# Patient Record
Sex: Female | Born: 1965
Health system: Southern US, Community
[De-identification: ages and names within clinical notes are randomized; demographics above are authoritative.]

## PROBLEM LIST (undated history)

## (undated) DIAGNOSIS — L0292 Furuncle, unspecified: Secondary | ICD-10-CM

## (undated) DIAGNOSIS — L732 Hidradenitis suppurativa: Secondary | ICD-10-CM

## (undated) DIAGNOSIS — F79 Unspecified intellectual disabilities: Secondary | ICD-10-CM

## (undated) DIAGNOSIS — K219 Gastro-esophageal reflux disease without esophagitis: Secondary | ICD-10-CM

## (undated) DIAGNOSIS — N938 Other specified abnormal uterine and vaginal bleeding: Secondary | ICD-10-CM

## (undated) DIAGNOSIS — B370 Candidal stomatitis: Secondary | ICD-10-CM

## (undated) DIAGNOSIS — E099 Drug or chemical induced diabetes mellitus without complications: Secondary | ICD-10-CM

## (undated) DIAGNOSIS — T380X5A Adverse effect of glucocorticoids and synthetic analogues, initial encounter: Secondary | ICD-10-CM

## (undated) HISTORY — PX: TONSILLECTOMY AND ADENOIDECTOMY: SHX28

## (undated) HISTORY — PX: ABDOMINAL HYSTERECTOMY: SHX81

## (undated) HISTORY — DX: Furuncle, unspecified: L02.92

## (undated) HISTORY — DX: Other specified abnormal uterine and vaginal bleeding: N93.8

## (undated) HISTORY — PX: HYDRADENITIS EXCISION: SHX5243

---

## 1998-12-31 ENCOUNTER — Emergency Department (HOSPITAL_COMMUNITY): Admission: EM | Admit: 1998-12-31 | Discharge: 1998-12-31 | Payer: Self-pay | Admitting: Internal Medicine

## 1999-07-21 ENCOUNTER — Other Ambulatory Visit: Admission: RE | Admit: 1999-07-21 | Discharge: 1999-07-21 | Payer: Self-pay | Admitting: Family Medicine

## 1999-12-30 ENCOUNTER — Inpatient Hospital Stay (HOSPITAL_COMMUNITY): Admission: AD | Admit: 1999-12-30 | Discharge: 1999-12-30 | Payer: Self-pay | Admitting: Obstetrics & Gynecology

## 1999-12-30 ENCOUNTER — Encounter: Payer: Self-pay | Admitting: Obstetrics

## 1999-12-30 ENCOUNTER — Encounter (INDEPENDENT_AMBULATORY_CARE_PROVIDER_SITE_OTHER): Payer: Self-pay | Admitting: Specialist

## 2000-01-12 ENCOUNTER — Encounter: Admission: RE | Admit: 2000-01-12 | Discharge: 2000-01-12 | Payer: Self-pay | Admitting: Obstetrics

## 2000-04-05 ENCOUNTER — Encounter: Admission: RE | Admit: 2000-04-05 | Discharge: 2000-04-05 | Payer: Self-pay | Admitting: Obstetrics

## 2000-06-09 ENCOUNTER — Emergency Department (HOSPITAL_COMMUNITY): Admission: EM | Admit: 2000-06-09 | Discharge: 2000-06-09 | Payer: Self-pay | Admitting: Emergency Medicine

## 2000-07-24 ENCOUNTER — Encounter: Admission: RE | Admit: 2000-07-24 | Discharge: 2000-07-24 | Payer: Self-pay | Admitting: Obstetrics & Gynecology

## 2000-08-27 ENCOUNTER — Ambulatory Visit (HOSPITAL_COMMUNITY): Admission: RE | Admit: 2000-08-27 | Discharge: 2000-08-27 | Payer: Self-pay | Admitting: Obstetrics & Gynecology

## 2000-10-26 ENCOUNTER — Encounter: Admission: RE | Admit: 2000-10-26 | Discharge: 2000-10-26 | Payer: Self-pay | Admitting: Obstetrics & Gynecology

## 2001-09-25 ENCOUNTER — Other Ambulatory Visit: Admission: RE | Admit: 2001-09-25 | Discharge: 2001-09-25 | Payer: Self-pay | Admitting: Obstetrics & Gynecology

## 2001-10-02 ENCOUNTER — Encounter: Payer: Self-pay | Admitting: Obstetrics & Gynecology

## 2001-10-02 ENCOUNTER — Ambulatory Visit (HOSPITAL_COMMUNITY): Admission: RE | Admit: 2001-10-02 | Discharge: 2001-10-02 | Payer: Self-pay | Admitting: Obstetrics & Gynecology

## 2003-05-19 ENCOUNTER — Ambulatory Visit (HOSPITAL_COMMUNITY): Admission: RE | Admit: 2003-05-19 | Discharge: 2003-05-19 | Payer: Self-pay | Admitting: Obstetrics & Gynecology

## 2003-11-13 ENCOUNTER — Ambulatory Visit (HOSPITAL_COMMUNITY): Admission: RE | Admit: 2003-11-13 | Discharge: 2003-11-13 | Payer: Self-pay | Admitting: Gastroenterology

## 2003-11-30 ENCOUNTER — Ambulatory Visit: Payer: Self-pay | Admitting: Family Medicine

## 2004-04-14 ENCOUNTER — Ambulatory Visit: Payer: Self-pay | Admitting: Oncology

## 2004-08-19 ENCOUNTER — Ambulatory Visit: Payer: Self-pay | Admitting: Oncology

## 2004-12-25 ENCOUNTER — Ambulatory Visit: Payer: Self-pay | Admitting: Oncology

## 2005-03-24 ENCOUNTER — Ambulatory Visit: Payer: Self-pay | Admitting: Oncology

## 2005-03-30 ENCOUNTER — Encounter (INDEPENDENT_AMBULATORY_CARE_PROVIDER_SITE_OTHER): Payer: Self-pay | Admitting: Specialist

## 2005-03-30 ENCOUNTER — Inpatient Hospital Stay (HOSPITAL_COMMUNITY): Admission: RE | Admit: 2005-03-30 | Discharge: 2005-04-02 | Payer: Self-pay | Admitting: Obstetrics & Gynecology

## 2005-05-03 ENCOUNTER — Ambulatory Visit (HOSPITAL_BASED_OUTPATIENT_CLINIC_OR_DEPARTMENT_OTHER): Admission: RE | Admit: 2005-05-03 | Discharge: 2005-05-03 | Payer: Self-pay | Admitting: General Surgery

## 2005-05-03 ENCOUNTER — Encounter (INDEPENDENT_AMBULATORY_CARE_PROVIDER_SITE_OTHER): Payer: Self-pay | Admitting: *Deleted

## 2005-06-22 ENCOUNTER — Ambulatory Visit: Payer: Self-pay | Admitting: Oncology

## 2005-06-27 LAB — CBC WITH DIFFERENTIAL/PLATELET
BASO%: 0.2 % (ref 0.0–2.0)
Basophils Absolute: 0 10*3/uL (ref 0.0–0.1)
EOS%: 0.4 % (ref 0.0–7.0)
HCT: 35.2 % (ref 34.8–46.6)
HGB: 11.8 g/dL (ref 11.6–15.9)
LYMPH%: 25.5 % (ref 14.0–48.0)
MCH: 27.9 pg (ref 26.0–34.0)
MCHC: 33.7 g/dL (ref 32.0–36.0)
MONO#: 0.2 10*3/uL (ref 0.1–0.9)
MONO%: 4.2 % (ref 0.0–13.0)
Platelets: 308 10*3/uL (ref 145–400)
RBC: 4.25 10*6/uL (ref 3.70–5.32)

## 2005-07-28 ENCOUNTER — Encounter: Admission: RE | Admit: 2005-07-28 | Discharge: 2005-07-28 | Payer: Self-pay | Admitting: General Surgery

## 2005-09-14 ENCOUNTER — Ambulatory Visit (HOSPITAL_COMMUNITY): Admission: RE | Admit: 2005-09-14 | Discharge: 2005-09-14 | Payer: Self-pay | Admitting: General Surgery

## 2005-09-14 ENCOUNTER — Encounter (INDEPENDENT_AMBULATORY_CARE_PROVIDER_SITE_OTHER): Payer: Self-pay | Admitting: Specialist

## 2005-12-22 ENCOUNTER — Ambulatory Visit: Payer: Self-pay | Admitting: Oncology

## 2005-12-26 LAB — CBC WITH DIFFERENTIAL/PLATELET
LYMPH%: 20.1 % (ref 14.0–48.0)
MONO#: 0.2 10*3/uL (ref 0.1–0.9)
NEUT%: 74.8 % (ref 39.6–76.8)
RBC: 4.32 10*6/uL (ref 3.70–5.32)
RDW: 13.8 % (ref 11.3–14.5)
WBC: 5.2 10*3/uL (ref 3.9–10.0)

## 2006-06-21 ENCOUNTER — Ambulatory Visit: Payer: Self-pay | Admitting: Oncology

## 2006-06-26 LAB — CBC WITH DIFFERENTIAL/PLATELET
EOS%: 0.6 % (ref 0.0–7.0)
Eosinophils Absolute: 0 10*3/uL (ref 0.0–0.5)
HCT: 34.9 % (ref 34.8–46.6)
HGB: 12.3 g/dL (ref 11.6–15.9)
MCH: 28.9 pg (ref 26.0–34.0)
MCHC: 35.3 g/dL (ref 32.0–36.0)
MONO#: 0.2 10*3/uL (ref 0.1–0.9)
MONO%: 5.3 % (ref 0.0–13.0)
NEUT#: 3.2 10*3/uL (ref 1.5–6.5)
Platelets: 283 10*3/uL (ref 145–400)
WBC: 4.6 10*3/uL (ref 3.9–10.0)
lymph#: 1.1 10*3/uL (ref 0.9–3.3)

## 2006-08-29 ENCOUNTER — Encounter: Admission: RE | Admit: 2006-08-29 | Discharge: 2006-08-29 | Payer: Self-pay | Admitting: Family Medicine

## 2006-09-06 ENCOUNTER — Encounter: Admission: RE | Admit: 2006-09-06 | Discharge: 2006-09-06 | Payer: Self-pay | Admitting: Family Medicine

## 2006-12-20 ENCOUNTER — Ambulatory Visit: Payer: Self-pay | Admitting: Oncology

## 2006-12-25 LAB — CBC WITH DIFFERENTIAL/PLATELET
HCT: 35.1 % (ref 34.8–46.6)
HGB: 12.3 g/dL (ref 11.6–15.9)
LYMPH%: 24.2 % (ref 14.0–48.0)
MCHC: 34.9 g/dL (ref 32.0–36.0)
MCV: 82.9 fL (ref 81.0–101.0)
MONO%: 5.7 % (ref 0.0–13.0)
lymph#: 1.2 10*3/uL (ref 0.9–3.3)

## 2007-03-23 ENCOUNTER — Emergency Department (HOSPITAL_COMMUNITY): Admission: EM | Admit: 2007-03-23 | Discharge: 2007-03-23 | Payer: Self-pay | Admitting: Emergency Medicine

## 2007-06-08 ENCOUNTER — Emergency Department (HOSPITAL_COMMUNITY): Admission: EM | Admit: 2007-06-08 | Discharge: 2007-06-08 | Payer: Self-pay | Admitting: Emergency Medicine

## 2007-06-19 ENCOUNTER — Ambulatory Visit: Payer: Self-pay | Admitting: Oncology

## 2007-09-10 ENCOUNTER — Encounter: Admission: RE | Admit: 2007-09-10 | Discharge: 2007-09-10 | Payer: Self-pay | Admitting: Family Medicine

## 2008-06-19 ENCOUNTER — Ambulatory Visit: Payer: Self-pay | Admitting: Oncology

## 2008-06-23 LAB — CBC WITH DIFFERENTIAL/PLATELET
EOS%: 1.1 % (ref 0.0–7.0)
Eosinophils Absolute: 0.1 10*3/uL (ref 0.0–0.5)
HCT: 38.1 % (ref 34.8–46.6)
HGB: 12.9 g/dL (ref 11.6–15.9)
LYMPH%: 24.8 % (ref 14.0–49.7)
MCH: 28.6 pg (ref 25.1–34.0)
MCHC: 33.9 g/dL (ref 31.5–36.0)
NEUT%: 68.9 % (ref 38.4–76.8)
Platelets: 272 10*3/uL (ref 145–400)
RBC: 4.52 10*6/uL (ref 3.70–5.45)
RDW: 14.1 % (ref 11.2–14.5)
WBC: 4.8 10*3/uL (ref 3.9–10.3)
lymph#: 1.2 10*3/uL (ref 0.9–3.3)

## 2008-09-10 ENCOUNTER — Encounter: Admission: RE | Admit: 2008-09-10 | Discharge: 2008-09-10 | Payer: Self-pay | Admitting: Family Medicine

## 2009-05-26 ENCOUNTER — Encounter: Admission: RE | Admit: 2009-05-26 | Discharge: 2009-05-26 | Payer: Self-pay | Admitting: Occupational Medicine

## 2009-06-21 ENCOUNTER — Ambulatory Visit: Payer: Self-pay | Admitting: Oncology

## 2009-06-22 LAB — CBC WITH DIFFERENTIAL/PLATELET
BASO%: 0.4 % (ref 0.0–2.0)
Basophils Absolute: 0 10*3/uL (ref 0.0–0.1)
HGB: 12.9 g/dL (ref 11.6–15.9)
MCH: 28.7 pg (ref 25.1–34.0)
MCHC: 33.7 g/dL (ref 31.5–36.0)
MCV: 85.3 fL (ref 79.5–101.0)
Platelets: 280 10*3/uL (ref 145–400)
RDW: 14.2 % (ref 11.2–14.5)
WBC: 4.8 10*3/uL (ref 3.9–10.3)

## 2009-09-20 ENCOUNTER — Encounter: Admission: RE | Admit: 2009-09-20 | Discharge: 2009-09-20 | Payer: Self-pay | Admitting: Oncology

## 2009-09-20 ENCOUNTER — Encounter: Payer: Self-pay | Admitting: Internal Medicine

## 2009-09-28 ENCOUNTER — Encounter: Payer: Self-pay | Admitting: Internal Medicine

## 2009-09-28 ENCOUNTER — Ambulatory Visit: Payer: Self-pay | Admitting: Internal Medicine

## 2009-09-28 DIAGNOSIS — L0292 Furuncle, unspecified: Secondary | ICD-10-CM | POA: Insufficient documentation

## 2009-09-28 DIAGNOSIS — L0293 Carbuncle, unspecified: Secondary | ICD-10-CM

## 2009-09-28 DIAGNOSIS — R6250 Unspecified lack of expected normal physiological development in childhood: Secondary | ICD-10-CM | POA: Insufficient documentation

## 2009-09-28 DIAGNOSIS — E669 Obesity, unspecified: Secondary | ICD-10-CM

## 2009-09-28 DIAGNOSIS — D500 Iron deficiency anemia secondary to blood loss (chronic): Secondary | ICD-10-CM | POA: Insufficient documentation

## 2009-09-28 DIAGNOSIS — D573 Sickle-cell trait: Secondary | ICD-10-CM

## 2009-10-06 ENCOUNTER — Ambulatory Visit: Payer: Self-pay | Admitting: Internal Medicine

## 2009-10-06 LAB — CONVERTED CEMR LAB
BUN: 8 mg/dL (ref 6–23)
Calcium: 8.7 mg/dL (ref 8.4–10.5)
Chloride: 102 meq/L (ref 96–112)
Cholesterol: 163 mg/dL (ref 0–200)
HCT: 38.4 % (ref 36.0–46.0)
HDL: 36 mg/dL — ABNORMAL LOW (ref >39)
Hemoglobin: 12.7 g/dL (ref 12.0–15.0)
MCHC: 33.1 g/dL (ref 30.0–36.0)
Platelets: 269 10*3/uL (ref 150–400)
Sodium: 137 meq/L (ref 135–145)
TSH: 1.08 microintl units/mL (ref 0.350–4.5)
Total CHOL/HDL Ratio: 4.5
Triglycerides: 73 mg/dL (ref <150)
VLDL: 15 mg/dL (ref 0–40)

## 2009-11-16 ENCOUNTER — Ambulatory Visit: Payer: Self-pay | Admitting: Internal Medicine

## 2009-11-16 LAB — CONVERTED CEMR LAB: Hgb A1c MFr Bld: 7.1 %

## 2009-11-30 ENCOUNTER — Encounter: Payer: Self-pay | Admitting: Internal Medicine

## 2009-11-30 ENCOUNTER — Ambulatory Visit: Payer: Self-pay | Admitting: Internal Medicine

## 2009-11-30 DIAGNOSIS — E11319 Type 2 diabetes mellitus with unspecified diabetic retinopathy without macular edema: Secondary | ICD-10-CM

## 2009-11-30 LAB — CONVERTED CEMR LAB: Blood Glucose, Fingerstick: 118

## 2009-12-09 ENCOUNTER — Encounter: Payer: Self-pay | Admitting: Internal Medicine

## 2010-02-20 ENCOUNTER — Encounter: Payer: Self-pay | Admitting: Oncology

## 2010-02-20 ENCOUNTER — Encounter: Payer: Self-pay | Admitting: Family Medicine

## 2010-03-03 NOTE — Assessment & Plan Note (Signed)
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

## 2010-03-03 NOTE — Miscellaneous (Signed)
Summary: PATIENT CONSENT FORM  PATIENT CONSENT FORM   Imported By: Louretta Parma 10/01/2009 16:33:05  _____________________________________________________________________  External Attachment:    Type:   Image     Comment:   External Document

## 2010-03-03 NOTE — Letter (Signed)
Summary: [facility]  [facility]   Imported By: Shon Hough 12/21/2009 11:50:01  _____________________________________________________________________  External Attachment:    Type:   Image     Comment:   External Document

## 2010-03-03 NOTE — Assessment & Plan Note (Signed)
Summary: CHECKUP/SB.   Vital Signs:  Patient profile:   45 year old female Height:      63.5 inches (161.29 cm) Weight:      197.8 pounds (89.41 kg) BMI:     34.42 Temp:     98.6 degrees F (37.00 degrees C) oral Pulse rate:   91 / minute BP sitting:   124 / 84  (left arm)  Vitals Entered By: Theotis Barrio NT II (November 16, 2009 2:14 PM) CC: PATIENT IS HERE WITH HER MOTHER FOR FOLLOW UP ON LAB WORK Is Patient Diabetic? No Pain Assessment Patient in pain? no      Nutritional Status BMI of > 30 = obese  Have you ever been in a relationship where you felt threatened, hurt or afraid?No   Does patient need assistance? Functional Status Self care Ambulation Normal   CC:  PATIENT IS HERE WITH HER MOTHER FOR FOLLOW UP ON LAB WORK.  History of Present Illness: Follow up on labs. Patient is brought in by her mother. Patient states that she has polyuri and urgency for a couple of months. Denies any dysuria, fever, chills, polyphagia, increased thirst or weight fluctuation. FMHx: mother with DM, type 2. Denies any dizziness, syncope, or any other Sx.  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 Single No children No sexual activity No nicotine, no alcohol, no illicit drugs. Lives with her mother.  Risk Factors: Smoking Status: never (11/16/2009)  Past medical, surgical, family and social histories (including risk factors) reviewed for relevance to current acute and chronic problems.  Past Surgical History: Reviewed history from 09/28/2009 and no changes required. TAH due to DUB in 2007 a(ovaries intact). Tonsilectomy/adenoidectomy in early childhood. Multiple furuncles I&D's  Family History: Reviewed history from 09/28/2009 and no changes required. Mother -breast cancer Dx-ed at 79 y/o; HTN; DM type 2 Father -lung cancer Dx-ed at 14 y/o (smoker) Sickle cell trait  Social History: Reviewed history from 09/28/2009 and no changes required. 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 Single No children No sexual activity No nicotine, no alcohol, no illicit drugs. Lives with her mother.  Review of Systems  The patient denies anorexia, fever, weight loss, weight gain, vision loss, decreased hearing, hoarseness, chest pain, syncope, dyspnea on exertion, peripheral edema, prolonged cough, headaches, hemoptysis, abdominal pain, severe indigestion/heartburn, hematuria, incontinence, muscle weakness, suspicious skin lesions, depression, unusual weight change, abnormal bleeding, and breast masses.    Physical Exam  General:  alert, well-developed, well-nourished, well-hydrated, appropriate dress, normal appearance, cooperative to examination, good hygiene, and overweight-appearing.  alert, well-developed, well-nourished, well-hydrated, appropriate dress, normal appearance, cooperative to  examination, and good hygiene.   Head:  normocephalic, atraumatic, and no abnormalities observed.   Eyes:  vision grossly intact and no injection.   Ears:  R ear normal and L ear normal.   Nose:  no external deformity.   Mouth:  good dentition, no gingival abnormalities, no dental plaque, and pharynx pink and moist.  good dentition, no gingival abnormalities, no dental plaque, and pharynx pink and moist.   Neck:  supple and no masses.   Chest Wall:  no deformities and no tenderness.   Lungs:  normal respiratory effort and normal breath sounds.   Heart:  normal rate, regular rhythm, and no murmur.   Abdomen:  soft, non-tender, normal bowel sounds, and no rebound tenderness.   Msk:  normal ROM.   Pulses:  pedal pulses 2+/4 bilatearally Neurologic:  alert & oriented X3.   Skin:  turgor normal, color normal, no rashes, and no suspicious lesions.   Psych:  Oriented X3, memory intact for recent and remote, normally interactive, good eye contact, not anxious appearing, and not depressed appearing.     Impression & Recommendations:  Problem # 1:  HYPERGLYCEMIA, FASTING (ICD-790.29) Assessment New HgbA1C returned at 7.1. will implement a diet and weight management regimen. Patient is to see Ms. Victory Dakin for DME/nutrition counselling. No oral hypoglycemics yet. Will follow up in 8-12 weeks.  Labs Reviewed: Creat: 0.52 (10/06/2009)     Problem # 2:  OBESITY, UNSPECIFIED (ICD-278.00) Assessment: Unchanged  Risks of obesity explained to the patient and her mother. Diet, exercise thoroughly discussed.Referred for free nutrition classes with Ms.Rriley.  Ht: 63.5 (11/16/2009)   Wt: 197.8 (11/16/2009)   BMI: 34.42 (11/16/2009)  Complete Medication List: 1)  Aleve 220 Mg Tabs (Naproxen sodium) .... Take one tablet two times a day as needed  Other Orders: Mammogram (Screening) (Mammo) T-Hgb A1C (in-house) [id number])    Orders Added: 1)  Mammogram (Screening) [Mammo] 2)  T-Hgb A1C (in-house)  [[id number]] 3)  Est. Patient Level III [45409]     Prevention & Chronic Care Immunizations   Influenza vaccine: Fluvax Non-MCR  (09/28/2009)   Influenza vaccine deferral: Not indicated  (11/16/2009)   Influenza vaccine due: 10/01/2010    Tetanus booster: 09/28/2009: Tdap   Td booster deferral: Not indicated  (11/16/2009)   Tetanus booster due: 09/29/2019    Pneumococcal vaccine: Not documented  Other Screening   Pap smear: Not documented   Pap smear action/deferral: Not indicated S/P hysterectomy  (09/28/2009)    Mammogram: Not documented   Mammogram action/deferral: Ordered  (11/16/2009)   Smoking status: never  (11/16/2009)  Diabetes Mellitus   HgbA1C: 7.1  (11/16/2009)    Eye exam: Not documented    Foot exam: Not documented   High risk foot: Not documented   Foot care education: Not documented    Urine microalbumin/creatinine ratio: Not documented  Lipids   Total Cholesterol: 163  (10/06/2009)   Lipid panel action/deferral: Lipid Panel ordered   LDL: 112  (10/06/2009)   LDL Direct: Not documented   HDL: 36  (10/06/2009)   Triglycerides: 73  (10/06/2009)   Lipid panel due: 09/29/2010  Self-Management Support :    Diabetes self-management support: Resources for patients handout  (09/28/2009)   Nursing Instructions: Schedule screening mammogram (see order)   Laboratory Results   Blood Tests   Date/Time Received: November 16, 2009 3:12 PM  Date/Time Reported: Burke Keels  November 16, 2009 3:12 PM   HGBA1C: 7.1%   (Normal Range: Non-Diabetic - 3-6%   Control Diabetic - 6-8%)

## 2010-03-03 NOTE — Assessment & Plan Note (Signed)
Summary: NEW REFERRED DR. SHERRILL RCC/DS   Vital Signs:  Patient profile:   45 year old female Height:      63.5 inches (161.29 cm) Weight:      196.7 pounds (89.41 kg) BMI:     34.42 Temp:     98.6 degrees F oral Pulse rate:   94 / minute BP sitting:   123 / 76  (right arm) Cuff size:   large  Vitals Entered By: Chinita Pester RN (September 28, 2009 3:13 PM) CC: New to clinic.  Needs primary MD.   Check-up. Is Patient Diabetic? No Pain Assessment Patient in pain? no      Nutritional Status BMI of > 30 = obese  Have you ever been in a relationship where you felt threatened, hurt or afraid?Unable to ask; somone w/pt.   Does patient need assistance? Functional Status Self care Ambulation Normal Comments Mother w/pt.   CC:  New to clinic.  Needs primary MD.   Check-up.Marland Kitchen  History of Present Illness: Patient brought in by her mother "to establish her care." C/o "boils" which  recurrent in abdominal skin folds. Hxof I&D.Patient denies any known hx of MRSA.She denies any pain,only "soreness,"no fever, chills or drainage. Denies any other concerns.  Depression History:      The patient denies a depressed mood most of the day and a diminished interest in her usual daily activities.         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 Single No children No sexual activity No nicotine, no alcohol, no illicit drugs. Lives with her mother.  Risk Factors: Alcohol Use: 0 (09/28/2009) Exercise: no (09/28/2009)  Risk Factors: Smoking Status: never (09/28/2009)  Past Surgical History: TAH due to DUB in 2007 a(ovaries intact). Tonsilectomy/adenoidectomy in early childhood. Multiple furuncles I&D's  Family History: Reviewed history and no changes required. Mother -breast cancer Dx-ed at 10 y/o; HTN; DM type 2 Father -lung cancer Dx-ed at 79 y/o (smoker) Sickle cell trait  Social History: Reviewed history and no changes required. 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 Single No children No sexual activity No nicotine, no alcohol, no illicit drugs. Lives with her mother.Smoking Status:  never Does Patient Exercise:  no  Review of Systems  The patient denies anorexia, fever, weight loss, weight gain, vision loss, decreased hearing, hoarseness, chest pain, syncope, dyspnea on exertion, peripheral edema, prolonged cough, headaches, hemoptysis, abdominal pain, melena, hematochezia, severe indigestion/heartburn, hematuria, incontinence, genital sores, muscle weakness, suspicious skin lesions, transient blindness, difficulty walking, depression, unusual  weight change, abnormal bleeding, enlarged lymph nodes, angioedema, and breast masses.    Physical Exam  General:  alert, well-developed, well-nourished, well-hydrated, appropriate dress, normal appearance, cooperative to examination, good hygiene, and overweight-appearing.  alert,  well-developed, well-nourished, well-hydrated, appropriate dress, normal appearance, cooperative to examination, and good hygiene.   Head:  normocephalic, no abnormalities observed, and no abnormalities palpated.  normocephalic, no abnormalities observed, and no abnormalities palpated.   Eyes:  vision grossly intact, pupils equal, pupils round, pupils reactive to light, pupils react to accomodation, and corneas and lenses clear.  vision grossly intact, pupils equal, pupils round, pupils reactive to light, pupils react to accomodation, and corneas and lenses clear.   Ears:  R ear normal and L ear normal.  R ear normal and L ear normal.   Nose:  no external deformity, no nasal discharge, no mucosal pallor, no mucosal edema, and no nasal polyps.  no external deformity, no nasal discharge, no mucosal pallor, no mucosal edema, and no nasal polyps.   Mouth:  good dentition, no gingival abnormalities, no dental plaque, and pharynx pink and moist.  good dentition, no gingival abnormalities, no dental plaque, and pharynx pink and moist.   Neck:  supple, full ROM, and no masses.  supple, full ROM, and no masses.   Chest Wall:  no deformities.  no deformities.   Breasts:  deferred. Mammogram as of 8/22/2011WNL. Lungs:  Normal respiratory effort, chest expands symmetrically. Lungs are clear to auscultation, no crackles or wheezes. Heart:  Normal rate and regular rhythm. S1 and S2 normal without gallop, murmur, click, rub or other extra sounds. Abdomen:  Bowel sounds positive,abdomen soft and non-tender without masses, organomegaly or hernias noted. Msk:  No deformity or scoliosis noted of thoracic or lumbar spine.   Pulses:  R and L carotid,radial,femoral,dorsalis pedis and posterior tibial pulses are full and equal bilaterally Extremities:  No clubbing, cyanosis, edema, or deformity noted with normal full range of motion of all joints.   Neurologic:  No cranial nerve deficits noted. Station and gait are normal.  Plantar reflexes are down-going bilaterally. DTRs are symmetrical throughout. Sensory, motor and coordinative functions appear intact. Skin:  Left breast foldwith a 0.5cm indiamter hyperpigmented indurated papule; mildly TTP; no discharge; turgor normal, color normal, no rashes, and no edema.  turgor normal, color normal, no rashes, and no edema.   Cervical Nodes:  No lymphadenopathy noted Axillary Nodes:  No palpable lymphadenopathy Psych:  Cognition and judgment appear intact. Alert and cooperative with normal attention span and concentration. 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 Doxycyline by mouth for 10 days. Skin care discussed with the patient.  Problem # 2:  Preventive Health Care (ICD-V70.0) Assessment: Comment Only Weight management, diet,exercise, seat belt use discussed with the patient. Tdap and Influenzavaccination today. will erquest copy of a recent mammogram(FMHx of BCA). C-met, Lipids,CBC ordered.   Problem # 3:  FAMILY HISTORY OF DIABETES MELLITUS (ICD-V18.0) Assessment: New  Will check TSH and a B-met. Weight management discussed.   Orders: T-Basic Metabolic Panel [phone])  Complete Medication List: 1)  Aleve 220 Mg Tabs (Naproxen sodium) .... Take one tablet two times a day as needed 2)  Doxycycline Hyclate 100 Mg Caps (Doxycycline hyclate) .... Take 1 tablet by mouth two times a day with meals  Other Orders: T-Lipid Profile ([id number]) T-CBC No Diff ([id number]) T-TSH ([id number]) Influenza Vaccine NON MCR (27253) Tdap =>  67yrs IM [phone]) Admin 1st Vaccine (60454)  Patient Instructions: 1)  Please, return toclinic a for your blood work on an empty stomach. 2)  Please, pick up your antibiotic prescription at the [facility] pharmacy. 3)  Call with any questions. 4)  Please, follow up in  4-8weeks. Prescriptions: DOXYCYCLINE HYCLATE 100 MG CAPS (DOXYCYCLINE HYCLATE) Take 1 tablet by mouth two times a day with meals  #20 x 0   Entered and Authorized by:   Deatra Robinson MD   Signed by:   Deatra Robinson MD on 09/28/2009   Method used:   Electronically to        [facility] [address] [phone]* (retail)       [address] [place], [place]  [address]       Ph: [phone]       Fax: [phone]   RxID:   [id number]   Prevention & Chronic Care Immunizations   Influenza vaccine: Fluvax Non-MCR  (09/28/2009)    Tetanus booster: 09/28/2009: Tdap   Tetanus booster due: 09/29/2019    Pneumococcal vaccine: Not documented  Other Screening   Pap smear: Not documented   Pap smear action/deferral: Not indicated S/P hysterectomy  (09/28/2009)    Mammogram: Not documented  Reports requested:   Last mammogram report requested.  Smoking status: never  (09/28/2009)  Lipids   Total Cholesterol: Not documented   Lipid panel action/deferral: Lipid Panel ordered   LDL: Not documented   LDL Direct: Not documented   HDL: Not documented   Triglycerides: Not documented   Lipid panel due: 09/29/2010      Resource handout printed.   Nursing Instructions: Give Flu vaccine today Give tetanus booster today Request report of last mammogram   Process Orders Check Orders Results:     [facility]: ABN not required for this insurance Tests Sent for requisitioning (September 30, 2009 8:31 AM):     09/28/2009: [facility] -- T-Lipid Profile [phone] (signed)     09/28/2009: [facility] -- T-CBC No Diff [[id number]] (signed)     09/28/2009: [facility] -- T-Basic Metabolic Panel [id number] (signed)     09/28/2009: [facility] -- T-Basic Metabolic Panel [phone] (signed)     09/28/2009: [facility] -- T-TSH [phone] (signed)     Tetanus/Td  Vaccine    Vaccine Type: Tdap    Site: right deltoid    Mfr: GlaxoSmithKline    Dose: 0.5 ml    Route: IM    Given by: Chinita Pester RN    Exp. Date: 04/24/2011    Lot #: [id number]    VIS given: 12/18/06 version given September 28, 2009.  Influenza Vaccine    Vaccine Type: Fluvax Non-MCR    Site: left deltoid    Mfr: GlaxoSmithKline    Dose: 0.5 ml    Route: IM    Given by: Chinita Pester RN    Exp. Date: 07/30/2010    Lot #: [id number]    VIS given: 08/23/06 version given September 28, 2009.  Flu Vaccine Consent Questions    Do you have a history of severe allergic reactions to this vaccine? no    Any prior history of allergic reactions to egg and/or gelatin? no    Do you have a sensitivity to the preservative Thimersol? no    Do you have a past history of Guillan-Barre Syndrome? no    Do you currently have an acute febrile illness?  no    Have you ever had a severe reaction to latex? no    Vaccine information given and explained to patient? yes    Are you currently pregnant? no

## 2010-03-03 NOTE — Assessment & Plan Note (Signed)
Summary: DM TEACHING/CH  CBG Result 118 Comments 3 hours after lunch   Allergies: No Known Drug Allergies   Complete Medication List: 1)  Aleve 220 Mg Tabs (Naproxen sodium) .... 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 water- used to drink sweet tea & fruit punch  Dinner # of Carbs/Grams baked meat, rice or potatoes and vegetables. water, diet soda Bedtime # of Carbs/Grams greek yogurt  Nutrition assessment ETOH : No How many meals per week do you eat away from home?   3-5 Who does the food shopping? Other- mother Who does the cooking?  Other- mother What beverages do you drink?  water-- just switched as above Biggest challenge to eating healthy: Skipping meals  Activity Limitations  Inadequate physical activity Diabetes Disease Process  Discussed today State own type of diabetes: Needs review/assistanceState diabetes is treated by meal plan-exercise-medication-monitoring-education: Needs  review/assistance Medications  Nutritional Management Identify what foods most often affect blood glucose: Needs review/assistanceVerbalize importance of controlling food portions: No knowledgeState importance of spacing and not omitting meals and snacks: No knowledgeState changes planned for home meals/snacks: Needs review/assistance Monitoring State purpose and frequency of monitoring BG-ketones-HgbA1C  : Needs review/assistancePerform glucose monitoring/ketone testing and record results correctly: Needs review/assistanceState target blood glucose and HgbA1C goals: Needs review/assistance Complications State the causes-signs and symptoms and prevention of Hyperglycemia: Needs review/assistance   Explain proper treatment of hyperglycemia: Needs review/assistance Exercise States importance of exercise: Needs review/assistanceStates effect of exercise on blood glucose: No knowledge Lifestyle changes:Goal setting and Problem solving State benefits of making appropriate lifestyle changes: Demonstrates competency   Identify lifestyle behaviors that need to change: Needs review/assistance   Identify risk factors that interfere with health: Needs review/assistance   Develop strategies to reduce risk factors: Needs review/assistance   Verbalize need for and frequency of health care follow-up: Needs review/assistance   Identify Family/SO role in managing diabetes: Needs review/assistance    Psychosocial Adjustment State three common feelings that might be experienced when learning to cope with diabetes: No knowledge   Identify two methods to cope with these feelings: No knowledge   Identify how stress affects diabetes & two sources of stress: Needs review/assistanceName two ways of obtaining support from family/friends: No knowledgeDiabetes Management Education Done: 11/30/2009    BEHAVIORAL GOALS INITIAL Incorporating appropriate nutritional management: eat something every morning         Diabetes Self Management Support: mother, family, clinic staff Follow-up:3-4 weeks

## 2010-05-25 ENCOUNTER — Encounter: Payer: Self-pay | Admitting: Internal Medicine

## 2010-06-17 NOTE — Op Note (Signed)
NAMEBEAULAH, Jaime [name]               ACCOUNT NO.:  [id number]   MEDICAL RECORD NO.:  [id number]          PATIENT TYPE:  AMB   LOCATION:  SDS                          FACILITY:  [facility]   PHYSICIAN:  Ollen Gross. Vernell Morgans, M.D. DATE OF BIRTH:  [birth year]   DATE OF PROCEDURE:  09/14/2005  DATE OF DISCHARGE:  09/14/2005                                 OPERATIVE REPORT   PREOPERATIVE DIAGNOSIS:  Sebaceous cyst from the left inframammary fold.   POSTOPERATIVE DIAGNOSIS:  Sebaceous cyst from the left inframammary fold.   PROCEDURE:  Excision of sebaceous cyst.   SURGEON:  Ollen Gross. Carolynne Edouard, M.D.   ANESTHESIA:  General via LMA.   PROCEDURE:  After informed consent was obtained, the patient was brought to  the operating room and placed in the supine position on the operating room  table.  After adequate induction of general anesthesia, the patient's left  breast area was prepped with Betadine and draped in the usual sterile  manner.  The area of the cyst was identifiable in the left inframammary  fold.  This area was infiltrated with 0.25% Marcaine with epinephrine.  An  elliptical incision was made around this area with a 15-blade knife.  The  area was removed sharply down to the subcutaneous fat, all done sharply with  the 15-blade knife.  Hemostasis was achieved using the Bovie electrocautery.  The specimen was sent to Pathology for further evaluation.  The wound was  then closed with a running 4-0 Monocryl subcuticular stitch.  Benzoin, Steri-  Strips and sterile dressings were applied.  The patient tolerated the  procedure well.  At the end of the case, all needle, sponge and instrument  counts were correct.  The patient was then awakened and taken to recovery  room in stable condition.      Ollen Gross. Vernell Morgans, M.D.  Electronically Signed     PST/MEDQ  D:  09/15/2005  T:  09/15/2005  Job:  [id number]

## 2010-06-17 NOTE — Op Note (Signed)
NAMETEONNA, COONAN               ACCOUNT NO.:  [id number]   MEDICAL RECORD NO.:  [id number]          PATIENT TYPE:  AMB   LOCATION:  ENDO                         FACILITY:  [facility]   PHYSICIAN:  Anselmo Rod, M.D.  DATE OF BIRTH:  [birth year]   DATE OF PROCEDURE:  11/13/2003  DATE OF DISCHARGE:                                 OPERATIVE REPORT   PROCEDURE:  Colonoscopy.   ENDOSCOPIST:  Charna Elizabeth, M.D.   INSTRUMENT USED:  Olympus video colonoscope.   INDICATIONS FOR PROCEDURE:  45 year old African American female with a  history of sickle cell trait and mild iron deficiency anemia undergoing  colonoscopy for rectal bleeding.  Rule out colonic polyps, masses, etc.   PREPROCEDURE PREPARATION:  Informed consent was procured from the patient.  The patient was fasted for eight hours prior to the procedure and prepped  with a bottle of magnesium citrate and a gallon of GoLYTELY the night prior  to the procedure.   PREPROCEDURE PHYSICAL:  Patient with stable vital signs.  Neck supple.  Chest clear to auscultation.  S1 and S2 regular.  Abdomen soft with normal  bowel sounds.   DESCRIPTION OF PROCEDURE:  The patient was placed in the left lateral  decubitus position, sedated with 80 mg of Demerol and 8 mg Versed in slow  incremental doses.  Once the patient was adequately sedated, maintained on  low flow oxygen and continuous cardiac monitoring, the Olympus video  colonoscope was advanced from the rectum to the cecum with difficulty.  There was a large amount of residual stool in the colon.  Multiple washes  were done.  Prominent anal papilla was seen on retroflexion.  Extrinsic  compression of the cecum was noted, the reason for this is not clear.  The  terminal ileum appeared heathy.  Clots were noted passing through the vagina  before starting the procedure.  No masses or polyps were identified.  The  patient tolerated the procedure well without complications.   IMPRESSION:  1.   Extrinsic compression of the cecum, cause unclear.  2.  Prominent anal papilla seen on retroflexion.  3.  Clots seen in the vaginal area.  4.  Large amount of residual stool in the colon, small lesions could have      been missed.  5.  Small anal fissure seen on anal inspection.   RECOMMENDATIONS:  1.  Continue care for anal fissure as advised in the office.  2.  CT scan of the abdomen and pelvis to further evaluate the extrinsic      compression on the cecum.  3.  Outpatient follow up in the next two weeks, earlier if need be.       JNM/MEDQ  D:  11/13/2003  T:  11/13/2003  Job:  [id number]   cc:   Leighton Roach. Truett Perna, M.D.  [address]- [facility]  [place]  [place] [address]  Fax: [phone]

## 2010-06-17 NOTE — H&P (Signed)
Jaime [name], Jaime [name]               ACCOUNT NO.:  [id number]   MEDICAL RECORD NO.:  [id number]          PATIENT TYPE:  INP   LOCATION:  NA                            [facility]   PHYSICIAN:  Roseanna Rainbow, M.D.DATE OF BIRTH:  10-15-1965   DATE OF ADMISSION:  DATE OF DISCHARGE:                                HISTORY & PHYSICAL   CHIEF COMPLAINT:  The patient is a 45 year old with symptomatic uterine  fibroids and secondary dysmenorrhea, who presents for total abdominal  hysterectomy, possible bilateral salpingo-oophorectomy.   HISTORY OF PRESENT ILLNESS:  The patient has a long history of menorrhagia.  She also has a history of secondary iron-deficiency anemia. Attempts have  been made to medically manage the patient with Depo-Provera. Work up has  included a pelvic ultrasound that demonstrated uterine fibroids. A  hemoglobin on February 21, 2005 was 9.9.   PAST OB/GYN HISTORY:  Please see the above. Last Pap smear in October of  2006 was within normal limits. She does have a history of an ASCIS Pap  smear. She is status post a bilateral tubal ligation.   PAST MEDICAL HISTORY:  Remarkable for mental retardation.   PAST SURGICAL HISTORY:  Tonsils and adenoids.   PAST OBSTETRICAL HISTORY:  She has been pregnant x2. She has had two  miscarriages.   SOCIAL HISTORY:  She is single. Employed as a Arboriculturist. Has no significant  smoking history. Does not give any significant history of alcohol usage. She  denies illicit drug use.   FAMILY HISTORY:  Mother with breast cancer, and adult onset diabetes  mellitus.   PHYSICAL EXAMINATION:  VITAL SIGNS:  Pulse 106, blood pressure 123/79.  GENERAL:  Well-developed African-American female in no apparent distress.  LUNGS:  Clear to auscultation bilaterally.  HEART:  Regular rate and rhythm.  ABDOMEN:  Soft, nontender.  PELVIC EXAM:  Normal EGBUS. Speculum - vagina is clean. Bimanual exam -  uterus is mildly enlarged, irregular and  mildly tender.   ASSESSMENT:  Symptomatic uterine fibroids refractory to attempts at medical  management. Also with secondary dysmenorrhea. Differential diagnosis  includes adenomyosis or possible endometriosis.   PLAN:  The planned procedure is total abdominal hysterectomy, possible  bilateral salpingo-oophorectomy if there is endometriosis or if there are  endometriotic implants found at the time of surgery. The risks, benefits and  alternative forms of management were reviewed with the patient and informed  consent has been obtained.      Roseanna Rainbow, M.D.  Electronically Signed     LAJ/MEDQ  D:  03/29/2005  T:  03/29/2005  Job:  [id number]

## 2010-06-17 NOTE — H&P (Signed)
[facility]  Patient:    Jaime [name], Jaime [name]                      MRN: [id number] Adm. Date:  [id number] Disc. Date: [id number] Attending:  Antionette Char                         History and Physical  CHIEF COMPLAINT:              The patient is a 45 year old para 1 who desires a sterilization procedure.  HISTORY OF PRESENT ILLNESS:  As above.  The risks, benefits and alternative forms of management were reviewed with the patient including, but not limited to, a failure rate of 4 to 8 per 1000 cases with a subsequent increased risk of an ectopic pregnancy.  MEDICATIONS:                  None.  ALLERGIES:                    None.  PAST OBSTETRICAL/GYNECOLOGICAL HISTORY:  She is status post one delivery and one spontaneous Ab.  MEDICAL HISTORY:              She has sickle cell trait.  PAST SURGICAL HISTORY:        Remarkable for a tonsillectomy.  SOCIAL HISTORY:               Moderately handicapped, slight mental retardation.  FAMILY HISTORY:               Breast cancer.  PHYSICAL EXAMINATION:  VITAL SIGNS:                  Temperature 97.9, pulse 80, respirations 20, blood pressure 123/64.  GENERAL APPEARANCE:           Well-developed, well-nourished, in no apparent distress.  HEENT:                        Normocephalic, atraumatic.  NECK:                         Supple.  LUNGS:                        Clear to auscultation.  HEART:                        Regular rate and rhythm.  ABDOMEN:                      Soft, nontender.  No organomegaly.  PELVIC:                       External female genitalia are normal appearing. On speculum exam, the vagina is clean.  Bimanual exam:  The uterus is small, anteverted and nontender.  The adnexa are nonpalpable, nontender.  ASSESSMENT:                   Primipara who desires sterilization procedure.  PLAN:                         Laparoscopic bilateral tubal ligation. DD:  09/10/00 TD:   09/10/00 Job: [id number]

## 2010-06-17 NOTE — Op Note (Signed)
Jaime [name], Jaime [name]               ACCOUNT NO.:  [id number]   MEDICAL RECORD NO.:  [id number]          PATIENT TYPE:  INP   LOCATION:  9399                          [facility]ILITY[facility]  [facility]   PHYSICIAN:  Roseanna Rainbow, M.D.DATE OF BIRTH:  1965-06-20   DATE OF PROCEDURE:  03/30/2005  DATE OF DISCHARGE:                                 OPERATIVE REPORT   PREOPERATIVE DIAGNOSIS:  Uterine fibroids with secondary menorrhagia, pelvic  pain, rule out adenomyosis.   POSTOPERATIVE DIAGNOSIS:  Uterine fibroids with secondary menorrhagia,  pelvic pain, rule out adenomyosis.   PROCEDURE:  Total abdominal hysterectomy.   SURGEON:  Roseanna Rainbow, M.D.   ASSISTANT:  Bing Neighbors. Clearance Coots, M.D.   ANESTHESIA:  General endotracheal.   PATHOLOGY:  Uterus and cervix.   ESTIMATED BLOOD LOSS:  150 mL.   COMPLICATIONS:  None.   IV fluids and urine output as per anesthesiology.   PROCEDURES:  The patient was taken to the operating room with IV running.  She was given general anesthesia and placed in a dorsal lithotomy position  and prepped and draped in the usual sterile fashion.  A Pfannenstiel skin  incision was then made with a scalpel, carried down to the underlying  fascia.  The fascia was nicked in the midline.  The fascial incision was  then extended bilaterally with curved Mayo scissors.  The superior aspect of  the fascial incision was then tented up and the underlying rectus muscles  dissected off.  The inferior aspect of the fascial incision was manipulated  in a similar fashion.  The rectus muscles were separated in the midline.  The parietal peritoneum was entered bluntly.  The parietal peritoneal  incision was then extended superiorly and inferiorly with good visualization  of the bladder.  An O'Connor-O'Sullivan retractor was then placed into the  incision.  The bowel was packed away with moistened laparotomy sponges.  There were some filmy adhesions involving the sigmoid colon  and the left  adnexa.  These were divided with cautery.  A survey of the pelvic anatomy  revealed the uterus was globular and boggy, approximately 12 cm in sagittal  diameter.  The ovaries and tubes were normal-appearing.  The peritoneal  surfaces were free of any endometriotic implants.  Long Kelly clamps were  placed on the cornua for retraction.  The round ligaments on both sides were  divided with the Bovie.  The peritoneal reflection was then divided  anteriorly with the Bovie.  The bladder was dissected off the lower uterine  segment and cervix.  The utero-ovarian ligament and fallopian tube were then  doubly clamped bilaterally and transected.  Free ligatures and suture  ligatures were placed.  The uterine vessels were then clamped and transected  and suture ligated bilaterally.  At this point the uterine fundus was  amputated above the uterine pedicle.  The cardinal ligaments and uterosacral  ligaments were clamped with parametrial clamps, transected and suture  ligated.  The vaginal cuff angles were transected and suture ligated as  well.  The cervix was amputated.  The remainder of the vaginal cuff  was then  suture ligated with interrupted sutures of 0 Vicryl.  The pelvis was  copiously irrigated.  Individual bleeding points were cauterized with the  Bovie.  Adequate hemostasis was assured.  The packing and retractor were  then removed.  The parietal peritoneum was reapproximated in a running  fashion using 2-0 Monocryl.  The fascia was reapproximated in a running  fashion using 0 PDS.  The skin was reapproximated with staples.  At the  close of the procedure, the instrument and pack counts were said to be  correct x2.  The patient was taken to the PACU awake and in stable  condition.      Roseanna Rainbow, M.D.  Electronically Signed     LAJ/MEDQ  D:  03/30/2005  T:  03/30/2005  Job:  [id number]

## 2010-06-17 NOTE — Discharge Summary (Signed)
Jaime [name], Jaime [name]               ACCOUNT NO.:  [id number]   MEDICAL RECORD NO.:  [id number]          PATIENT TYPE:  INP   LOCATION:  9310                          [facility]   PHYSICIAN:  Roseanna Rainbow, M.D.DATE OF BIRTH:  [birth year]   DATE OF ADMISSION:  03/30/2005  DATE OF DISCHARGE:  04/02/2005                                 DISCHARGE SUMMARY   CHIEF COMPLAINT:  The patient is a 45 year old with symptomatic uterine  fibroids secondary to dysmenorrhea, who presents for a total abdominal  hysterectomy and possible bilateral salpingo-oophorectomy.  Please see the  dictated history and physical for further details.   HOSPITAL COURSE:  The patient was admitted and underwent a total abdominal  hysterectomy.  Please see the dictated operative summary.  Her postoperative  course was uneventful.  She was discharged to home on postoperative day #3  tolerating a regular diet.   DISCHARGE DIAGNOSIS:  Uterine fibroids.   PROCEDURE:  Total abdominal hysterectomy.   CONDITION:  Good.   DIET:  Regular.   ACTIVITY:  Pelvic rest and progressive activity.   MEDICATIONS:  Tylox.   DISPOSITION:  The patient was to follow up in the office in 4 weeks.      Roseanna Rainbow, M.D.  Electronically Signed     LAJ/MEDQ  D:  04/20/2005  T:  04/20/2005  Job:  [id number]

## 2010-06-17 NOTE — Op Note (Signed)
[facility]  Patient:    Jaime [name], Jaime [name]                      MRN: [id number] Proc. Date: 08/27/00 Adm. Date:  [id number] Attending:  Antionette Char CC:         [facility]   Operative Report  PREOPERATIVE DIAGNOSIS:       Multiparity, desires permanent sterilization.  POSTOPERATIVE DIAGNOSES:      1. Multiparity, desires permanent sterilization.  OPERATION:                    Laparoscopic tubal ligation with bipolar cautery.  SURGEON:                      Roseanna Rainbow, M.D.  ANESTHESIA:                   General endotracheal.  COMPLICATIONS:                None.  ESTIMATED BLOOD LOSS:         Less than 25 cc.  FLUIDS:                       As per anesthesiology.  FINDINGS:                     At laparoscopy, the uterus appeared symmetrically enlarged, globular.  The remainder of the abdominopelvic viscera appeared grossly within normal limits.  DESCRIPTION OF PROCEDURE:     The patient was taken to the operating room where general anesthesia was obtained without difficulty.  The patient was examined under general anesthesia and found to have a small anteverted uterus with normal adnexa.  She was then placed in the dorsolithotomy position and prepped and draped in the usual sterile fashion.  Bivalve speculum was then placed in the patients vagina, and a Hulka probe and tenaculum were then advanced into the uterus and secured to the anterior lip of the cervix as means to manipulate the uterus.  The speculum was removed from the vagina.  Attention was then turned to the patients abdomen where the infraumbilical skin as infiltrated with 0.25% Marcaine solution.  A 10 mm skin incision was then made in the umbilical fold.  The Veress needle was then introduced into the abdominal cavity while tenting up the abdominal wall at a 45 degree angle. Intra-abdominal placement was confirmed by an appropriate drop in  CO2 gas and by instilling normal saline through the Veress needle.  A pneumoperitoneum was obtained with 4 liters of CO2 gas.  The trocar and sleeve were then advanced without difficulty into the abdomen where intra-abdominal placement was confirmed by the laparoscope.  Survey of the patients pelvis and abdomen revealed the above findings.  The bipolar cautery apparatus was then advanced through the operative port of the laparoscope.  The patients left fallopian tube was identified, followed out to the fimbriated end.  A 2 to 3 cm segment of tube approximately 4 cm from the uterine cornu was then cauterized contiguously.  With each application, the ohmmeter was noted to go to 0.  The right fallopian tube was manipulated in a similar fashion.  The instruments were then removed from the patients abdomen and the incision repaired with 0 Vicryl.   The dilator and tenaculum were then removed from the vagina with minimal bleeding noted from the  cervix.  The patient tolerated the procedure well.  Sponge, laparotomy pad, needle and instrument counts were correct x 2. She was taken to the PACU in stable condition. DD:  08/27/00 TD:  08/27/00 Job: [id number]

## 2010-06-17 NOTE — H&P (Signed)
[facility]  Patient:    Jaime [name], Jaime [name]                      MRN: [id number] Adm. Date:  [id number] Disc. Date: [id number] Attending:  Tobey Bride CC:         [facility]   History and Physical  CHIEF COMPLAINT:              The patient is a 45 year old para 0 who desires permanent sterilization procedure.  HISTORY OF PRESENT ILLNESS:   The patient has a history of Depo-Provera use.  ALLERGIES:                    No known drug allergies.  MEDICATIONS:                  None.  PAST GYNECOLOGICAL HISTORY:   She denies any sexually transmitted diseases. Pap smear by report in November 2001 was within normal limits.  PAST OBSTETRICAL HISTORY:     She has a history of one voluntary termination of pregnancy complicated by retained products of conception and a followup D&C.  PAST MEDICAL HISTORY:         Remarkable for slight mental retardation, but as per the patients mother she is competent.  PAST SURGICAL HISTORY:        Remarkable for a history of tonsillectomy.  PHYSICAL EXAMINATION  VITAL SIGNS:                  Pulse 83, blood pressure 115/84, weight 191.8 pounds, height 5 feet 3 inches.  GENERAL:                      Obese.  In no apparent distress.  HEENT:                        Normocephalic, atraumatic.  NECK:                         Supple.  LUNGS:                        Clear to auscultation bilaterally.  HEART:                        Regular rate and rhythm.  ABDOMEN:                      Obese, nontender, soft.  PELVIC:                       The external female genitalia are normal appearing.  On speculum examination there is scant heme.  No lesions noted. On bimanual examination the uterus is small, anteverted, nontender.  The adnexa are not palpable, nontender.  EXTREMITIES:                  No cyanosis, clubbing, or edema.  SKIN:                         Without rash.  ASSESSMENT:                    Nullipara who desires sterilization procedure. The risks, benefits, and alternative forms of management were reviewed with the  patient including, but not limited to, failure rate of 4-8 per 1000 with a subsequent increased risk of an ectopic gestation.  PLAN:                         Will check the Pap smear performed at the [facility] from November 2001.  Planned procedure is laparoscopic bilateral tubal ligation. DD:  07/24/00 TD:  07/24/00 Job: [id number]

## 2010-06-17 NOTE — Op Note (Signed)
NAMEPAYETON, Jaime [name]               ACCOUNT NO.:  [id number]   MEDICAL RECORD NO.:  [id number]          PATIENT TYPE:  AMB   LOCATION:  DSC                          FACILITY:  [facility]   PHYSICIAN:  Ollen Gross. Vernell Morgans, M.D. DATE OF BIRTH:  January 25, 1966   DATE OF PROCEDURE:  05/04/2005  DATE OF DISCHARGE:  05/03/2005                                 OPERATIVE REPORT   PREOPERATIVE DIAGNOSIS:  Mass of the right chest wall.   POSTOP DIAGNOSIS:  Sebaceous cyst the right chest wall.   PROCEDURE:  Excision of sebaceous cyst from right chest wall.   SURGEON:  Dr. Carolynne Edouard.   ANESTHESIA:  General via LMA.   PROCEDURE:  After informed consent was obtained, the patient was brought to  the operating room, placed in supine position on the table.  After induction  of general anesthesia, the patient's right chest wall was prepped with  Betadine and draped in usual sterile manner.  The area in question was  identified and elliptical incision was made with a 15 blade knife around  this area.  This incision was carried down through the skin and subcutaneous  tissue sharply with the electrocautery.  The wall of the sebaceous cyst was  excised also sharply with the specimen completely with the electrocautery.  Once the specimen was completely removed from the rest of subcutaneous  tissue.  The specimen was sent to pathology for further evaluation.  The  wound was then irrigated with copious amounts of saline.  Hemostasis was  achieved using Bovie electrocautery.  The wound was closed with a deep layer  of interrupted 3-0 Vicryl stitches and the skin was closed with a running 4-  0 Monocryl subcuticular stitch.  Benzoin, Steri-Strips and sterile dressings  were applied.  The patient tolerated well.  The wound was then infiltrated  also with 4% Marcaine with epinephrine and sterile dressings were applied.  The patient was awakened, taken to recovery in stable condition.      Ollen Gross. Vernell Morgans, M.D.  Electronically Signed     PST/MEDQ  D:  05/04/2005  T:  05/05/2005  Job:  [id number]

## 2010-07-04 ENCOUNTER — Other Ambulatory Visit: Payer: Self-pay | Admitting: Oncology

## 2010-07-04 ENCOUNTER — Encounter (HOSPITAL_BASED_OUTPATIENT_CLINIC_OR_DEPARTMENT_OTHER): Payer: Medicaid Other | Admitting: Oncology

## 2010-07-04 DIAGNOSIS — D573 Sickle-cell trait: Secondary | ICD-10-CM

## 2010-07-04 DIAGNOSIS — R1310 Dysphagia, unspecified: Secondary | ICD-10-CM

## 2010-07-04 DIAGNOSIS — D509 Iron deficiency anemia, unspecified: Secondary | ICD-10-CM

## 2010-07-04 LAB — CBC WITH DIFFERENTIAL/PLATELET
Basophils Absolute: 0 10*3/uL (ref 0.0–0.1)
EOS%: 1.3 % (ref 0.0–7.0)
Eosinophils Absolute: 0.1 10*3/uL (ref 0.0–0.5)
HGB: 12.4 g/dL (ref 11.6–15.9)
MONO%: 3.1 % (ref 0.0–14.0)
RBC: 4.42 10*6/uL (ref 3.70–5.45)
WBC: 4.8 10*3/uL (ref 3.9–10.3)
lymph#: 1.4 10*3/uL (ref 0.9–3.3)

## 2010-08-16 ENCOUNTER — Other Ambulatory Visit: Payer: Self-pay | Admitting: Gastroenterology

## 2010-08-23 ENCOUNTER — Ambulatory Visit
Admission: RE | Admit: 2010-08-23 | Discharge: 2010-08-23 | Disposition: A | Discharge status: Home / Self Care | Payer: Medicaid Other | Source: Ambulatory Visit | Attending: Gastroenterology | Admitting: Gastroenterology

## 2010-09-07 ENCOUNTER — Other Ambulatory Visit: Payer: Self-pay | Admitting: Oncology

## 2010-09-07 DIAGNOSIS — Z1231 Encounter for screening mammogram for malignant neoplasm of breast: Secondary | ICD-10-CM

## 2010-09-27 ENCOUNTER — Ambulatory Visit
Admission: RE | Admit: 2010-09-27 | Discharge: 2010-09-27 | Disposition: A | Discharge status: Home / Self Care | Payer: Medicaid Other | Source: Ambulatory Visit | Attending: Oncology | Admitting: Oncology

## 2010-09-27 DIAGNOSIS — Z1231 Encounter for screening mammogram for malignant neoplasm of breast: Secondary | ICD-10-CM

## 2010-10-04 ENCOUNTER — Other Ambulatory Visit: Payer: Self-pay | Admitting: Oncology

## 2010-10-04 ENCOUNTER — Encounter (HOSPITAL_BASED_OUTPATIENT_CLINIC_OR_DEPARTMENT_OTHER): Payer: Medicaid Other | Admitting: Oncology

## 2010-10-04 DIAGNOSIS — R1310 Dysphagia, unspecified: Secondary | ICD-10-CM

## 2010-10-04 DIAGNOSIS — D573 Sickle-cell trait: Secondary | ICD-10-CM

## 2010-10-04 DIAGNOSIS — D509 Iron deficiency anemia, unspecified: Secondary | ICD-10-CM

## 2010-10-04 LAB — CBC WITH DIFFERENTIAL/PLATELET
EOS%: 0.6 % (ref 0.0–7.0)
Eosinophils Absolute: 0 10*3/uL (ref 0.0–0.5)
HGB: 12.2 g/dL (ref 11.6–15.9)
LYMPH%: 33 % (ref 14.0–49.7)
MCHC: 34.9 g/dL (ref 31.5–36.0)
MONO#: 0.2 10*3/uL (ref 0.1–0.9)
MONO%: 5 % (ref 0.0–14.0)
RBC: 4.35 10*6/uL (ref 3.70–5.45)
RDW: 13.1 % (ref 11.2–14.5)
lymph#: 1.5 10*3/uL (ref 0.9–3.3)

## 2010-11-01 ENCOUNTER — Encounter: Payer: Self-pay | Admitting: Internal Medicine

## 2010-11-01 ENCOUNTER — Ambulatory Visit (INDEPENDENT_AMBULATORY_CARE_PROVIDER_SITE_OTHER): Payer: Medicaid Other | Admitting: Internal Medicine

## 2010-11-01 DIAGNOSIS — Z23 Encounter for immunization: Secondary | ICD-10-CM

## 2010-11-01 DIAGNOSIS — E119 Type 2 diabetes mellitus without complications: Secondary | ICD-10-CM

## 2010-11-01 LAB — GLUCOSE, CAPILLARY: Glucose-Capillary: 173 mg/dL — ABNORMAL HIGH (ref 70–99)

## 2010-11-01 LAB — POCT GLYCOSYLATED HEMOGLOBIN (HGB A1C): Hemoglobin A1C: 7.2

## 2010-11-01 MED ORDER — ACCU-CHEK FASTCLIX LANCETS MISC: 1.0000 | Status: DC

## 2010-11-01 MED ORDER — ACCU-CHEK NANO SMARTVIEW W/DEVICE KIT: 1.0000 | PACK | Status: DC

## 2010-11-01 MED ORDER — GLUCOSE BLOOD VI STRP: ORAL_STRIP | Status: DC

## 2010-11-01 NOTE — Patient Instructions (Signed)
Please, follow up with an eye doctor and ask him to send [place] a report. Please, follow up in 6 months.

## 2010-11-01 NOTE — Progress Notes (Signed)
  Subjective:    Patient ID: Jaime [name], female    DOB: 1965/03/14, 45 y.o.   MRN: [id number]  HPI  1. DM, type 2. Denies any hypoglycemic events or other concerns. 2. Flu shot.  Review of Systems  Constitutional: Negative.   HENT: Negative.   Eyes: Negative.   Respiratory: Negative.   Cardiovascular: Negative.   Gastrointestinal: Negative.   Genitourinary: Negative.   Musculoskeletal: Negative.   Skin: Negative.   Hematological: Negative.    Review of Systems  Constitutional: Negative.   HENT: Negative.   Eyes: Negative.   Respiratory: Negative.   Cardiovascular: Negative.   Gastrointestinal: Negative.   Genitourinary: Negative.   Musculoskeletal: Negative.   Skin: Negative.   Endo/Heme/Allergies: Negative.        Objective:   Physical Exam  Vitals: noted General: alert, well-developed, and cooperative to examination.  Head: normocephalic and atraumatic.  Eyes: vision grossly intact, pupils equal, pupils round, pupils reactive to light, no injection and anicteric.  Mouth: pharynx pink and moist, no erythema, and no exudates.  Neck: supple, full ROM, no thyromegaly, no JVD, and no carotid bruits.  Lungs: normal respiratory effort, no accessory muscle use, normal breath sounds, no crackles, and no wheezes. Heart: normal rate, regular rhythm, no murmur, no gallop, and no rub.  Abdomen: soft, non-tender, normal bowel sounds, no distention, no guarding, no rebound tenderness, no hepatomegaly, and no splenomegaly.  Msk: no joint swelling, no joint warmth, and no redness over joints.  Pulses: 2+ DP/PT pulses bilaterally Extremities: No cyanosis, clubbing, edema Neurologic: alert & oriented X3, cranial nerves II-XII intact, strength normal in all extremities, sensation intact to light touch, and gait normal.  Skin: turgor normal and no rashes.  Psych: Oriented X3, memory intact for recent and remote, normally interactive, good eye contact, not anxious appearing, and  not depressed appearing.         Assessment & Plan:  1. DM, type 2. No change of therapy at this time. Weight management addressed. Will check urine microalbumin today. Referred for an annual eye exam. Foot care reviewed with the patient. New glucometer Rx-ed. 2. Elevated BP. No history of HTN per chart review. Will recheck next visit and await results of a fundoscopic exam. 3. Pneumovax and fluvax today.

## 2010-11-02 LAB — MICROALBUMIN / CREATININE URINE RATIO
Creatinine, Urine: 102.4 mg/dL
Microalb, Ur: 1.11 mg/dL (ref 0.00–1.89)

## 2010-11-07 ENCOUNTER — Ambulatory Visit: Payer: Medicaid Other | Admitting: Dietician

## 2010-12-26 ENCOUNTER — Encounter (INDEPENDENT_AMBULATORY_CARE_PROVIDER_SITE_OTHER): Payer: Medicaid Other | Admitting: Ophthalmology

## 2010-12-26 DIAGNOSIS — H3530 Unspecified macular degeneration: Secondary | ICD-10-CM

## 2010-12-26 DIAGNOSIS — E1139 Type 2 diabetes mellitus with other diabetic ophthalmic complication: Secondary | ICD-10-CM

## 2010-12-26 DIAGNOSIS — E11319 Type 2 diabetes mellitus with unspecified diabetic retinopathy without macular edema: Secondary | ICD-10-CM

## 2010-12-26 DIAGNOSIS — H43819 Vitreous degeneration, unspecified eye: Secondary | ICD-10-CM

## 2011-03-27 ENCOUNTER — Telehealth: Payer: Self-pay | Admitting: Oncology

## 2011-03-27 NOTE — Telephone Encounter (Signed)
Primary phone d/c. lmonvm for pt re appts for 3/4 and 9/3. Schedule mailed today.

## 2011-04-03 ENCOUNTER — Other Ambulatory Visit (HOSPITAL_BASED_OUTPATIENT_CLINIC_OR_DEPARTMENT_OTHER): Payer: Medicaid Other | Admitting: Lab

## 2011-04-03 DIAGNOSIS — D573 Sickle-cell trait: Secondary | ICD-10-CM

## 2011-04-03 DIAGNOSIS — D509 Iron deficiency anemia, unspecified: Secondary | ICD-10-CM

## 2011-04-03 DIAGNOSIS — R1310 Dysphagia, unspecified: Secondary | ICD-10-CM

## 2011-04-03 LAB — FERRITIN: Ferritin: 103 ng/mL (ref 10–291)

## 2011-04-03 LAB — CBC WITH DIFFERENTIAL/PLATELET
Eosinophils Absolute: 0 10*3/uL (ref 0.0–0.5)
LYMPH%: 28.1 % (ref 14.0–49.7)
MCV: 86 fL (ref 79.5–101.0)
MONO%: 4.1 % (ref 0.0–14.0)
NEUT#: 3.2 10*3/uL (ref 1.5–6.5)
Platelets: 251 10*3/uL (ref 145–400)
RBC: 4.26 10*6/uL (ref 3.70–5.45)

## 2011-04-06 ENCOUNTER — Telehealth: Payer: Self-pay | Admitting: *Deleted

## 2011-04-06 NOTE — Telephone Encounter (Signed)
Message copied by Caleb Popp on Thu Apr 06, 2011  1:04 PM ------      Message from: Thornton Papas B      Created: Thu Apr 06, 2011  7:22 AM       Please call patient, hb and iron are normal.f/u as scheduled

## 2011-05-11 ENCOUNTER — Emergency Department (INDEPENDENT_AMBULATORY_CARE_PROVIDER_SITE_OTHER)
Admission: EM | Admit: 2011-05-11 | Discharge: 2011-05-11 | Disposition: A | Discharge status: Home / Self Care | Payer: Medicaid Other | Source: Home / Self Care

## 2011-05-11 ENCOUNTER — Encounter (HOSPITAL_COMMUNITY): Payer: Self-pay | Admitting: *Deleted

## 2011-05-11 DIAGNOSIS — J302 Other seasonal allergic rhinitis: Secondary | ICD-10-CM

## 2011-05-11 DIAGNOSIS — J309 Allergic rhinitis, unspecified: Secondary | ICD-10-CM

## 2011-05-11 HISTORY — DX: Gastro-esophageal reflux disease without esophagitis: K21.9

## 2011-05-11 HISTORY — DX: Hidradenitis suppurativa: L73.2

## 2011-05-11 NOTE — ED Provider Notes (Signed)
Medical screening examination/treatment/procedure(s) were performed by a resident physician and as supervising physician I was immediately available for consultation/collaboration.  Joeanne Robicheaux, M.D.   Marielis Samara C Donzell Coller, MD 05/11/11 2154 

## 2011-05-11 NOTE — Discharge Instructions (Signed)
Follow up with her primary care provider as scheduled. I recommend Claritin-D in the short-term. Over the long-term this might cause trouble with high blood pressure and then I would recommend a switch to regular Claritin after couple weeks. If you're still having trouble itchy eyes I recommend Visine allergy. He can use this for about a week and after that leg her eyes rest.  Allergic Rhinitis Allergic rhinitis is when the mucous membranes in the nose respond to allergens. Allergens are particles in the air that cause your body to have an allergic reaction. This causes you to release allergic antibodies. Through a chain of events, these eventually cause you to release histamine into the blood stream (hence the use of antihistamines). Although meant to be protective to the body, it is this release that causes your discomfort, such as frequent sneezing, congestion and an itchy runny nose.  CAUSES  The pollen allergens may come from grasses, trees, and weeds. This is seasonal allergic rhinitis, or "hay fever." Other allergens cause year-round allergic rhinitis (perennial allergic rhinitis) such as house dust mite allergen, pet dander and mold spores.  SYMPTOMS   Nasal stuffiness (congestion).   Runny, itchy nose with sneezing and tearing of the eyes.   There is often an itching of the mouth, eyes and ears.  It cannot be cured, but it can be controlled with medications. DIAGNOSIS  If you are unable to determine the offending allergen, skin or blood testing may find it. TREATMENT   Avoid the allergen.   Medications and allergy shots (immunotherapy) can help.   Hay fever may often be treated with antihistamines in pill or nasal spray forms. Antihistamines block the effects of histamine. There are over-the-counter medicines that may help with nasal congestion and swelling around the eyes. Check with your caregiver before taking or giving this medicine.  If the treatment above does not work, there  are many new medications your caregiver can prescribe. Stronger medications may be used if initial measures are ineffective. Desensitizing injections can be used if medications and avoidance fails. Desensitization is when a patient is given ongoing shots until the body becomes less sensitive to the allergen. Make sure you follow up with your caregiver if problems continue. SEEK MEDICAL CARE IF:   You develop fever (more than 100.5 F (38.1 C).   You develop a cough that does not stop easily (persistent).   You have shortness of breath.   You start wheezing.   Symptoms interfere with normal daily activities.  Document Released: 10/11/2000 Document Revised: 01/05/2011 Document Reviewed: 04/22/2008 [facility] Patient Information 2012 [place], [place].

## 2011-05-11 NOTE — ED Notes (Signed)
C/O HA, nasal congestion, dry cough (unable to bring up sputum) since Tuesday.  Has been taking Alka Seltzer Sinus.

## 2011-05-11 NOTE — ED Provider Notes (Signed)
Jaime [name] is a 46 y.o. female who presents to Urgent Care today for 2 day history of rhinorrhea, itchy eyes, dry cough. She has tried Alka-Seltzer without relief. She does have history of seasonal allergies for which she is taking Claritin in the past (several years ago). She has not tried anything such as Musician or Zyrtec currently. No fevers or chills. Cough nonproductive of sputum. Bladder is clear. Red itchy eyes. No abdominal pain nausea vomiting.   PMH reviewed.  ROS as above otherwise neg Medications reviewed. No current facility-administered medications for this encounter.   Current Outpatient Prescriptions  Medication Sig Dispense Refill  . ACCU-CHEK FASTCLIX LANCETS MISC 1 Device by Does not apply route 1 day or 1 dose. Dx: 250.00  1 each  11  . Blood Glucose Monitoring Suppl (ACCU-CHEK NANO SMARTVIEW) W/DEVICE KIT 1 Device by Does not apply route 1 day or 1 dose. Dx; 250.00  1 kit  1  . glucose blood test strip Dx: 250.02   100 each  12  . omeprazole (PRILOSEC) 40 MG capsule Take 40 mg by mouth daily.          Exam:  BP 146/97  Pulse 103  Temp(Src) 99.7 F (37.6 C) (Oral)  Resp 18  SpO2 98%  LMP 10/31/2005 Gen:  Patient sitting on exam table, appears stated age in no acute distress Head: Normocephalic atraumatic Eyes: EOMI, PERRL, sclera and conjunctiva erythematous. No pain with extraocular movements. Nose:  Nasal turbinates grossly enlarged bilaterally and boggy appearing without exudates noted. No tenderness to maxillary sinus noted. Mouth: Mucosa membranes moist. Tonsils +2, nonenlarged, non-erythematous. Neck: No cervical lymphadenopathy noted Heart:  RRR, with grade 2/6 systolic ejection murmur noted bilateral sternal borders. Pulm:  Clear to auscultation bilaterally with good air movement.  No wheezes or rales noted.      Assessment and Plan:  #1. Seasonal allergies: No signs of URI. As Claritin has worked in the past I recommend Claritin-D for  the short-term with switch long-term to non-decongestant Claritin. She states she has appointment with her PCP this coming Tuesday. She may at that point need to be transitioned to inhaled corticosteroids if she is not receive relief with over-the-counter second-generation antihistamines.  Short-term Visine relief as well.  Tobey Grim, MD 05/11/11 [phone]

## 2011-05-16 ENCOUNTER — Ambulatory Visit (INDEPENDENT_AMBULATORY_CARE_PROVIDER_SITE_OTHER): Payer: Medicaid Other | Admitting: Internal Medicine

## 2011-05-16 VITALS — BP 135/84 | HR 98 | Temp 98.7°F | Ht 63.0 in | Wt 192.0 lb

## 2011-05-16 DIAGNOSIS — Z79899 Other long term (current) drug therapy: Secondary | ICD-10-CM

## 2011-05-16 DIAGNOSIS — E119 Type 2 diabetes mellitus without complications: Secondary | ICD-10-CM

## 2011-05-16 DIAGNOSIS — J309 Allergic rhinitis, unspecified: Secondary | ICD-10-CM

## 2011-05-16 LAB — LIPID PANEL
HDL: 23 mg/dL — ABNORMAL LOW (ref >39)
LDL Cholesterol: 56 mg/dL (ref 0–99)
Triglycerides: 312 mg/dL — ABNORMAL HIGH (ref <150)
VLDL: 62 mg/dL — ABNORMAL HIGH (ref 0–40)

## 2011-05-16 MED ORDER — LORATADINE-PSEUDOEPHEDRINE ER 5-120 MG PO TB12: 1.0000 | ORAL_TABLET | Freq: Two times a day (BID) | ORAL | Status: DC

## 2011-05-16 NOTE — Patient Instructions (Addendum)
Please, take all your medications and check your blood sugar levels as instructed. Please, call with any questions and follow up in 6 months. Thank you.

## 2011-05-16 NOTE — Progress Notes (Signed)
Patient ID: Jaime [name], female   DOB: 1965/07/28, 46 y.o.   MRN: [id number] HPI:     Review of Systems: Negative except per history of present illness  Physical Exam:  Nursing notes and vitals reviewed General:  alert, well-developed, and cooperative to examination.   Lungs:  normal respiratory effort, no accessory muscle use, normal breath sounds, no crackles, and no wheezes. Heart:  normal rate, regular rhythm, no murmurs, no gallop, and no rub.   Abdomen:  soft, non-tender, normal bowel sounds, no distention, no guarding, no rebound tenderness, no hepatomegaly, and no splenomegaly.   Extremities:  No cyanosis, clubbing, edema Neurologic:  alert & oriented X3, nonfocal exam  Meds: Medications Prior to Admission  Medication Sig Dispense Refill  . ACCU-CHEK FASTCLIX LANCETS MISC 1 Device by Does not apply route 1 day or 1 dose. Dx: 250.00  1 each  11  . Blood Glucose Monitoring Suppl (ACCU-CHEK NANO SMARTVIEW) W/DEVICE KIT 1 Device by Does not apply route 1 day or 1 dose. Dx; 250.00  1 kit  1  . glucose blood test strip Dx: 250.02   100 each  12  . omeprazole (PRILOSEC) 40 MG capsule Take 40 mg by mouth daily.         No current facility-administered medications on file as of 05/16/2011.    Allergies: Review of patient's allergies indicates no known allergies. Past Medical History  Diagnosis Date  . DUB (dysfunctional uterine bleeding)     total abdominal hysterectomy in 2007, ovaries intact  . Furuncle of multiple sites     multiple I and D's  . Diabetes mellitus     Diet controlled  . GERD (gastroesophageal reflux disease)   . Hydradenitis    Past Surgical History  Procedure Date  . Tonsillectomy and adenoidectomy     in early childhood  . Abdominal hysterectomy   . Hydradenitis excision    Family History  Problem Relation Age of Onset  . Cancer Mother 61    Breast cancer  . Hypertension Mother   . Depression Mother   . Sickle cell trait Mother   . Cancer  Father 50    lung cancer, was a long term smoker   History   Social History  . Marital Status: Single    Spouse Name: N/A    Number of Children: N/A  . Years of Education: N/A   Occupational History  . Not on file.   Social History Main Topics  . Smoking status: Never Smoker   . Smokeless tobacco: Not on file  . Alcohol Use: No  . Drug Use: No  . Sexually Active: Yes    Birth Control/ Protection: Surgical   Other Topics Concern  . 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 Allergic rhinitis - Claritin D 12 hr I po bid PRN -Saline nasal irrigations PRN -avoid exposure to known allergens  2. DM, type 2 -controlled -foot care, signs of hypoglycemia, medications, diet, exercise reviewed with the patient and her mother. -FLP today F/u in 6 months or sooner if needed.

## 2011-05-17 NOTE — Progress Notes (Signed)
Loratadine-pseudoephedrine rx faxed to [facility] pharmacy on [address]; pt made awared.

## 2011-10-03 ENCOUNTER — Ambulatory Visit (HOSPITAL_BASED_OUTPATIENT_CLINIC_OR_DEPARTMENT_OTHER): Payer: Medicaid Other | Admitting: Oncology

## 2011-10-03 ENCOUNTER — Telehealth: Payer: Self-pay | Admitting: Oncology

## 2011-10-03 ENCOUNTER — Other Ambulatory Visit (HOSPITAL_BASED_OUTPATIENT_CLINIC_OR_DEPARTMENT_OTHER): Payer: Medicaid Other

## 2011-10-03 VITALS — BP 146/95 | HR 97 | Temp 98.1°F | Resp 18 | Ht 63.0 in | Wt 190.9 lb

## 2011-10-03 DIAGNOSIS — R030 Elevated blood-pressure reading, without diagnosis of hypertension: Secondary | ICD-10-CM

## 2011-10-03 DIAGNOSIS — D509 Iron deficiency anemia, unspecified: Secondary | ICD-10-CM

## 2011-10-03 DIAGNOSIS — R1310 Dysphagia, unspecified: Secondary | ICD-10-CM

## 2011-10-03 DIAGNOSIS — D573 Sickle-cell trait: Secondary | ICD-10-CM

## 2011-10-03 DIAGNOSIS — D500 Iron deficiency anemia secondary to blood loss (chronic): Secondary | ICD-10-CM

## 2011-10-03 LAB — CBC WITH DIFFERENTIAL/PLATELET
BASO%: 0.2 % (ref 0.0–2.0)
Basophils Absolute: 0 10*3/uL (ref 0.0–0.1)
EOS%: 0.7 % (ref 0.0–7.0)
HCT: 39.8 % (ref 34.8–46.6)
HGB: 13.4 g/dL (ref 11.6–15.9)
LYMPH%: 25.1 % (ref 14.0–49.7)
MCH: 28.4 pg (ref 25.1–34.0)
MCHC: 33.7 g/dL (ref 31.5–36.0)
NEUT%: 70.2 % (ref 38.4–76.8)
Platelets: 276 10*3/uL (ref 145–400)
lymph#: 1.5 10*3/uL (ref 0.9–3.3)

## 2011-10-03 NOTE — Telephone Encounter (Signed)
gve the pt her sept 2014 appt calendar °

## 2011-10-03 NOTE — Progress Notes (Signed)
   [facility]    OFFICE PROGRESS NOTE   INTERVAL HISTORY:   She returns as scheduled. No complaint. No bleeding. No evidence of a sickle cell crisis.  Objective:  Vital signs in last 24 hours:  Blood pressure 146/95, pulse 97, temperature 98.1 F (36.7 C), temperature source Oral, resp. rate 18, height 5\' 3"  (1.6 m), weight 190 lb 14.4 oz (86.592 kg), last menstrual period 10/31/2005.   Resp: Lungs clear bilaterally Cardio: Regular rate and rhythm GI: No hepatosplenomegaly, nontender Vascular: No leg edema, the left lower leg is slightly larger than the right side, no erythema    Lab Results:  Lab Results  Component Value Date   WBC 5.9 10/03/2011   HGB 13.4 10/03/2011   HCT 39.8 10/03/2011   MCV 84.2 10/03/2011   PLT 276 10/03/2011      Medications: I have reviewed the patient's current medications.  Assessment/Plan: 1. History of iron deficiency anemia.  The hemoglobin remains in the normal range.  The MCV is also in the normal range. 2. Sickle cell trait. 3. Status post hysterectomy. 4.      History of dysphagia.  She underwent an endoscopy by Dr. Loreta Ave.  Disposition:  She appears stable from a hematologic standpoint. She would like to continue followup in the hematology clinic. Ms. Jani will return for an office visit and CBC in one year.  Her blood pressure is elevated today. I recommended she followup in the medicine clinic for management of hypertension.  Thornton Papas, MD  10/03/2011  2:16 PM

## 2011-10-03 NOTE — Patient Instructions (Signed)
Jaime [name] Nov 04, 1965 [id number]  [facility] Discharge Instructions  Your exam findings, labs and results were discussed with your MD today.  Filed Vitals:   10/03/11 1214  BP: 146/95  Pulse: 97  Temp: 98.1 F (36.7 C)  Resp: 18   Current outpatient prescriptions:ACCU-CHEK FASTCLIX LANCETS MISC, 1 Device by Does not apply route 1 day or 1 dose. Dx: 250.00, Disp: 1 each, Rfl: 11;  Blood Glucose Monitoring Suppl (ACCU-CHEK NANO SMARTVIEW) W/DEVICE KIT, 1 Device by Does not apply route 1 day or 1 dose. Dx; 250.00, Disp: 1 kit, Rfl: 1;  glucose blood test strip, Dx: 250.02 , Disp: 100 each, Rfl: 12 loratadine-pseudoephedrine (CLARITIN-D 12 HOUR) 5-120 MG per tablet, Take 1 tablet by mouth 2 (two) times daily., Disp: 60 tablet, Rfl: 2;  omeprazole (PRILOSEC) 40 MG capsule, Take 40 mg by mouth daily.  , Disp: , Rfl:   Please visit scheduling to obtain calendar for future appointments.  Please call the [facility] at [phone] during business hours should you have any further questions or need assistance in obtaining follow-up care. If you have a medical emergency, please dial [phone].  Special Instructions:

## 2011-10-10 ENCOUNTER — Ambulatory Visit (INDEPENDENT_AMBULATORY_CARE_PROVIDER_SITE_OTHER): Payer: Medicaid Other | Admitting: Internal Medicine

## 2011-10-10 ENCOUNTER — Encounter: Payer: Self-pay | Admitting: Internal Medicine

## 2011-10-10 VITALS — BP 132/84 | HR 101 | Temp 98.6°F | Ht 64.0 in | Wt 191.6 lb

## 2011-10-10 DIAGNOSIS — Z79899 Other long term (current) drug therapy: Secondary | ICD-10-CM

## 2011-10-10 DIAGNOSIS — E119 Type 2 diabetes mellitus without complications: Secondary | ICD-10-CM

## 2011-10-10 DIAGNOSIS — Z23 Encounter for immunization: Secondary | ICD-10-CM

## 2011-10-10 DIAGNOSIS — K219 Gastro-esophageal reflux disease without esophagitis: Secondary | ICD-10-CM

## 2011-10-10 DIAGNOSIS — E786 Lipoprotein deficiency: Secondary | ICD-10-CM | POA: Insufficient documentation

## 2011-10-10 DIAGNOSIS — D500 Iron deficiency anemia secondary to blood loss (chronic): Secondary | ICD-10-CM

## 2011-10-10 DIAGNOSIS — J309 Allergic rhinitis, unspecified: Secondary | ICD-10-CM

## 2011-10-10 MED ORDER — METFORMIN HCL 500 MG PO TABS: ORAL_TABLET | ORAL | Status: DC

## 2011-10-10 MED ORDER — ACCU-CHEK NANO SMARTVIEW W/DEVICE KIT: 1.0000 | PACK | Status: DC

## 2011-10-10 MED ORDER — ACCU-CHEK FASTCLIX LANCETS MISC: 1.0000 | Status: DC

## 2011-10-10 MED ORDER — PRAVASTATIN SODIUM 20 MG PO TABS: 20.0000 mg | ORAL_TABLET | Freq: Every evening | ORAL | Status: DC

## 2011-10-10 MED ORDER — LORATADINE-PSEUDOEPHEDRINE ER 5-120 MG PO TB12: 1.0000 | ORAL_TABLET | Freq: Two times a day (BID) | ORAL | Status: AC

## 2011-10-10 MED ORDER — OMEPRAZOLE 40 MG PO CPDR: 40.0000 mg | DELAYED_RELEASE_CAPSULE | Freq: Every day | ORAL | Status: DC

## 2011-10-10 MED ORDER — GLUCOSE BLOOD VI STRP: ORAL_STRIP | Status: AC

## 2011-10-10 NOTE — Assessment & Plan Note (Signed)
Lab Results  Component Value Date   WBC 5.9 10/03/2011   HGB 13.4 10/03/2011   HCT 39.8 10/03/2011   MCV 84.2 10/03/2011   PLT 276 10/03/2011  Pt w h/o Fe deficient anemia 2/2 DUB, but is now s/p complete hysterectomy. No vaginal bleeding. Continues to follow with Dr. Truett Perna, last visit was last week.  - Hb stable, nothing to do per their notes. She would prefer to continue follow-up with hematology annually.

## 2011-10-10 NOTE — Assessment & Plan Note (Signed)
Lab Results  Component Value Date   HGBA1C 8.1 10/10/2011   HGBA1C 7.1 11/16/2009   CREATININE 0.52 10/06/2009   MICROALBUR 1.11 11/01/2010   MICRALBCREAT 10.8 11/01/2010   CHOL 141 05/16/2011   HDL 23* 05/16/2011   TRIG 312* 05/16/2011    Last eye exam and foot exam: No results found for this basename: HMDIABEYEEXA, HMDIABFOOTEX    Assessment: Diabetes control: not controlled Progress toward goals: deteriorated Barriers to meeting goals: none  Plan: Diabetes treatment: Lifestyle modifications have failed at this point. Will start Metformin and titrate up to 1000mg  BID, re-eval in 3 mo Refer to: none Instruction/counseling given: reminded to get eye exam, reminded to bring blood glucose meter & log to each visit, reminded to bring medications to each visit and discussed diet  Will check BMP and Microalbumin:Cr today.

## 2011-10-10 NOTE — Assessment & Plan Note (Signed)
Lab Results  Component Value Date   CHOL 141 05/16/2011   HDL 23* 05/16/2011   LDLCALC 56 05/16/2011   TRIG 312* 05/16/2011   CHOLHDL 6.1 05/16/2011   Pt w low HDL (23). AIM High Trial demonstrates no clear CV benefit from medical augmentation of HDL in patients with low LDL. Niacin likely will not be well tolerated, and pt w hypertriglyceridemia.  - Will add statin therapy. Pt's would like to try pravastatin, which her mother has taken for HLD. Will start w 20mg  and titrate up. Repeat FLP at next visit in 3 mo.

## 2011-10-10 NOTE — Progress Notes (Addendum)
Patient ID: Jaime [name], female   DOB: 08-31-1965, 46 y.o.   MRN: [id number]   Subjective:   Patient ID: Jaime [name] female   DOB: 10/25/1965 46 y.o.   MRN: [id number]  HPI: Jaime [name] is a 46 y.o. female w pmh of T2DM, DUB s/p complete hysterectomy and GERD presenting for follow-up with no complaints today. Her DM has been diet controlled and she reports she is eating more salads and less red meat, fatty food, and sugary foods. She tries to climb the stairs at her workplace for exercise. She checks her FBG every am and reports values in 100s-200s. Her meter has not been working for the past 2 or 3 days. She denies any numbness of extremities, headaches or visual changes. She follows regular w ophthalmologist and does not have diabetic retinopathy.  She takes prilosec for GERD and claritin for allergic rhinitis which have helped her symptoms considerably. She had h/o Fe-deficiency anemia in the past 2/2 DUB but is s/p hysterectomy and Hb has normalized. She continues to follow w Dr. Truett Perna, her most recent visit was last week and CBC was wnl.     Past Medical History  Diagnosis Date  . DUB (dysfunctional uterine bleeding)     total abdominal hysterectomy in 2007, ovaries intact  . Furuncle of multiple sites     multiple I and D's  . Diabetes mellitus     Diet controlled  . GERD (gastroesophageal reflux disease)   . Hydradenitis    Current Outpatient Prescriptions  Medication Sig Dispense Refill  . ACCU-CHEK FASTCLIX LANCETS MISC 1 Device by Does not apply route 1 day or 1 dose. Dx: 250.00  1 each  11  . Blood Glucose Monitoring Suppl (ACCU-CHEK NANO SMARTVIEW) W/DEVICE KIT 1 Device by Does not apply route 1 day or 1 dose. Dx; 250.00  1 kit  1  . glucose blood test strip Dx: 250.02  100 each  12  . loratadine-pseudoephedrine (CLARITIN-D 12 HOUR) 5-120 MG per tablet Take 1 tablet by mouth 2 (two) times daily.  60 tablet  2  . metFORMIN (GLUCOPHAGE) 500 MG tablet Take 1  tablet (500mg ) at bedtime for 1 week. Then add 1 tablet at bedtime and 1 tablet in the morning for another week. Then increase to 2 tablets in the morning and 2 tablets at night.  120 tablet  2  . omeprazole (PRILOSEC) 40 MG capsule Take 1 capsule (40 mg total) by mouth daily.  30 capsule  11  . pravastatin (PRAVACHOL) 20 MG tablet Take 1 tablet (20 mg total) by mouth every evening.  30 tablet  11  . DISCONTD: ACCU-CHEK FASTCLIX LANCETS MISC 1 Device by Does not apply route 1 day or 1 dose. Dx: 250.00  1 each  11  . DISCONTD: Blood Glucose Monitoring Suppl (ACCU-CHEK NANO SMARTVIEW) W/DEVICE KIT 1 Device by Does not apply route 1 day or 1 dose. Dx; 250.00  1 kit  1  . DISCONTD: omeprazole (PRILOSEC) 40 MG capsule Take 40 mg by mouth daily.         Family History  Problem Relation Age of Onset  . Cancer Mother 57    Breast cancer  . Hypertension Mother   . Depression Mother   . Sickle cell trait Mother   . Cancer Father 19    lung cancer, was a long term smoker   History   Social History  . Marital Status: Single    Spouse Name: N/A  Number of Children: N/A  . Years of Education: N/A   Social History Main Topics  . Smoking status: Never Smoker   . Smokeless tobacco: None  . Alcohol Use: No  . Drug Use: No  . Sexually Active: Yes    Birth Control/ Protection: Surgical   Other Topics Concern  . 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  HEENT: Denies photophobia, eye pain, redness, hearing loss, ear pain, congestion, sore throat, rhinorrhea, sneezing, mouth sores, trouble swallowing, neck pain Respiratory: Denies SOB, DOE, cough, chest tightness,  and wheezing.   Cardiovascular: Denies chest pain, palpitations and leg  swelling.  Gastrointestinal: Denies nausea, vomiting, abdominal pain, diarrhea, constipation, blood in stool and abdominal distention.  Genitourinary: Denies dysuria, urgency, frequency, hematuria, flank pain and difficulty urinating.  Musculoskeletal: Denies myalgias, back pain, joint swelling, arthralgias and gait problem.  Skin: Denies rash Neurological: Denies dizziness, seizures, syncope, weakness, light-headedness, numbness and headaches.  Hematological: Denies adenopathy Psychiatric/Behavioral: Denies mood changes  Objective:  Physical Exam: Filed Vitals:   10/10/11 1324  BP: 132/84  Pulse: 101  Temp: 98.6 F (37 C)  TempSrc: Oral  Height: 5\' 4"  (1.626 m)  Weight: 191 lb 9.6 oz (86.909 kg)  SpO2: 98%   Constitutional: Vital signs reviewed.  Patient is a well-developed and well-nourished AAF in no acute distress and cooperative with exam. Alert and oriented x3.  Head: Normocephalic and atraumatic Mouth: no erythema or exudates, MMM Eyes: PERRL, EOMI, conjunctivae normal, No scleral icterus.  Neck: Supple, Trachea midline normal ROM, No JVD, mass, thyromegaly, or carotid bruit present.  Cardiovascular: RRR, S1 normal, S2 normal, no MRG, pulses symmetric and intact bilaterally Pulmonary/Chest: CTAB, no wheezes, rales, or rhonchi Abdominal: Soft. Non-tender, non-distended, bowel sounds are normal, no masses, organomegaly, or guarding present.  GU: no CVA tenderness Musculoskeletal: No joint deformities, erythema, or stiffness, ROM full and no nontender Hematology: no cervical, inginal, or axillary adenopathy.  Neurological: A&O x3, Strength is normal and symmetric bilaterally, cranial nerve II-XII are grossly intact, no focal motor deficit, sensory intact to light touch bilaterally.  Skin: Warm, dry and intact. No rash, cyanosis, or clubbing.  Psychiatric: Normal mood and affect.   Assessment & Plan:   INTERNAL MEDICINE TEACHING ATTENDING ADDENDUM - Lars Mage, MD: I  personally saw and evaluated Jaime [name] in this clinic visit in conjunction with the resident, Dr. Heloise Beecham. I have discussed the patient's plan of care with Dr. Heloise Beecham during this visit. I have confirmed the physical exam findings and have read and agree with the clinic note including the plan.

## 2011-10-10 NOTE — Patient Instructions (Addendum)
1. Please start taking Metformin for your diabetes. Take 1 tablet every night for 1 week. Then take 1 tablet at night and 1 tablet at breakfast for the next week. Then increase to 2 tablets at night and 2 tablets with breakfast everyday until your next appointment. 2. Please check your blood sugar every morning before you eat. 3. Please take pravastatin 20mg  every day for your high cholesterol. 4. Please return to our office or call if you have any concerns.

## 2011-10-11 LAB — BASIC METABOLIC PANEL WITH GFR
BUN: 7 mg/dL (ref 6–23)
CO2: 29 mEq/L (ref 19–32)
Calcium: 9.8 mg/dL (ref 8.4–10.5)
Glucose, Bld: 140 mg/dL — ABNORMAL HIGH (ref 70–99)
Potassium: 3.8 mEq/L (ref 3.5–5.3)
Sodium: 137 mEq/L (ref 135–145)

## 2011-10-11 LAB — MICROALBUMIN / CREATININE URINE RATIO
Creatinine, Urine: 110.2 mg/dL
Microalb Creat Ratio: 5.4 mg/g (ref 0.0–30.0)

## 2011-10-17 ENCOUNTER — Other Ambulatory Visit: Payer: Self-pay | Admitting: Oncology

## 2011-10-17 DIAGNOSIS — Z1231 Encounter for screening mammogram for malignant neoplasm of breast: Secondary | ICD-10-CM

## 2011-10-23 ENCOUNTER — Ambulatory Visit
Admission: RE | Admit: 2011-10-23 | Discharge: 2011-10-23 | Disposition: A | Discharge status: Home / Self Care | Payer: Medicaid Other | Source: Ambulatory Visit | Attending: Oncology | Admitting: Oncology

## 2011-10-23 DIAGNOSIS — Z1231 Encounter for screening mammogram for malignant neoplasm of breast: Secondary | ICD-10-CM

## 2012-02-16 ENCOUNTER — Other Ambulatory Visit: Payer: Self-pay | Admitting: Internal Medicine

## 2012-02-20 ENCOUNTER — Encounter: Payer: Self-pay | Admitting: Internal Medicine

## 2012-02-20 ENCOUNTER — Ambulatory Visit (INDEPENDENT_AMBULATORY_CARE_PROVIDER_SITE_OTHER): Payer: Medicaid Other | Admitting: Internal Medicine

## 2012-02-20 VITALS — BP 125/81 | HR 83 | Temp 99.2°F | Ht 63.5 in | Wt 189.1 lb

## 2012-02-20 DIAGNOSIS — E119 Type 2 diabetes mellitus without complications: Secondary | ICD-10-CM

## 2012-02-20 DIAGNOSIS — Z79899 Other long term (current) drug therapy: Secondary | ICD-10-CM

## 2012-02-20 DIAGNOSIS — E786 Lipoprotein deficiency: Secondary | ICD-10-CM

## 2012-02-20 LAB — GLUCOSE, CAPILLARY: Glucose-Capillary: 96 mg/dL (ref 70–99)

## 2012-02-20 MED ORDER — METFORMIN HCL 500 MG PO TABS: ORAL_TABLET | ORAL | Status: DC

## 2012-02-20 NOTE — Assessment & Plan Note (Signed)
Lab Results  Component Value Date   HGBA1C 6.3 02/20/2012   HGBA1C 7.1 11/16/2009   CREATININE 0.49* 10/10/2011   CREATININE 0.52 10/06/2009   MICROALBUR 0.59 10/10/2011   MICRALBCREAT 5.4 10/10/2011   CHOL 141 05/16/2011   HDL 23* 05/16/2011   TRIG 312* 05/16/2011    Last eye exam and foot exam: No DM retinopathy 12/2011, no neuropathy  Assessment: Diabetes control: controlled Progress toward goals: at goal Barriers to meeting goals: no barriers identified  Plan: Diabetes treatment: continue current medications Refer to: none Instruction/counseling given: reminded to bring blood glucose meter & log to each visit and discussed diet   She is doing fantastic. Continue metformin.

## 2012-02-20 NOTE — Patient Instructions (Addendum)
1. Keep taking your metformin for diabetes and pravastatin for cholesterol. 2. Come back and see me in 6months.  3. You are doing a great job!!  General Instructions:    Treatment Goals:  Goals (1 Years of Data) as of 02/20/2012          As of Today 10/10/11 10/03/11 05/16/11 05/11/11     Blood Pressure    . Blood Pressure < 140/90  125/81 132/84 146/95 135/84 146/97     Result Component    . HEMOGLOBIN A1C < 7.0  6.3 8.1  7.2     . LDL CALC < 100     56       Progress Toward Treatment Goals: GREAT JOB!    Self Care Goals & Plans:  Self Care Goal 02/20/2012  Manage my medications take my medicines as prescribed; bring my medications to every visit; refill my medications on time  Monitor my health keep track of my blood glucose  Eat healthy foods drink diet soda or water instead of juice or soda; eat more vegetables

## 2012-02-20 NOTE — Progress Notes (Signed)
Patient ID: Jaime [name], female   DOB: 01-27-1966, 47 y.o.   MRN: [id number]  Subjective:   Patient ID: Jaime [name] female   DOB: 11-09-1965 46 y.o.   MRN: [id number]  HPI: Ms.[name] is a 47 y.o. female w T2DM presenting for follow up of DM At her last visit in September, HbA1c had increased from 7.1-8.1. Up to that point, patient had been using lifestyle modifications to manage disease. At her last appointment, we started metformin and titrate up to 1000 mg twice a day. She reports daily appearance to medication and good tolerance without bothersome side effects. She saw the ophthalmologist in December, reports she was told she had no eye disease. We will need to followup with records from that visit. She checks her blood sugar at home and says it's usually in the 130s. Highest value has been 200. Denies any visual changes or numbness or tingling of her extremity.  At her last visit, we also started her on pravastatin therapy for low HDL (23). She tolerated the medication well without myalgias or other side effects. She is completely without complaints today and says she feels great.   Past Medical History  Diagnosis Date  . DUB (dysfunctional uterine bleeding)     total abdominal hysterectomy in 2007, ovaries intact  . Furuncle of multiple sites     multiple I and D's  . Diabetes mellitus     Diet controlled  . GERD (gastroesophageal reflux disease)   . Hydradenitis    Current Outpatient Prescriptions  Medication Sig Dispense Refill  . ACCU-CHEK FASTCLIX LANCETS MISC 1 Device by Does not apply route 1 day or 1 dose. Dx: 250.00  1 each  11  . Blood Glucose Monitoring Suppl (ACCU-CHEK NANO SMARTVIEW) W/DEVICE KIT 1 Device by Does not apply route 1 day or 1 dose. Dx; 250.00  1 kit  1  . glucose blood test strip Dx: 250.02  100 each  12  . loratadine-pseudoephedrine (CLARITIN-D 12 HOUR) 5-120 MG per tablet Take 1 tablet by mouth 2 (two) times daily.  60 tablet  2  . metFORMIN  (GLUCOPHAGE) 500 MG tablet Take 1000mg  (2 pills) twice a day  120 tablet  6  . omeprazole (PRILOSEC) 40 MG capsule Take 1 capsule (40 mg total) by mouth daily.  30 capsule  11  . pravastatin (PRAVACHOL) 20 MG tablet Take 1 tablet (20 mg total) by mouth every evening.  30 tablet  11   Family History  Problem Relation Age of Onset  . Cancer Mother 109    Breast cancer  . Hypertension Mother   . Depression Mother   . Sickle cell trait Mother   . Cancer Father 45    lung cancer, was a long term smoker   History   Social History  . Marital Status: Single    Spouse Name: N/A    Number of Children: N/A  . Years of Education: N/A   Social History Main Topics  . Smoking status: Never Smoker   . Smokeless tobacco: None  . Alcohol Use: No  . Drug Use: No  . Sexually Active: Yes    Birth Control/ Protection: Surgical   Other Topics Concern  . 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  Patient is an overweight female in no acute distress and cooperative with exam. Alert and oriented x3.  Head: Normocephalic and atraumatic Mouth: no erythema or exudates, MMM Eyes: PERRL, EOMI, conjunctivae normal, No scleral icterus.  Neck: Supple, Trachea midline normal ROM, No JVD, mass, thyromegaly, or carotid bruit present.  Cardiovascular: RRR, S1 normal, S2 normal, no MRG, pulses symmetric and intact bilaterally Pulmonary/Chest: CTAB, no wheezes, rales, or rhonchi Abdominal: Soft. Non-tender, non-distended, bowel  sounds are normal, no masses, organomegaly, or guarding present.  Hematology: no cervical LAD  Neurological: A&O x3, Strength is normal and symmetric bilaterally, cranial nerve II-XII are grossly intact, no focal motor deficit, sensory intact to light touch bilaterally.  Skin: Warm, dry and intact. No rash, cyanosis, or clubbing.  Psychiatric: Normal mood and affect.  Assessment & Plan:

## 2012-02-20 NOTE — Assessment & Plan Note (Addendum)
Lab Results  Component Value Date   CHOL 141 05/16/2011   HDL 23* 05/16/2011   LDLCALC 56 05/16/2011   TRIG 312* 05/16/2011   CHOLHDL 6.1 05/16/2011   Patient on pravastatin for low HDL. Per AIM High Trial, no CV benefit from medical augmentation of HDL in pts with low LDL. Tolerating well w no side effects.  - Repeat cholesterol panel at next visit.

## 2012-02-20 NOTE — Telephone Encounter (Signed)
Has appt with you today. I did not refill med.

## 2012-07-16 ENCOUNTER — Encounter: Payer: Self-pay | Admitting: Internal Medicine

## 2012-07-16 ENCOUNTER — Ambulatory Visit (INDEPENDENT_AMBULATORY_CARE_PROVIDER_SITE_OTHER): Payer: Medicaid Other | Admitting: Internal Medicine

## 2012-07-16 VITALS — BP 122/77 | HR 93 | Temp 99.1°F | Ht 61.0 in | Wt 185.3 lb

## 2012-07-16 DIAGNOSIS — E119 Type 2 diabetes mellitus without complications: Secondary | ICD-10-CM

## 2012-07-16 DIAGNOSIS — E786 Lipoprotein deficiency: Secondary | ICD-10-CM

## 2012-07-16 LAB — POCT GLYCOSYLATED HEMOGLOBIN (HGB A1C): Hemoglobin A1C: 6.7

## 2012-07-16 LAB — GLUCOSE, CAPILLARY: Glucose-Capillary: 129 mg/dL — ABNORMAL HIGH (ref 70–99)

## 2012-07-16 MED ORDER — METFORMIN HCL 1000 MG PO TABS: 1000.0000 mg | ORAL_TABLET | Freq: Two times a day (BID) | ORAL | Status: DC

## 2012-07-16 NOTE — Patient Instructions (Signed)
1. Start taking the 1000mg  Metformin, ONE PILL WITH BREAKFAST AND ONE PILL WITH DINNER. You are doing a great job!    Treatment Goals:  Goals (1 Years of Data) as of 07/16/12         As of Today 02/20/12 10/10/11 10/03/11 05/16/11     Blood Pressure    . Blood Pressure < 140/90  122/77 125/81 132/84 146/95 135/84     Result Component    . HEMOGLOBIN A1C < 7.0  6.7 6.3 8.1  7.2    . LDL CALC < 100      56      Progress Toward Treatment Goals:  Treatment Goal 07/16/2012  Hemoglobin A1C at goal    Self Care Goals & Plans:  Self Care Goal 07/16/2012  Manage my medications take my medicines as prescribed; bring my medications to every visit; refill my medications on time  Monitor my health keep track of my blood glucose; bring my glucose meter and log to each visit  Eat healthy foods drink diet soda or water instead of juice or soda; eat more vegetables; eat foods that are low in salt; eat baked foods instead of fried foods    Home Blood Glucose Monitoring 07/16/2012  Check my blood sugar 2 times a day  When to check my blood sugar before breakfast; after dinner     Care Management & Community Referrals:  Referral 02/20/2012  Referrals made for care management support none needed

## 2012-07-16 NOTE — Progress Notes (Signed)
Patient ID: Jaime [name], female   DOB: [birth year], 47 y.o.   MRN: [id number]  Subjective:   Patient ID: Jaime [name] female   DOB: [birth year] 46 y.o.   MRN: [id number]  HPI: [name] is a 47 y.o. female w T2DM presenting for follow up of T2DM. She has no complaints today.  Has lost 5 lbs since last visit by cutting out soda. Also always uses the stairs at work.  At her last visit in January, HbA1c had improved from 8.1-->6.3. She reports compliance with metformin 1000 mg twice a day. Ophtho records faxed from doctor in December show no diabetic retinopathy.  She checks her blood sugar at home and says it's usually in the 130s. Review of meter shows 93% CBG msmts in target range, lowest 121 and highest 189. Denies any visual changes or numbness or tingling of her extremity.  She is taking pravastatin therapy for low HDL (23). She has tolerated medication well, is due for repeat lipid panel today.   Past Medical History  Diagnosis Date  . DUB (dysfunctional uterine bleeding)     total abdominal hysterectomy in 2007, ovaries intact  . Furuncle of multiple sites     multiple I and D's  . Diabetes mellitus   . GERD (gastroesophageal reflux disease)   . Hydradenitis    Current Outpatient Prescriptions  Medication Sig Dispense Refill  . ACCU-CHEK FASTCLIX LANCETS MISC 1 Device by Does not apply route 1 day or 1 dose. Dx: 250.00  1 each  11  . Blood Glucose Monitoring Suppl (ACCU-CHEK NANO SMARTVIEW) W/DEVICE KIT 1 Device by Does not apply route 1 day or 1 dose. Dx; 250.00  1 kit  1  . glucose blood test strip Dx: 250.02  100 each  12  . loratadine-pseudoephedrine (CLARITIN-D 12 HOUR) 5-120 MG per tablet Take 1 tablet by mouth 2 (two) times daily.  60 tablet  2  . metFORMIN (GLUCOPHAGE) 500 MG tablet Take 1000mg  (2 pills) twice a day  120 tablet  6  . omeprazole (PRILOSEC) 40 MG capsule Take 1 capsule (40 mg total) by mouth daily.  30 capsule  11  . pravastatin (PRAVACHOL) 20  MG tablet Take 1 tablet (20 mg total) by mouth every evening.  30 tablet  11   No current facility-administered medications for this visit.   Family History  Problem Relation Age of Onset  . Cancer Mother 66    Breast cancer  . Hypertension Mother   . Depression Mother   . Sickle cell trait Mother   . Cancer Father 60    lung cancer, was a long term smoker   History   Social History  . Marital Status: Single    Spouse Name: N/A    Number of Children: N/A  . Years of Education: N/A   Social History Main Topics  . Smoking status: Never Smoker   . Smokeless tobacco: None  . Alcohol Use: No  . Drug Use: No  . Sexually Active: Yes    Birth Control/ Protection: Surgical   Other Topics Concern  . 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   No kids.   No sexual activity   Lives with her mother   Review of Systems: 10 pt ROS performed, pertinent positives and negatives noted in HPI Objective:  Physical Exam: Filed Vitals:   07/16/12  1416  BP: 122/77  Pulse: 93  Temp: 99.1 F (37.3 C)  TempSrc: Oral  Height: 5\' 1"  (1.549 m)  Weight: 185 lb 4.8 oz (84.052 kg)  SpO2: 97%   Constitutional: Vital signs reviewed. Patient is an overweight female in no acute distress and cooperative with exam. Alert and oriented x3.  Head: Normocephalic and atraumatic  Mouth: no erythema or exudates, MMM  Eyes: PERRL, EOMI, conjunctivae normal, No scleral icterus.  Neck: Supple, Trachea midline normal ROM, No JVD, mass, thyromegaly, or carotid bruit present.  Cardiovascular: RRR, S1 normal, S2 normal, no MRG Pulmonary/Chest: CTAB, no wheezes, rales, or rhonchi  Abdominal: Soft. Non-tender, non-distended, bowel sounds are normal, no masses, organomegaly, or guarding present.  Hematology: no cervical LAD  Neurological: A&O x3, Strength is normal and symmetric bilaterally,  cranial nerve II-XII are grossly intact, no focal motor deficit, sensory intact to light touch bilaterally.  Skin: Warm, dry and intact. No rash, cyanosis, or clubbing.  Ext: 2+ DP pulses bilaterally Psychiatric: Normal mood and affect. . Assessment & Plan:   Please see problem-based charting for assessment and plan.

## 2012-07-16 NOTE — Assessment & Plan Note (Addendum)
Recheck lipid panel today. On pravastatin.   ADDENDUM 07/17/2012 11:46 AM  Lab Results  Component Value Date   CHOL 106 07/16/2012   HDL 40 07/16/2012   LDLCALC 57 07/16/2012   TRIG 44 07/16/2012   CHOLHDL 2.7 07/16/2012   Improved HDL. LDL at goal. Continue therapy. Will mail letter w results.

## 2012-07-16 NOTE — Assessment & Plan Note (Addendum)
Lab Results  Component Value Date   HGBA1C 6.7 07/16/2012   HGBA1C 6.3 02/20/2012   HGBA1C 8.1 10/10/2011     Assessment: Diabetes control: good control (HgbA1C at goal) Progress toward A1C goal:  at goal Comments: Metformin 1000 BID  Plan: Medications:  continue current medications Home glucose monitoring: Frequency: 2 times a day Timing: before breakfast;after dinner Instruction/counseling given: reminded to get eye exam, reminded to bring blood glucose meter & log to each visit and discussed the need for weight loss Educational resources provided: brochure Self management tools provided: copy of home glucose meter download   Commended weight loss, recommended addition of 30 min walking per day. Pt says she and mom can walk at mall when it gets too hot in the summer.

## 2012-07-17 LAB — COMPLETE METABOLIC PANEL WITH GFR
AST: 15 U/L (ref 0–37)
Albumin: 4.5 g/dL (ref 3.5–5.2)
BUN: 8 mg/dL (ref 6–23)
Calcium: 9.4 mg/dL (ref 8.4–10.5)
Chloride: 101 mEq/L (ref 96–112)
Creat: 0.44 mg/dL — ABNORMAL LOW (ref 0.50–1.10)
GFR, Est Non African American: >89 mL/min
Glucose, Bld: 125 mg/dL — ABNORMAL HIGH (ref 70–99)
Sodium: 140 mEq/L (ref 135–145)
Total Protein: 7.7 g/dL (ref 6.0–8.3)

## 2012-07-17 LAB — LIPID PANEL
Cholesterol: 106 mg/dL (ref 0–200)
HDL: 40 mg/dL (ref >39)
LDL Cholesterol: 57 mg/dL (ref 0–99)
Triglycerides: 44 mg/dL (ref <150)
VLDL: 9 mg/dL (ref 0–40)

## 2012-07-17 NOTE — Progress Notes (Signed)
Case discussed with Dr. Ziemer immediately after the resident saw the patient.  We reviewed the resident's history and exam and pertinent patient test results.  I agree with the assessment, diagnosis and plan of care documented in the resident's note. 

## 2012-07-31 ENCOUNTER — Encounter (INDEPENDENT_AMBULATORY_CARE_PROVIDER_SITE_OTHER): Payer: Self-pay | Admitting: Ophthalmology

## 2012-07-31 DIAGNOSIS — H3530 Unspecified macular degeneration: Secondary | ICD-10-CM

## 2012-07-31 DIAGNOSIS — E1165 Type 2 diabetes mellitus with hyperglycemia: Secondary | ICD-10-CM

## 2012-07-31 DIAGNOSIS — H2510 Age-related nuclear cataract, unspecified eye: Secondary | ICD-10-CM

## 2012-07-31 DIAGNOSIS — E1139 Type 2 diabetes mellitus with other diabetic ophthalmic complication: Secondary | ICD-10-CM

## 2012-07-31 DIAGNOSIS — H43819 Vitreous degeneration, unspecified eye: Secondary | ICD-10-CM

## 2012-07-31 DIAGNOSIS — E11319 Type 2 diabetes mellitus with unspecified diabetic retinopathy without macular edema: Secondary | ICD-10-CM

## 2012-10-01 ENCOUNTER — Other Ambulatory Visit (HOSPITAL_BASED_OUTPATIENT_CLINIC_OR_DEPARTMENT_OTHER): Payer: Medicaid Other | Admitting: Lab

## 2012-10-01 ENCOUNTER — Ambulatory Visit (HOSPITAL_BASED_OUTPATIENT_CLINIC_OR_DEPARTMENT_OTHER): Payer: Medicaid Other | Admitting: Oncology

## 2012-10-01 ENCOUNTER — Telehealth: Payer: Self-pay | Admitting: Oncology

## 2012-10-01 VITALS — BP 128/84 | HR 80 | Temp 98.2°F | Resp 18 | Ht 61.0 in | Wt 178.8 lb

## 2012-10-01 DIAGNOSIS — D500 Iron deficiency anemia secondary to blood loss (chronic): Secondary | ICD-10-CM

## 2012-10-01 DIAGNOSIS — D573 Sickle-cell trait: Secondary | ICD-10-CM

## 2012-10-01 DIAGNOSIS — Z862 Personal history of diseases of the blood and blood-forming organs and certain disorders involving the immune mechanism: Secondary | ICD-10-CM

## 2012-10-01 LAB — CBC WITH DIFFERENTIAL/PLATELET
BASO%: 0.4 % (ref 0.0–2.0)
EOS%: 1.1 % (ref 0.0–7.0)
HCT: 34.4 % — ABNORMAL LOW (ref 34.8–46.6)
MCH: 28.8 pg (ref 25.1–34.0)
MCHC: 34 g/dL (ref 31.5–36.0)
MONO#: 0.2 10*3/uL (ref 0.1–0.9)
NEUT%: 63.9 % (ref 38.4–76.8)
RBC: 4.06 10*6/uL (ref 3.70–5.45)
RDW: 14.3 % (ref 11.2–14.5)
WBC: 4.2 10*3/uL (ref 3.9–10.3)
lymph#: 1.2 10*3/uL (ref 0.9–3.3)

## 2012-10-01 NOTE — Progress Notes (Signed)
   [facility]    OFFICE PROGRESS NOTE   INTERVAL HISTORY:   She returns as scheduled. No complaint. She is followed in the internal medicine clinic for diabetes. She is scheduled for a mammogram later this month. No dysphagia.  Objective:  Vital signs in last 24 hours:  Blood pressure 128/84, pulse 80, temperature 98.2 F (36.8 C), temperature source Oral, resp. rate 18, height 5\' 1"  (1.549 m), weight 178 lb 12.8 oz (81.103 kg), last menstrual period 10/31/2005.    HEENT: Neck without mass Lymphatics: No cervical or supraclavicular nodes Resp: Lungs clear bilaterally Cardio: Regular rate and rhythm GI: No hepatosplenomegaly Vascular: No leg edema Breasts: Surgical site at the inferior/medial right breast with a small scar/keloid    Lab Results:  Lab Results  Component Value Date   WBC 4.2 10/01/2012   HGB 11.7 10/01/2012   HCT 34.4* 10/01/2012   MCV 84.9 10/01/2012   PLT 257 10/01/2012   ANC 2.7    Medications: I have reviewed the patient's current medications.  Assessment/Plan: 1. History of iron deficiency anemia. The hemoglobin remains is in the low normal range. The MCV is also in the normal range. 2. Sickle cell trait. 3. Status post hysterectomy.       4. History of dysphagia. She underwent an endoscopy by Dr. Loreta Ave.    Disposition:  The hemoglobin is slightly lower compared to last year, but not significantly changed over several years. She will obtain an influenza vaccine via the medicine clinic. Ms. Kolk would like to continue followup here. She will return for an office visit and CBC in one year.   Thornton Papas, MD  10/01/2012  12:35 PM

## 2012-10-01 NOTE — Telephone Encounter (Signed)
Gave pt appt for lab and MD September 2015 °

## 2012-10-04 ENCOUNTER — Ambulatory Visit (INDEPENDENT_AMBULATORY_CARE_PROVIDER_SITE_OTHER): Payer: Medicaid Other | Admitting: Internal Medicine

## 2012-10-04 ENCOUNTER — Encounter: Payer: Self-pay | Admitting: Internal Medicine

## 2012-10-04 VITALS — BP 126/73 | HR 93 | Temp 98.4°F | Ht 62.5 in | Wt 179.3 lb

## 2012-10-04 DIAGNOSIS — E119 Type 2 diabetes mellitus without complications: Secondary | ICD-10-CM

## 2012-10-04 DIAGNOSIS — M25519 Pain in unspecified shoulder: Secondary | ICD-10-CM

## 2012-10-04 DIAGNOSIS — M25512 Pain in left shoulder: Secondary | ICD-10-CM | POA: Insufficient documentation

## 2012-10-04 DIAGNOSIS — Z23 Encounter for immunization: Secondary | ICD-10-CM

## 2012-10-04 LAB — POCT GLYCOSYLATED HEMOGLOBIN (HGB A1C): Hemoglobin A1C: 5.6

## 2012-10-04 LAB — GLUCOSE, CAPILLARY: Glucose-Capillary: 129 mg/dL — ABNORMAL HIGH (ref 70–99)

## 2012-10-04 NOTE — Patient Instructions (Signed)
General Instructions: 1. Please make a follow-up appointment for 6 weeks. I have put in a referral for sports medicine and they will call you with an appointment to discuss further management of your shoulder pain. 2. Please take all medications as prescribed.  3. If you have worsening of your symptoms or new symptoms arise, please call the clinic ([phone]), or go to the ER immediately if symptoms are severe.  You have done great job in taking all your medications. I appreciate it very much. Please continue doing that.  Treatment Goals:  Goals (1 Years of Data) as of 10/04/12         As of Today 10/01/12 07/16/12 02/20/12 10/10/11     Blood Pressure    . Blood Pressure < 140/90  126/73 128/84 122/77 125/81 132/84     Result Component    . HEMOGLOBIN A1C < 7.0  5.6  6.7 6.3 8.1    . LDL CALC < 100    57        Progress Toward Treatment Goals:  Treatment Goal 10/04/2012  Hemoglobin A1C at goal    Self Care Goals & Plans:  Self Care Goal 10/04/2012  Manage my medications take my medicines as prescribed; bring my medications to every visit  Monitor my health keep track of my blood glucose  Eat healthy foods -    Home Blood Glucose Monitoring 07/16/2012  Check my blood sugar 2 times a day  When to check my blood sugar before breakfast; after dinner     Care Management & Community Referrals:  Referral 10/04/2012  Referrals made for care management support none needed
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
## 2012-10-04 NOTE — Assessment & Plan Note (Signed)
Well controlled w/ A1c of 5.6 today. No barriers in management at this time.  -Continue Metformin 1000 mg.
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
## 2012-10-05 NOTE — Progress Notes (Signed)
I saw and evaluated the patient.  I personally confirmed the key portions of the history and exam documented by Dr. Jones and I reviewed pertinent patient test results.  The assessment, diagnosis, and plan were formulated together and I agree with the documentation in the resident's note.   

## 2012-10-07 ENCOUNTER — Telehealth: Payer: Self-pay | Admitting: Dietician

## 2012-10-18 ENCOUNTER — Ambulatory Visit: Payer: Medicaid Other | Admitting: Family Medicine

## 2012-10-21 ENCOUNTER — Encounter: Payer: Self-pay | Admitting: Family Medicine

## 2012-10-21 ENCOUNTER — Ambulatory Visit (INDEPENDENT_AMBULATORY_CARE_PROVIDER_SITE_OTHER): Payer: Medicaid Other | Admitting: Family Medicine

## 2012-10-21 VITALS — BP 130/85 | HR 85 | Ht 62.5 in | Wt 179.0 lb

## 2012-10-21 DIAGNOSIS — M7502 Adhesive capsulitis of left shoulder: Secondary | ICD-10-CM

## 2012-10-21 DIAGNOSIS — M7500 Adhesive capsulitis of unspecified shoulder: Secondary | ICD-10-CM

## 2012-10-21 MED ORDER — METHYLPREDNISOLONE ACETATE 80 MG/ML IJ SUSP
80.0000 mg | Freq: Once | INTRAMUSCULAR | Status: AC
  Administered 2012-10-21: 80 mg via INTRA_ARTICULAR

## 2012-10-22 DIAGNOSIS — M7502 Adhesive capsulitis of left shoulder: Secondary | ICD-10-CM | POA: Insufficient documentation
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
## 2012-10-28 ENCOUNTER — Other Ambulatory Visit: Payer: Self-pay

## 2012-10-31 ENCOUNTER — Other Ambulatory Visit: Payer: Self-pay

## 2012-10-31 DIAGNOSIS — Z1231 Encounter for screening mammogram for malignant neoplasm of breast: Secondary | ICD-10-CM

## 2012-11-12 ENCOUNTER — Encounter: Payer: Medicaid Other | Admitting: Dietician

## 2012-11-12 ENCOUNTER — Encounter: Payer: Self-pay | Admitting: Internal Medicine

## 2012-11-12 ENCOUNTER — Ambulatory Visit (INDEPENDENT_AMBULATORY_CARE_PROVIDER_SITE_OTHER): Payer: Self-pay | Admitting: Internal Medicine

## 2012-11-12 VITALS — BP 137/94 | HR 88 | Temp 97.8°F | Ht 62.0 in | Wt 182.3 lb

## 2012-11-12 DIAGNOSIS — E119 Type 2 diabetes mellitus without complications: Secondary | ICD-10-CM

## 2012-11-12 DIAGNOSIS — M7500 Adhesive capsulitis of unspecified shoulder: Secondary | ICD-10-CM

## 2012-11-12 DIAGNOSIS — M7502 Adhesive capsulitis of left shoulder: Secondary | ICD-10-CM

## 2012-11-12 MED ORDER — MELOXICAM 7.5 MG PO TABS: 7.5000 mg | ORAL_TABLET | Freq: Every day | ORAL | Status: DC | PRN
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
## 2012-11-12 NOTE — Patient Instructions (Signed)
General Instructions: For your shoulder pain, continue to follow-up with Physical Therapy and Sports Medicine. -if you continue to have pain, you may use Meloxicam, 1 tablet per day as needed  Please return for a follow-up visit in 3 months.   Treatment Goals:  Goals (1 Years of Data) as of 11/12/12         As of Today 10/21/12 10/04/12 10/01/12 07/16/12     Blood Pressure    . Blood Pressure < 140/90  137/94 130/85 126/73 128/84 122/77     Result Component    . HEMOGLOBIN A1C < 7.0    5.6  6.7    . LDL CALC < 100      57      Progress Toward Treatment Goals:  Treatment Goal 11/12/2012  Hemoglobin A1C at goal    Self Care Goals & Plans:  Self Care Goal 11/12/2012  Manage my medications take my medicines as prescribed; refill my medications on time; bring my medications to every visit  Monitor my health keep track of my blood glucose; bring my glucose meter and log to each visit  Eat healthy foods -    Home Blood Glucose Monitoring 11/12/2012  Check my blood sugar once a day  When to check my blood sugar before meals     Care Management & Community Referrals:  Referral 11/12/2012  Referrals made for care management support none needed

## 2012-11-12 NOTE — Assessment & Plan Note (Addendum)
Lab Results  Component Value Date   HGBA1C 5.6 10/04/2012   HGBA1C 6.7 07/16/2012   HGBA1C 6.3 02/20/2012     Assessment: Diabetes control: good control (HgbA1C at goal) Progress toward A1C goal:  at goal Comments: A1c well-controlled  Plan: Medications:  continue current medications Home glucose monitoring: Frequency: once a day Timing: before meals Instruction/counseling given: reminded to bring blood glucose meter & log to each visit Educational resources provided:   Self management tools provided:   Other plans: Will check urine microalbumin today   Addendum 11/13/12: Urine microalb/Cr ratio returned mildly elevated, though no history of microalbuminuria in the past.  Will repeat at next visit to ensure accuracy.  If it remains elevated, will need to start ACE-I.

## 2012-11-12 NOTE — Assessment & Plan Note (Signed)
The patient notes that symptoms persist, but are improving.  The patient is well established with sports med and PT. -continue sports med and PT as scheduled -will add meloxicam prn for pain

## 2012-11-13 LAB — MICROALBUMIN / CREATININE URINE RATIO
Creatinine, Urine: 13.3 mg/dL
Microalb Creat Ratio: 37.6 mg/g — ABNORMAL HIGH (ref 0.0–30.0)
Microalb, Ur: 0.5 mg/dL (ref 0.00–1.89)

## 2012-11-13 NOTE — Progress Notes (Signed)
Case discussed with Dr. Brown at the time of the visit.  We reviewed the resident's history and exam and pertinent patient test results.  I agree with the assessment, diagnosis and plan of care documented in the resident's note. 

## 2012-11-15 ENCOUNTER — Ambulatory Visit (INDEPENDENT_AMBULATORY_CARE_PROVIDER_SITE_OTHER): Payer: Self-pay | Admitting: Family Medicine

## 2012-11-15 ENCOUNTER — Encounter: Payer: Self-pay | Admitting: Family Medicine

## 2012-11-15 VITALS — BP 113/79 | Ht 62.0 in | Wt 179.0 lb

## 2012-11-15 DIAGNOSIS — M7500 Adhesive capsulitis of unspecified shoulder: Secondary | ICD-10-CM

## 2012-11-15 DIAGNOSIS — M7502 Adhesive capsulitis of left shoulder: Secondary | ICD-10-CM
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
## 2012-11-15 NOTE — Assessment & Plan Note (Signed)
Discussed serial injections and physical therapy foundation treatment. Patient will continue physical therapy. She will not have another injection at this time and she elected to wait and see how well she does with range of motion exercises.

## 2012-11-19 ENCOUNTER — Other Ambulatory Visit: Payer: Self-pay | Admitting: *Deleted

## 2012-11-19 ENCOUNTER — Ambulatory Visit: Payer: Medicaid Other

## 2012-11-20 MED ORDER — GLUCOSE BLOOD VI STRP: ORAL_STRIP | Status: DC

## 2012-12-10 ENCOUNTER — Ambulatory Visit
Admission: RE | Admit: 2012-12-10 | Discharge: 2012-12-10 | Disposition: A | Discharge status: Home / Self Care | Payer: Medicaid Other | Source: Ambulatory Visit

## 2012-12-10 DIAGNOSIS — Z1231 Encounter for screening mammogram for malignant neoplasm of breast: Secondary | ICD-10-CM

## 2012-12-23 ENCOUNTER — Other Ambulatory Visit: Payer: Self-pay | Admitting: *Deleted

## 2012-12-23 DIAGNOSIS — E786 Lipoprotein deficiency: Secondary | ICD-10-CM

## 2012-12-23 MED ORDER — PRAVASTATIN SODIUM 20 MG PO TABS
20.0000 mg | ORAL_TABLET | Freq: Every evening | ORAL | Status: DC
Start: 2012-12-23 — End: 2014-02-07

## 2013-03-02 ENCOUNTER — Other Ambulatory Visit: Payer: Self-pay | Admitting: Internal Medicine

## 2013-04-04 ENCOUNTER — Other Ambulatory Visit: Payer: Self-pay | Admitting: Internal Medicine

## 2013-04-30 ENCOUNTER — Other Ambulatory Visit: Payer: Self-pay | Admitting: *Deleted

## 2013-04-30 DIAGNOSIS — D573 Sickle-cell trait: Secondary | ICD-10-CM

## 2013-04-30 MED ORDER — METFORMIN HCL 1000 MG PO TABS: 1000.0000 mg | ORAL_TABLET | Freq: Every day | ORAL | Status: DC

## 2013-05-06 ENCOUNTER — Other Ambulatory Visit: Payer: Self-pay | Admitting: Internal Medicine

## 2013-06-10 ENCOUNTER — Encounter: Payer: Self-pay | Admitting: Internal Medicine

## 2013-06-10 ENCOUNTER — Ambulatory Visit (INDEPENDENT_AMBULATORY_CARE_PROVIDER_SITE_OTHER): Payer: Medicaid Other | Admitting: Internal Medicine

## 2013-06-10 VITALS — BP 161/98 | HR 108 | Temp 100.5°F | Ht 63.0 in | Wt 192.2 lb

## 2013-06-10 DIAGNOSIS — M25512 Pain in left shoulder: Secondary | ICD-10-CM

## 2013-06-10 DIAGNOSIS — E119 Type 2 diabetes mellitus without complications: Secondary | ICD-10-CM

## 2013-06-10 DIAGNOSIS — R030 Elevated blood-pressure reading, without diagnosis of hypertension: Secondary | ICD-10-CM

## 2013-06-10 DIAGNOSIS — IMO0001 Reserved for inherently not codable concepts without codable children: Secondary | ICD-10-CM

## 2013-06-10 DIAGNOSIS — M25519 Pain in unspecified shoulder: Secondary | ICD-10-CM

## 2013-06-10 LAB — GLUCOSE, CAPILLARY: Glucose-Capillary: 110 mg/dL — ABNORMAL HIGH (ref 70–99)

## 2013-06-10 LAB — POCT GLYCOSYLATED HEMOGLOBIN (HGB A1C): Hemoglobin A1C: 6.1

## 2013-06-10 NOTE — Progress Notes (Signed)
Patient ID: Jaime [name], female   DOB: [birth year], 48 y.o.   MRN: [id number]   Subjective:   Patient ID: Jaime [name] female   DOB: May 27, 1965 48 y.o.   MRN: [id number]  HPI: [name] is a 48 y.o. F w/ PMHx of DM type II, Sickle cell trait, obesity, and developmental delay, presents to the clinic w/ her mother for a follow up visit. The patient claims she is not having any complaints lately. During her last clinic visit, the patient was complaining of right shoulder pain, which has improved significantly and well controlled w/ infrequent Mobic use. She otherwise has no issues. The patient has a h/o well controlled DM, on Metformin 1000 mg qd, last HbA1c 5.6. She denies symptoms of hypoglycemia; no diaphoresis, tremulousness, lightheadedness or dizziness. On repeat today, HbA1c was 6.7. Discussed diet and exercise.  Ms. Filippone was found to have elevated BP today of 168/100, repeat of 161/98. She has not been found to have significantly elevated BP in the past. She denies headache or vision changes.   Past Medical History  Diagnosis Date  . DUB (dysfunctional uterine bleeding)     total abdominal hysterectomy in 2007, ovaries intact  . Furuncle of multiple sites     multiple I and D's  . Diabetes mellitus   . GERD (gastroesophageal reflux disease)   . Hydradenitis    Current Outpatient Prescriptions  Medication Sig Dispense Refill  . ACCU-CHEK FASTCLIX LANCETS MISC 1 Device by Does not apply route 1 day or 1 dose. Dx: 250.00  1 each  11  . Blood Glucose Monitoring Suppl (ACCU-CHEK NANO SMARTVIEW) W/DEVICE KIT 1 Device by Does not apply route 1 day or 1 dose. Dx; 250.00  1 kit  1  . glucose blood test strip Use as instructed  100 each  12  . meloxicam (MOBIC) 7.5 MG tablet TAKE 1 TABLET BY MOUTH EVERY DAY AS NEEDED FOR PAIN  30 tablet  1  . metFORMIN (GLUCOPHAGE) 1000 MG tablet Take 1 tablet (1,000 mg total) by mouth daily with breakfast.  90 tablet  3  . pravastatin (PRAVACHOL)  20 MG tablet Take 1 tablet (20 mg total) by mouth every evening.  30 tablet  11   No current facility-administered medications for this visit.   Family History  Problem Relation Age of Onset  . Cancer Mother 81    Breast cancer  . Hypertension Mother   . Depression Mother   . Sickle cell trait Mother   . Cancer Father 78    lung cancer, was a long term smoker   History   Social History  . Marital Status: Single    Spouse Name: N/A    Number of Children: N/A  . Years of Education: N/A   Social History Main Topics  . Smoking status: Never Smoker   . Smokeless tobacco: None  . Alcohol Use: No  . Drug Use: No  . Sexual Activity: None   Other Topics Concern  . 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   No kids.   No sexual activity   Lives with her mother   Review of Systems: General: Denies fever, chills, diaphoresis, appetite change and fatigue.  HEENT: Denies change in vision, eye pain, redness, hearing loss, congestion, sore throat, rhinorrhea, sneezing, mouth sores, trouble swallowing, neck stiffness and tinnitus.   Respiratory:  Denies SOB, DOE, cough, chest tightness, and wheezing.   Cardiovascular: Denies chest pain, palpitations and leg swelling.  Gastrointestinal: Denies nausea, vomiting, abdominal pain, diarrhea, constipation, blood in stool and abdominal distention.  Genitourinary: Denies dysuria, urgency, frequency, hematuria, flank pain and difficulty urinating.  Endocrine: Denies hot or cold intolerance, sweats, polyuria, polydipsia. Musculoskeletal: Denies myalgias, back pain, joint swelling, arthralgias and gait problem.  Skin: Denies pallor, rash and wounds.  Neurological: Denies dizziness, seizures, syncope, weakness, lightheadedness, numbness and headaches.  Hematological: Denies adenopathy,easy bruising, personal or family bleeding  history.  Psychiatric/Behavioral: Denies mood changes, confusion, nervousness, sleep disturbance and agitation.  Objective:  Physical Exam: Filed Vitals:   06/10/13 1600 06/10/13 1650  BP: 168/100 161/98  Pulse: 108 108  Temp: 100.5 F (38.1 C)   TempSrc: Oral   Height: 5' 3"  (1.6 m)   Weight: 192 lb 3.2 oz (87.181 kg)   SpO2: 97%    General: Vital signs reviewed.  Patient is a well-developed and well-nourished, in no acute distress and cooperative with exam. Alert and oriented x3.  Head: Normocephalic and atraumatic. Nose: No erythema or drainage noted.  Turbinates normal. Mouth: No erythema, exudates, sores, or ulcerations. Moist mucus membranes. Eyes: PERRL, EOMI, conjunctivae normal, No scleral icterus.  Neck: Supple, trachea midline, normal ROM, No JVD, masses, thyromegaly, or carotid bruit present.  Cardiovascular: RRR, S1 normal, S2 normal, no murmurs, gallops, or rubs. Pulmonary/Chest: normal respiratory effort, CTAB, no wheezes, rales, or rhonchi. Abdominal: Soft. Non-tender, non-distended, bowel sounds are normal, no masses, organomegaly, or guarding present.  Musculoskeletal: Passive and active ROM intact in all extremities. Left shoulder significantly improved since last visit. Extremities: No swelling or edema,  pulses symmetric and intact bilaterally. No cyanosis or clubbing. Neurological: A&O x3, Strength is normal and symmetric bilaterally, cranial nerve II-XII are grossly intact, no focal motor deficit, sensory intact to light touch bilaterally.  Skin: Warm, dry and intact. No rashes or erythema. Psychiatric: Normal mood and affect. Speech and behavior is normal. Cognition mildly impaired, memory normal.   Assessment & Plan:   Please see problem based assessment and plan.

## 2013-06-10 NOTE — Assessment & Plan Note (Addendum)
Lab Results  Component Value Date   HGBA1C 6.1 06/10/2013   HGBA1C 5.6 10/04/2012   HGBA1C 6.7 07/16/2012     Assessment: Diabetes control: good control (HgbA1C at goal) Progress toward A1C goal:  at goal Comments: Patient says she is complaint w/ Metformin 1000 mg po qd. Discussed diet and exercise at length.  Plan: Medications:  continue current medications Home glucose monitoring: Frequency: once a day Timing: before breakfast;before lunch Instruction/counseling given: reminded to bring blood glucose meter & log to each visit Educational resources provided: brochure Self management tools provided: copy of home glucose meter download Other plans: Checked Urine Microalbumin/Cr today, found to be 27.6, improved from last visit. If patient continues to have elevated BP at next visit, will start ACEI.

## 2013-06-10 NOTE — Patient Instructions (Addendum)
General Instructions: 1. Please schedule a follow up appointment for ~2 weeks for BP follow up.  PLEASE CALL CLINIC IN 5-7 DAYS w/ BP CHECK.  2. Please take all medications as prescribed.   3. If you have worsening of your symptoms or new symptoms arise, please call the clinic ([phone]), or go to the ER immediately if symptoms are severe.  You have done a great job in taking all your medications. I appreciate it very much. Please continue doing that.   Please bring your medicines with you each time you come to clinic.  Medicines may include prescription medications, over-the-counter medications, herbal remedies, eye drops, vitamins, or other pills.  Progress Toward Treatment Goals:  Treatment Goal 06/10/2013  Hemoglobin A1C at goal    Self Care Goals & Plans:  Self Care Goal 06/10/2013  Manage my medications take my medicines as prescribed; bring my medications to every visit; refill my medications on time  Monitor my health keep track of my blood glucose; bring my glucose meter and log to each visit  Eat healthy foods drink diet soda or water instead of juice or soda; eat more vegetables; eat foods that are low in salt; eat baked foods instead of fried foods; eat fruit for snacks and desserts  Meeting treatment goals maintain the current self-care plan    Home Blood Glucose Monitoring 06/10/2013  Check my blood sugar once a day  When to check my blood sugar before breakfast; before lunch     Care Management & Community Referrals:  Referral 06/10/2013  Referrals made for care management support none needed  Referrals made to community resources none

## 2013-06-11 ENCOUNTER — Encounter: Payer: Self-pay | Admitting: Internal Medicine

## 2013-06-11 DIAGNOSIS — I10 Essential (primary) hypertension: Secondary | ICD-10-CM | POA: Insufficient documentation

## 2013-06-11 LAB — MICROALBUMIN / CREATININE URINE RATIO
Creatinine, Urine: 77.2 mg/dL
Microalb Creat Ratio: 27.6 mg/g (ref 0.0–30.0)
Microalb, Ur: 2.13 mg/dL — ABNORMAL HIGH (ref 0.00–1.89)

## 2013-06-11 NOTE — Assessment & Plan Note (Signed)
Patient presents today w/ BP of 168/100, repeat 161/98. Patient has not had significantly elevated BP in the past. Not on any medications for HTN.  -Patient to re-check BP in 5-7 days and call [facility] clinic w/ results; will also follow this up myself. -To return in ~2 weeks for BP follow up. -Given h/o DM, may start ACEI at next visit if BP continues to be elevated.

## 2013-06-11 NOTE — Assessment & Plan Note (Signed)
No issues currently, significantly improved since last visit.  -Continue Mobic prn for infrequent pain

## 2013-06-13 ENCOUNTER — Telehealth: Payer: Self-pay | Admitting: Internal Medicine

## 2013-06-13 ENCOUNTER — Other Ambulatory Visit: Payer: Self-pay | Admitting: Internal Medicine

## 2013-06-13 NOTE — Telephone Encounter (Signed)
Spoke w/ Ms. Lafever' mother on the phone today w/ a BP update. They checked her BP this AM and it was 151/89. Instructed her to make a follow up appointment for 2 weeks and to continue recording BP readings until that time. At next visit, an ACEI can be started given her recent history of mild microalbuminuria in the setting of DM.   Signed: Corky Sox, MD 06/13/2013 11:05 AM

## 2013-06-19 NOTE — Progress Notes (Signed)
Case discussed with Dr. Jones at the time of the visit.  We reviewed the resident's history and exam and pertinent patient test results.  I agree with the assessment, diagnosis, and plan of care documented in the resident's note. 

## 2013-07-07 ENCOUNTER — Ambulatory Visit (INDEPENDENT_AMBULATORY_CARE_PROVIDER_SITE_OTHER): Payer: Medicaid Other | Admitting: Internal Medicine

## 2013-07-07 ENCOUNTER — Encounter: Payer: Self-pay | Admitting: Internal Medicine

## 2013-07-07 VITALS — BP 140/80 | HR 100 | Temp 97.2°F | Ht 63.0 in | Wt 186.1 lb

## 2013-07-07 DIAGNOSIS — R030 Elevated blood-pressure reading, without diagnosis of hypertension: Secondary | ICD-10-CM

## 2013-07-07 DIAGNOSIS — IMO0001 Reserved for inherently not codable concepts without codable children: Secondary | ICD-10-CM

## 2013-07-07 DIAGNOSIS — M25519 Pain in unspecified shoulder: Secondary | ICD-10-CM

## 2013-07-07 DIAGNOSIS — E119 Type 2 diabetes mellitus without complications: Secondary | ICD-10-CM

## 2013-07-07 DIAGNOSIS — M25512 Pain in left shoulder: Secondary | ICD-10-CM

## 2013-07-07 MED ORDER — LISINOPRIL 5 MG PO TABS: 5.0000 mg | ORAL_TABLET | Freq: Every day | ORAL | Status: DC

## 2013-07-07 MED ORDER — METFORMIN HCL 1000 MG PO TABS: 1000.0000 mg | ORAL_TABLET | Freq: Two times a day (BID) | ORAL | Status: DC

## 2013-07-07 NOTE — Progress Notes (Signed)
Subjective:   Patient ID: NORMALEE SISTARE female   DOB: Sep 25, 1965 48 y.o.   MRN: [id number]  HPI: Ms.Raelynne A Zirkle is a 48 y.o. F w/ PMHx of DM type II, Sickle cell trait, obesity, and developmental delay, presents to the clinic w/ her mother for a follow up visit. Patient was most recently seen on 06/10/13 for a follow up visit, found to have elevated BP at that time. Patient has not had issues w/ HTN in the past and has never taken anti-HTN medications before.  Today, Ms. Helinski has no complaints. She has been keeping track of her BP since her most recent visit and shows averages around 155/95. No headaches, dizziness, vision changes, weakness, or sensory dysfunction.  Otherwise, the patient is doing quite well. She has lost weight since her last visit and says she has been watching her diet and exercising. She also claims the her left shoulder shoulder pain is no longer an issue for her and has not taken Mobic for quite some time.   Past Medical History  Diagnosis Date  . DUB (dysfunctional uterine bleeding)     total abdominal hysterectomy in 2007, ovaries intact  . Furuncle of multiple sites     multiple I and D's  . Diabetes mellitus   . GERD (gastroesophageal reflux disease)   . Hydradenitis    Current Outpatient Prescriptions  Medication Sig Dispense Refill  . ACCU-CHEK FASTCLIX LANCETS MISC 1 Device by Does not apply route 1 day or 1 dose. Dx: 250.00  1 each  11  . Blood Glucose Monitoring Suppl (ACCU-CHEK NANO SMARTVIEW) W/DEVICE KIT 1 Device by Does not apply route 1 day or 1 dose. Dx; 250.00  1 kit  1  . glucose blood test strip Use as instructed  100 each  12  . lisinopril (PRINIVIL,ZESTRIL) 5 MG tablet Take 1 tablet (5 mg total) by mouth daily.  30 tablet  5  . metFORMIN (GLUCOPHAGE) 1000 MG tablet Take 1 tablet (1,000 mg total) by mouth 2 (two) times daily with a meal.      . pravastatin (PRAVACHOL) 20 MG tablet Take 1 tablet (20 mg total) by mouth every evening.  30 tablet   11   No current facility-administered medications for this visit.   Family History  Problem Relation Age of Onset  . Cancer Mother 73    Breast cancer  . Hypertension Mother   . Depression Mother   . Sickle cell trait Mother   . Cancer Father 20    lung cancer, was a long term smoker   History   Social History  . Marital Status: Single    Spouse Name: N/A    Number of Children: N/A  . Years of Education: N/A   Social History Main Topics  . Smoking status: Never Smoker   . Smokeless tobacco: None  . Alcohol Use: No  . Drug Use: No  . Sexual Activity: None   Other Topics Concern  . 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   No kids.   No sexual activity   Lives with her mother    Review of Systems: General: Denies fever, chills, diaphoresis, appetite change and fatigue.  HEENT: Denies change in vision, eye pain, redness, hearing loss, congestion, sore throat, rhinorrhea, sneezing, mouth sores, trouble swallowing, neck stiffness and tinnitus.   Respiratory: Denies SOB, DOE, cough,  chest tightness, and wheezing.   Cardiovascular: Denies chest pain, palpitations and leg swelling.  Gastrointestinal: Denies nausea, vomiting, abdominal pain, diarrhea, constipation, blood in stool and abdominal distention.  Genitourinary: Denies dysuria, urgency, frequency, hematuria, flank pain and difficulty urinating.  Endocrine: Denies hot or cold intolerance, sweats, polyuria, polydipsia. Musculoskeletal: Denies myalgias, back pain, joint swelling, arthralgias and gait problem.  Skin: Denies pallor, rash and wounds.  Neurological: Denies dizziness, seizures, syncope, weakness, lightheadedness, numbness and headaches.  Hematological: Denies adenopathy,easy bruising, personal or family bleeding history.  Psychiatric/Behavioral: Denies mood changes, confusion, nervousness,  sleep disturbance and agitation.  Objective:  Physical Exam: Filed Vitals:   07/07/13 1452 07/07/13 1507  BP: 153/84 140/80  Pulse: 102 100  Temp: 97.2 F (36.2 C)   TempSrc: Oral   Height: 5' 3"  (1.6 m)   Weight: 186 lb 1.6 oz (84.414 kg)   SpO2: 100%    General: Vital signs reviewed.  Patient is a well-developed and well-nourished, in no acute distress and cooperative with exam. Alert and oriented x3.  Head: Normocephalic and atraumatic. Nose: No erythema or drainage noted.  Turbinates normal. Mouth: No erythema, exudates, sores, or ulcerations. Moist mucus membranes. Eyes: PERRL, EOMI, conjunctivae normal, No scleral icterus.  Neck: Supple, trachea midline, normal ROM, No JVD, masses, thyromegaly, or carotid bruit present.  Cardiovascular: Tachycardic, S1 normal, S2 normal. 2/6 systolic murmur. No gallops or rubs. Pulmonary/Chest: normal respiratory effort, CTAB, no wheezes, rales, or rhonchi. Abdominal: Soft. Non-tender, non-distended, bowel sounds are normal, no masses, organomegaly, or guarding present.  Musculoskeletal: Passive and active ROM intact in all extremities.  Extremities: No swelling or edema,  pulses symmetric and intact bilaterally. No cyanosis or clubbing. Neurological: A&O x3, Strength is normal and symmetric bilaterally, cranial nerve II-XII are grossly intact, no focal motor deficit, sensory intact to light touch bilaterally.  Skin: Warm, dry and intact. No rashes or erythema. Psychiatric: Normal mood and affect. Speech and behavior is normal. Cognition mildly impaired, memory normal.   Assessment & Plan:   Please see problem based assessment and plan.

## 2013-07-07 NOTE — Patient Instructions (Addendum)
General Instructions:  1. Please schedule a follow up appointment for 2-4 weeks.  2. Please take all medications as prescribed. Start taking Lisinopril 5 mg daily.  Continue working on weight loss. GREAT JOB!!!  3. If you have worsening of your symptoms or new symptoms arise, please call the clinic [phone]), or go to the ER immediately if symptoms are severe.  You have done a great job in taking all your medications. I appreciate it very much. Please continue doing that.   Please bring your medicines with you each time you come to clinic.  Medicines may include prescription medications, over-the-counter medications, herbal remedies, eye drops, vitamins, or other pills.   Progress Toward Treatment Goals:  Treatment Goal 07/07/2013  Hemoglobin A1C at goal    Self Care Goals & Plans:  Self Care Goal 07/07/2013  Manage my medications take my medicines as prescribed  Monitor my health keep track of my blood glucose  Eat healthy foods drink diet soda or water instead of juice or soda; eat more vegetables; eat baked foods instead of fried foods  Be physically active find an activity I enjoy; take a walk every day  Meeting treatment goals maintain the current self-care plan    Home Blood Glucose Monitoring 06/10/2013  Check my blood sugar once a day  When to check my blood sugar before breakfast; before lunch     Care Management & Community Referrals:  Referral 07/07/2013  Referrals made for care management support none needed  Referrals made to community resources none

## 2013-07-07 NOTE — Assessment & Plan Note (Signed)
BP Readings from Last 3 Encounters:  07/07/13 140/80  06/10/13 161/98  11/15/12 113/79    Lab Results  Component Value Date   NA 140 07/16/2012   K 3.8 07/16/2012   CREATININE 0.44* 07/16/2012    Assessment: Comments: Patient shown to have elevated BP during last 2-3 clinic visits. Had patient check BP at home on several occasions since her last clinic visit, found to be average ~155/95. No anti-HTN medications in the past.   Plan: Medications:  Will start LOW dose Lisinopril for now; 5 mg po qd. Will likely titrate up to 10 mg po qd in the future.. Educational resources provided:   Self management tools provided:   Other plans: BMP today.  -Patient to continue working on weight loss w/ diet and exercise as this will also likely benefit her BP.

## 2013-07-07 NOTE — Assessment & Plan Note (Addendum)
Lab Results  Component Value Date   HGBA1C 6.1 06/10/2013   HGBA1C 5.6 10/04/2012   HGBA1C 6.7 07/16/2012     Assessment: Diabetes control: good control (HgbA1C at goal) Progress toward A1C goal:  at goal Comments: Compliant w/ Metformin 1000 mg BID  Plan: Medications:  continue current medications; Adding Lisinopril 5 mg po qd for BP and renal benefits in the setting of DM type II.  Home glucose monitoring: Frequency:   Timing:   Instruction/counseling given: discussed the need for weight loss and discussed diet Educational resources provided:   Self management tools provided:   Other plans: Repeat HbA1c in August.

## 2013-07-07 NOTE — Assessment & Plan Note (Signed)
Patient claims this has almost completely resolved. Says she has not required Mobic for quite some time.  -Discontinue Mobic

## 2013-07-08 LAB — BASIC METABOLIC PANEL WITH GFR
BUN: 7 mg/dL (ref 6–23)
CHLORIDE: 101 meq/L (ref 96–112)
CO2: 29 mEq/L (ref 19–32)
Calcium: 9.4 mg/dL (ref 8.4–10.5)
Creat: 0.43 mg/dL — ABNORMAL LOW (ref 0.50–1.10)
GFR, Est African American: >89 mL/min
GFR, Est Non African American: >89 mL/min
Glucose, Bld: 133 mg/dL — ABNORMAL HIGH (ref 70–99)
POTASSIUM: 4 meq/L (ref 3.5–5.3)
Sodium: 139 mEq/L (ref 135–145)

## 2013-07-08 NOTE — Progress Notes (Signed)
Case discussed with Dr. Jones at the time of the visit.  We reviewed the resident's history and exam and pertinent patient test results.  I agree with the assessment, diagnosis, and plan of care documented in the resident's note. 

## 2013-07-11 ENCOUNTER — Telehealth: Payer: Self-pay | Admitting: Oncology

## 2013-07-11 NOTE — Telephone Encounter (Signed)
Called pt and left message r/s appt to 9/24 due to MD's PAL

## 2013-07-22 ENCOUNTER — Ambulatory Visit (INDEPENDENT_AMBULATORY_CARE_PROVIDER_SITE_OTHER): Payer: Medicaid Other | Admitting: Internal Medicine

## 2013-07-22 ENCOUNTER — Encounter: Payer: Self-pay | Admitting: Internal Medicine

## 2013-07-22 VITALS — BP 130/83 | HR 96 | Temp 97.2°F | Ht 63.0 in | Wt 184.9 lb

## 2013-07-22 DIAGNOSIS — E119 Type 2 diabetes mellitus without complications: Secondary | ICD-10-CM

## 2013-07-22 DIAGNOSIS — R030 Elevated blood-pressure reading, without diagnosis of hypertension: Secondary | ICD-10-CM

## 2013-07-22 DIAGNOSIS — IMO0001 Reserved for inherently not codable concepts without codable children: Secondary | ICD-10-CM

## 2013-07-22 DIAGNOSIS — I10 Essential (primary) hypertension: Secondary | ICD-10-CM

## 2013-07-22 LAB — GLUCOSE, CAPILLARY: GLUCOSE-CAPILLARY: 125 mg/dL — AB (ref 70–99)

## 2013-07-22 NOTE — Patient Instructions (Signed)
1. Please schedule a follow up appointment for 2-3 months  2. Please take all medications as prescribed. Continue Lisinopril 5 mg daily as well as Metformin 100 mg two times daily.  3. If you have worsening of your symptoms or new symptoms arise, please call the clinic ([phone]), or go to the ER immediately if symptoms are severe.  You have done a great job in taking all your medications. I appreciate it very much. Please continue doing that.

## 2013-07-22 NOTE — Assessment & Plan Note (Signed)
BP Readings from Last 3 Encounters:  07/22/13 130/83  07/07/13 140/80  06/10/13 161/98    Lab Results  Component Value Date   NA 139 07/07/2013   K 4.0 07/07/2013   CREATININE 0.43* 07/07/2013    Assessment: Blood pressure control: controlled Progress toward BP goal:  at goal Comments: Started Lisinopril 5 mg po qd at last visit.   Plan: Medications:  continue current medications Educational resources provided:   Self management tools provided:   Other plans: Return to clinic in 2-3 months for HbA1c check. May need to increase Lisinopril to 10 mg at that time.

## 2013-07-22 NOTE — Progress Notes (Signed)
Subjective:   Patient ID: Jaime [name] female   DOB: [birth year] 48 y.o.   MRN: [id number]  HPI: [name] is a 48 y.o. F w/ PMHx of DM type II, Sickle cell trait, obesity, and developmental delay, presents to the clinic for follow up. Patient was last seen on 07/07/13 for follow up regarding recently elevated BP. At that visit, she was started on Lisinopril 5 mg po qd and has been very compliant w/ this and all her medications. She denies any significant adverse effects; denies cough, dizziness, lightheadedness, fever, chills, nausea, diarrhea, or SOB. No further issues.   Past Medical History  Diagnosis Date  . DUB (dysfunctional uterine bleeding)     total abdominal hysterectomy in 2007, ovaries intact  . Furuncle of multiple sites     multiple I and D's  . Diabetes mellitus   . GERD (gastroesophageal reflux disease)   . Hydradenitis    Current Outpatient Prescriptions  Medication Sig Dispense Refill  . ACCU-CHEK FASTCLIX LANCETS MISC 1 Device by Does not apply route 1 day or 1 dose. Dx: 250.00  1 each  11  . Blood Glucose Monitoring Suppl (ACCU-CHEK NANO SMARTVIEW) W/DEVICE KIT 1 Device by Does not apply route 1 day or 1 dose. Dx; 250.00  1 kit  1  . glucose blood test strip Use as instructed  100 each  12  . lisinopril (PRINIVIL,ZESTRIL) 5 MG tablet Take 1 tablet (5 mg total) by mouth daily.  30 tablet  5  . metFORMIN (GLUCOPHAGE) 1000 MG tablet Take 1 tablet (1,000 mg total) by mouth 2 (two) times daily with a meal.      . pravastatin (PRAVACHOL) 20 MG tablet Take 1 tablet (20 mg total) by mouth every evening.  30 tablet  11   No current facility-administered medications for this visit.   Family History  Problem Relation Age of Onset  . Cancer Mother 11    Breast cancer  . Hypertension Mother   . Depression Mother   . Sickle cell trait Mother   . Cancer Father 13    lung cancer, was a long term smoker   History   Social History  . Marital Status: Single   Spouse Name: N/A    Number of Children: N/A  . Years of Education: N/A   Social History Main Topics  . Smoking status: Never Smoker   . Smokeless tobacco: None  . Alcohol Use: No  . Drug Use: No  . Sexual Activity: None   Other Topics Concern  . 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   No kids.   No sexual activity   Lives with her mother    Review of Systems: General: Denies fever, chills, diaphoresis, appetite change and fatigue.  HEENT: Denies change in vision, eye pain, redness, hearing loss, congestion, sore throat, rhinorrhea, sneezing, mouth sores, trouble swallowing, neck stiffness and tinnitus.   Respiratory: Denies SOB, DOE, cough, chest tightness, and wheezing.   Cardiovascular: Denies chest pain, palpitations and leg swelling.  Gastrointestinal: Denies nausea, vomiting, abdominal pain, diarrhea, constipation, blood in stool and abdominal distention.  Genitourinary: Denies dysuria, urgency, frequency, hematuria, flank pain and difficulty urinating.  Endocrine: Denies hot or cold intolerance, sweats, polyuria, polydipsia. Musculoskeletal: Denies myalgias, back pain, joint swelling, arthralgias and gait problem.  Skin: Denies pallor, rash and wounds.  Neurological: Denies dizziness, seizures,  syncope, weakness, lightheadedness, numbness and headaches.  Hematological: Denies adenopathy,easy bruising, personal or family bleeding history.  Psychiatric/Behavioral: Denies mood changes, confusion, nervousness, sleep disturbance and agitation.  Objective:   Filed Vitals:   07/22/13 1348  BP: 130/83  Pulse: 96  Temp: 97.2 F (36.2 C)   General: Vital signs reviewed.  Patient is a well-developed and well-nourished, in no acute distress and cooperative with exam. Alert and oriented x3.  Head: Normocephalic and atraumatic. Nose: No erythema or drainage  noted.  Turbinates normal. Mouth: No erythema, exudates, sores, or ulcerations. Moist mucus membranes. Eyes: PERRL, EOMI, conjunctivae normal, No scleral icterus.  Neck: Supple, trachea midline, normal ROM, No JVD, masses, thyromegaly, or carotid bruit present.  Cardiovascular: Tachycardic, S1 normal, S2 normal. 2/6 systolic murmur. No gallops or rubs. Pulmonary/Chest: normal respiratory effort, CTAB, no wheezes, rales, or rhonchi. Abdominal: Soft. Non-tender, non-distended, bowel sounds are normal, no masses, organomegaly, or guarding present.  Musculoskeletal: Passive and active ROM intact in all extremities.  Extremities: No swelling or edema,  pulses symmetric and intact bilaterally. No cyanosis or clubbing. Neurological: A&O x3, Strength is normal and symmetric bilaterally, cranial nerve II-XII are grossly intact, no focal motor deficit, sensory intact to light touch bilaterally.  Skin: Warm, dry and intact. No rashes or erythema. Psychiatric: Normal mood and affect. Speech and behavior is normal. Cognition mildly impaired, memory normal.   Assessment & Plan:   Please see problem based assessment and plan.

## 2013-07-22 NOTE — Assessment & Plan Note (Signed)
Lab Results  Component Value Date   HGBA1C 6.1 06/10/2013   HGBA1C 5.6 10/04/2012   HGBA1C 6.7 07/16/2012     Assessment: Diabetes control: good control (HgbA1C at goal) Progress toward A1C goal:  at goal Comments: Takes Metformin 1000 mg bid.   Plan: Medications:  continue current medications Home glucose monitoring: Frequency: once a day Timing:   Instruction/counseling given: discussed foot care and discussed diet Educational resources provided: brochure Self management tools provided:   Other plans: Return to clinic in 2-3 months for HbA1c check.

## 2013-07-23 NOTE — Progress Notes (Signed)
INTERNAL MEDICINE TEACHING ATTENDING ADDENDUM - Nischal Narendra, MD: I reviewed and discussed at the time of visit with the resident Dr. Jones, the patient's medical history, physical examination, diagnosis and results of tests and treatment and I agree with the patient's care as documented.    

## 2013-09-23 ENCOUNTER — Ambulatory Visit (INDEPENDENT_AMBULATORY_CARE_PROVIDER_SITE_OTHER): Payer: Medicaid Other | Admitting: Internal Medicine

## 2013-09-23 ENCOUNTER — Encounter: Payer: Self-pay | Admitting: Internal Medicine

## 2013-09-23 VITALS — BP 126/77 | HR 104 | Temp 97.7°F | Ht 63.0 in | Wt 181.9 lb

## 2013-09-23 DIAGNOSIS — I10 Essential (primary) hypertension: Secondary | ICD-10-CM

## 2013-09-23 DIAGNOSIS — Z0000 Encounter for general adult medical examination without abnormal findings: Secondary | ICD-10-CM

## 2013-09-23 DIAGNOSIS — Z7189 Other specified counseling: Secondary | ICD-10-CM | POA: Insufficient documentation

## 2013-09-23 DIAGNOSIS — E119 Type 2 diabetes mellitus without complications: Secondary | ICD-10-CM

## 2013-09-23 DIAGNOSIS — Z23 Encounter for immunization: Secondary | ICD-10-CM

## 2013-09-23 LAB — GLUCOSE, CAPILLARY: GLUCOSE-CAPILLARY: 125 mg/dL — AB (ref 70–99)

## 2013-09-23 LAB — POCT GLYCOSYLATED HEMOGLOBIN (HGB A1C): Hemoglobin A1C: 6.3

## 2013-09-23 NOTE — Assessment & Plan Note (Addendum)
Received flu shot today. 

## 2013-09-23 NOTE — Assessment & Plan Note (Signed)
BP Readings from Last 3 Encounters:  09/23/13 126/77  07/22/13 130/83  07/07/13 140/80    Lab Results  Component Value Date   NA 139 07/07/2013   K 4.0 07/07/2013   CREATININE 0.43* 07/07/2013    Assessment: Blood pressure control: controlled Progress toward BP goal:  at goal Comments: 126/77 today. Reports no symptoms associated w/ taking Lisinopril.   Plan: Medications:  continue current medications; Lisinopril 5 mg po qd Educational resources provided: brochure Self management tools provided:   Other plans: RTC in 3 months.

## 2013-09-23 NOTE — Patient Instructions (Signed)
General Instructions:  1. Please schedule a follow up appointment for 3 months.  2. Please take all medications as prescribed.   3. If you have worsening of your symptoms or new symptoms arise, please call the clinic ([phone]), or go to the ER immediately if symptoms are severe.  You have done a great job in taking all your medications. I appreciate it very much. Please continue doing that.   Thank you for bringing your medicines today. This helps [place] keep you safe from mistakes.   Progress Toward Treatment Goals:  Treatment Goal 09/23/2013  Hemoglobin A1C at goal  Blood pressure at goal    Self Care Goals & Plans:  Self Care Goal 09/23/2013  Manage my medications take my medicines as prescribed; bring my medications to every visit; refill my medications on time  Monitor my health keep track of my blood glucose; bring my glucose meter and log to each visit  Eat healthy foods drink diet soda or water instead of juice or soda; eat more vegetables; eat foods that are low in salt; eat baked foods instead of fried foods; eat fruit for snacks and desserts  Be physically active -  Meeting treatment goals maintain the current self-care plan    Home Blood Glucose Monitoring 09/23/2013  Check my blood sugar once a day  When to check my blood sugar before breakfast     Care Management & Community Referrals:  Referral 09/23/2013  Referrals made for care management support none needed  Referrals made to community resources none

## 2013-09-23 NOTE — Assessment & Plan Note (Signed)
Lab Results  Component Value Date   HGBA1C 6.3 09/23/2013   HGBA1C 6.1 06/10/2013   HGBA1C 5.6 10/04/2012     Assessment: Diabetes control: good control (HgbA1C at goal) Progress toward A1C goal:  at goal Comments: HbA1c 6.3 today, slightly increased from 6.1 at last visit. Will reassess at next visit, consider adding a different medication if continues to rise. Discussed diet and exercise at length.   Plan: Medications:  continue current medications Home glucose monitoring: Frequency: once a day Timing: before breakfast Instruction/counseling given: reminded to bring blood glucose meter & log to each visit and discussed the need for weight loss Educational resources provided: brochure Self management tools provided: copy of home glucose meter download Other plans: RTC in 3 months. Consider changing therapy if HbA1c increases significantly at next visit.

## 2013-09-23 NOTE — Progress Notes (Signed)
Subjective:   Patient ID: Jaime [name] female   DOB: 02/02/1965 48 y.o.   MRN: [id number]  HPI: [name] is a 48 y.o. female w/ PMHx of DM type II, HTN, Sickle cell trait, and developmental delay, presents to the clinic for follow up. [name] reports no issues today. She has been very compliant w/ her medications and says she has been watching what she eats and attempting to exercise more regularly. She denies any shoulder pain, diarrhea, chest pain, SOB, nausea, or vomiting.    Past Medical History  Diagnosis Date  . DUB (dysfunctional uterine bleeding)     total abdominal hysterectomy in 2007, ovaries intact  . Furuncle of multiple sites     multiple I and D's  . Diabetes mellitus   . GERD (gastroesophageal reflux disease)   . Hydradenitis    Current Outpatient Prescriptions  Medication Sig Dispense Refill  . ACCU-CHEK FASTCLIX LANCETS MISC 1 Device by Does not apply route 1 day or 1 dose. Dx: 250.00  1 each  11  . Blood Glucose Monitoring Suppl (ACCU-CHEK NANO SMARTVIEW) W/DEVICE KIT 1 Device by Does not apply route 1 day or 1 dose. Dx; 250.00  1 kit  1  . glucose blood test strip Use as instructed  100 each  12  . lisinopril (PRINIVIL,ZESTRIL) 5 MG tablet Take 1 tablet (5 mg total) by mouth daily.  30 tablet  5  . metFORMIN (GLUCOPHAGE) 1000 MG tablet Take 1 tablet (1,000 mg total) by mouth 2 (two) times daily with a meal.      . pravastatin (PRAVACHOL) 20 MG tablet Take 1 tablet (20 mg total) by mouth every evening.  30 tablet  11   No current facility-administered medications for this visit.    Review of Systems: General: Denies fever, chills, diaphoresis, appetite change and fatigue.  Respiratory: Denies SOB, DOE, cough, and wheezing.   Cardiovascular: Denies chest pain and palpitations.  Gastrointestinal: Denies nausea, vomiting, abdominal pain, and diarrhea.  Genitourinary: Denies dysuria, increased frequency, and flank pain. Endocrine: Denies hot or  cold intolerance, polyuria, and polydipsia. Musculoskeletal: Denies myalgias, back pain, joint swelling, arthralgias and gait problem.  Skin: Denies pallor, rash and wounds.  Neurological: Denies dizziness, seizures, syncope, weakness, lightheadedness, numbness and headaches.  Psychiatric/Behavioral: Denies mood changes, and sleep disturbances.  Objective:   Physical Exam: Filed Vitals:   09/23/13 1413  BP: 126/77  Pulse: 104  Temp: 97.7 F (36.5 C)  TempSrc: Oral  Height: 5' 3" (1.6 m)  Weight: 181 lb 14.4 oz (82.509 kg)  SpO2: 99%   Physical Exam: General: Alert, cooperative, NAD. HEENT: PERRL, EOMI. Moist mucus membranes Neck: Full range of motion without pain, supple, no lymphadenopathy or carotid bruits Lungs: Clear to ascultation bilaterally, normal work of respiration, no wheezes, rales, rhonchi Heart: RRR. 2/6 systolic murmur heard loudest over the right sternal border.  Abdomen: Soft, non-tender, non-distended, BS + Extremities: No cyanosis, clubbing, or edema Neurologic: Alert & oriented X3, cranial nerves II-XII intact, strength grossly intact, sensation intact to light touch  Assessment & Plan:   Please see problem based assessment and plan.

## 2013-09-25 NOTE — Progress Notes (Signed)
Case discussed with Dr. Jones at the time of the visit.  We reviewed the resident's history and exam and pertinent patient test results.  I agree with the assessment, diagnosis and plan of care documented in the resident's note. 

## 2013-10-03 ENCOUNTER — Other Ambulatory Visit: Payer: Medicaid Other

## 2013-10-03 ENCOUNTER — Ambulatory Visit: Payer: Medicaid Other | Admitting: Oncology

## 2013-10-22 ENCOUNTER — Other Ambulatory Visit: Payer: Self-pay | Admitting: *Deleted

## 2013-10-22 DIAGNOSIS — D500 Iron deficiency anemia secondary to blood loss (chronic): Secondary | ICD-10-CM

## 2013-10-22 DIAGNOSIS — D573 Sickle-cell trait: Secondary | ICD-10-CM

## 2013-10-23 ENCOUNTER — Other Ambulatory Visit (HOSPITAL_BASED_OUTPATIENT_CLINIC_OR_DEPARTMENT_OTHER): Payer: Medicaid Other

## 2013-10-23 ENCOUNTER — Ambulatory Visit (HOSPITAL_BASED_OUTPATIENT_CLINIC_OR_DEPARTMENT_OTHER): Payer: Medicaid Other | Admitting: Oncology

## 2013-10-23 ENCOUNTER — Telehealth: Payer: Self-pay | Admitting: Oncology

## 2013-10-23 VITALS — BP 151/86 | HR 87 | Temp 98.9°F | Resp 20 | Ht 63.0 in | Wt 184.1 lb

## 2013-10-23 DIAGNOSIS — D500 Iron deficiency anemia secondary to blood loss (chronic): Secondary | ICD-10-CM

## 2013-10-23 DIAGNOSIS — D573 Sickle-cell trait: Secondary | ICD-10-CM

## 2013-10-23 DIAGNOSIS — D509 Iron deficiency anemia, unspecified: Secondary | ICD-10-CM

## 2013-10-23 LAB — CBC WITH DIFFERENTIAL/PLATELET
BASO%: 0.5 % (ref 0.0–2.0)
Basophils Absolute: 0 10*3/uL (ref 0.0–0.1)
EOS%: 1.7 % (ref 0.0–7.0)
Eosinophils Absolute: 0.1 10*3/uL (ref 0.0–0.5)
HEMATOCRIT: 34.3 % — AB (ref 34.8–46.6)
HGB: 11.3 g/dL — ABNORMAL LOW (ref 11.6–15.9)
LYMPH#: 1.2 10*3/uL (ref 0.9–3.3)
LYMPH%: 27.3 % (ref 14.0–49.7)
MCH: 28.1 pg (ref 25.1–34.0)
MCHC: 32.8 g/dL (ref 31.5–36.0)
MCV: 85.6 fL (ref 79.5–101.0)
MONO#: 0.2 10*3/uL (ref 0.1–0.9)
MONO%: 5.6 % (ref 0.0–14.0)
NEUT#: 2.9 10*3/uL (ref 1.5–6.5)
NEUT%: 64.9 % (ref 38.4–76.8)
Platelets: 255 10*3/uL (ref 145–400)
RBC: 4 10*6/uL (ref 3.70–5.45)
RDW: 14.7 % — ABNORMAL HIGH (ref 11.2–14.5)
WBC: 4.5 10*3/uL (ref 3.9–10.3)

## 2013-10-23 LAB — CHCC SMEAR

## 2013-10-23 NOTE — Progress Notes (Addendum)
  [place] OFFICE PROGRESS NOTE   Diagnosis: Sickle cell trait, history of iron deficiency  INTERVAL HISTORY:   She returns as scheduled. She feels well. No complaint.  Objective:  Vital signs in last 24 hours:  Blood pressure 151/86, pulse 87, temperature 98.9 F (37.2 C), temperature source Oral, resp. rate 20, height 5\' 3"  (1.6 m), weight 184 lb 1.6 oz (83.507 kg), last menstrual period 10/31/2005.    HEENT: Neck without mass Lymphatics: No cervical, supra-clavicular, or axillary nodes Resp: Lungs clear bilaterally Cardio: Regular rate and rhythm GI: No hepatosplenomegaly Vascular: No leg edema   Lab Results:  Lab Results  Component Value Date   WBC 4.5 10/23/2013   HGB 11.3* 10/23/2013   HCT 34.3* 10/23/2013   MCV 85.6 10/23/2013   PLT 255 10/23/2013   NEUTROABS 2.9 10/23/2013   the red cells appear microcytic and mildly hypochromic. A few ovalocytes and teardrops. The platelets appear normal in number.    Medications: I have reviewed the patient's current medications.  Assessment/Plan: 1. History of iron deficiency anemia. The hemoglobin is mildly decreased today. The MCV is also in the normal range. 2. Sickle cell trait. 3. Status post hysterectomy. 4. History of dysphagia. She underwent an endoscopy by Dr. Collene Mares.  5. diabetes   Disposition:  The hemoglobin is slightly lower today. The mild anemia may be related to sickle cell trait. She will return for a CBC and ferritin level in 3 months to be sure she is not developing recurrent iron deficiency. She would like to continue followup in the Hematology clinic. Ms. Hathaway will return for an office visit in one year.  Betsy Coder, MD  10/23/2013  8:55 AM

## 2013-10-23 NOTE — Telephone Encounter (Signed)
gv adn printed appt sched and avs for pt for Dec adn Sept 2016

## 2013-11-12 ENCOUNTER — Other Ambulatory Visit: Payer: Self-pay | Admitting: Internal Medicine

## 2013-11-28 ENCOUNTER — Other Ambulatory Visit: Payer: Self-pay

## 2013-11-28 DIAGNOSIS — Z803 Family history of malignant neoplasm of breast: Secondary | ICD-10-CM

## 2013-11-28 DIAGNOSIS — Z1231 Encounter for screening mammogram for malignant neoplasm of breast: Secondary | ICD-10-CM

## 2013-12-14 ENCOUNTER — Other Ambulatory Visit: Payer: Self-pay | Admitting: Internal Medicine

## 2013-12-16 ENCOUNTER — Ambulatory Visit
Admission: RE | Admit: 2013-12-16 | Discharge: 2013-12-16 | Disposition: A | Discharge status: Home / Self Care | Payer: Medicaid Other | Source: Ambulatory Visit

## 2013-12-16 DIAGNOSIS — Z1231 Encounter for screening mammogram for malignant neoplasm of breast: Secondary | ICD-10-CM

## 2013-12-16 DIAGNOSIS — Z803 Family history of malignant neoplasm of breast: Secondary | ICD-10-CM

## 2013-12-18 ENCOUNTER — Other Ambulatory Visit: Payer: Self-pay | Admitting: Oncology

## 2013-12-18 DIAGNOSIS — R928 Other abnormal and inconclusive findings on diagnostic imaging of breast: Secondary | ICD-10-CM

## 2013-12-21 ENCOUNTER — Encounter (HOSPITAL_COMMUNITY): Payer: Self-pay | Admitting: Family Medicine

## 2013-12-21 ENCOUNTER — Emergency Department (HOSPITAL_COMMUNITY)
Admission: EM | Admit: 2013-12-21 | Discharge: 2013-12-21 | Disposition: A | Discharge status: Home / Self Care | Payer: Medicaid Other | Attending: Emergency Medicine | Admitting: Emergency Medicine

## 2013-12-21 DIAGNOSIS — Z872 Personal history of diseases of the skin and subcutaneous tissue: Secondary | ICD-10-CM | POA: Insufficient documentation

## 2013-12-21 DIAGNOSIS — M545 Low back pain, unspecified: Secondary | ICD-10-CM

## 2013-12-21 DIAGNOSIS — Y92410 Unspecified street and highway as the place of occurrence of the external cause: Secondary | ICD-10-CM | POA: Diagnosis not present

## 2013-12-21 DIAGNOSIS — E119 Type 2 diabetes mellitus without complications: Secondary | ICD-10-CM | POA: Insufficient documentation

## 2013-12-21 DIAGNOSIS — Z79899 Other long term (current) drug therapy: Secondary | ICD-10-CM | POA: Insufficient documentation

## 2013-12-21 DIAGNOSIS — Z8719 Personal history of other diseases of the digestive system: Secondary | ICD-10-CM | POA: Diagnosis not present

## 2013-12-21 DIAGNOSIS — Y998 Other external cause status: Secondary | ICD-10-CM | POA: Insufficient documentation

## 2013-12-21 DIAGNOSIS — Y9389 Activity, other specified: Secondary | ICD-10-CM | POA: Insufficient documentation

## 2013-12-21 DIAGNOSIS — S3992XA Unspecified injury of lower back, initial encounter: Secondary | ICD-10-CM | POA: Insufficient documentation

## 2013-12-21 DIAGNOSIS — Z8742 Personal history of other diseases of the female genital tract: Secondary | ICD-10-CM | POA: Insufficient documentation

## 2013-12-21 MED ORDER — KETOROLAC TROMETHAMINE 60 MG/2ML IM SOLN
60.0000 mg | Freq: Once | INTRAMUSCULAR | Status: AC
  Administered 2013-12-21: 60 mg via INTRAMUSCULAR
  Filled 2013-12-21: qty 2

## 2013-12-21 MED ORDER — IBUPROFEN 600 MG PO TABS: 600.0000 mg | ORAL_TABLET | Freq: Three times a day (TID) | ORAL | Status: DC

## 2013-12-21 NOTE — ED Notes (Signed)
Pt  Also reports her back did hurt but not at this time

## 2013-12-21 NOTE — ED Notes (Signed)
Per pt sts rear passenger in [place]. sts lower back pain.

## 2013-12-21 NOTE — Discharge Instructions (Signed)
Return to the emergency room with worsening of symptoms, new symptoms or with symptoms that are concerning , especially fevers, loss of control of bladder or bowels, numbness or tingling around genital region or anus, weakness. RICE: Rest, Ice (three cycles of 20 mins on, 11mins off at least twice a day), compression/brace, elevation. Heating pad works well for back pain. Ibuprofen 400mg  (2 tablets 200mg ) every 5-6 hours for 3-5 days and then as needed for pain. Follow up with PCP if symptoms worsen or are persistent.   Back Exercises Back exercises help treat and prevent back injuries. The goal of back exercises is to increase the strength of your abdominal and back muscles and the flexibility of your back. These exercises should be started when you no longer have back pain. Back exercises include:  Pelvic Tilt. Lie on your back with your knees bent. Tilt your pelvis until the lower part of your back is against the floor. Hold this position 5 to 10 sec and repeat 5 to 10 times.  Knee to Chest. Pull first 1 knee up against your chest and hold for 20 to 30 seconds, repeat this with the other knee, and then both knees. This may be done with the other leg straight or bent, whichever feels better.  Sit-Ups or Curl-Ups. Bend your knees 90 degrees. Start with tilting your pelvis, and do a partial, slow sit-up, lifting your trunk only 30 to 45 degrees off the floor. Take at least 2 to 3 seconds for each sit-up. Do not do sit-ups with your knees out straight. If partial sit-ups are difficult, simply do the above but with only tightening your abdominal muscles and holding it as directed.  Hip-Lift. Lie on your back with your knees flexed 90 degrees. Push down with your feet and shoulders as you raise your hips a couple inches off the floor; hold for 10 seconds, repeat 5 to 10 times.  Back arches. Lie on your stomach, propping yourself up on bent elbows. Slowly press on your hands, causing an arch in your low  back. Repeat 3 to 5 times. Any initial stiffness and discomfort should lessen with repetition over time.  Shoulder-Lifts. Lie face down with arms beside your body. Keep hips and torso pressed to floor as you slowly lift your head and shoulders off the floor. Do not overdo your exercises, especially in the beginning. Exercises may cause you some mild back discomfort which lasts for a few minutes; however, if the pain is more severe, or lasts for more than 15 minutes, do not continue exercises until you see your caregiver. Improvement with exercise therapy for back problems is slow.  See your caregivers for assistance with developing a proper back exercise program. Document Released: 02/24/2004 Document Revised: 04/10/2011 Document Reviewed: 11/17/2010 [facility] Patient Information 2015 [place], [place]. This information is not intended to replace advice given to you by your health care provider. Make sure you discuss any questions you have with your health care provider.
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
## 2014-01-05 ENCOUNTER — Ambulatory Visit
Admission: RE | Admit: 2014-01-05 | Discharge: 2014-01-05 | Disposition: A | Discharge status: Home / Self Care | Payer: Medicaid Other | Source: Ambulatory Visit | Attending: Oncology | Admitting: Oncology

## 2014-01-05 ENCOUNTER — Other Ambulatory Visit: Payer: Self-pay | Admitting: Internal Medicine

## 2014-01-05 DIAGNOSIS — R928 Other abnormal and inconclusive findings on diagnostic imaging of breast: Secondary | ICD-10-CM

## 2014-01-13 ENCOUNTER — Encounter: Payer: Self-pay | Admitting: Internal Medicine

## 2014-01-13 ENCOUNTER — Ambulatory Visit (INDEPENDENT_AMBULATORY_CARE_PROVIDER_SITE_OTHER): Payer: Medicaid Other | Admitting: Internal Medicine

## 2014-01-13 VITALS — BP 133/85 | HR 92 | Temp 98.0°F | Ht 62.0 in | Wt 180.6 lb

## 2014-01-13 DIAGNOSIS — I10 Essential (primary) hypertension: Secondary | ICD-10-CM

## 2014-01-13 DIAGNOSIS — Z0000 Encounter for general adult medical examination without abnormal findings: Secondary | ICD-10-CM

## 2014-01-13 DIAGNOSIS — B351 Tinea unguium: Secondary | ICD-10-CM | POA: Insufficient documentation

## 2014-01-13 DIAGNOSIS — E119 Type 2 diabetes mellitus without complications: Secondary | ICD-10-CM

## 2014-01-13 LAB — LIPID PANEL
CHOLESTEROL: 113 mg/dL (ref 0–200)
HDL: 40 mg/dL (ref >39)
LDL Cholesterol: 61 mg/dL (ref 0–99)
Total CHOL/HDL Ratio: 2.8 Ratio
Triglycerides: 58 mg/dL (ref <150)
VLDL: 12 mg/dL (ref 0–40)

## 2014-01-13 LAB — GLUCOSE, CAPILLARY: Glucose-Capillary: 121 mg/dL — ABNORMAL HIGH (ref 70–99)

## 2014-01-13 LAB — POCT GLYCOSYLATED HEMOGLOBIN (HGB A1C): Hemoglobin A1C: 6.2

## 2014-01-13 MED ORDER — TERBINAFINE HCL 250 MG PO TABS: 250.0000 mg | ORAL_TABLET | Freq: Every day | ORAL | Status: DC

## 2014-01-13 NOTE — Assessment & Plan Note (Signed)
Patient w/ thick, darkened left great toenail. Other nails on the left foot also appear somewhat off colored. Patient states she has had this issue in the past and had to take an oral medication. No obvious discharge, erythema, or tenderness to suggest and associated cellulitis or significant infection.  -Start Terbinafine 250 mg daily for 12 weeks.  -Referral to podiatry for foot care.

## 2014-01-13 NOTE — Assessment & Plan Note (Signed)
BP Readings from Last 3 Encounters:  01/13/14 133/85  12/21/13 152/95  10/23/13 151/86    Lab Results  Component Value Date   NA 139 07/07/2013   K 4.0 07/07/2013   CREATININE 0.43* 07/07/2013    Assessment: Blood pressure control: mildly elevated Progress toward BP goal:  unchanged Comments: BP slightly elevated initially, patient says she was in a hurry coming to her appointment. On recheck of her BP, 133/85  Plan: Medications:  continue current medications; Lisinopril 5 mg daily. May need to increase to 10 mg daily in the future.  Educational resources provided: brochure (has information) Self management tools provided:   Other plans: RTC in 3 months. Increase lisinopril as above if necessary at next clinic visit.

## 2014-01-13 NOTE — Assessment & Plan Note (Signed)
Check lipid panel today 

## 2014-01-13 NOTE — Patient Instructions (Addendum)
General Instructions:  1. Please schedule a follow up appointment for 3 months.   2. Please take all medications as prescribed.   Continue Lisinopril to 5 mg daily for now. We may need to increase it at your next clinic visit.   Start taking Terbinafine 250 mg daily (for 12 weeks).   Please see the foot doctor as well.   3. If you have worsening of your symptoms or new symptoms arise, please call the clinic ([phone]), or go to the ER immediately if symptoms are severe.      Please bring your medicines with you each time you come to clinic.  Medicines may include prescription medications, over-the-counter medications, herbal remedies, eye drops, vitamins, or other pills.   Progress Toward Treatment Goals:  Treatment Goal 01/13/2014  Hemoglobin A1C at goal  Blood pressure unchanged    Self Care Goals & Plans:  Self Care Goal 01/13/2014  Manage my medications take my medicines as prescribed; bring my medications to every visit; refill my medications on time  Monitor my health -  Eat healthy foods drink diet soda or water instead of juice or soda; eat more vegetables; eat foods that are low in salt; eat baked foods instead of fried foods; eat fruit for snacks and desserts  Be physically active -  Meeting treatment goals maintain the current self-care plan    Home Blood Glucose Monitoring 01/13/2014  Check my blood sugar once a day  When to check my blood sugar before breakfast     Care Management & Community Referrals:  Referral 01/13/2014  Referrals made for care management support none needed  Referrals made to community resources none
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
## 2014-01-13 NOTE — Assessment & Plan Note (Signed)
Lab Results  Component Value Date   HGBA1C 6.2 01/13/2014   HGBA1C 6.3 09/23/2013   HGBA1C 6.1 06/10/2013     Assessment: Diabetes control: fair control Progress toward A1C goal:  at goal Comments: HbA1c generally unchanged from previous. Taking Metformin 1000 mg bid. States her CBG's are 120's in the AM when she checks. Weight generally unchanged from previous visit.   Plan: Medications:  continue current medications Home glucose monitoring: Frequency: once a day Timing: before breakfast Instruction/counseling given: discussed the need for weight loss Educational resources provided: brochure (has information) Self management tools provided:   Other plans: RTC in 3 months for HbA1c check. May be able to go to every 6 months if well controlled at next visit.

## 2014-01-15 NOTE — Progress Notes (Signed)
Internal Medicine Clinic Attending  Case discussed with Dr. Jones soon after the resident saw the patient.  We reviewed the resident's history and exam and pertinent patient test results.  I agree with the assessment, diagnosis, and plan of care documented in the resident's note. 

## 2014-01-27 ENCOUNTER — Other Ambulatory Visit (HOSPITAL_BASED_OUTPATIENT_CLINIC_OR_DEPARTMENT_OTHER): Payer: Medicaid Other

## 2014-01-27 DIAGNOSIS — D500 Iron deficiency anemia secondary to blood loss (chronic): Secondary | ICD-10-CM

## 2014-01-27 DIAGNOSIS — D509 Iron deficiency anemia, unspecified: Secondary | ICD-10-CM

## 2014-01-27 DIAGNOSIS — D573 Sickle-cell trait: Secondary | ICD-10-CM

## 2014-01-27 LAB — CBC WITH DIFFERENTIAL/PLATELET
BASO%: 0.3 % (ref 0.0–2.0)
BASOS ABS: 0 10*3/uL (ref 0.0–0.1)
EOS%: 0.4 % (ref 0.0–7.0)
Eosinophils Absolute: 0 10*3/uL (ref 0.0–0.5)
HCT: 38.9 % (ref 34.8–46.6)
HEMOGLOBIN: 12.5 g/dL (ref 11.6–15.9)
LYMPH#: 1.2 10*3/uL (ref 0.9–3.3)
LYMPH%: 19.8 % (ref 14.0–49.7)
MCH: 27.6 pg (ref 25.1–34.0)
MCHC: 32.2 g/dL (ref 31.5–36.0)
MCV: 85.8 fL (ref 79.5–101.0)
MONO#: 0.3 10*3/uL (ref 0.1–0.9)
MONO%: 4.7 % (ref 0.0–14.0)
NEUT#: 4.4 10*3/uL (ref 1.5–6.5)
NEUT%: 74.8 % (ref 38.4–76.8)
PLATELETS: 282 10*3/uL (ref 145–400)
RBC: 4.54 10*6/uL (ref 3.70–5.45)
RDW: 14.2 % (ref 11.2–14.5)
WBC: 5.8 10*3/uL (ref 3.9–10.3)

## 2014-01-27 LAB — FERRITIN CHCC: Ferritin: 114 ng/ml (ref 9–269)

## 2014-02-02 ENCOUNTER — Telehealth: Payer: Self-pay | Admitting: *Deleted

## 2014-02-02 NOTE — Telephone Encounter (Signed)
Made patient and her mother aware of normal labs. Provided a copy. F/u as scheduled.

## 2014-02-02 NOTE — Telephone Encounter (Signed)
-----   Message from Ladell Pier, MD sent at 01/27/2014  6:39 PM EST ----- Please call patient. Hb and ferritin are normal, f/u as scheduled

## 2014-02-07 ENCOUNTER — Other Ambulatory Visit: Payer: Self-pay | Admitting: Internal Medicine

## 2014-02-24 ENCOUNTER — Ambulatory Visit (INDEPENDENT_AMBULATORY_CARE_PROVIDER_SITE_OTHER): Payer: Medicaid Other

## 2014-02-24 VITALS — BP 138/86 | HR 111 | Resp 12 | Ht 62.0 in | Wt 175.0 lb

## 2014-02-24 DIAGNOSIS — E1140 Type 2 diabetes mellitus with diabetic neuropathy, unspecified: Secondary | ICD-10-CM

## 2014-02-24 DIAGNOSIS — M79676 Pain in unspecified toe(s): Secondary | ICD-10-CM

## 2014-02-24 DIAGNOSIS — B351 Tinea unguium: Secondary | ICD-10-CM

## 2014-02-24 NOTE — Patient Instructions (Signed)
Diabetes and Foot Care Diabetes may cause you to have problems because of poor blood supply (circulation) to your feet and legs. This may cause the skin on your feet to become thinner, break easier, and heal more slowly. Your skin may become dry, and the skin may peel and crack. You may also have nerve damage in your legs and feet causing decreased feeling in them. You may not notice minor injuries to your feet that could lead to infections or more serious problems. Taking care of your feet is one of the most important things you can do for yourself.  HOME CARE INSTRUCTIONS  Wear shoes at all times, even in the house. Do not go barefoot. Bare feet are easily injured.  Check your feet daily for blisters, cuts, and redness. If you cannot see the bottom of your feet, use a mirror or ask someone for help.  Wash your feet with warm water (do not use hot water) and mild soap. Then pat your feet and the areas between your toes until they are completely dry. Do not soak your feet as this can dry your skin.  Apply a moisturizing lotion or petroleum jelly (that does not contain alcohol and is unscented) to the skin on your feet and to dry, brittle toenails. Do not apply lotion between your toes.  Trim your toenails straight across. Do not dig under them or around the cuticle. File the edges of your nails with an emery board or nail file.  Do not cut corns or calluses or try to remove them with medicine.  Wear clean socks or stockings every day. Make sure they are not too tight. Do not wear knee-high stockings since they may decrease blood flow to your legs.  Wear shoes that fit properly and have enough cushioning. To break in new shoes, wear them for just a few hours a day. This prevents you from injuring your feet. Always look in your shoes before you put them on to be sure there are no objects inside.  Do not cross your legs. This may decrease the blood flow to your feet.  If you find a minor scrape,  cut, or break in the skin on your feet, keep it and the skin around it clean and dry. These areas may be cleansed with mild soap and water. Do not cleanse the area with peroxide, alcohol, or iodine.  When you remove an adhesive bandage, be sure not to damage the skin around it.  If you have a wound, look at it several times a day to make sure it is healing.  Do not use heating pads or hot water bottles. They may burn your skin. If you have lost feeling in your feet or legs, you may not know it is happening until it is too late.  Make sure your health care provider performs a complete foot exam at least annually or more often if you have foot problems. Report any cuts, sores, or bruises to your health care provider immediately. SEEK MEDICAL CARE IF:   You have an injury that is not healing.  You have cuts or breaks in the skin.  You have an ingrown nail.  You notice redness on your legs or feet.  You feel burning or tingling in your legs or feet.  You have pain or cramps in your legs and feet.  Your legs or feet are numb.  Your feet always feel cold. SEEK IMMEDIATE MEDICAL CARE IF:   There is increasing redness,   swelling, or pain in or around a wound.  There is a red line that goes up your leg.  Pus is coming from a wound.  You develop a fever or as directed by your health care provider.  You notice a bad smell coming from an ulcer or wound. Document Released: 01/14/2000 Document Revised: 09/18/2012 Document Reviewed: 06/25/2012 [facility] Patient Information 2015 [place], [place]. This information is not intended to replace advice given to you by your health care provider. Make sure you discuss any questions you have with your health care provider.   Obtain Fungi Nail  At any drug store, without prescription.Marland Kitchen Apply to each nail once daily for 12 months

## 2014-02-24 NOTE — Progress Notes (Signed)
   Subjective:    Patient ID: Jaime [name], female    DOB: 10/28/1965, 49 y.o.   MRN: [id number]  HPI Comments: Pt complains of a discolored, thickened left 1st toenail, and pt is diabetic.  Pt states she has been on Lamisil for less than 30 days prescribed by Dr. Luanne Bras.  Pt request debridement of 10 toenails.     Review of Systems  All other systems reviewed and are negative.      Objective:   Physical Exam 49 year old [place] female well-developed well-nourished oriented 3 presents this time for diabetic foot and nail care patient does have fungus nails has been on Lamisil for 1 month. Nails show thickening brittleness discoloration friability and darkening consistent with onychomycosis. No open wounds or ulcers. Lower extremity objective findings as follows vascular status is intact with pulses palpable DP +2 PT plus one over 4 bilateral Refill time 3 seconds all digits. Epicritic and proprioceptive sensations appear to be intact and symmetric bilateral some decreased sensation Semmes Weinstein to the toes bilateral. Orthopedic biomechanical exam reveals mild digital contractures no open wounds no ulcers no secondary infections. No x-rays taken at this time.       Assessment & Plan:  Assessment this time is onychomycosis painful mycotic nails are debrided 10 the presence of diabetes and complications patient advised to complete her Lamisil regimen recommended Fungi-Nail over-the-counter as an adjunct topical therapies. Mycotic nails debrided 10 return for future palliative care every 3 months as recommended next  Harriet Masson DPM

## 2014-04-13 ENCOUNTER — Telehealth: Payer: Self-pay | Admitting: Internal Medicine

## 2014-04-13 NOTE — Telephone Encounter (Signed)
Call to patient to confirm appointment for 04/14/14 at 2:45. Lm with someone on home number. Cell is disconncted

## 2014-04-14 ENCOUNTER — Ambulatory Visit (INDEPENDENT_AMBULATORY_CARE_PROVIDER_SITE_OTHER): Payer: Medicaid Other | Admitting: Internal Medicine

## 2014-04-14 ENCOUNTER — Encounter: Payer: Self-pay | Admitting: Internal Medicine

## 2014-04-14 VITALS — BP 136/73 | HR 80 | Temp 98.2°F | Ht 62.0 in | Wt 180.7 lb

## 2014-04-14 DIAGNOSIS — E119 Type 2 diabetes mellitus without complications: Secondary | ICD-10-CM

## 2014-04-14 DIAGNOSIS — I10 Essential (primary) hypertension: Secondary | ICD-10-CM

## 2014-04-14 DIAGNOSIS — B351 Tinea unguium: Secondary | ICD-10-CM

## 2014-04-14 LAB — POCT GLYCOSYLATED HEMOGLOBIN (HGB A1C): HEMOGLOBIN A1C: 6.4

## 2014-04-14 LAB — GLUCOSE, CAPILLARY: GLUCOSE-CAPILLARY: 91 mg/dL (ref 70–99)

## 2014-04-14 NOTE — Progress Notes (Signed)
Subjective:   Patient ID: Jaime [name] female   DOB: 1965-07-06 49 y.o.   MRN: [id number]  HPI: [name] is a 49 y.o. female w/ PMHx of DM type II, HTN, Sickle cell trait, and developmental delay, presents to the clinic for follow up for her DM type II and HTN. Patient was last seen in clinic on 01/14/15 at which time she was complaining of thickened and discolored toenails. She was started on Terbinafine 250 mg daily at that time and referred to podiatry for further management. Today, she claims her toenails are improved especially since seeing podiatrist. She has not been consistent w/ taking her terbinafine daily and still has ~45 pills left according to the patient (has only taken about 1/2 the course). As far as her DM and HTN is concerned, no issues currently. BP controlled today, HbA1c 6.4. We discussed diet and exercise at length.    Past Medical History  Diagnosis Date  . DUB (dysfunctional uterine bleeding)     total abdominal hysterectomy in 2007, ovaries intact  . Furuncle of multiple sites     multiple I and D's  . Diabetes mellitus   . GERD (gastroesophageal reflux disease)   . Hydradenitis    Current Outpatient Prescriptions  Medication Sig Dispense Refill  . ACCU-CHEK FASTCLIX LANCETS MISC 1 Device by Does not apply route 1 day or 1 dose. Dx: 250.00 1 each 11  . ACCU-CHEK SMARTVIEW test strip USE AS DIRECTED 100 each 11  . Blood Glucose Monitoring Suppl (ACCU-CHEK NANO SMARTVIEW) W/DEVICE KIT 1 Device by Does not apply route 1 day or 1 dose. Dx; 250.00 1 kit 1  . ibuprofen (ADVIL,MOTRIN) 600 MG tablet Take 1 tablet (600 mg total) by mouth 3 (three) times daily. With food for 3 to 5 days. 30 tablet 0  . lisinopril (PRINIVIL,ZESTRIL) 5 MG tablet TAKE 1 TABLET BY MOUTH EVERY DAY 30 tablet 5  . meloxicam (MOBIC) 7.5 MG tablet TAKE 1 TABLET BY MOUTH EVERY DAY AS NEEDED FOR PAIN 30 tablet 0  . metFORMIN (GLUCOPHAGE) 1000 MG tablet Take 1 tablet (1,000 mg total) by  mouth 2 (two) times daily with a meal.    . pravastatin (PRAVACHOL) 20 MG tablet TAKE 1 TABLET BY MOUTH EVERY EVENING 30 tablet 5  . terbinafine (LAMISIL) 250 MG tablet Take 1 tablet (250 mg total) by mouth daily. 90 tablet 0   No current facility-administered medications for this visit.   Review of Systems  General: Denies fever, diaphoresis, appetite change, and fatigue.  Respiratory: Denies SOB, cough, and wheezing.   Cardiovascular: Denies chest pain and palpitations.  Gastrointestinal: Denies nausea, vomiting, abdominal pain, and diarrhea Musculoskeletal: Denies myalgias, arthralgias, back pain, and gait problem.  Neurological: Denies dizziness, syncope, weakness, lightheadedness, and headaches.  Psychiatric/Behavioral: Denies mood changes, sleep disturbance, and agitation.   Objective:   Physical Exam: Filed Vitals:   04/14/14 1459  BP: 136/73  Pulse: 80  Temp: 98.2 F (36.8 C)  TempSrc: Oral  Height: 5' 2"  (1.575 m)  Weight: 180 lb 11.2 oz (81.965 kg)  SpO2: 100%    General: Overweight AA female, alert, cooperative, NAD. HEENT: PERRL, EOMI. Moist mucus membranes Neck: Full range of motion without pain, supple, no lymphadenopathy or carotid bruits Lungs: Clear to ascultation bilaterally, normal work of respiration, no wheezes, rales, rhonchi Heart: RRR. 2/6 systolic murmur heard loudest over the right sternal border.  Abdomen: Soft, non-tender, non-distended, BS + Extremities: No cyanosis, clubbing, or edema.  Thickened, discolored 1st toenail (L>R). Improved.  Neurologic: Alert & oriented x3, cranial nerves II-XII intact, strength grossly intact, sensation intact to light touch  Assessment & Plan:   Please see problem based assessment and plan.

## 2014-04-14 NOTE — Patient Instructions (Signed)
General Instructions:  1. Please schedule a follow up appointment for 3-4 months.   2. Please take all medications as previously prescribed. Please complete full course of Lamisil (terbenafine) for toenails.   3. If you have worsening of your symptoms or new symptoms arise, please call the clinic ([phone]), or go to the ER immediately if symptoms are severe.  You have done a great job in taking all your medications. Please continue to do this.  Thank you for bringing your medicines today. This helps [place] keep you safe from mistakes.   Progress Toward Treatment Goals:  Treatment Goal 04/14/2014  Hemoglobin A1C at goal  Blood pressure at goal    Self Care Goals & Plans:  Self Care Goal 04/14/2014  Manage my medications take my medicines as prescribed; bring my medications to every visit; refill my medications on time  Monitor my health keep track of my blood glucose  Eat healthy foods drink diet soda or water instead of juice or soda; eat more vegetables; eat foods that are low in salt; eat baked foods instead of fried foods; eat fruit for snacks and desserts  Be physically active find an activity I enjoy; take a walk every day  Meeting treatment goals maintain the current self-care plan    Home Blood Glucose Monitoring 04/14/2014  Check my blood sugar once a day  When to check my blood sugar before breakfast     Care Management & Community Referrals:  Referral 04/14/2014  Referrals made for care management support none needed  Referrals made to community resources none

## 2014-04-15 NOTE — Progress Notes (Signed)
Case discussed with Dr. Jones at time of visit.  We reviewed the resident's history and exam and pertinent patient test results.  I agree with the assessment, diagnosis, and plan of care documented in the resident's note. 

## 2014-04-15 NOTE — Assessment & Plan Note (Addendum)
Lab Results  Component Value Date   HGBA1C 6.4 04/14/2014   HGBA1C 6.2 01/13/2014   HGBA1C 6.3 09/23/2013     Assessment: Diabetes control: good control (HgbA1C at goal) Progress toward A1C goal:  at goal Comments: Taking Metformin 1000 mg bid  Plan: Medications:  continue current medications Home glucose monitoring: Frequency: once a day Timing: before breakfast Instruction/counseling given: discussed foot care, discussed the need for weight loss and discussed diet Educational resources provided: brochure (denies) Self management tools provided: copy of home glucose meter download Other plans: Recheck HbA1c in 3-4 months

## 2014-04-15 NOTE — Assessment & Plan Note (Signed)
Somewhat improved. Recently had visit w/ podiatry as well. Has not been very compliant w/ Terbinafine, still has ~45 pills remaining.  -Continue Terbinafine until completed -F/u podiatry

## 2014-04-15 NOTE — Assessment & Plan Note (Signed)
BP Readings from Last 3 Encounters:  04/14/14 136/73  02/24/14 138/86  01/13/14 133/85    Lab Results  Component Value Date   NA 139 07/07/2013   K 4.0 07/07/2013   CREATININE 0.43* 07/07/2013    Assessment: Blood pressure control: controlled Progress toward BP goal:  at goal Comments: Compliant w/ low dose Lisinopril. Low threshold to increase.   Plan: Medications:  continue current medications Educational resources provided: brochure (denies) Self management tools provided:   Other plans: RTC in 3-4 months

## 2014-06-02 ENCOUNTER — Ambulatory Visit (INDEPENDENT_AMBULATORY_CARE_PROVIDER_SITE_OTHER): Payer: Medicaid Other

## 2014-06-02 DIAGNOSIS — M79676 Pain in unspecified toe(s): Secondary | ICD-10-CM

## 2014-06-02 DIAGNOSIS — B351 Tinea unguium: Secondary | ICD-10-CM

## 2014-06-02 DIAGNOSIS — E1140 Type 2 diabetes mellitus with diabetic neuropathy, unspecified: Secondary | ICD-10-CM | POA: Diagnosis not present

## 2014-06-02 NOTE — Progress Notes (Signed)
HPI Presents today chief complaint of painful elongated toenails.  Objective: Pulses are palpable bilateral nails are thick, yellow dystrophic onychomycosis and painful palpation.   Assessment: Onychomycosis with pain in limb.  Plan: Treatment of nails in thickness and length as covered service secondary to pain.  

## 2014-06-09 ENCOUNTER — Other Ambulatory Visit: Payer: Self-pay | Admitting: Internal Medicine

## 2014-06-10 ENCOUNTER — Other Ambulatory Visit: Payer: Self-pay | Admitting: Internal Medicine

## 2014-07-10 ENCOUNTER — Other Ambulatory Visit: Payer: Self-pay | Admitting: Internal Medicine

## 2014-07-31 LAB — HM DIABETES EYE EXAM

## 2014-08-01 LAB — HM DIABETES EYE EXAM

## 2014-08-05 ENCOUNTER — Encounter: Payer: Self-pay | Admitting: *Deleted

## 2014-08-13 ENCOUNTER — Other Ambulatory Visit: Payer: Self-pay | Admitting: Internal Medicine

## 2014-09-03 ENCOUNTER — Ambulatory Visit: Payer: Medicaid Other | Admitting: Podiatry

## 2014-09-09 ENCOUNTER — Encounter: Payer: Self-pay | Admitting: Podiatry

## 2014-09-09 ENCOUNTER — Ambulatory Visit (INDEPENDENT_AMBULATORY_CARE_PROVIDER_SITE_OTHER): Payer: Medicaid Other | Admitting: Podiatry

## 2014-09-09 VITALS — BP 143/89 | HR 89 | Resp 18

## 2014-09-09 DIAGNOSIS — M79676 Pain in unspecified toe(s): Secondary | ICD-10-CM | POA: Diagnosis not present

## 2014-09-09 DIAGNOSIS — E1140 Type 2 diabetes mellitus with diabetic neuropathy, unspecified: Secondary | ICD-10-CM

## 2014-09-09 DIAGNOSIS — E119 Type 2 diabetes mellitus without complications: Secondary | ICD-10-CM

## 2014-09-09 DIAGNOSIS — B351 Tinea unguium: Secondary | ICD-10-CM | POA: Diagnosis not present

## 2014-09-09 NOTE — Progress Notes (Signed)
Patient ID: Jaime [name], female   DOB: Mar 09, 1965, 49 y.o.   MRN: [id number] Complaint:  Visit Type: Patient returns to my office for continued preventative foot care services. Complaint: Patient states" my nails have grown long and thick and become painful to walk and wear shoes" Patient has been diagnosed with DM with no foot complications. The patient presents for preventative foot care services. No changes to ROS  Podiatric Exam: Vascular: dorsalis pedis and posterior tibial pulses are palpable bilateral. Capillary return is immediate. Temperature gradient is WNL. Skin turgor WNL  Sensorium: Normal Semmes Weinstein monofilament test. Normal tactile sensation bilaterally. Nail Exam: Pt has thick disfigured discolored nails with subungual debris noted bilateral entire nail hallux through fifth toenails Ulcer Exam: There is no evidence of ulcer or pre-ulcerative changes or infection. Orthopedic Exam: Muscle tone and strength are WNL. No limitations in general ROM. No crepitus or effusions noted. Foot type and digits show no abnormalities. Bony prominences are unremarkable. Skin: No Porokeratosis. No infection or ulcers  Diagnosis:  Onychomycosis, , Pain in right toe, pain in left toes  Treatment & Plan Procedures and Treatment: Consent by patient was obtained for treatment procedures. The patient understood the discussion of treatment and procedures well. All questions were answered thoroughly reviewed. Debridement of mycotic and hypertrophic toenails, 1 through 5 bilateral and clearing of subungual debris. No ulceration, no infection noted.  Return Visit-Office Procedure: Patient instructed to return to the office for a follow up visit 3 months for continued evaluation and treatment.

## 2014-10-20 ENCOUNTER — Other Ambulatory Visit: Payer: Medicaid Other

## 2014-10-20 ENCOUNTER — Ambulatory Visit: Payer: Medicaid Other | Admitting: Oncology

## 2014-11-02 ENCOUNTER — Other Ambulatory Visit: Payer: Medicaid Other

## 2014-11-02 ENCOUNTER — Ambulatory Visit: Payer: Medicaid Other | Admitting: Oncology

## 2014-11-17 ENCOUNTER — Encounter: Payer: Medicaid Other | Admitting: Internal Medicine

## 2014-11-19 ENCOUNTER — Other Ambulatory Visit: Payer: Self-pay

## 2014-11-19 DIAGNOSIS — Z1231 Encounter for screening mammogram for malignant neoplasm of breast: Secondary | ICD-10-CM

## 2014-12-08 ENCOUNTER — Ambulatory Visit (INDEPENDENT_AMBULATORY_CARE_PROVIDER_SITE_OTHER): Payer: Medicaid Other | Admitting: Internal Medicine

## 2014-12-08 ENCOUNTER — Encounter: Payer: Self-pay | Admitting: Internal Medicine

## 2014-12-08 VITALS — BP 151/84 | HR 90 | Temp 98.4°F | Wt 183.1 lb

## 2014-12-08 DIAGNOSIS — I10 Essential (primary) hypertension: Secondary | ICD-10-CM | POA: Diagnosis not present

## 2014-12-08 DIAGNOSIS — Z7984 Long term (current) use of oral hypoglycemic drugs: Secondary | ICD-10-CM | POA: Diagnosis not present

## 2014-12-08 DIAGNOSIS — Z23 Encounter for immunization: Secondary | ICD-10-CM | POA: Diagnosis not present

## 2014-12-08 DIAGNOSIS — E119 Type 2 diabetes mellitus without complications: Secondary | ICD-10-CM

## 2014-12-08 DIAGNOSIS — Z79899 Other long term (current) drug therapy: Secondary | ICD-10-CM | POA: Diagnosis not present

## 2014-12-08 DIAGNOSIS — Z0000 Encounter for general adult medical examination without abnormal findings: Secondary | ICD-10-CM

## 2014-12-08 LAB — GLUCOSE, CAPILLARY: GLUCOSE-CAPILLARY: 113 mg/dL — AB (ref 65–99)

## 2014-12-08 LAB — POCT GLYCOSYLATED HEMOGLOBIN (HGB A1C): HEMOGLOBIN A1C: 6.4

## 2014-12-08 MED ORDER — LISINOPRIL 10 MG PO TABS: 10.0000 mg | ORAL_TABLET | Freq: Every day | ORAL | Status: DC

## 2014-12-08 MED ORDER — GLUCOSE BLOOD VI STRP: ORAL_STRIP | Status: DC

## 2014-12-08 NOTE — Progress Notes (Signed)
   Subjective:   Patient ID: ANNELLA [name] female   DOB: 11/14/1965 49 y.o.   MRN: [id number]  HPI: [name] is a 49 y.o. female w/ PMHx of DM type II, HTN, Sickle cell trait, and developmental delay, presents to the clinic for follow up for her DM type II and HTN. Patient has no complaints today, says she is doing well, working most days. States her weight has been the same recently, has no chest pain, SOB, dizziness, lightheadedness, fever, chills, nausea, or vomiting. Has been compliant with her medications. BP slightly elevated today.   Current Outpatient Prescriptions  Medication Sig Dispense Refill  . ACCU-CHEK FASTCLIX LANCETS MISC 1 Device by Does not apply route 1 day or 1 dose. Dx: 250.00 1 each 11  . ACCU-CHEK SMARTVIEW test strip USE AS DIRECTED 100 each 11  . Blood Glucose Monitoring Suppl (ACCU-CHEK NANO SMARTVIEW) W/DEVICE KIT 1 Device by Does not apply route 1 day or 1 dose. Dx; 250.00 1 kit 1  . ibuprofen (ADVIL,MOTRIN) 600 MG tablet Take 1 tablet (600 mg total) by mouth 3 (three) times daily. With food for 3 to 5 days. 30 tablet 0  . lisinopril (PRINIVIL,ZESTRIL) 5 MG tablet TAKE 1 TABLET BY MOUTH EVERY DAY 30 tablet 5  . meloxicam (MOBIC) 7.5 MG tablet TAKE 1 TABLET BY MOUTH EVERY DAY AS NEEDED FOR PAIN 30 tablet 0  . metFORMIN (GLUCOPHAGE) 1000 MG tablet Take 1 tablet (1,000 mg total) by mouth 2 (two) times daily with a meal. 180 tablet 1  . pravastatin (PRAVACHOL) 20 MG tablet TAKE 1 TABLET BY MOUTH EVERY EVENING 30 tablet 5  . terbinafine (LAMISIL) 250 MG tablet Take 1 tablet (250 mg total) by mouth daily. 90 tablet 0   No current facility-administered medications for this visit.   Review of Systems  General: Denies fever, diaphoresis, appetite change, and fatigue.  Respiratory: Denies SOB, cough, and wheezing.   Cardiovascular: Denies chest pain and palpitations.  Gastrointestinal: Denies nausea, vomiting, abdominal pain, and diarrhea Musculoskeletal:  Denies myalgias, arthralgias, back pain, and gait problem.  Neurological: Denies dizziness, syncope, weakness, lightheadedness, and headaches.  Psychiatric/Behavioral: Denies mood changes, sleep disturbance, and agitation.   Objective:   Physical Exam: Filed Vitals:   12/08/14 1530  BP: 151/84  Pulse: 90  Temp: 98.4 F (36.9 C)  Weight: 183 lb 1.6 oz (83.054 kg)  SpO2: 99%    General: Overweight AA female, alert, cooperative, NAD. HEENT: PERRL, EOMI. Moist mucus membranes Neck: Full range of motion without pain, supple, no lymphadenopathy or carotid bruits Lungs: Clear to ascultation bilaterally, normal work of respiration, no wheezes, rales, rhonchi Heart: RRR. 2/6 systolic murmur heard loudest over the right sternal border.  Abdomen: Soft, non-tender, non-distended, BS + Extremities: No cyanosis, clubbing, or edema.  Neurologic: Alert & oriented x3, cranial nerves II-XII intact, strength grossly intact, sensation intact to light touch  Assessment & Plan:   Please see problem based assessment and plan.

## 2014-12-08 NOTE — Patient Instructions (Signed)
General Instructions:  1. Please make a follow up appointment for 3 months.   2. Please take all medications as previously prescribed with the following changes:  Increase Lisinopril to 10 mg daily.   3. If you have worsening of your symptoms or new symptoms arise, please call the clinic ([phone]), or go to the ER immediately if symptoms are severe.  You have done a great job in taking all your medications. Please continue to do this.   Thank you for bringing your medicines today. This helps [place] keep you safe from mistakes.   Progress Toward Treatment Goals:  Treatment Goal 12/08/2014  Hemoglobin A1C at goal  Blood pressure deteriorated    Self Care Goals & Plans:  Self Care Goal 12/08/2014  Manage my medications take my medicines as prescribed  Monitor my health keep track of my blood glucose  Eat healthy foods -  Be physically active -  Meeting treatment goals maintain the current self-care plan    Home Blood Glucose Monitoring 12/08/2014  Check my blood sugar once a day  When to check my blood sugar before breakfast     Care Management & Community Referrals:  Referral 12/08/2014  Referrals made for care management support none needed  Referrals made to community resources none

## 2014-12-09 LAB — BMP8+ANION GAP
Anion Gap: 19 mmol/L — ABNORMAL HIGH (ref 10.0–18.0)
BUN / CREAT RATIO: 22 (ref 9–23)
BUN: 9 mg/dL (ref 6–24)
CHLORIDE: 96 mmol/L — AB (ref 97–106)
CO2: 23 mmol/L (ref 18–29)
CREATININE: 0.41 mg/dL — AB (ref 0.57–1.00)
Calcium: 9.8 mg/dL (ref 8.7–10.2)
GFR, EST AFRICAN AMERICAN: 141 mL/min/{1.73_m2} (ref >59)
GFR, EST NON AFRICAN AMERICAN: 123 mL/min/{1.73_m2} (ref >59)
Glucose: 130 mg/dL — ABNORMAL HIGH (ref 65–99)
Potassium: 3.9 mmol/L (ref 3.5–5.2)
Sodium: 138 mmol/L (ref 136–144)

## 2014-12-09 LAB — HIV ANTIBODY (ROUTINE TESTING W REFLEX): HIV SCREEN 4TH GENERATION: NONREACTIVE

## 2014-12-09 LAB — MICROALBUMIN / CREATININE URINE RATIO
Creatinine, Urine: 93.9 mg/dL
MICROALB/CREAT RATIO: 8.8 mg/g creat (ref 0.0–30.0)
Microalbumin, Urine: 8.3 ug/mL

## 2014-12-09 NOTE — Assessment & Plan Note (Signed)
BP Readings from Last 3 Encounters:  12/08/14 151/84  09/09/14 143/89  04/14/14 136/73    Lab Results  Component Value Date   NA 138 12/08/2014   K 3.9 12/08/2014   CREATININE 0.41* 12/08/2014    Assessment: Blood pressure control: mildly elevated Progress toward BP goal:  deteriorated Comments: Taking Lisinopril 5 mg qd  Plan: Medications:  Increase Lisinopril to 10 mg qd Other plans: Recheck BMP, wnl. RTC in 3 months

## 2014-12-09 NOTE — Assessment & Plan Note (Signed)
Lab Results  Component Value Date   HGBA1C 6.4 12/08/2014   HGBA1C 6.4 04/14/2014   HGBA1C 6.2 01/13/2014     Assessment: Diabetes control: good control (HgbA1C at goal) Progress toward A1C goal:  at goal Comments: Well controlled, still taking Metformin 1000 mg bid. Says Jaime [name] hasn't been able to buy strips for some reason involving her insurance. Given well controlled, DM, not too concerning.   Plan: Medications:  continue current medications. Resent Rx for strips. If unable to purchase, patient does not need to check regularly.  Home glucose monitoring: Frequency: once a day Timing: before breakfast Instruction/counseling given: reminded to bring blood glucose meter & log to each visit, reminded to bring medications to each visit, discussed foot care, discussed the need for weight loss and discussed diet Educational resources provided:   Self management tools provided:   Other plans: RTC in 3 months.

## 2014-12-09 NOTE — Assessment & Plan Note (Signed)
Flu shot given today. Check urine microablumin/Cr, HIV.

## 2014-12-10 ENCOUNTER — Ambulatory Visit: Payer: Medicaid Other | Admitting: Podiatry

## 2014-12-14 NOTE — Progress Notes (Signed)
Internal Medicine Clinic Attending  Case discussed with Dr. Jones soon after the resident saw the patient.  We reviewed the resident's history and exam and pertinent patient test results.  I agree with the assessment, diagnosis, and plan of care documented in the resident's note. 

## 2014-12-22 ENCOUNTER — Ambulatory Visit
Admission: RE | Admit: 2014-12-22 | Discharge: 2014-12-22 | Disposition: A | Discharge status: Home / Self Care | Payer: Medicaid Other | Source: Ambulatory Visit

## 2014-12-22 DIAGNOSIS — Z1231 Encounter for screening mammogram for malignant neoplasm of breast: Secondary | ICD-10-CM

## 2015-01-07 ENCOUNTER — Ambulatory Visit (INDEPENDENT_AMBULATORY_CARE_PROVIDER_SITE_OTHER): Payer: Medicaid Other | Admitting: Podiatry

## 2015-01-07 ENCOUNTER — Encounter: Payer: Self-pay | Admitting: Podiatry

## 2015-01-07 DIAGNOSIS — E1140 Type 2 diabetes mellitus with diabetic neuropathy, unspecified: Secondary | ICD-10-CM

## 2015-01-07 DIAGNOSIS — B351 Tinea unguium: Secondary | ICD-10-CM | POA: Diagnosis not present

## 2015-01-07 DIAGNOSIS — M79676 Pain in unspecified toe(s): Secondary | ICD-10-CM

## 2015-01-07 NOTE — Progress Notes (Signed)
Patient ID: Jaime [name], female   DOB: 04/07/1965, 49 y.o.   MRN: [id number] Complaint:  Visit Type: Patient returns to my office for continued preventative foot care services. Complaint: Patient states" my nails have grown long and thick and become painful to walk and wear shoes" Patient has been diagnosed with DM with no foot complications. The patient presents for preventative foot care services. No changes to ROS  Podiatric Exam: Vascular: dorsalis pedis and posterior tibial pulses are palpable bilateral. Capillary return is immediate. Temperature gradient is WNL. Skin turgor WNL  Sensorium: Normal Semmes Weinstein monofilament test. Normal tactile sensation bilaterally. Nail Exam: Pt has thick disfigured discolored nails with subungual debris noted bilateral entire nail hallux through fifth toenails Ulcer Exam: There is no evidence of ulcer or pre-ulcerative changes or infection. Orthopedic Exam: Muscle tone and strength are WNL. No limitations in general ROM. No crepitus or effusions noted. Foot type and digits show no abnormalities. Bony prominences are unremarkable. Skin: No Porokeratosis. No infection or ulcers  Diagnosis:  Onychomycosis, , Pain in right toe, pain in left toes  Treatment & Plan Procedures and Treatment: Consent by patient was obtained for treatment procedures. The patient understood the discussion of treatment and procedures well. All questions were answered thoroughly reviewed. Debridement of mycotic and hypertrophic toenails, 1 through 5 bilateral and clearing of subungual debris. No ulceration, no infection noted.  Return Visit-Office Procedure: Patient instructed to return to the office for a follow up visit 3 months for continued evaluation and treatment.  Gardiner Barefoot DPM

## 2015-03-06 ENCOUNTER — Other Ambulatory Visit: Payer: Self-pay | Admitting: Internal Medicine

## 2015-04-05 ENCOUNTER — Telehealth: Payer: Self-pay | Admitting: Internal Medicine

## 2015-04-05 NOTE — Telephone Encounter (Signed)
APPT REMINDER CALL, LMTCB IF SHE NEEDS TO CANCEL °

## 2015-04-06 ENCOUNTER — Ambulatory Visit (INDEPENDENT_AMBULATORY_CARE_PROVIDER_SITE_OTHER): Payer: Medicaid Other | Admitting: Internal Medicine

## 2015-04-06 VITALS — BP 130/89 | HR 96 | Temp 98.3°F | Ht 62.0 in | Wt 186.7 lb

## 2015-04-06 DIAGNOSIS — E669 Obesity, unspecified: Secondary | ICD-10-CM

## 2015-04-06 DIAGNOSIS — Z7984 Long term (current) use of oral hypoglycemic drugs: Secondary | ICD-10-CM | POA: Diagnosis not present

## 2015-04-06 DIAGNOSIS — Z79899 Other long term (current) drug therapy: Secondary | ICD-10-CM

## 2015-04-06 DIAGNOSIS — Z0000 Encounter for general adult medical examination without abnormal findings: Secondary | ICD-10-CM

## 2015-04-06 DIAGNOSIS — E119 Type 2 diabetes mellitus without complications: Secondary | ICD-10-CM

## 2015-04-06 DIAGNOSIS — I10 Essential (primary) hypertension: Secondary | ICD-10-CM | POA: Diagnosis not present

## 2015-04-06 DIAGNOSIS — Z6834 Body mass index (BMI) 34.0-34.9, adult: Secondary | ICD-10-CM | POA: Diagnosis not present

## 2015-04-06 LAB — GLUCOSE, CAPILLARY: GLUCOSE-CAPILLARY: 90 mg/dL (ref 65–99)

## 2015-04-06 LAB — POCT GLYCOSYLATED HEMOGLOBIN (HGB A1C): HEMOGLOBIN A1C: 7

## 2015-04-06 NOTE — Assessment & Plan Note (Signed)
Foot exam today. 

## 2015-04-06 NOTE — Assessment & Plan Note (Signed)
Lab Results  Component Value Date   HGBA1C 7.0 04/06/2015   HGBA1C 6.4 12/08/2014   HGBA1C 6.4 04/14/2014     Assessment: Diabetes control:  Controlled Progress toward A1C goal:   At goal, but deteriorated Comments: Continues to take metformin 1000 mg twice daily. Does not seem serious about diet and exercise, therefore re-emphasized the importance of this today.   Plan: Medications:  continue current medications. Would consider adding low dose sulfonurea at breakfast if HbA1c continues to increase and would re-schedule visit with diabetes educator.  Home glucose monitoring: Frequency:  daily Timing:   qAM Instruction/counseling given: reminded to bring blood glucose meter & log to each visit, reminded to bring medications to each visit, discussed foot care, discussed the need for weight loss and discussed diet Other plans: RTC in 3 months.

## 2015-04-06 NOTE — Assessment & Plan Note (Signed)
Weight fairly stable, slightly increased from previous. HbA1c increased to 7.0. Discussed importance of healthy diet and regular exercise.

## 2015-04-06 NOTE — Patient Instructions (Signed)
1. Please make a follow up in 3 months.   Continue to focus on healthy diet and exercise.   2. Please take all medications as previously prescribed.  3. If you have worsening of your symptoms or new symptoms arise, please call the clinic [phone]), or go to the ER immediately if symptoms are severe.  You have done a great job in taking all your medications. Please continue to do this.

## 2015-04-06 NOTE — Assessment & Plan Note (Signed)
BP Readings from Last 3 Encounters:  04/06/15 130/89  12/08/14 151/84  09/09/14 143/89    Lab Results  Component Value Date   NA 138 12/08/2014   K 3.9 12/08/2014   CREATININE 0.41* 12/08/2014    Assessment: Blood pressure control:  Controlled Progress toward BP goal:   At goal Comments: Taking Lisinopril 10 mg daily. Recent urine microalbumin/cr looks good at last visit.   Plan: Medications:  continue current medications Other plans: RTC in 3 months.

## 2015-04-06 NOTE — Progress Notes (Signed)
   Subjective:   Patient ID: Jaime [name] female   DOB: 28-Feb-1965 50 y.o.   MRN: [id number]  HPI: Jaime [name] is a 50 y.o. female w/ PMHx of DM type II, HTN, Sickle cell trait, and developmental delay, presents to the clinic for follow up for her DM type II and HTN. She has no specific complaints today, says she is doing very well. No chest pain, SOB, nausea, vomiting, fever, chills, abdominal pain, or diarrhea. Has been trying to exercise but has not been doing anything regularly. Weight generally unchanged.    Current Outpatient Prescriptions  Medication Sig Dispense Refill  . ACCU-CHEK FASTCLIX LANCETS MISC 1 Device by Does not apply route 1 day or 1 dose. Dx: 250.00 1 each 11  . Blood Glucose Monitoring Suppl (ACCU-CHEK NANO SMARTVIEW) W/DEVICE KIT 1 Device by Does not apply route 1 day or 1 dose. Dx; 250.00 1 kit 1  . glucose blood (ACCU-CHEK SMARTVIEW) test strip Use to check blood sugar once or twice daily. ICD10: E11.9 100 each 11  . ibuprofen (ADVIL,MOTRIN) 600 MG tablet Take 1 tablet (600 mg total) by mouth 3 (three) times daily. With food for 3 to 5 days. 30 tablet 0  . lisinopril (PRINIVIL,ZESTRIL) 10 MG tablet Take 1 tablet (10 mg total) by mouth daily. 30 tablet 5  . meloxicam (MOBIC) 7.5 MG tablet TAKE 1 TABLET BY MOUTH EVERY DAY AS NEEDED FOR PAIN 30 tablet 0  . metFORMIN (GLUCOPHAGE) 1000 MG tablet Take 1 tablet (1,000 mg total) by mouth 2 (two) times daily with a meal. 180 tablet 1  . pravastatin (PRAVACHOL) 20 MG tablet TAKE 1 TABLET BY MOUTH EVERY EVENING 30 tablet 5   No current facility-administered medications for this visit.   Review of Systems  General: Denies fever, diaphoresis, appetite change, and fatigue.  Respiratory: Denies SOB, cough, and wheezing.   Cardiovascular: Denies chest pain and palpitations.  Gastrointestinal: Denies nausea, vomiting, abdominal pain, and diarrhea Musculoskeletal: Denies myalgias, arthralgias, back pain, and gait problem.   Neurological: Denies dizziness, syncope, weakness, lightheadedness, and headaches.  Psychiatric/Behavioral: Denies mood changes, sleep disturbance, and agitation.   Objective:   Physical Exam: Filed Vitals:   04/06/15 1324 04/06/15 1328  BP: 141/85 130/89  Pulse: 87 96  Temp: 98.3 F (36.8 C)   TempSrc: Oral   Height: '5\' 2"'$  (1.575 m)   Weight: 186 lb 11.2 oz (84.687 kg)   SpO2: 100%     General: Overweight AA female, alert, cooperative, NAD. HEENT: PERRL, EOMI. Moist mucus membranes Neck: Full range of motion without pain, supple, no lymphadenopathy or carotid bruits Lungs: Clear to ascultation bilaterally, normal work of respiration, no wheezes, rales, rhonchi Heart: RRR. 2/6 systolic murmur heard loudest over the right sternal border.  Abdomen: Soft, non-tender, non-distended, BS + Extremities: No cyanosis, clubbing, or edema.  Neurologic: Alert & oriented x3, cranial nerves II-XII intact, strength grossly intact, sensation intact to light touch  Assessment & Plan:   Please see problem based assessment and plan.

## 2015-04-08 ENCOUNTER — Ambulatory Visit: Payer: Medicaid Other | Admitting: Podiatry

## 2015-04-09 NOTE — Progress Notes (Signed)
Internal Medicine Clinic Attending  Case discussed with Dr. Jones at the time of the visit.  We reviewed the resident's history and exam and pertinent patient test results.  I agree with the assessment, diagnosis, and plan of care documented in the resident's note.  

## 2015-04-22 ENCOUNTER — Encounter: Payer: Self-pay | Admitting: Podiatry

## 2015-04-22 ENCOUNTER — Ambulatory Visit (INDEPENDENT_AMBULATORY_CARE_PROVIDER_SITE_OTHER): Payer: Medicaid Other | Admitting: Podiatry

## 2015-04-22 ENCOUNTER — Other Ambulatory Visit: Payer: Self-pay | Admitting: Internal Medicine

## 2015-04-22 DIAGNOSIS — E1140 Type 2 diabetes mellitus with diabetic neuropathy, unspecified: Secondary | ICD-10-CM | POA: Diagnosis not present

## 2015-04-22 DIAGNOSIS — B351 Tinea unguium: Secondary | ICD-10-CM | POA: Diagnosis not present

## 2015-04-22 DIAGNOSIS — M79676 Pain in unspecified toe(s): Secondary | ICD-10-CM

## 2015-04-22 NOTE — Progress Notes (Signed)
Patient ID: Jaime [name], female   DOB: 04/07/1965, 50 y.o.   MRN: [id number] Complaint:  Visit Type: Patient returns to my office for continued preventative foot care services. Complaint: Patient states" my nails have grown long and thick and become painful to walk and wear shoes" Patient has been diagnosed with DM with no foot complications. The patient presents for preventative foot care services. No changes to ROS  Podiatric Exam: Vascular: dorsalis pedis and posterior tibial pulses are palpable bilateral. Capillary return is immediate. Temperature gradient is WNL. Skin turgor WNL  Sensorium: Normal Semmes Weinstein monofilament test. Normal tactile sensation bilaterally. Nail Exam: Pt has thick disfigured discolored nails with subungual debris noted bilateral entire nail hallux through fifth toenails Ulcer Exam: There is no evidence of ulcer or pre-ulcerative changes or infection. Orthopedic Exam: Muscle tone and strength are WNL. No limitations in general ROM. No crepitus or effusions noted. Foot type and digits show no abnormalities. Bony prominences are unremarkable. Skin: No Porokeratosis. No infection or ulcers  Diagnosis:  Onychomycosis, , Pain in right toe, pain in left toes  Treatment & Plan Procedures and Treatment: Consent by patient was obtained for treatment procedures. The patient understood the discussion of treatment and procedures well. All questions were answered thoroughly reviewed. Debridement of mycotic and hypertrophic toenails, 1 through 5 bilateral and clearing of subungual debris. No ulceration, no infection noted.  Return Visit-Office Procedure: Patient instructed to return to the office for a follow up visit 3 months for continued evaluation and treatment.  Gardiner Barefoot DPM

## 2015-05-20 ENCOUNTER — Other Ambulatory Visit: Payer: Self-pay | Admitting: Internal Medicine

## 2015-06-13 ENCOUNTER — Other Ambulatory Visit: Payer: Self-pay | Admitting: Oncology

## 2015-07-05 ENCOUNTER — Telehealth: Payer: Self-pay | Admitting: Internal Medicine

## 2015-07-05 NOTE — Telephone Encounter (Signed)
APT. REMINDER CALL, LMTCB °

## 2015-07-06 ENCOUNTER — Ambulatory Visit (INDEPENDENT_AMBULATORY_CARE_PROVIDER_SITE_OTHER): Payer: Medicaid Other | Admitting: Internal Medicine

## 2015-07-06 ENCOUNTER — Encounter: Payer: Self-pay | Admitting: Internal Medicine

## 2015-07-06 VITALS — BP 149/90 | HR 82 | Temp 98.0°F | Ht 62.0 in | Wt 187.3 lb

## 2015-07-06 DIAGNOSIS — I10 Essential (primary) hypertension: Secondary | ICD-10-CM

## 2015-07-06 DIAGNOSIS — Z7984 Long term (current) use of oral hypoglycemic drugs: Secondary | ICD-10-CM | POA: Diagnosis not present

## 2015-07-06 DIAGNOSIS — E119 Type 2 diabetes mellitus without complications: Secondary | ICD-10-CM | POA: Diagnosis not present

## 2015-07-06 DIAGNOSIS — Z79899 Other long term (current) drug therapy: Secondary | ICD-10-CM

## 2015-07-06 DIAGNOSIS — B351 Tinea unguium: Secondary | ICD-10-CM | POA: Diagnosis not present

## 2015-07-06 DIAGNOSIS — Z0000 Encounter for general adult medical examination without abnormal findings: Secondary | ICD-10-CM

## 2015-07-06 LAB — GLUCOSE, CAPILLARY: Glucose-Capillary: 125 mg/dL — ABNORMAL HIGH (ref 65–99)

## 2015-07-06 LAB — POCT GLYCOSYLATED HEMOGLOBIN (HGB A1C): HEMOGLOBIN A1C: 6.8

## 2015-07-06 MED ORDER — LISINOPRIL 20 MG PO TABS: 20.0000 mg | ORAL_TABLET | Freq: Every day | ORAL | Status: DC

## 2015-07-06 NOTE — Assessment & Plan Note (Signed)
Lab Results  Component Value Date   HGBA1C 6.8 07/06/2015   HGBA1C 7.0 04/06/2015   HGBA1C 6.4 12/08/2014     Assessment: Diabetes control:  At goal Progress toward A1C goal:   Improved Comments: Taking Metformin 1000 mg bid  Plan: Medications:  continue current medications Home glucose monitoring: Frequency:  qAM Instruction/counseling given: reminded to bring blood glucose meter & log to each visit, reminded to bring medications to each visit, discussed the need for weight loss and discussed diet Other plans: RTC in 6 weeks

## 2015-07-06 NOTE — Progress Notes (Signed)
   Subjective:   Patient ID: SHALEY LEAVENS female   DOB: 10/19/1965 50 y.o.   MRN: [id number]  HPI: Ms. SHERRILYNN GUDGEL is a 50 y.o. female w/ PMHx of DM type II, HTN, Sickle cell trait, and developmental delay, presents to the clinic for follow up for her DM type II and HTN. Patient says she has been dealing with a lot of stress recently, says she has recently had a cousin pass away and another is not doing well. She has been taking her medications as previously prescribed and has been better about eating healthy since her last clinic visit where her HbA1c had increased to 7.0. Today her BP is elevated, no symptoms. Feels well other than the stress described above.   Current Outpatient Prescriptions  Medication Sig Dispense Refill  . ACCU-CHEK FASTCLIX LANCETS MISC 1 Device by Does not apply route 1 day or 1 dose. Dx: 250.00 1 each 11  . Blood Glucose Monitoring Suppl (ACCU-CHEK NANO SMARTVIEW) W/DEVICE KIT 1 Device by Does not apply route 1 day or 1 dose. Dx; 250.00 1 kit 1  . glucose blood (ACCU-CHEK SMARTVIEW) test strip Use to check blood sugar once or twice daily. ICD10: E11.9 100 each 11  . ibuprofen (ADVIL,MOTRIN) 600 MG tablet Take 1 tablet (600 mg total) by mouth 3 (three) times daily. With food for 3 to 5 days. 30 tablet 0  . lisinopril (PRINIVIL,ZESTRIL) 10 MG tablet TAKE 1 TABLET(10 MG) BY MOUTH DAILY 30 tablet 5  . meloxicam (MOBIC) 7.5 MG tablet TAKE 1 TABLET BY MOUTH EVERY DAY AS NEEDED FOR PAIN 30 tablet 0  . metFORMIN (GLUCOPHAGE) 1000 MG tablet TAKE 1 TABLET(1000 MG) BY MOUTH TWICE DAILY WITH A MEAL 180 tablet 1  . pravastatin (PRAVACHOL) 20 MG tablet TAKE 1 TABLET BY MOUTH EVERY EVENING 30 tablet 5   No current facility-administered medications for this visit.   Review of Systems  General: Denies fever, diaphoresis, appetite change, and fatigue.  Respiratory: Denies SOB, cough, and wheezing.   Cardiovascular: Denies chest pain and palpitations.  Gastrointestinal: Denies  nausea, vomiting, abdominal pain, and diarrhea Musculoskeletal: Denies myalgias, arthralgias, back pain, and gait problem.  Neurological: Denies dizziness, syncope, weakness, lightheadedness, and headaches.  Psychiatric/Behavioral: Denies mood changes, sleep disturbance, and agitation.   Objective:   Physical Exam: Filed Vitals:   07/06/15 1331 07/06/15 1355  BP: 169/91 149/90  Pulse: 86 82  Temp: 98 F (36.7 C)   TempSrc: Oral   Height: _0  (1.575 m)   Weight: 187 lb 4.8 oz (84.959 kg)   SpO2: 100%     General: Overweight AA female, alert, cooperative, NAD. HEENT: PERRL, EOMI. Moist mucus membranes Neck: Full range of motion without pain, supple, no lymphadenopathy or carotid bruits Lungs: Clear to ascultation bilaterally, normal work of respiration, no wheezes, rales, rhonchi Heart: RRR. 2/6 systolic murmur heard loudest over the right sternal border.  Abdomen: Soft, non-tender, non-distended, BS + Extremities: No cyanosis, clubbing, or edema.  Neurologic: Alert & oriented x3, cranial nerves II-XII intact, strength grossly intact, sensation intact to light touch  Assessment & Plan:   Please see problem based assessment and plan.

## 2015-07-06 NOTE — Assessment & Plan Note (Signed)
BP Readings from Last 3 Encounters:  07/06/15 149/90  04/06/15 130/89  12/08/14 151/84    Lab Results  Component Value Date   NA 138 12/08/2014   K 3.9 12/08/2014   CREATININE 0.41* 12/08/2014    Assessment: Blood pressure control:  Elevated Progress toward BP goal:   Not at goal Comments: Taking Lisinopril 10 mg daily. Says she has had a lot of stress at home, however, I have been on the verge of increasing her BP medication for some time.   Plan: Medications:  Increase Lisinopril to 20 mg daily. May not see much benefit from this given her race and may require additional medication in the future, however, given her DM type II, feel this is a reasonable change at this time.  Other plans: RTC in 6 weeks for BP recheck.

## 2015-07-06 NOTE — Assessment & Plan Note (Signed)
Check lipids today 

## 2015-07-06 NOTE — Assessment & Plan Note (Signed)
Managed by podiatry

## 2015-07-06 NOTE — Patient Instructions (Addendum)
1. Please make a follow up appointment for 6 weeks.   2. Please take all medications as previously prescribed with the following changes:  Increase Lisinopril to 20 mg daily.   Continue to try and lose weight, exercise, and eat healthy.   3. If you have worsening of your symptoms or new symptoms arise, please call the clinic [phone]), or go to the ER immediately if symptoms are severe.  You have done a great job in taking all your medications. Please continue to do this.

## 2015-07-07 LAB — LIPID PANEL
CHOL/HDL RATIO: 3 ratio (ref 0.0–4.4)
Cholesterol, Total: 136 mg/dL (ref 100–199)
HDL: 46 mg/dL (ref >39)
LDL Calculated: 77 mg/dL (ref 0–99)
Triglycerides: 65 mg/dL (ref 0–149)
VLDL Cholesterol Cal: 13 mg/dL (ref 5–40)

## 2015-07-07 NOTE — Progress Notes (Signed)
Internal Medicine Clinic Attending  Case discussed with Dr. Jones at the time of the visit.  We reviewed the resident's history and exam and pertinent patient test results.  I agree with the assessment, diagnosis, and plan of care documented in the resident's note.  

## 2015-07-12 ENCOUNTER — Encounter: Payer: Self-pay | Admitting: *Deleted

## 2015-07-22 ENCOUNTER — Ambulatory Visit (INDEPENDENT_AMBULATORY_CARE_PROVIDER_SITE_OTHER): Payer: Medicaid Other | Admitting: Podiatry

## 2015-07-22 ENCOUNTER — Encounter: Payer: Self-pay | Admitting: Podiatry

## 2015-07-22 DIAGNOSIS — B351 Tinea unguium: Secondary | ICD-10-CM | POA: Diagnosis not present

## 2015-07-22 DIAGNOSIS — E1140 Type 2 diabetes mellitus with diabetic neuropathy, unspecified: Secondary | ICD-10-CM

## 2015-07-22 DIAGNOSIS — M79676 Pain in unspecified toe(s): Secondary | ICD-10-CM | POA: Diagnosis not present

## 2015-07-22 NOTE — Progress Notes (Signed)
Patient ID: Jaime [name], female   DOB: 04/07/1965, 50 y.o.   MRN: [id number] Complaint:  Visit Type: Patient returns to my office for continued preventative foot care services. Complaint: Patient states" my nails have grown long and thick and become painful to walk and wear shoes" Patient has been diagnosed with DM with no foot complications. The patient presents for preventative foot care services. No changes to ROS  Podiatric Exam: Vascular: dorsalis pedis and posterior tibial pulses are palpable bilateral. Capillary return is immediate. Temperature gradient is WNL. Skin turgor WNL  Sensorium: Normal Semmes Weinstein monofilament test. Normal tactile sensation bilaterally. Nail Exam: Pt has thick disfigured discolored nails with subungual debris noted bilateral entire nail hallux through fifth toenails Ulcer Exam: There is no evidence of ulcer or pre-ulcerative changes or infection. Orthopedic Exam: Muscle tone and strength are WNL. No limitations in general ROM. No crepitus or effusions noted. Foot type and digits show no abnormalities. Bony prominences are unremarkable. Skin: No Porokeratosis. No infection or ulcers  Diagnosis:  Onychomycosis, , Pain in right toe, pain in left toes  Treatment & Plan Procedures and Treatment: Consent by patient was obtained for treatment procedures. The patient understood the discussion of treatment and procedures well. All questions were answered thoroughly reviewed. Debridement of mycotic and hypertrophic toenails, 1 through 5 bilateral and clearing of subungual debris. No ulceration, no infection noted.  Return Visit-Office Procedure: Patient instructed to return to the office for a follow up visit 3 months for continued evaluation and treatment.  Gardiner Barefoot DPM

## 2015-09-05 NOTE — Progress Notes (Signed)
   CC: Meet new PCP  HPI:  Jaime [name] is a 50 y.o. woman who presents for 6 week follow-up for hypertension and to meet her new PCP.  She is feeling well and has no complaints.  Past Medical History:  Diagnosis Date  . Diabetes mellitus   . DUB (dysfunctional uterine bleeding)    total abdominal hysterectomy in 2007, ovaries intact  . Furuncle of multiple sites    multiple I and D's  . GERD (gastroesophageal reflux disease)   . Hydradenitis     Review of Systems:  Review of Systems  Constitutional: Negative for chills, fever and malaise/fatigue.  HENT: Negative for sore throat.   Eyes: Negative for blurred vision, double vision and pain.  Respiratory: Negative for cough, shortness of breath and wheezing.   Cardiovascular: Negative for chest pain and palpitations.  Gastrointestinal: Negative for abdominal pain, blood in stool, constipation, diarrhea, heartburn, melena, nausea and vomiting.  Genitourinary: Negative for dysuria and hematuria.  Musculoskeletal: Negative for joint pain and myalgias.  Skin: Negative for itching and rash.  Neurological: Negative for dizziness, focal weakness and headaches.  Psychiatric/Behavioral: Negative for depression.    Physical Exam:  Vitals:   09/06/15 1501  BP: 140/84  Pulse: 93  Temp: 98.8 F (37.1 C)  TempSrc: Oral  SpO2: 99%  Weight: 181 lb 9.6 oz (82.4 kg)  Height: 5\' 3"  (1.6 m)   Physical Exam  Constitutional: She is oriented to person, place, and time. She appears well-developed and well-nourished.  HENT:  Head: Normocephalic and atraumatic.  Mouth/Throat: Oropharynx is clear and moist. No oropharyngeal exudate.  Eyes: Conjunctivae and EOM are normal. Pupils are equal, round, and reactive to light. No scleral icterus.  Neck: Normal range of motion. Neck supple.  Cardiovascular: Normal rate, regular rhythm, normal heart sounds and intact distal pulses.   Pulmonary/Chest: Effort normal and breath sounds normal. She  has no wheezes. She has no rales.  Abdominal: Soft. She exhibits no distension. There is no tenderness.  Musculoskeletal: Normal range of motion. She exhibits no edema, tenderness or deformity.  Lymphadenopathy:    She has no cervical adenopathy.  Neurological: She is alert and oriented to person, place, and time. She exhibits normal muscle tone.  Skin: Skin is warm and dry. No rash noted. No erythema.  Psychiatric: She has a normal mood and affect. Her behavior is normal.  Vitals reviewed.    Lab Results  Component Value Date   HGBA1C 6.8 07/06/2015   HGBA1C 7.0 04/06/2015   HGBA1C 6.4 12/08/2014   Component     Latest Ref Rng & Units 12/08/2014  Creatinine, Urine     Not Estab. mg/dL 93.9  Microalbum.,U,Random     Not Estab. ug/mL 8.3  MICROALB/CREAT RATIO     0.0 - 30.0 mg/g creat 8.8    BP Readings from Last 3 Encounters:  09/06/15 140/84  07/06/15 (!) 149/90  04/06/15 130/89      Assessment & Plan:   Please see Encounters tab for problem based charting.  Patient seen with Dr. Evette Doffing

## 2015-09-06 ENCOUNTER — Ambulatory Visit (INDEPENDENT_AMBULATORY_CARE_PROVIDER_SITE_OTHER): Payer: Medicaid Other | Admitting: Internal Medicine

## 2015-09-06 ENCOUNTER — Encounter: Payer: Self-pay | Admitting: Internal Medicine

## 2015-09-06 ENCOUNTER — Other Ambulatory Visit: Payer: Self-pay | Admitting: Internal Medicine

## 2015-09-06 VITALS — BP 140/84 | HR 93 | Temp 98.8°F | Ht 63.0 in | Wt 181.6 lb

## 2015-09-06 DIAGNOSIS — E119 Type 2 diabetes mellitus without complications: Secondary | ICD-10-CM

## 2015-09-06 DIAGNOSIS — Z7984 Long term (current) use of oral hypoglycemic drugs: Secondary | ICD-10-CM | POA: Diagnosis not present

## 2015-09-06 DIAGNOSIS — Z6832 Body mass index (BMI) 32.0-32.9, adult: Secondary | ICD-10-CM | POA: Diagnosis not present

## 2015-09-06 DIAGNOSIS — E669 Obesity, unspecified: Secondary | ICD-10-CM

## 2015-09-06 DIAGNOSIS — I10 Essential (primary) hypertension: Secondary | ICD-10-CM | POA: Diagnosis not present

## 2015-09-06 LAB — GLUCOSE, CAPILLARY: GLUCOSE-CAPILLARY: 120 mg/dL — AB (ref 65–99)

## 2015-09-06 NOTE — Assessment & Plan Note (Addendum)
Does not take BP at home (lost home cuff).  Lisinopril increased to 20 mg daily at last visit.  Borderline BP today. -Continue lisinopril 20 mg -BMP next visit -Take BP at home if possible -Consider adding second antihypertensive at next follow-up if needed

## 2015-09-06 NOTE — Patient Instructions (Signed)
Nice to meet you today.    Great job with your diabetes!  It is currently well controlled.  You do not have to check your blood sugars at home, but can if you want to.  Please see your eye doctor for yearly eye exam.

## 2015-09-06 NOTE — Assessment & Plan Note (Addendum)
Last A1c at goal.  On metformin 1000 mg twice daily. -get annual eye exam -continue moderate intensity statin for primary prevention -regular home BG checks not required

## 2015-09-06 NOTE — Assessment & Plan Note (Signed)
Discussed exercise and weight loss.  Enjoys walking her dogs and walking the the mall with her mother.  Encouraged her to do both regularly.

## 2015-09-08 NOTE — Progress Notes (Signed)
Internal Medicine Clinic Attending  I saw and evaluated the patient.  I personally confirmed the key portions of the history and exam documented by Dr. O'Sullivan and I reviewed pertinent patient test results.  The assessment, diagnosis, and plan were formulated together and I agree with the documentation in the resident's note.   

## 2015-10-21 ENCOUNTER — Ambulatory Visit (INDEPENDENT_AMBULATORY_CARE_PROVIDER_SITE_OTHER): Payer: Medicaid Other | Admitting: Podiatry

## 2015-10-21 ENCOUNTER — Encounter: Payer: Self-pay | Admitting: Podiatry

## 2015-10-21 VITALS — BP 139/85 | HR 107

## 2015-10-21 DIAGNOSIS — E1140 Type 2 diabetes mellitus with diabetic neuropathy, unspecified: Secondary | ICD-10-CM

## 2015-10-21 DIAGNOSIS — B351 Tinea unguium: Secondary | ICD-10-CM

## 2015-10-21 DIAGNOSIS — M79676 Pain in unspecified toe(s): Secondary | ICD-10-CM

## 2015-10-21 NOTE — Progress Notes (Signed)
Patient ID: Jaime [name], female   DOB: 04/06/1965, 50 y.o.   MRN: [id number] Complaint:  Visit Type: Patient returns to my office for continued preventative foot care services. Complaint: Patient states" my nails have grown long and thick and become painful to walk and wear shoes" Patient has been diagnosed with DM with no foot complications. The patient presents for preventative foot care services. No changes to ROS  Podiatric Exam: Vascular: dorsalis pedis and posterior tibial pulses are palpable bilateral. Capillary return is immediate. Temperature gradient is WNL. Skin turgor WNL  Sensorium: Normal Semmes Weinstein monofilament test. Normal tactile sensation bilaterally. Nail Exam: Pt has thick disfigured discolored nails with subungual debris noted bilateral entire nail hallux through fifth toenails Ulcer Exam: There is no evidence of ulcer or pre-ulcerative changes or infection. Orthopedic Exam: Muscle tone and strength are WNL. No limitations in general ROM. No crepitus or effusions noted. Foot type and digits show no abnormalities. Bony prominences are unremarkable. Skin: No Porokeratosis. No infection or ulcers  Diagnosis:  Onychomycosis, , Pain in right toe, pain in left toes  Treatment & Plan Procedures and Treatment: Consent by patient was obtained for treatment procedures. The patient understood the discussion of treatment and procedures well. All questions were answered thoroughly reviewed. Debridement of mycotic and hypertrophic toenails, 1 through 5 bilateral and clearing of subungual debris. No ulceration, no infection noted.  Return Visit-Office Procedure: Patient instructed to return to the office for a follow up visit 3 months for continued evaluation and treatment.  Maikel Neisler DPM 

## 2015-12-14 LAB — HM DIABETES EYE EXAM

## 2015-12-15 ENCOUNTER — Encounter: Payer: Self-pay | Admitting: *Deleted

## 2015-12-16 ENCOUNTER — Other Ambulatory Visit: Payer: Self-pay | Admitting: Oncology

## 2015-12-16 ENCOUNTER — Other Ambulatory Visit: Payer: Self-pay | Admitting: Internal Medicine

## 2015-12-16 DIAGNOSIS — Z1231 Encounter for screening mammogram for malignant neoplasm of breast: Secondary | ICD-10-CM

## 2015-12-17 ENCOUNTER — Encounter: Payer: Self-pay | Admitting: Internal Medicine

## 2015-12-17 DIAGNOSIS — E113299 Type 2 diabetes mellitus with mild nonproliferative diabetic retinopathy without macular edema, unspecified eye: Secondary | ICD-10-CM | POA: Insufficient documentation

## 2015-12-18 ENCOUNTER — Other Ambulatory Visit: Payer: Self-pay | Admitting: Internal Medicine

## 2015-12-18 DIAGNOSIS — E119 Type 2 diabetes mellitus without complications: Secondary | ICD-10-CM

## 2015-12-18 DIAGNOSIS — I10 Essential (primary) hypertension: Secondary | ICD-10-CM

## 2015-12-20 ENCOUNTER — Encounter: Payer: Medicaid Other | Admitting: Internal Medicine

## 2015-12-26 NOTE — Assessment & Plan Note (Addendum)
Lab Results  Component Value Date   HGBA1C 6.6 12/27/2015   HGBA1C 6.8 07/06/2015   HGBA1C 7.0 04/06/2015    Recent Labs  12/27/15 1418  GLUCAP 96   Current medications: metformin 1000 mg BID Current insulin: none  Assessment HgbA1c goal: <7.0 Glycemic control: well controlled Complications: nonproliferative retinopathy  Plan Medications: continue current meds Insulin: none Other:  -discussed diet, weight loss, and exercise -Just had eye exam on 11/14

## 2015-12-26 NOTE — Progress Notes (Signed)
   CC: Metformin refill  HPI:  [name] is a 50 y.o. woman with HTN and DM2 who presents for management of diabetes.  Please see A&P for status of the patient's chronic medical conditions.  She feels well with no complaints.  Past Medical History:  Diagnosis Date  . Diabetes mellitus   . DUB (dysfunctional uterine bleeding)    total abdominal hysterectomy in 2007, ovaries intact  . Furuncle of multiple sites    multiple I and D's  . GERD (gastroesophageal reflux disease)   . Hydradenitis     Review of Systems:   Review of Systems  Constitutional: Negative for chills and fever.  Respiratory: Negative for cough and shortness of breath.   Cardiovascular: Negative for chest pain and palpitations.  Gastrointestinal: Negative for constipation and diarrhea.  Genitourinary: Negative for dysuria and hematuria.  Skin: Negative for itching and rash.   Physical Exam:  Vitals:   12/27/15 1421  BP: (!) 145/85  Pulse: (!) 101  Temp: 98.6 F (37 C)  TempSrc: Oral  SpO2: 99%  Weight: 181 lb 6.4 oz (82.3 kg)  Height: 5\' 2"  (1.575 m)   Physical Exam  Constitutional: She is oriented to person, place, and time. She appears well-developed and well-nourished. No distress.  Cardiovascular:  RRR 2/6 systolic murmur  Pulmonary/Chest: Effort normal and breath sounds normal.  Neurological: She is alert and oriented to person, place, and time.  Psychiatric: She has a normal mood and affect. Her behavior is normal.    Assessment & Plan:   See Encounters Tab for problem based charting.  Patient seen with Dr. Dareen Piano

## 2015-12-26 NOTE — Assessment & Plan Note (Addendum)
BP Readings from Last 3 Encounters:  12/27/15 (!) 145/85  10/21/15 139/85  09/06/15 140/84   Lab Results  Component Value Date   CREATININE 0.41 (L) 12/08/2014   Lab Results  Component Value Date   K 3.9 12/08/2014   Takes stairs at work instead of elevators.  Walks for exercise 1-2x per week at mall with her mother.  Current medications: lisinopril 20 mg daily  Assessment BP goal: 140/90 BP control: near goal  Plan Medications:  -continue currents meds -may increase lisinopril dose at next visit if her BPs continue to hover close to goal Other:  -Discussed exercise, diet, and weight loss.

## 2015-12-27 ENCOUNTER — Ambulatory Visit (INDEPENDENT_AMBULATORY_CARE_PROVIDER_SITE_OTHER): Payer: Medicaid Other | Admitting: Internal Medicine

## 2015-12-27 ENCOUNTER — Encounter: Payer: Self-pay | Admitting: Internal Medicine

## 2015-12-27 VITALS — BP 145/85 | HR 101 | Temp 98.6°F | Ht 62.0 in | Wt 181.4 lb

## 2015-12-27 DIAGNOSIS — R011 Cardiac murmur, unspecified: Secondary | ICD-10-CM | POA: Diagnosis not present

## 2015-12-27 DIAGNOSIS — Z7984 Long term (current) use of oral hypoglycemic drugs: Secondary | ICD-10-CM | POA: Diagnosis not present

## 2015-12-27 DIAGNOSIS — I10 Essential (primary) hypertension: Secondary | ICD-10-CM

## 2015-12-27 DIAGNOSIS — E11319 Type 2 diabetes mellitus with unspecified diabetic retinopathy without macular edema: Secondary | ICD-10-CM

## 2015-12-27 DIAGNOSIS — E113299 Type 2 diabetes mellitus with mild nonproliferative diabetic retinopathy without macular edema, unspecified eye: Secondary | ICD-10-CM

## 2015-12-27 DIAGNOSIS — Z79899 Other long term (current) drug therapy: Secondary | ICD-10-CM | POA: Diagnosis not present

## 2015-12-27 LAB — POCT GLYCOSYLATED HEMOGLOBIN (HGB A1C): Hemoglobin A1C: 6.6

## 2015-12-27 LAB — GLUCOSE, CAPILLARY: GLUCOSE-CAPILLARY: 96 mg/dL (ref 65–99)

## 2015-12-27 MED ORDER — METFORMIN HCL 1000 MG PO TABS: 1000.0000 mg | ORAL_TABLET | Freq: Two times a day (BID) | ORAL | 2 refills | Status: DC

## 2015-12-27 NOTE — Patient Instructions (Signed)
Your blood pressure and diabetes are doing very well!  No changes are needed to your medicines right now.  I would like to get an ultrasound of your heart because I heard a murmur.  Lots of people have heart murmurs that are not anything to worry about, but its a good idea to take a look and make sure there's nothing we do about it.  I'll see you again in 3 months.

## 2015-12-27 NOTE — Assessment & Plan Note (Signed)
2/6 systolic murmur loudest at RUSB.  I did not appreciate this murmur at her last visit, but it has been previously noted.  Most likely benign, but could represent AS.  She has never had an echo.  -Echocardiogram

## 2015-12-28 LAB — BMP8+ANION GAP
ANION GAP: 19 mmol/L — AB (ref 10.0–18.0)
BUN/Creatinine Ratio: 17 (ref 9–23)
BUN: 7 mg/dL (ref 6–24)
CALCIUM: 9.5 mg/dL (ref 8.7–10.2)
CHLORIDE: 96 mmol/L (ref 96–106)
CO2: 25 mmol/L (ref 18–29)
Creatinine, Ser: 0.42 mg/dL — ABNORMAL LOW (ref 0.57–1.00)
GFR calc Af Amer: 139 mL/min/{1.73_m2} (ref >59)
GFR calc non Af Amer: 121 mL/min/{1.73_m2} (ref >59)
GLUCOSE: 110 mg/dL — AB (ref 65–99)
POTASSIUM: 3.6 mmol/L (ref 3.5–5.2)
Sodium: 140 mmol/L (ref 134–144)

## 2015-12-29 NOTE — Progress Notes (Signed)
Internal Medicine Clinic Attending  I saw and evaluated the patient.  I personally confirmed the key portions of the history and exam documented by Dr. O'Sullivan and I reviewed pertinent patient test results.  The assessment, diagnosis, and plan were formulated together and I agree with the documentation in the resident's note.   

## 2016-01-04 ENCOUNTER — Other Ambulatory Visit: Payer: Self-pay | Admitting: Internal Medicine

## 2016-01-04 DIAGNOSIS — Z1231 Encounter for screening mammogram for malignant neoplasm of breast: Secondary | ICD-10-CM

## 2016-01-06 ENCOUNTER — Ambulatory Visit: Payer: Medicaid Other

## 2016-01-06 ENCOUNTER — Ambulatory Visit
Admission: RE | Admit: 2016-01-06 | Discharge: 2016-01-06 | Disposition: A | Discharge status: Home / Self Care | Payer: Medicaid Other | Source: Ambulatory Visit | Attending: Internal Medicine | Admitting: Internal Medicine

## 2016-01-06 DIAGNOSIS — Z1231 Encounter for screening mammogram for malignant neoplasm of breast: Secondary | ICD-10-CM

## 2016-01-07 ENCOUNTER — Ambulatory Visit (HOSPITAL_COMMUNITY)
Admission: RE | Admit: 2016-01-07 | Discharge: 2016-01-07 | Disposition: A | Discharge status: Home / Self Care | Payer: Medicaid Other | Source: Ambulatory Visit | Attending: Internal Medicine | Admitting: Internal Medicine

## 2016-01-07 DIAGNOSIS — I10 Essential (primary) hypertension: Secondary | ICD-10-CM | POA: Insufficient documentation

## 2016-01-07 DIAGNOSIS — E119 Type 2 diabetes mellitus without complications: Secondary | ICD-10-CM | POA: Diagnosis not present

## 2016-01-07 DIAGNOSIS — I071 Rheumatic tricuspid insufficiency: Secondary | ICD-10-CM | POA: Diagnosis not present

## 2016-01-07 DIAGNOSIS — R011 Cardiac murmur, unspecified: Secondary | ICD-10-CM

## 2016-01-07 NOTE — Progress Notes (Signed)
  Echocardiogram 2D Echocardiogram has been performed.  Donata Clay 01/07/2016, 1:29 PM

## 2016-01-08 ENCOUNTER — Encounter: Payer: Self-pay | Admitting: Internal Medicine

## 2016-01-19 ENCOUNTER — Ambulatory Visit: Payer: Medicaid Other | Admitting: Podiatry

## 2016-01-21 ENCOUNTER — Other Ambulatory Visit: Payer: Self-pay | Admitting: Internal Medicine

## 2016-01-21 DIAGNOSIS — I10 Essential (primary) hypertension: Secondary | ICD-10-CM

## 2016-02-03 ENCOUNTER — Ambulatory Visit: Payer: Medicaid Other | Admitting: Podiatry

## 2016-02-10 ENCOUNTER — Ambulatory Visit (INDEPENDENT_AMBULATORY_CARE_PROVIDER_SITE_OTHER): Payer: Medicaid Other | Admitting: Podiatry

## 2016-02-10 ENCOUNTER — Encounter: Payer: Self-pay | Admitting: Podiatry

## 2016-02-10 VITALS — Ht 62.0 in | Wt 181.0 lb

## 2016-02-10 DIAGNOSIS — M79676 Pain in unspecified toe(s): Secondary | ICD-10-CM

## 2016-02-10 DIAGNOSIS — E1140 Type 2 diabetes mellitus with diabetic neuropathy, unspecified: Secondary | ICD-10-CM

## 2016-02-10 DIAGNOSIS — B351 Tinea unguium: Secondary | ICD-10-CM | POA: Diagnosis not present

## 2016-02-10 NOTE — Progress Notes (Signed)
Patient ID: Jaime [name], female   DOB: 12/11/1965, 50 y.o.   MRN: [id number] Complaint:  Visit Type: Patient returns to my office for continued preventative foot care services. Complaint: Patient states" my nails have grown long and thick and become painful to walk and wear shoes" Patient has been diagnosed with DM with no foot complications. The patient presents for preventative foot care services. No changes to ROS  Podiatric Exam: Vascular: dorsalis pedis and posterior tibial pulses are palpable bilateral. Capillary return is immediate. Temperature gradient is WNL. Skin turgor WNL  Sensorium: Normal Semmes Weinstein monofilament test. Normal tactile sensation bilaterally. Nail Exam: Pt has thick disfigured discolored nails with subungual debris noted bilateral entire nail hallux through fifth toenails Ulcer Exam: There is no evidence of ulcer or pre-ulcerative changes or infection. Orthopedic Exam: Muscle tone and strength are WNL. No limitations in general ROM. No crepitus or effusions noted. Foot type and digits show no abnormalities. Bony prominences are unremarkable. Skin: No Porokeratosis. No infection or ulcers  Diagnosis:  Onychomycosis, , Pain in right toe, pain in left toes  Treatment & Plan Procedures and Treatment: Consent by patient was obtained for treatment procedures. The patient understood the discussion of treatment and procedures well. All questions were answered thoroughly reviewed. Debridement of mycotic and hypertrophic toenails, 1 through 5 bilateral and clearing of subungual debris. No ulceration, no infection noted.  Return Visit-Office Procedure: Patient instructed to return to the office for a follow up visit 3 months for continued evaluation and treatment.  Nekisha Mcdiarmid DPM 

## 2016-03-02 ENCOUNTER — Other Ambulatory Visit: Payer: Self-pay | Admitting: Internal Medicine

## 2016-03-02 NOTE — Telephone Encounter (Signed)
appt 03/27/16 w/pcp

## 2016-03-24 ENCOUNTER — Telehealth: Payer: Self-pay | Admitting: Internal Medicine

## 2016-03-24 NOTE — Telephone Encounter (Signed)
APT. REMINDER CALL, LMTCB °

## 2016-03-24 NOTE — Assessment & Plan Note (Addendum)
BP Readings from Last 3 Encounters:  12/27/15 (!) 145/85  10/21/15 139/85  09/06/15 140/84   SBPs 150s today  Lab Results  Component Value Date   CREATININE 0.42 (L) 12/27/2015   Lab Results  Component Value Date   K 3.6 12/27/2015   Takes stairs at work instead of elevators.  Has not been doing any regular formal exercise.  Current medications: lisinopril 20 mg daily  Assessment BP goal: 130/80 BP control: above goal  Given her overall good health, may benefit from more strict BP goal of 130/80.  Plan Medications:  -increase lisinopril to 40 mg daily Other:  -BMP next visit -Consider 2nd agent if still above goal -Discussed exercise, diet, and weight loss.

## 2016-03-24 NOTE — Progress Notes (Signed)
   CC: "I just want to make sure my blood sugar is still doing well."  HPI:  [name] is a 51 y.o. woman with HTN and DM2 who presents for management of diabetes.  Please see A&P for status of the patient's chronic medical conditions.  Curious about health of dark chocolate.  She is eating 4-5 small pieces about the size of a raisen each 2-3x per week.  Past Medical History:  Diagnosis Date  . Diabetes mellitus   . DUB (dysfunctional uterine bleeding)    total abdominal hysterectomy in 2007, ovaries intact  . Furuncle of multiple sites    multiple I and D's  . GERD (gastroesophageal reflux disease)   . Hydradenitis     Review of Systems:   Review of Systems  Constitutional: Negative for chills and fever.  Respiratory: Negative for cough and shortness of breath.   Cardiovascular: Negative for chest pain and leg swelling.  Gastrointestinal: Negative for constipation and diarrhea.  Genitourinary: Negative for dysuria and hematuria.     Physical Exam:  There were no vitals filed for this visit.   Physical Exam  Constitutional: She is oriented to person, place, and time. She appears well-developed and well-nourished. No distress.  Eyes: Conjunctivae are normal. No scleral icterus.  Cardiovascular: Normal rate, regular rhythm and normal heart sounds.   Pulmonary/Chest: Effort normal and breath sounds normal.  Abdominal: Soft. She exhibits no distension. There is no tenderness.  Musculoskeletal: She exhibits no edema or tenderness.  Neurological: She is alert and oriented to person, place, and time.  Psychiatric: She has a normal mood and affect. Her behavior is normal.    Assessment & Plan:   See Encounters Tab for problem based charting.  Patient discussed with Dr. [place] [name]

## 2016-03-24 NOTE — Assessment & Plan Note (Addendum)
Lab Results  Component Value Date   HGBA1C 6.7 03/27/2016   HGBA1C 6.6 12/27/2015   HGBA1C 6.8 07/06/2015    Recent Labs  03/27/16 1407  GLUCAP 104*    Current medications: metformin 1000 mg BID Current insulin: none  Assessment HgbA1c goal: <7.0 Glycemic control: well controlled Complications: mild bilateral nonproliferative retinopathy  Plan Medications: continue current meds Insulin: none Other:  -discussed diet, weight loss, and exercise -most recent eye exam 12/2015

## 2016-03-27 ENCOUNTER — Ambulatory Visit (INDEPENDENT_AMBULATORY_CARE_PROVIDER_SITE_OTHER): Payer: Medicaid Other | Admitting: Internal Medicine

## 2016-03-27 ENCOUNTER — Encounter (INDEPENDENT_AMBULATORY_CARE_PROVIDER_SITE_OTHER): Payer: Self-pay

## 2016-03-27 DIAGNOSIS — E113293 Type 2 diabetes mellitus with mild nonproliferative diabetic retinopathy without macular edema, bilateral: Secondary | ICD-10-CM | POA: Diagnosis not present

## 2016-03-27 DIAGNOSIS — E11319 Type 2 diabetes mellitus with unspecified diabetic retinopathy without macular edema: Secondary | ICD-10-CM

## 2016-03-27 DIAGNOSIS — Z6831 Body mass index (BMI) 31.0-31.9, adult: Secondary | ICD-10-CM

## 2016-03-27 DIAGNOSIS — E669 Obesity, unspecified: Secondary | ICD-10-CM

## 2016-03-27 DIAGNOSIS — Z7984 Long term (current) use of oral hypoglycemic drugs: Secondary | ICD-10-CM

## 2016-03-27 DIAGNOSIS — I10 Essential (primary) hypertension: Secondary | ICD-10-CM | POA: Diagnosis not present

## 2016-03-27 DIAGNOSIS — Z79899 Other long term (current) drug therapy: Secondary | ICD-10-CM

## 2016-03-27 LAB — POCT GLYCOSYLATED HEMOGLOBIN (HGB A1C): Hemoglobin A1C: 6.7

## 2016-03-27 LAB — GLUCOSE, CAPILLARY: GLUCOSE-CAPILLARY: 104 mg/dL — AB (ref 65–99)

## 2016-03-27 MED ORDER — LISINOPRIL 40 MG PO TABS: 40.0000 mg | ORAL_TABLET | Freq: Every day | ORAL | 3 refills | Status: DC

## 2016-03-27 NOTE — Assessment & Plan Note (Addendum)
  There is no height or weight on file to calculate BMI.   Weight ~200 lbs in 2011, stable ~180 lbs since.  A/P Obestity -discussed contribution of obesity to HTN and DM -encourage addition of regular formal exercise to her routine, starting with at least 30 min moderate intensity exercise 3x per week. -discussed diet, including dark chocolate in moderation as currently described

## 2016-03-27 NOTE — Patient Instructions (Signed)
You diabetes is still doing great - good job!  Try and get in some dedicated exercise 3 or 4 days a week, at least 30 minutes at a time, and strenuous enough that your heart rate gets up a bit and you get slightly out of breath.  For your blood pressure, lets's increase the Lisinopril to 40 mg once a day instead of 20 mg.  You can take 2 of the pills you currently have every day until you run out, and I've sent in a prescription for 40 mg pills after that.

## 2016-03-28 NOTE — Progress Notes (Signed)
Internal Medicine Clinic Attending  Case discussed with Dr. O'Sullivan at the time of the visit.  We reviewed the resident's history and exam and pertinent patient test results.  I agree with the assessment, diagnosis, and plan of care documented in the resident's note. 

## 2016-05-02 ENCOUNTER — Other Ambulatory Visit: Payer: Self-pay | Admitting: Internal Medicine

## 2016-05-02 DIAGNOSIS — I10 Essential (primary) hypertension: Secondary | ICD-10-CM

## 2016-05-04 ENCOUNTER — Ambulatory Visit: Payer: Medicaid Other | Admitting: Podiatry

## 2016-05-04 ENCOUNTER — Ambulatory Visit (INDEPENDENT_AMBULATORY_CARE_PROVIDER_SITE_OTHER): Payer: Medicaid Other | Admitting: Podiatry

## 2016-05-04 DIAGNOSIS — M79676 Pain in unspecified toe(s): Secondary | ICD-10-CM

## 2016-05-04 DIAGNOSIS — B351 Tinea unguium: Secondary | ICD-10-CM | POA: Diagnosis not present

## 2016-05-04 DIAGNOSIS — E1140 Type 2 diabetes mellitus with diabetic neuropathy, unspecified: Secondary | ICD-10-CM

## 2016-05-04 NOTE — Progress Notes (Signed)
Patient ID: Jaime [name], female   DOB: 1965/03/07, 51 y.o.   MRN: [id number] Complaint:  Visit Type: Patient returns to my office for continued preventative foot care services. Complaint: Patient states" my nails have grown long and thick and become painful to walk and wear shoes" Patient has been diagnosed with DM with no foot complications. The patient presents for preventative foot care services. No changes to ROS  Podiatric Exam: Vascular: dorsalis pedis and posterior tibial pulses are palpable bilateral. Capillary return is immediate. Temperature gradient is WNL. Skin turgor WNL  Sensorium: Normal Semmes Weinstein monofilament test. Normal tactile sensation bilaterally. Nail Exam: Pt has thick disfigured discolored nails with subungual debris noted bilateral entire nail hallux through fifth toenails Ulcer Exam: There is no evidence of ulcer or pre-ulcerative changes or infection. Orthopedic Exam: Muscle tone and strength are WNL. No limitations in general ROM. No crepitus or effusions noted. Foot type and digits show no abnormalities. Bony prominences are unremarkable. Skin: No Porokeratosis. No infection or ulcers  Diagnosis:  Onychomycosis, , Pain in right toe, pain in left toes  Treatment & Plan Procedures and Treatment: Consent by patient was obtained for treatment procedures. The patient understood the discussion of treatment and procedures well. All questions were answered thoroughly reviewed. Debridement of mycotic and hypertrophic toenails, 1 through 5 bilateral and clearing of subungual debris. No ulceration, no infection noted.  Return Visit-Office Procedure: Patient instructed to return to the office for a follow up visit 3 months for continued evaluation and treatment.  Gardiner Barefoot DPM

## 2016-06-08 ENCOUNTER — Encounter (HOSPITAL_COMMUNITY): Payer: Self-pay | Admitting: Emergency Medicine

## 2016-06-08 ENCOUNTER — Ambulatory Visit (HOSPITAL_COMMUNITY)
Admission: EM | Admit: 2016-06-08 | Discharge: 2016-06-08 | Disposition: A | Discharge status: Home / Self Care | Payer: Medicaid Other | Attending: Internal Medicine | Admitting: Internal Medicine

## 2016-06-08 DIAGNOSIS — K219 Gastro-esophageal reflux disease without esophagitis: Secondary | ICD-10-CM

## 2016-06-08 DIAGNOSIS — J302 Other seasonal allergic rhinitis: Secondary | ICD-10-CM

## 2016-06-08 DIAGNOSIS — H9202 Otalgia, left ear: Secondary | ICD-10-CM

## 2016-06-08 MED ORDER — MONTELUKAST SODIUM 10 MG PO TABS: 10.0000 mg | ORAL_TABLET | Freq: Every day | ORAL | 2 refills | Status: DC

## 2016-06-08 MED ORDER — LORATADINE 10 MG PO TABS: 10.0000 mg | ORAL_TABLET | Freq: Every day | ORAL | 2 refills | Status: DC

## 2016-06-08 MED ORDER — FLUTICASONE PROPIONATE 50 MCG/ACT NA SUSP: 2.0000 | Freq: Every day | NASAL | 2 refills | Status: DC

## 2016-06-08 MED ORDER — RANITIDINE HCL 300 MG PO TABS: 300.0000 mg | ORAL_TABLET | Freq: Every day | ORAL | 2 refills | Status: DC

## 2016-06-08 NOTE — Discharge Instructions (Signed)
Your otalgia is most likely related to seasonal allergies. I have refilled your Claritin, take one tablet every day for the remainder of the allergy season. I've also prescribed Flonase, due to sprays each nostril every day, and Singulair, take one tablet every night at bedtime.  For reflux like symptoms, I prescribed Zantac, take one tablet every night at bedtime. Follow-up with her regular doctor for further management of this condition.

## 2016-06-08 NOTE — ED Provider Notes (Signed)
CSN: [id number]     Arrival date & time 06/08/16  1320 History   First MD Initiated Contact with Patient 06/08/16 1501     Chief Complaint  Patient presents with  . Otalgia    left   (Consider location/radiation/quality/duration/timing/severity/associated sxs/prior Treatment) The history is provided by the patient.  Otalgia  Location:  Left Behind ear:  No abnormality Quality:  Aching and pressure Severity:  Mild Onset quality:  Gradual Duration:  1 week Timing:  Constant Progression:  Worsening Chronicity:  New Context: not direct blow, not foreign body in ear, not loud noise, not recent URI and not water in ear   Relieved by:  None tried Worsened by:  Position and swallowing Ineffective treatments:  None tried Associated symptoms: congestion and rhinorrhea   Associated symptoms: no abdominal pain, no cough, no diarrhea, no ear discharge, no fever, no headaches, no hearing loss, no neck pain, no rash, no sore throat, no tinnitus and no vomiting     Past Medical History:  Diagnosis Date  . Diabetes mellitus   . DUB (dysfunctional uterine bleeding)    total abdominal hysterectomy in 2007, ovaries intact  . Furuncle of multiple sites    multiple I and D's  . GERD (gastroesophageal reflux disease)   . Hydradenitis    Past Surgical History:  Procedure Laterality Date  . ABDOMINAL HYSTERECTOMY    . HYDRADENITIS EXCISION    . TONSILLECTOMY AND ADENOIDECTOMY     in early childhood   Family History  Problem Relation Age of Onset  . Cancer Mother 33       Breast cancer  . Hypertension Mother   . Depression Mother   . Sickle cell trait Mother   . Cancer Father 77       lung cancer, was a long term smoker   Social History  Substance Use Topics  . Smoking status: Never Smoker  . Smokeless tobacco: Never Used  . Alcohol use No   OB History    No data available     Review of Systems  Constitutional: Negative for chills and fever.  HENT: Positive for congestion,  ear pain and rhinorrhea. Negative for ear discharge, hearing loss, sore throat and tinnitus.   Eyes: Positive for redness and itching. Negative for visual disturbance.  Respiratory: Negative for cough and shortness of breath.   Cardiovascular: Negative for chest pain and palpitations.  Gastrointestinal: Negative for abdominal pain, diarrhea, nausea and vomiting.  Musculoskeletal: Negative for neck pain and neck stiffness.  Skin: Negative for color change and rash.  Neurological: Negative for light-headedness and headaches.    Allergies  Patient has no known allergies.  Home Medications   Prior to Admission medications   Medication Sig Start Date End Date Taking? Authorizing Provider  pravastatin (PRAVACHOL) 20 MG tablet TAKE 1 TABLET BY MOUTH EVERY EVENING 03/03/16  Yes Minus Liberty, MD  fluticasone [facility]) 50 MCG/ACT nasal spray Place 2 sprays into both nostrils daily. 06/08/16   Barnet Glasgow, NP  lisinopril (PRINIVIL,ZESTRIL) 40 MG tablet Take 1 tablet (40 mg total) by mouth daily. 03/27/16 04/26/16  Minus Liberty, MD  loratadine (CLARITIN) 10 MG tablet Take 1 tablet (10 mg total) by mouth daily. 06/08/16   Barnet Glasgow, NP  metFORMIN (GLUCOPHAGE) 1000 MG tablet Take 1 tablet (1,000 mg total) by mouth 2 (two) times daily with a meal. 12/27/15 03/27/16  Minus Liberty, MD  montelukast (SINGULAIR) 10 MG tablet Take 1 tablet (10 mg total) by mouth at bedtime.  06/08/16   Barnet Glasgow, NP  ranitidine (ZANTAC) 300 MG tablet Take 1 tablet (300 mg total) by mouth at bedtime. 06/08/16   Barnet Glasgow, NP   Meds Ordered and Administered this Visit  Medications - No data to display  BP 122/74 (BP Location: Left Arm)   Pulse (!) 109   Temp 98.6 F (37 C) (Oral)   LMP 10/31/2005   SpO2 100%  No data found.   Physical Exam  Constitutional: She is oriented to person, place, and time. She appears well-developed and well-nourished. No distress.  HENT:  Head:  Normocephalic and atraumatic.  Right Ear: Tympanic membrane and external ear normal.  Left Ear: External ear normal. Tympanic membrane is bulging. Tympanic membrane is not injected and not erythematous.  Nose: Mucosal edema present.  Mouth/Throat: Oropharynx is clear and moist.  Eyes: Conjunctivae and EOM are normal. Pupils are equal, round, and reactive to light.  Neck: Normal range of motion.  Cardiovascular: Normal rate and regular rhythm.   Pulmonary/Chest: Effort normal and breath sounds normal.  Lymphadenopathy:       Head (right side): Submandibular adenopathy present.       Head (left side): Submandibular adenopathy present.    She has cervical adenopathy.  Neurological: She is alert and oriented to person, place, and time.  Skin: Skin is warm and dry. Capillary refill takes less than 2 seconds. She is not diaphoretic.  Psychiatric: She has a normal mood and affect. Her behavior is normal.  Nursing note and vitals reviewed.   Urgent Care Course     Procedures (including critical care time)  Labs Review Labs Reviewed - No data to display  Imaging Review No results found.      MDM   1. Seasonal allergic rhinitis, unspecified trigger   2. Otalgia of left ear   3. Gastroesophageal reflux disease, esophagitis presence not specified    Refilled prescription for Claritin, started on Flonase, Singulair for allergies. Provided counseling on the use of these medications along with symptomatic management.  Also has complaints related to reflux, started her on Zantac 1 tablet daily at bedtime, follow-up with primary care for management of this condition.    Barnet Glasgow, NP 06/08/16 [phone]

## 2016-06-08 NOTE — ED Triage Notes (Signed)
Pt reports left ear pain along with head pain from her ear to her forehead.  She also reports feeling like something is moving in her ear.

## 2016-06-10 ENCOUNTER — Encounter (HOSPITAL_COMMUNITY): Payer: Self-pay | Admitting: Emergency Medicine

## 2016-06-10 ENCOUNTER — Ambulatory Visit (HOSPITAL_COMMUNITY)
Admission: EM | Admit: 2016-06-10 | Discharge: 2016-06-10 | Disposition: A | Discharge status: Home / Self Care | Payer: Medicaid Other | Attending: Internal Medicine | Admitting: Internal Medicine

## 2016-06-10 DIAGNOSIS — E860 Dehydration: Secondary | ICD-10-CM

## 2016-06-10 LAB — POCT I-STAT, CHEM 8
BUN: 5 mg/dL — ABNORMAL LOW (ref 6–20)
Calcium, Ion: 1.23 mmol/L (ref 1.15–1.40)
Chloride: 97 mmol/L — ABNORMAL LOW (ref 101–111)
Creatinine, Ser: 0.5 mg/dL (ref 0.44–1.00)
Glucose, Bld: 121 mg/dL — ABNORMAL HIGH (ref 65–99)
HCT: 36 % (ref 36.0–46.0)
Hemoglobin: 12.2 g/dL (ref 12.0–15.0)
Potassium: 3.4 mmol/L — ABNORMAL LOW (ref 3.5–5.1)
SODIUM: 141 mmol/L (ref 135–145)
TCO2: 34 mmol/L (ref 0–100)

## 2016-06-10 MED ORDER — PREDNISONE 50 MG PO TABS: 50.0000 mg | ORAL_TABLET | Freq: Every day | ORAL | 0 refills | Status: DC

## 2016-06-10 MED ORDER — SODIUM CHLORIDE 0.9 % IV BOLUS (SEPSIS)
1000.0000 mL | Freq: Once | INTRAVENOUS | Status: AC
  Administered 2016-06-10: 1000 mL via INTRAVENOUS

## 2016-06-10 NOTE — Discharge Instructions (Addendum)
Push fluids and rest.  Continue ranitidine (for stomach acid), nasal steroid (fluticasone, for congestion), and loratadine (for allergy symptoms).  Prescription for prednisone sent to the pharmacy (for congestion).  Recheck as needed.

## 2016-06-10 NOTE — ED Provider Notes (Signed)
[address]    CSN: [id number] Arrival date & time: 06/10/16  1301     History   Chief Complaint Chief Complaint  Patient presents with  . Emesis    HPI Jaime [name] is a 51 y.o. female. She was seen 2 days ago at the urgent care with left earache, and some reflux symptoms. Still has a bad left earache. Vomited this morning, and thought she saw an insect in the vomitus, as well as a streak of blood. Very nervous about the possibility of insects living inside her head.    HPI  Past Medical History:  Diagnosis Date  . Diabetes mellitus   . DUB (dysfunctional uterine bleeding)    total abdominal hysterectomy in 2007, ovaries intact  . Furuncle of multiple sites    multiple I and D's  . GERD (gastroesophageal reflux disease)   . Hydradenitis     Patient Active Problem List   Diagnosis Date Noted  . 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 Onychomycosis of left great toe 01/13/2014  . Healthcare maintenance 09/23/2013  . Hypertension 06/11/2013  . Diabetes mellitus type 2 with retinopathy ([place]) 11/30/2009  . Obesity 09/28/2009  . SICKLE-CELL TRAIT 09/28/2009    Past Surgical History:  Procedure Laterality Date  . ABDOMINAL HYSTERECTOMY    . HYDRADENITIS EXCISION    . TONSILLECTOMY AND ADENOIDECTOMY     in early childhood     Home Medications    Prior to Admission medications   Medication Sig Start Date End Date Taking? Authorizing Provider  fluticasone (FLONASE) 50 MCG/ACT nasal spray Place 2 sprays into both nostrils daily. 06/08/16  Yes Barnet Glasgow, NP  loratadine (CLARITIN) 10 MG tablet Take 1 tablet (10 mg total) by mouth daily. 06/08/16  Yes Barnet Glasgow, NP  montelukast (SINGULAIR) 10 MG tablet Take 1 tablet (10 mg total) by mouth at bedtime. 06/08/16  Yes Barnet Glasgow, NP  pravastatin (PRAVACHOL) 20 MG tablet TAKE 1 TABLET BY MOUTH EVERY EVENING 03/03/16  Yes Minus Liberty, MD  ranitidine (ZANTAC) 300 MG  tablet Take 1 tablet (300 mg total) by mouth at bedtime. 06/08/16  Yes Barnet Glasgow, NP  lisinopril (PRINIVIL,ZESTRIL) 40 MG tablet Take 1 tablet (40 mg total) by mouth daily. 03/27/16 04/26/16  Minus Liberty, MD  metFORMIN (GLUCOPHAGE) 1000 MG tablet Take 1 tablet (1,000 mg total) by mouth 2 (two) times daily with a meal. 12/27/15 03/27/16  Minus Liberty, MD    Family History Family History  Problem Relation Age of Onset  . Cancer Mother 76       Breast cancer  . Hypertension Mother   . Depression Mother   . Sickle cell trait Mother   . Cancer Father 50       lung cancer, was a long term smoker    Social History Social History  Substance Use Topics  . Smoking status: Never Smoker  . Smokeless tobacco: Never Used  . Alcohol use No     Allergies   Patient has no known allergies.   Review of Systems Review of Systems  All other systems reviewed and are negative.    Physical Exam Triage Vital Signs ED Triage Vitals  Enc Vitals Group     BP 06/10/16 1317 115/64     Pulse Rate 06/10/16 1317 (!) 106     Resp 06/10/16 1317 20     Temp 06/10/16 1317 98.6 F (37 C)     Temp Source 06/10/16 1317 Oral  SpO2 06/10/16 1317 99 %     Weight --      Height --      Pain Score 06/10/16 1318 5     Pain Loc --    Orthostatic VS for the past 24 hrs:  BP- Lying Pulse- Lying BP- Sitting Pulse- Sitting BP- Standing at 0 minutes Pulse- Standing at 0 minutes  06/10/16 1431 136/87 84 131/90 108 124/85 112    Updated Vital Signs BP 115/64 (BP Location: Left Arm)   Pulse (!) 106   Temp 98.6 F (37 C) (Oral)   Resp 20   LMP 10/31/2005   SpO2 99%      Physical Exam  Constitutional: She is oriented to person, place, and time. No distress.  HENT:  Head: Atraumatic.  Bilateral TMs dull, left greater than right, no erythema Moderately severe nasal congestion bilaterally Throat difficult to visualize, appears to be slightly injected  Eyes:  Conjugate gaze  observed, no eye redness/discharge  Neck: Neck supple.  Cardiovascular: Regular rhythm.   Heart rate 100s  Pulmonary/Chest: No respiratory distress. She has no wheezes. She has no rales.  Lungs clear, symmetric breath sounds  Abdominal: She exhibits no distension.  Musculoskeletal: Normal range of motion.  Neurological: She is alert and oriented to person, place, and time.  Skin: Skin is warm and dry.  Nursing note and vitals reviewed.    UC Treatments / Results  Labs Results for orders placed or performed during the hospital encounter of 06/10/16  I-STAT, chem 8  Result Value Ref Range   Sodium 141 135 - 145 mmol/L   Potassium 3.4 (L) 3.5 - 5.1 mmol/L   Chloride 97 (L) 101 - 111 mmol/L   BUN 5 (L) 6 - 20 mg/dL   Creatinine, Ser 0.50 0.44 - 1.00 mg/dL   Glucose, Bld 121 (H) 65 - 99 mg/dL   Calcium, Ion 1.23 1.15 - 1.40 mmol/L   TCO2 34 0 - 100 mmol/L   Hemoglobin 12.2 12.0 - 15.0 g/dL   HCT 36.0 36.0 - 46.0 %     Procedures Procedures (including critical care time)  Medications Ordered in UC Medications  sodium chloride 0.9 % bolus 1,000 mL (1,000 mLs Intravenous New Bag/Given 06/10/16 1507)    Improvement in headache and well-being after ivf given  Final Clinical Impressions(s) / UC Diagnoses   Final diagnoses:  Dehydration, mild   Push fluids and rest.  Continue ranitidine (for stomach acid), nasal steroid (fluticasone, for congestion), and loratadine (for allergy symptoms).  Prescription for prednisone sent to the pharmacy (for congestion).  Recheck as needed.     Sherlene Shams, MD 06/13/16 [phone]

## 2016-06-10 NOTE — ED Triage Notes (Signed)
Pt here for vomiting since this am... Reports she noticed blood in her vomit and also reports she was able to see a bug come out "that was in her head"  Seen yest at [facility] for HA  A&O x4... NAD

## 2016-06-17 ENCOUNTER — Inpatient Hospital Stay (HOSPITAL_COMMUNITY)
Admission: EM | Admit: 2016-06-17 | Discharge: 2016-06-22 | DRG: 054 | Disposition: A | Discharge status: Home Health | Payer: Medicaid Other | Attending: Oncology | Admitting: Oncology

## 2016-06-17 ENCOUNTER — Encounter (HOSPITAL_COMMUNITY): Payer: Self-pay | Admitting: Emergency Medicine

## 2016-06-17 DIAGNOSIS — E1165 Type 2 diabetes mellitus with hyperglycemia: Secondary | ICD-10-CM | POA: Diagnosis present

## 2016-06-17 DIAGNOSIS — Z8249 Family history of ischemic heart disease and other diseases of the circulatory system: Secondary | ICD-10-CM

## 2016-06-17 DIAGNOSIS — E099 Drug or chemical induced diabetes mellitus without complications: Secondary | ICD-10-CM

## 2016-06-17 DIAGNOSIS — B3789 Other sites of candidiasis: Secondary | ICD-10-CM | POA: Diagnosis present

## 2016-06-17 DIAGNOSIS — Z818 Family history of other mental and behavioral disorders: Secondary | ICD-10-CM

## 2016-06-17 DIAGNOSIS — Z833 Family history of diabetes mellitus: Secondary | ICD-10-CM

## 2016-06-17 DIAGNOSIS — E11319 Type 2 diabetes mellitus with unspecified diabetic retinopathy without macular edema: Secondary | ICD-10-CM | POA: Diagnosis present

## 2016-06-17 DIAGNOSIS — J028 Acute pharyngitis due to other specified organisms: Secondary | ICD-10-CM | POA: Diagnosis present

## 2016-06-17 DIAGNOSIS — Z79899 Other long term (current) drug therapy: Secondary | ICD-10-CM

## 2016-06-17 DIAGNOSIS — I10 Essential (primary) hypertension: Secondary | ICD-10-CM | POA: Diagnosis present

## 2016-06-17 DIAGNOSIS — R51 Headache: Secondary | ICD-10-CM

## 2016-06-17 DIAGNOSIS — E785 Hyperlipidemia, unspecified: Secondary | ICD-10-CM | POA: Diagnosis present

## 2016-06-17 DIAGNOSIS — F840 Autistic disorder: Secondary | ICD-10-CM | POA: Diagnosis present

## 2016-06-17 DIAGNOSIS — F40240 Claustrophobia: Secondary | ICD-10-CM | POA: Diagnosis present

## 2016-06-17 DIAGNOSIS — C712 Malignant neoplasm of temporal lobe: Principal | ICD-10-CM | POA: Diagnosis present

## 2016-06-17 DIAGNOSIS — R519 Headache, unspecified: Secondary | ICD-10-CM

## 2016-06-17 DIAGNOSIS — G936 Cerebral edema: Secondary | ICD-10-CM | POA: Diagnosis present

## 2016-06-17 DIAGNOSIS — E113299 Type 2 diabetes mellitus with mild nonproliferative diabetic retinopathy without macular edema, unspecified eye: Secondary | ICD-10-CM | POA: Diagnosis present

## 2016-06-17 DIAGNOSIS — Z801 Family history of malignant neoplasm of trachea, bronchus and lung: Secondary | ICD-10-CM

## 2016-06-17 DIAGNOSIS — D496 Neoplasm of unspecified behavior of brain: Secondary | ICD-10-CM

## 2016-06-17 DIAGNOSIS — T380X5A Adverse effect of glucocorticoids and synthetic analogues, initial encounter: Secondary | ICD-10-CM | POA: Diagnosis present

## 2016-06-17 DIAGNOSIS — Z90710 Acquired absence of both cervix and uterus: Secondary | ICD-10-CM

## 2016-06-17 DIAGNOSIS — R112 Nausea with vomiting, unspecified: Secondary | ICD-10-CM | POA: Diagnosis present

## 2016-06-17 DIAGNOSIS — E876 Hypokalemia: Secondary | ICD-10-CM | POA: Diagnosis present

## 2016-06-17 DIAGNOSIS — B370 Candidal stomatitis: Secondary | ICD-10-CM

## 2016-06-17 DIAGNOSIS — Z515 Encounter for palliative care: Secondary | ICD-10-CM

## 2016-06-17 DIAGNOSIS — Z7984 Long term (current) use of oral hypoglycemic drugs: Secondary | ICD-10-CM

## 2016-06-17 DIAGNOSIS — K219 Gastro-esophageal reflux disease without esophagitis: Secondary | ICD-10-CM | POA: Diagnosis present

## 2016-06-17 DIAGNOSIS — F79 Unspecified intellectual disabilities: Secondary | ICD-10-CM | POA: Diagnosis present

## 2016-06-17 HISTORY — DX: Adverse effect of glucocorticoids and synthetic analogues, initial encounter: T38.0X5A

## 2016-06-17 HISTORY — DX: Drug or chemical induced diabetes mellitus without complications: E09.9

## 2016-06-17 HISTORY — DX: Candidal stomatitis: B37.0

## 2016-06-17 HISTORY — DX: Unspecified intellectual disabilities: F79

## 2016-06-17 LAB — COMPREHENSIVE METABOLIC PANEL
ALBUMIN: 3.9 g/dL (ref 3.5–5.0)
ALT: 9 U/L — ABNORMAL LOW (ref 14–54)
ANION GAP: 11 (ref 5–15)
AST: 15 U/L (ref 15–41)
Alkaline Phosphatase: 50 U/L (ref 38–126)
BUN: <5 mg/dL — ABNORMAL LOW (ref 6–20)
CALCIUM: 9.4 mg/dL (ref 8.9–10.3)
CO2: 32 mmol/L (ref 22–32)
CREATININE: 0.5 mg/dL (ref 0.44–1.00)
Chloride: 95 mmol/L — ABNORMAL LOW (ref 101–111)
GFR calc Af Amer: >60 mL/min (ref >60)
GFR calc non Af Amer: >60 mL/min (ref >60)
GLUCOSE: 159 mg/dL — AB (ref 65–99)
POTASSIUM: 2.8 mmol/L — AB (ref 3.5–5.1)
SODIUM: 138 mmol/L (ref 135–145)
TOTAL PROTEIN: 7.3 g/dL (ref 6.5–8.1)
Total Bilirubin: 0.5 mg/dL (ref 0.3–1.2)

## 2016-06-17 LAB — URINALYSIS, ROUTINE W REFLEX MICROSCOPIC
Bilirubin Urine: NEGATIVE
Glucose, UA: NEGATIVE mg/dL
HGB URINE DIPSTICK: NEGATIVE
Ketones, ur: NEGATIVE mg/dL
NITRITE: NEGATIVE
PH: 6 (ref 5.0–8.0)
Protein, ur: NEGATIVE mg/dL
SPECIFIC GRAVITY, URINE: 1.01 (ref 1.005–1.030)

## 2016-06-17 LAB — CBC
HCT: 33 % — ABNORMAL LOW (ref 36.0–46.0)
Hemoglobin: 10.8 g/dL — ABNORMAL LOW (ref 12.0–15.0)
MCH: 27.4 pg (ref 26.0–34.0)
MCHC: 32.7 g/dL (ref 30.0–36.0)
MCV: 83.8 fL (ref 78.0–100.0)
PLATELETS: 306 10*3/uL (ref 150–400)
RBC: 3.94 MIL/uL (ref 3.87–5.11)
RDW: 13.6 % (ref 11.5–15.5)
WBC: 6.7 10*3/uL (ref 4.0–10.5)

## 2016-06-17 NOTE — ED Triage Notes (Signed)
Pt reports that she "is not getting better" has had a headache for about a week, throbbing and "feeling like something is crawling around in my head".  Has vomited twice and reports diarrhea.

## 2016-06-18 ENCOUNTER — Emergency Department (HOSPITAL_COMMUNITY): Payer: Medicaid Other

## 2016-06-18 ENCOUNTER — Encounter (HOSPITAL_COMMUNITY): Payer: Self-pay | Admitting: Emergency Medicine

## 2016-06-18 DIAGNOSIS — T380X5A Adverse effect of glucocorticoids and synthetic analogues, initial encounter: Secondary | ICD-10-CM | POA: Diagnosis present

## 2016-06-18 DIAGNOSIS — K219 Gastro-esophageal reflux disease without esophagitis: Secondary | ICD-10-CM | POA: Diagnosis present

## 2016-06-18 DIAGNOSIS — E119 Type 2 diabetes mellitus without complications: Secondary | ICD-10-CM | POA: Diagnosis not present

## 2016-06-18 DIAGNOSIS — I10 Essential (primary) hypertension: Secondary | ICD-10-CM | POA: Diagnosis present

## 2016-06-18 DIAGNOSIS — D496 Neoplasm of unspecified behavior of brain: Secondary | ICD-10-CM

## 2016-06-18 DIAGNOSIS — B3789 Other sites of candidiasis: Secondary | ICD-10-CM | POA: Diagnosis present

## 2016-06-18 DIAGNOSIS — R51 Headache: Secondary | ICD-10-CM

## 2016-06-18 DIAGNOSIS — G936 Cerebral edema: Secondary | ICD-10-CM | POA: Diagnosis present

## 2016-06-18 DIAGNOSIS — Z818 Family history of other mental and behavioral disorders: Secondary | ICD-10-CM | POA: Diagnosis not present

## 2016-06-18 DIAGNOSIS — F79 Unspecified intellectual disabilities: Secondary | ICD-10-CM | POA: Diagnosis present

## 2016-06-18 DIAGNOSIS — F840 Autistic disorder: Secondary | ICD-10-CM | POA: Diagnosis present

## 2016-06-18 DIAGNOSIS — Z79899 Other long term (current) drug therapy: Secondary | ICD-10-CM | POA: Diagnosis not present

## 2016-06-18 DIAGNOSIS — G44221 Chronic tension-type headache, intractable: Secondary | ICD-10-CM | POA: Diagnosis not present

## 2016-06-18 DIAGNOSIS — Z7984 Long term (current) use of oral hypoglycemic drugs: Secondary | ICD-10-CM | POA: Diagnosis not present

## 2016-06-18 DIAGNOSIS — C712 Malignant neoplasm of temporal lobe: Secondary | ICD-10-CM | POA: Diagnosis not present

## 2016-06-18 DIAGNOSIS — G939 Disorder of brain, unspecified: Secondary | ICD-10-CM | POA: Diagnosis not present

## 2016-06-18 DIAGNOSIS — J028 Acute pharyngitis due to other specified organisms: Secondary | ICD-10-CM | POA: Diagnosis present

## 2016-06-18 DIAGNOSIS — G9389 Other specified disorders of brain: Secondary | ICD-10-CM | POA: Insufficient documentation

## 2016-06-18 DIAGNOSIS — R112 Nausea with vomiting, unspecified: Secondary | ICD-10-CM | POA: Diagnosis present

## 2016-06-18 DIAGNOSIS — E11319 Type 2 diabetes mellitus with unspecified diabetic retinopathy without macular edema: Secondary | ICD-10-CM | POA: Diagnosis not present

## 2016-06-18 DIAGNOSIS — Z801 Family history of malignant neoplasm of trachea, bronchus and lung: Secondary | ICD-10-CM | POA: Diagnosis not present

## 2016-06-18 DIAGNOSIS — Z7189 Other specified counseling: Secondary | ICD-10-CM | POA: Diagnosis not present

## 2016-06-18 DIAGNOSIS — R519 Headache, unspecified: Secondary | ICD-10-CM

## 2016-06-18 DIAGNOSIS — Z515 Encounter for palliative care: Secondary | ICD-10-CM | POA: Diagnosis not present

## 2016-06-18 DIAGNOSIS — Z90710 Acquired absence of both cervix and uterus: Secondary | ICD-10-CM | POA: Diagnosis not present

## 2016-06-18 DIAGNOSIS — Z794 Long term (current) use of insulin: Secondary | ICD-10-CM | POA: Diagnosis not present

## 2016-06-18 DIAGNOSIS — E876 Hypokalemia: Secondary | ICD-10-CM | POA: Diagnosis present

## 2016-06-18 DIAGNOSIS — C719 Malignant neoplasm of brain, unspecified: Secondary | ICD-10-CM | POA: Diagnosis not present

## 2016-06-18 DIAGNOSIS — E1165 Type 2 diabetes mellitus with hyperglycemia: Secondary | ICD-10-CM | POA: Diagnosis present

## 2016-06-18 DIAGNOSIS — E785 Hyperlipidemia, unspecified: Secondary | ICD-10-CM | POA: Diagnosis present

## 2016-06-18 DIAGNOSIS — F40240 Claustrophobia: Secondary | ICD-10-CM | POA: Diagnosis present

## 2016-06-18 DIAGNOSIS — E113299 Type 2 diabetes mellitus with mild nonproliferative diabetic retinopathy without macular edema, unspecified eye: Secondary | ICD-10-CM | POA: Diagnosis present

## 2016-06-18 DIAGNOSIS — Z833 Family history of diabetes mellitus: Secondary | ICD-10-CM | POA: Diagnosis not present

## 2016-06-18 DIAGNOSIS — Z803 Family history of malignant neoplasm of breast: Secondary | ICD-10-CM | POA: Diagnosis not present

## 2016-06-18 DIAGNOSIS — Z8249 Family history of ischemic heart disease and other diseases of the circulatory system: Secondary | ICD-10-CM | POA: Diagnosis not present

## 2016-06-18 DIAGNOSIS — D432 Neoplasm of uncertain behavior of brain, unspecified: Secondary | ICD-10-CM | POA: Diagnosis not present

## 2016-06-18 LAB — BASIC METABOLIC PANEL
ANION GAP: 9 (ref 5–15)
Anion gap: 9 (ref 5–15)
BUN: <5 mg/dL — ABNORMAL LOW (ref 6–20)
BUN: 8 mg/dL (ref 6–20)
CALCIUM: 9.1 mg/dL (ref 8.9–10.3)
CHLORIDE: 102 mmol/L (ref 101–111)
CO2: 25 mmol/L (ref 22–32)
CO2: 27 mmol/L (ref 22–32)
CREATININE: 0.54 mg/dL (ref 0.44–1.00)
Calcium: 8.8 mg/dL — ABNORMAL LOW (ref 8.9–10.3)
Chloride: 103 mmol/L (ref 101–111)
Creatinine, Ser: 0.45 mg/dL (ref 0.44–1.00)
GFR calc non Af Amer: >60 mL/min (ref >60)
GFR calc non Af Amer: >60 mL/min (ref >60)
Glucose, Bld: 212 mg/dL — ABNORMAL HIGH (ref 65–99)
Glucose, Bld: 219 mg/dL — ABNORMAL HIGH (ref 65–99)
POTASSIUM: 3.6 mmol/L (ref 3.5–5.1)
POTASSIUM: 3.8 mmol/L (ref 3.5–5.1)
SODIUM: 136 mmol/L (ref 135–145)
SODIUM: 139 mmol/L (ref 135–145)

## 2016-06-18 LAB — APTT: aPTT: 28 seconds (ref 24–36)

## 2016-06-18 LAB — RAPID URINE DRUG SCREEN, HOSP PERFORMED
AMPHETAMINES: NOT DETECTED
Barbiturates: NOT DETECTED
Benzodiazepines: NOT DETECTED
COCAINE: NOT DETECTED
OPIATES: NOT DETECTED
Tetrahydrocannabinol: NOT DETECTED

## 2016-06-18 LAB — GLUCOSE, CAPILLARY
GLUCOSE-CAPILLARY: 147 mg/dL — AB (ref 65–99)
GLUCOSE-CAPILLARY: 212 mg/dL — AB (ref 65–99)
GLUCOSE-CAPILLARY: 221 mg/dL — AB (ref 65–99)
GLUCOSE-CAPILLARY: 231 mg/dL — AB (ref 65–99)
Glucose-Capillary: 203 mg/dL — ABNORMAL HIGH (ref 65–99)

## 2016-06-18 LAB — PROTIME-INR
INR: 1.12
Prothrombin Time: 14.4 seconds (ref 11.4–15.2)

## 2016-06-18 LAB — ABO/RH: ABO/RH(D): A POS

## 2016-06-18 LAB — TYPE AND SCREEN
ABO/RH(D): A POS
ANTIBODY SCREEN: NEGATIVE

## 2016-06-18 LAB — HIV ANTIBODY (ROUTINE TESTING W REFLEX): HIV Screen 4th Generation wRfx: NONREACTIVE

## 2016-06-18 LAB — MRSA PCR SCREENING: MRSA BY PCR: NEGATIVE

## 2016-06-18 LAB — MAGNESIUM: Magnesium: 1.4 mg/dL — ABNORMAL LOW (ref 1.7–2.4)

## 2016-06-18 MED ORDER — INSULIN ASPART 100 UNIT/ML ~~LOC~~ SOLN
0.0000 [IU] | SUBCUTANEOUS | Status: DC
  Administered 2016-06-18 (×2): 5 [IU] via SUBCUTANEOUS
  Administered 2016-06-18: 2 [IU] via SUBCUTANEOUS
  Administered 2016-06-19: 5 [IU] via SUBCUTANEOUS
  Administered 2016-06-19: 8 [IU] via SUBCUTANEOUS
  Administered 2016-06-19: 3 [IU] via SUBCUTANEOUS
  Administered 2016-06-19: 5 [IU] via SUBCUTANEOUS
  Administered 2016-06-19 (×2): 3 [IU] via SUBCUTANEOUS
  Administered 2016-06-19: 5 [IU] via SUBCUTANEOUS
  Administered 2016-06-20 (×2): 3 [IU] via SUBCUTANEOUS
  Administered 2016-06-20: 5 [IU] via SUBCUTANEOUS
  Administered 2016-06-20: 8 [IU] via SUBCUTANEOUS
  Administered 2016-06-20: 5 [IU] via SUBCUTANEOUS
  Administered 2016-06-21: 8 [IU] via SUBCUTANEOUS
  Administered 2016-06-21: 5 [IU] via SUBCUTANEOUS
  Administered 2016-06-21: 8 [IU] via SUBCUTANEOUS
  Administered 2016-06-21: 5 [IU] via SUBCUTANEOUS
  Administered 2016-06-21: 8 [IU] via SUBCUTANEOUS
  Administered 2016-06-21: 3 [IU] via SUBCUTANEOUS
  Administered 2016-06-22 (×2): 5 [IU] via SUBCUTANEOUS
  Administered 2016-06-22: 3 [IU] via SUBCUTANEOUS
  Administered 2016-06-22: 5 [IU] via SUBCUTANEOUS
  Administered 2016-06-22: 2 [IU] via SUBCUTANEOUS

## 2016-06-18 MED ORDER — FAMOTIDINE IN NACL 20-0.9 MG/50ML-% IV SOLN
20.0000 mg | Freq: Two times a day (BID) | INTRAVENOUS | Status: DC
  Administered 2016-06-18 – 2016-06-19 (×4): 20 mg via INTRAVENOUS
  Filled 2016-06-18 (×5): qty 50

## 2016-06-18 MED ORDER — MAGNESIUM SULFATE IN D5W 1-5 GM/100ML-% IV SOLN
1.0000 g | Freq: Once | INTRAVENOUS | Status: AC
  Administered 2016-06-18: 1 g via INTRAVENOUS
  Filled 2016-06-18: qty 100

## 2016-06-18 MED ORDER — PRAVASTATIN SODIUM 20 MG PO TABS
20.0000 mg | ORAL_TABLET | Freq: Every evening | ORAL | Status: DC
  Administered 2016-06-18 – 2016-06-22 (×5): 20 mg via ORAL
  Filled 2016-06-18 (×6): qty 1

## 2016-06-18 MED ORDER — POTASSIUM CHLORIDE 20 MEQ/15ML (10%) PO SOLN: 40.0000 meq | Freq: Once | ORAL | Status: DC

## 2016-06-18 MED ORDER — SODIUM CHLORIDE 0.9 % IV BOLUS (SEPSIS)
1000.0000 mL | Freq: Once | INTRAVENOUS | Status: AC
  Administered 2016-06-18: 1000 mL via INTRAVENOUS

## 2016-06-18 MED ORDER — DEXAMETHASONE SODIUM PHOSPHATE 10 MG/ML IJ SOLN
10.0000 mg | Freq: Four times a day (QID) | INTRAMUSCULAR | Status: DC
  Administered 2016-06-18 – 2016-06-19 (×6): 10 mg via INTRAVENOUS
  Filled 2016-06-18 (×6): qty 1

## 2016-06-18 MED ORDER — HYDRALAZINE HCL 20 MG/ML IJ SOLN: 5.0000 mg | Freq: Four times a day (QID) | INTRAMUSCULAR | Status: DC | PRN

## 2016-06-18 MED ORDER — MAGNESIUM SULFATE 4 GM/100ML IV SOLN
4.0000 g | Freq: Once | INTRAVENOUS | Status: AC
  Administered 2016-06-18: 4 g via INTRAVENOUS
  Filled 2016-06-18: qty 100

## 2016-06-18 MED ORDER — HYDROCODONE-ACETAMINOPHEN 5-325 MG PO TABS
1.0000 | ORAL_TABLET | Freq: Four times a day (QID) | ORAL | Status: DC | PRN
  Administered 2016-06-18 – 2016-06-21 (×3): 1 via ORAL
  Filled 2016-06-18 (×3): qty 1

## 2016-06-18 MED ORDER — INSULIN ASPART 100 UNIT/ML ~~LOC~~ SOLN
2.0000 [IU] | SUBCUTANEOUS | Status: DC
  Administered 2016-06-18: 6 [IU] via SUBCUTANEOUS

## 2016-06-18 MED ORDER — HYDRALAZINE HCL 20 MG/ML IJ SOLN: 5.0000 mg | INTRAMUSCULAR | Status: DC | PRN

## 2016-06-18 MED ORDER — SODIUM CHLORIDE 0.9 % IV SOLN: 250.0000 mL | INTRAVENOUS | Status: DC | PRN

## 2016-06-18 MED ORDER — HEPARIN SODIUM (PORCINE) 5000 UNIT/ML IJ SOLN
5000.0000 [IU] | Freq: Three times a day (TID) | INTRAMUSCULAR | Status: DC
  Administered 2016-06-18 – 2016-06-22 (×13): 5000 [IU] via SUBCUTANEOUS
  Filled 2016-06-18 (×13): qty 1

## 2016-06-18 MED ORDER — ONDANSETRON HCL 4 MG/2ML IJ SOLN: 4.0000 mg | Freq: Three times a day (TID) | INTRAMUSCULAR | Status: DC | PRN

## 2016-06-18 MED ORDER — FLUTICASONE PROPIONATE 50 MCG/ACT NA SUSP
2.0000 | Freq: Every day | NASAL | Status: DC
  Administered 2016-06-18 – 2016-06-22 (×5): 2 via NASAL
  Filled 2016-06-18: qty 16

## 2016-06-18 MED ORDER — DIPHENHYDRAMINE HCL 50 MG/ML IJ SOLN
25.0000 mg | Freq: Once | INTRAMUSCULAR | Status: AC
  Administered 2016-06-18: 25 mg via INTRAVENOUS
  Filled 2016-06-18: qty 1

## 2016-06-18 MED ORDER — SODIUM CHLORIDE 0.9 % IV SOLN
INTRAVENOUS | Status: DC
  Administered 2016-06-18 – 2016-06-19 (×2): via INTRAVENOUS

## 2016-06-18 MED ORDER — PROCHLORPERAZINE EDISYLATE 5 MG/ML IJ SOLN
10.0000 mg | Freq: Once | INTRAMUSCULAR | Status: AC
  Administered 2016-06-18: 10 mg via INTRAVENOUS
  Filled 2016-06-18: qty 2

## 2016-06-18 MED ORDER — POTASSIUM CHLORIDE 10 MEQ/100ML IV SOLN
10.0000 meq | Freq: Once | INTRAVENOUS | Status: AC
Start: 2016-06-18 — End: 2016-06-18
  Administered 2016-06-18: 10 meq via INTRAVENOUS
  Filled 2016-06-18: qty 100

## 2016-06-18 MED ORDER — DEXAMETHASONE SODIUM PHOSPHATE 10 MG/ML IJ SOLN
10.0000 mg | Freq: Once | INTRAMUSCULAR | Status: AC
  Administered 2016-06-18: 10 mg via INTRAVENOUS
  Filled 2016-06-18: qty 1

## 2016-06-18 MED ORDER — POTASSIUM CHLORIDE 10 MEQ/100ML IV SOLN
10.0000 meq | INTRAVENOUS | Status: AC
  Administered 2016-06-18 (×3): 10 meq via INTRAVENOUS
  Filled 2016-06-18 (×2): qty 100

## 2016-06-18 MED ORDER — POTASSIUM CHLORIDE 10 MEQ/100ML IV SOLN
INTRAVENOUS | Status: AC
Start: 2016-06-18 — End: 2016-06-18
  Administered 2016-06-18: 10 meq via INTRAVENOUS
  Filled 2016-06-18: qty 100

## 2016-06-18 MED ORDER — LEVETIRACETAM 500 MG/5ML IV SOLN
500.0000 mg | Freq: Two times a day (BID) | INTRAVENOUS | Status: DC
  Administered 2016-06-18 – 2016-06-20 (×5): 500 mg via INTRAVENOUS
  Filled 2016-06-18 (×7): qty 5

## 2016-06-18 MED ORDER — LORAZEPAM 2 MG/ML IJ SOLN
1.0000 mg | Freq: Once | INTRAMUSCULAR | Status: AC
  Administered 2016-06-18: 1 mg via INTRAVENOUS
  Filled 2016-06-18: qty 1

## 2016-06-18 MED ORDER — GADOBENATE DIMEGLUMINE 529 MG/ML IV SOLN
16.0000 mL | Freq: Once | INTRAVENOUS | Status: AC | PRN
  Administered 2016-06-18: 16 mL via INTRAVENOUS

## 2016-06-18 NOTE — H&P (Signed)
PULMONARY / CRITICAL CARE MEDICINE   Name: Jaime [name] MRN: [id number] DOB: March 08, 1965    ADMISSION DATE:  06/17/2016 CONSULTATION DATE:  06/18/2016  REFERRING MD:  Dr. Blaine Hamper  CHIEF COMPLAINT:  Headache   HISTORY OF PRESENT ILLNESS:   51 year old female with PMH of DM, DUB, GERD, Hydradenitis, and Intellectual disability.   Presents to ED on 5/19 with reported 2 weeks of headache with nausea, vomiting, and sensitivity to light. Went to Urgent Care on 5/10 with left earache and on 5/12 with complaints of vomiting and stating that she was nervous that insects may be living in her head. Upon arrival to the ED patient has difficulty with speech and answering questions. CT revealed edema with midline shift that was concerning for mass, MRI confirmed non-enhancing tumor. Given decadron and neurosurgery consulted. PCCM asked to admit.   PAST MEDICAL HISTORY :  She  has a past medical history of Diabetes mellitus; DUB (dysfunctional uterine bleeding); Furuncle of multiple sites; GERD (gastroesophageal reflux disease); Hydradenitis; and Intellectual disability.  PAST SURGICAL HISTORY: She  has a past surgical history that includes Tonsillectomy and adenoidectomy; Abdominal hysterectomy; and Hydradenitis excision.  No Known Allergies  No current facility-administered medications on file prior to encounter.    Current Outpatient Prescriptions on File Prior to Encounter  Medication Sig  . fluticasone (FLONASE) 50 MCG/ACT nasal spray Place 2 sprays into both nostrils daily.  Marland Kitchen lisinopril (PRINIVIL,ZESTRIL) 40 MG tablet Take 1 tablet (40 mg total) by mouth daily.  Marland Kitchen loratadine (CLARITIN) 10 MG tablet Take 1 tablet (10 mg total) by mouth daily.  . metFORMIN (GLUCOPHAGE) 1000 MG tablet Take 1 tablet (1,000 mg total) by mouth 2 (two) times daily with a meal.  . montelukast (SINGULAIR) 10 MG tablet Take 1 tablet (10 mg total) by mouth at bedtime.  . pravastatin (PRAVACHOL) 20 MG tablet TAKE 1 TABLET  BY MOUTH EVERY EVENING  . predniSONE (DELTASONE) 50 MG tablet Take 1 tablet (50 mg total) by mouth daily.  . ranitidine (ZANTAC) 300 MG tablet Take 1 tablet (300 mg total) by mouth at bedtime.    FAMILY HISTORY:  Her indicated that the status of her mother is unknown. She indicated that the status of her father is unknown.    SOCIAL HISTORY: She  reports that she has never smoked. She has never used smokeless tobacco. She reports that she does not drink alcohol or use drugs.  REVIEW OF SYSTEMS:   All negative; except for those that are bolded, which indicate positives.  Constitutional: weight loss, weight gain, night sweats, fevers, chills, fatigue, weakness.  HEENT: headaches, sore throat, sneezing, nasal congestion, post nasal drip, difficulty swallowing, tooth/dental problems, visual complaints, visual changes, ear aches. Neuro: difficulty with speech, weakness, numbness, ataxia. CV:  chest pain, orthopnea, PND, swelling in lower extremities, dizziness, palpitations, syncope.  Resp: cough, hemoptysis, dyspnea, wheezing. GI: heartburn, indigestion, abdominal pain, nausea, vomiting, diarrhea, constipation, change in bowel habits, loss of appetite, hematemesis, melena, hematochezia.  GU: dysuria, change in color of urine, urgency or frequency, flank pain, hematuria. MSK: joint pain or swelling, decreased range of motion. Psych: change in mood or affect, depression, anxiety, suicidal ideations, homicidal ideations. Skin: rash, itching, bruising.   SUBJECTIVE:  Currently in ED, states headache continues.   VITAL SIGNS: BP 105/75   Pulse 87   Temp 98.3 F (36.8 C)   Resp 15   LMP 10/31/2005   SpO2 100%   HEMODYNAMICS:    VENTILATOR SETTINGS:  INTAKE / OUTPUT: No intake/output data recorded.  PHYSICAL EXAMINATION: General:  Adult female, no distress  Neuro:  Lethargic, follows commands, answers questions with delay  HEENT:  Normocephalic  Cardiovascular:  RRR, no  MRG, NI S1/S2  Lungs:  Clear breath sounds, non-labored  Abdomen:  Non-distended, non-tender, active bowel  Musculoskeletal:  -edema  Skin:  Warm, dry, intact   LABS:  BMET  Recent Labs Lab 06/17/16 2202  NA 138  K 2.8*  CL 95*  CO2 32  BUN <5*  CREATININE 0.50  GLUCOSE 159*    Electrolytes  Recent Labs Lab 06/17/16 2202 06/18/16 0453  CALCIUM 9.4  --   MG  --  1.4*    CBC  Recent Labs Lab 06/17/16 2202  WBC 6.7  HGB 10.8*  HCT 33.0*  PLT 306    Coag's  Recent Labs Lab 06/18/16 0453  APTT 28  INR 1.12    Sepsis Markers No results for input(s): LATICACIDVEN, PROCALCITON, O2SATVEN in the last 168 hours.  ABG No results for input(s): PHART, PCO2ART, PO2ART in the last 168 hours.  Liver Enzymes  Recent Labs Lab 06/17/16 2202  AST 15  ALT 9*  ALKPHOS 50  BILITOT 0.5  ALBUMIN 3.9    Cardiac Enzymes No results for input(s): TROPONINI, PROBNP in the last 168 hours.  Glucose No results for input(s): GLUCAP in the last 168 hours.  Imaging Ct Head Wo Contrast  Result Date: 06/18/2016 CLINICAL DATA:  Headache and vomiting. EXAM: CT HEAD WITHOUT CONTRAST TECHNIQUE: Contiguous axial images were obtained from the base of the skull through the vertex without intravenous contrast. COMPARISON:  None. FINDINGS: Brain: Large area of low-density within the left temporal and occipital lobe consistent with edema, probable central mass lesion. There is left right midline shift of 12 mm, with mild dilatation of the right lateral ventricle. Minimal periventricular white matter changes about the right lateral ventricle. Mild mass effect on the midbrain, the basilar cisterns remain patent. No evidence of hemorrhage. Vascular: No hyperdense vessel or unexpected calcification. Skull: No fracture or focal lesion. Sinuses/Orbits: Paranasal sinuses and mastoid air cells are clear. The visualized orbits are unremarkable. Other: None. IMPRESSION: Findings concerning for  intra-axial mass in the central left occipital temporal lobe with large area of surrounding edema. There is 12 mm left-to-right midline shift. Mass effect on the midbrain, however basilar cisterns remain patent. Mild dilatation of the right lateral ventricle with question surrounding periventricular edema. Recommend MRI with contrast for characterization. These results were called by telephone at the time of interpretation on 06/18/2016 at 1:08 am to Dr. Thayer Jew , who verbally acknowledged these results. Electronically Signed   By: Jeb Levering M.D.   On: 06/18/2016 01:08   Mr Brain W And Wo Contrast  Result Date: 06/18/2016 CLINICAL DATA:  Headaches and increasing confusion. Suspected mass on CT EXAM: MRI HEAD WITHOUT AND WITH CONTRAST TECHNIQUE: Multiplanar, multiecho pulse sequences of the brain and surrounding structures were obtained without and with intravenous contrast. CONTRAST:  24mL MULTIHANCE GADOBENATE DIMEGLUMINE 529 MG/ML IV SOLN COMPARISON:  Head CT 06/18/2016 FINDINGS: Brain: The midline structures are normal. There is no focal diffusion restriction to indicate acute infarct. There is a peripherally contrast-enhancing mass centered in the left temporal lobe that measures 6.0 x 4.8 x 3.0 cm (AP x Transverse x CC). Surrounding hyperintense T2 weighted signal extends into the right frontal, parietal, temporal and occipital white matter. There is mass effect with rightward midline shift measuring 11 mm  at the level of the foramina of [place]. There is early subfalcine herniation of the cingulate gyrus. Areas of susceptibility within the mass may indicate foci of remote hemorrhage. No intraparenchymal hematoma. Mass effect on the left lateral ventricle with mild dilatation of the right lateral ventricle, but no current evidence of ventricular trapping. Basal cisterns remain evident. The dura is normal and there is no extra-axial collection. Vascular: Major intracranial arterial and venous  sinus flow voids are preserved. Skull and upper cervical spine: The visualized skull base, calvarium, upper cervical spine and extracranial soft tissues are normal. Sinuses/Orbits: No fluid levels or advanced mucosal thickening. No mastoid effusion. Normal orbits. IMPRESSION: 1. Centrally necrotic, peripherally contrast-enhancing mass centered in the left temporal lobe is most consistent with a high-grade glioma. Surrounding hyperintense T2 weighted signal is likely a combination of vasogenic edema and nonenhancing tumor. No satellite lesions. 2. 11 mm of rightward midline shift with early subfalcine herniation of the left cingulate gyrus. Midline shift is unchanged from earlier head CT. 3. Mass effect on the left lateral ventricle with mildly dilated appearance of the right lateral ventricle. The patient is at risk for right lateral ventricular trapping, though this has not occurred yet. Electronically Signed   By: Ulyses Jarred M.D.   On: 06/18/2016 03:14     STUDIES:  CT Head 5/20 > Findings concerning for intra-axial mass in the central left occipital temporal lobe with large area of surrounding edema, there is a 12 mm left-to-right midline shift. Mass effect on the midbrain, basilar cisterns remains patent. Mild dilatation of the right lateral ventricle with question surrounding periventricular edema  MRI Brain 5/20 > Centrally necrotic, peripherally contrast-enhancing mass centered in the left temporal lobe is most consistent with a high-grade glioma. Surrounding hyperintense T2 weighted signal is likely a combination of vasogenic edema and non-enhancing tumor, 11 mm of rightward midline shift with early subfalcine herniation of the left cingulate gyrus, midline shift is unchanged from earlier CT, mass effect on the left lateral ventricle with mildly dilated appearance of the right lateral ventricle.   CULTURES: U/A 5/19 > Large Leukocytes  Urine Culture 5/19 >>   ANTIBIOTICS: None.    SIGNIFICANT EVENTS: 5/19 > Presents to ED   LINES/TUBES: PIV  DISCUSSION: 51 year old female presents with 2 week reported headache found to have temporal lobe mass with 11 mm midline shift.   ASSESSMENT / PLAN:  PULMONARY A: At risk for inability to protect airway  P:   Monitor  Maintain Saturation >92  CARDIOVASCULAR A:  H/O HTN P:  Cardiac Monitoring  Hold home lisinopril PRN hydralazine for systolic >409  RENAL A:   Hypokalemia  Hypomagnesemia  P:   Trend BMP Replace electrolytes   GASTROINTESTINAL A:   GERD P:   PPI  NPO  HEMATOLOGIC A:   DVT prophylaxis  P:  Heparin SQ  SCD  INFECTIOUS A:   No issues  P:   Trend WBC and Fever Curve   ENDOCRINE A:   DM   P:   SSI Trend Glucose   NEUROLOGIC A:   Temporal Lobe Mass with 11 mm shift  P:   RASS goal: 0 Neurosurgery consulted  Monitor Neuro Checks [facility] Keppra for seizure prophylaxis  Decadron 10 mg q6h    FAMILY  - Updates: Family updated at bedside in ED   - Inter-disciplinary family meet or Palliative Care meeting due by: 06/24/2016   CC Time: 42 minutes   Hayden Pedro, AGACNP-BC [facility]  Pgr: [phone]  PCCM Pgr: [phone]   STAFF NOTE: Linwood Dibbles, MD FACP have personally reviewed patient's available data, including medical history, events of note, physical examination and test results as part of my evaluation. I have discussed with resident/NP and other care providers such as pharmacist, RN and RRT. In addition, I personally evaluated patient and elicited key findings of: awake, calm, strong ext all 4, O x 2, perrl, poor word findings, CT I reviewed with mass edema left and MRI brain In reviewed as well, large mass edema, appears to be deep location, no seizures on presentation, NS consulted, will look for any recs of options debulk but likely more appropriate for radiation / chemo assessment, glio?, decadron to maintain for  associated edema, NO role mannitol at this stage, continued frequent neuro checks, ssi needed, may need to increase scale, Bp is wnl, keppra seems reasonable for suppression seizure risk, maintain sub q heparin until we have NS recs / options surgery, I worry about her risk of herniation / hydro with current findings, I dont see a role for saline at 75 , only risk edema increase= kvo, hypokalemia, supp and recheck in pm , with associated hypokalemia, we shold aggressive replace mag and re assess in am  The patient is critically ill with multiple organ systems failure and requires high complexity decision making for assessment and support, frequent evaluation and titration of therapies, application of advanced monitoring technologies and extensive interpretation of multiple databases.   Critical Care Time devoted to patient care services described in this note is 30 Minutes. This time reflects time of care of this signee: Merrie Roof, MD FACP. This critical care time does not reflect procedure time, or teaching time or supervisory time of PA/NP/Med student/Med Resident etc but could involve care discussion time. Rest per NP/medical resident whose note is outlined above and that I agree with   Lavon Paganini. Titus Mould, MD, [place] Pgr: [facility] 06/18/2016 9:23 AM

## 2016-06-18 NOTE — ED Provider Notes (Addendum)
[facility] DEPT Provider Note   CSN: [id number] Arrival date & time: 06/17/16  2127  By signing my name below, I, Lise Auer, attest that this documentation has been prepared under the direction and in the presence of Horton, Barbette Hair, MD. Electronically Signed: Lise Auer, ED Scribe. 06/18/16. 5:35 AM.  History   Chief Complaint Chief Complaint  Patient presents with  . Headache  . Emesis   The history is provided by the patient and a relative. No language interpreter was used.    HPI Comments: Jaime [name] is a 51 y.o. female with a PMHx of DM, GERD, and DUB, who presents to the Emergency Department complaining of a constant, headache that started two weeks ago. Her associated symptoms include vomiting, congestion, unable to sleep, and, sensitivity to light. She notes significant pain to the left temporal area. Her headache is worse in the morning and worsened with bending. Excedrin migraine tried with no relief.Current pain is 6 out of 10. Denies chest pain, shortness of breath, amd abdominal pain. Mother provides some of the history. Patient at times does not answer questions appropriately. No noted weakness, numbness, tingling or neurologic symptoms.   Past Medical History:  Diagnosis Date  . Diabetes mellitus   . DUB (dysfunctional uterine bleeding)    total abdominal hysterectomy in 2007, ovaries intact  . Furuncle of multiple sites    multiple I and D's  . GERD (gastroesophageal reflux disease)   . Hydradenitis     Patient Active Problem List   Diagnosis Date Noted  . Brain mass 06/18/2016  . Nausea vomiting and diarrhea 06/18/2016  . HLD (hyperlipidemia) 06/18/2016  . GERD (gastroesophageal reflux disease) 06/18/2016  . Nonproliferative diabetic retinopathy ([place]) 12/17/2015  . Onychomycosis of left great toe 01/13/2014  . Healthcare maintenance 09/23/2013  . Hypertension 06/11/2013  . Diabetes mellitus type 2 with retinopathy ([place]) 11/30/2009  . Obesity  09/28/2009  . SICKLE-CELL TRAIT 09/28/2009    Past Surgical History:  Procedure Laterality Date  . ABDOMINAL HYSTERECTOMY    . HYDRADENITIS EXCISION    . TONSILLECTOMY AND ADENOIDECTOMY     in early childhood    OB History    No data available       Home Medications    Prior to Admission medications   Medication Sig Start Date End Date Taking? Authorizing Provider  fluticasone (FLONASE) 50 MCG/ACT nasal spray Place 2 sprays into both nostrils daily. 06/08/16  Yes Barnet Glasgow, NP  lisinopril (PRINIVIL,ZESTRIL) 40 MG tablet Take 1 tablet (40 mg total) by mouth daily. 03/27/16 06/18/16 Yes Minus Liberty, MD  loratadine (CLARITIN) 10 MG tablet Take 1 tablet (10 mg total) by mouth daily. 06/08/16  Yes Barnet Glasgow, NP  metFORMIN (GLUCOPHAGE) 1000 MG tablet Take 1 tablet (1,000 mg total) by mouth 2 (two) times daily with a meal. 12/27/15 06/18/16 Yes Minus Liberty, MD  montelukast (SINGULAIR) 10 MG tablet Take 1 tablet (10 mg total) by mouth at bedtime. 06/08/16  Yes Barnet Glasgow, NP  pravastatin (PRAVACHOL) 20 MG tablet TAKE 1 TABLET BY MOUTH EVERY EVENING 03/03/16  Yes Minus Liberty, MD  predniSONE (DELTASONE) 50 MG tablet Take 1 tablet (50 mg total) by mouth daily. 06/10/16  Yes Sherlene Shams, MD  ranitidine (ZANTAC) 300 MG tablet Take 1 tablet (300 mg total) by mouth at bedtime. 06/08/16  Yes Barnet Glasgow, NP    Family History Family History  Problem Relation Age of Onset  . Cancer Mother 67  Breast cancer  . Hypertension Mother   . Depression Mother   . Sickle cell trait Mother   . Cancer Father 28       lung cancer, was a long term smoker    Social History Social History  Substance Use Topics  . Smoking status: Never Smoker  . Smokeless tobacco: Never Used  . Alcohol use No     Allergies   Patient has no known allergies.   Review of Systems Review of Systems  Eyes: Positive for photophobia.       [place]  Cardiovascular:  Negative for chest pain.  Gastrointestinal: Positive for nausea and vomiting. Negative for abdominal pain.  Neurological: Positive for headaches. Negative for weakness and numbness.  Psychiatric/Behavioral: Positive for hallucinations.  All other systems reviewed and are negative.    Physical Exam Updated Vital Signs BP 119/76   Pulse (!) 104   Temp 98.3 F (36.8 C)   Resp 16   LMP 10/31/2005   SpO2 100%   Physical Exam  Constitutional: She is oriented to person, place, and time. She appears well-developed and well-nourished. No distress.  HENT:  Head: Normocephalic and atraumatic.  Eyes: Pupils are equal, round, and reactive to light.  Pupils 4 mm reactive bilaterally  Neck: Normal range of motion. Neck supple.  Cardiovascular: Normal rate, regular rhythm and normal heart sounds.   Pulmonary/Chest: Effort normal and breath sounds normal. No respiratory distress. She has no wheezes.  Abdominal: Soft. There is no tenderness.  Neurological: She is alert and oriented to person, place, and time.  At times speech pattern is not fluent and patient answers some questions inappropriately, cranial nerves appear intact, 5 out of 5 strength in all 4 extremities, no dysmetria to finger-nose-finger  Skin: Skin is warm and dry.  Psychiatric:  Flat affect  Nursing note and vitals reviewed.    ED Treatments / Results  DIAGNOSTIC STUDIES: Oxygen Saturation is 98% on RA, normal by my interpretation.   COORDINATION OF CARE: 12:18 AM-Discussed next steps with pt. Pt verbalized understanding and is agreeable with the plan.   Labs (all labs ordered are listed, but only abnormal results are displayed) Labs Reviewed  COMPREHENSIVE METABOLIC PANEL - Abnormal; Notable for the following:       Result Value   Potassium 2.8 (*)    Chloride 95 (*)    Glucose, Bld 159 (*)    BUN <5 (*)    ALT 9 (*)    All other components within normal limits  CBC - Abnormal; Notable for the following:     Hemoglobin 10.8 (*)    HCT 33.0 (*)    All other components within normal limits  URINALYSIS, ROUTINE W REFLEX MICROSCOPIC - Abnormal; Notable for the following:    APPearance CLOUDY (*)    Leukocytes, UA LARGE (*)    Bacteria, UA FEW (*)    Squamous Epithelial / LPF 6-30 (*)    All other components within normal limits  URINE CULTURE  C DIFFICILE QUICK SCREEN W PCR REFLEX  RAPID URINE DRUG SCREEN, HOSP PERFORMED  PROTIME-INR  APTT  MAGNESIUM  URINALYSIS, ROUTINE W REFLEX MICROSCOPIC  TYPE AND SCREEN    EKG  EKG Interpretation None       Radiology Ct Head Wo Contrast  Result Date: 06/18/2016 CLINICAL DATA:  Headache and vomiting. EXAM: CT HEAD WITHOUT CONTRAST TECHNIQUE: Contiguous axial images were obtained from the base of the skull through the vertex without intravenous contrast. COMPARISON:  None.  FINDINGS: Brain: Large area of low-density within the left temporal and occipital lobe consistent with edema, probable central mass lesion. There is left right midline shift of 12 mm, with mild dilatation of the right lateral ventricle. Minimal periventricular white matter changes about the right lateral ventricle. Mild mass effect on the midbrain, the basilar cisterns remain patent. No evidence of hemorrhage. Vascular: No hyperdense vessel or unexpected calcification. Skull: No fracture or focal lesion. Sinuses/Orbits: Paranasal sinuses and mastoid air cells are clear. The visualized orbits are unremarkable. Other: None. IMPRESSION: Findings concerning for intra-axial mass in the central left occipital temporal lobe with large area of surrounding edema. There is 12 mm left-to-right midline shift. Mass effect on the midbrain, however basilar cisterns remain patent. Mild dilatation of the right lateral ventricle with question surrounding periventricular edema. Recommend MRI with contrast for characterization. These results were called by telephone at the time of interpretation on 06/18/2016  at 1:08 am to Dr. Thayer Jew , who verbally acknowledged these results. Electronically Signed   By: Jeb Levering M.D.   On: 06/18/2016 01:08   Mr Brain W And Wo Contrast  Result Date: 06/18/2016 CLINICAL DATA:  Headaches and increasing confusion. Suspected mass on CT EXAM: MRI HEAD WITHOUT AND WITH CONTRAST TECHNIQUE: Multiplanar, multiecho pulse sequences of the brain and surrounding structures were obtained without and with intravenous contrast. CONTRAST:  23mL MULTIHANCE GADOBENATE DIMEGLUMINE 529 MG/ML IV SOLN COMPARISON:  Head CT 06/18/2016 FINDINGS: Brain: The midline structures are normal. There is no focal diffusion restriction to indicate acute infarct. There is a peripherally contrast-enhancing mass centered in the left temporal lobe that measures 6.0 x 4.8 x 3.0 cm (AP x Transverse x CC). Surrounding hyperintense T2 weighted signal extends into the right frontal, parietal, temporal and occipital white matter. There is mass effect with rightward midline shift measuring 11 mm at the level of the foramina of [place]. There is early subfalcine herniation of the cingulate gyrus. Areas of susceptibility within the mass may indicate foci of remote hemorrhage. No intraparenchymal hematoma. Mass effect on the left lateral ventricle with mild dilatation of the right lateral ventricle, but no current evidence of ventricular trapping. Basal cisterns remain evident. The dura is normal and there is no extra-axial collection. Vascular: Major intracranial arterial and venous sinus flow voids are preserved. Skull and upper cervical spine: The visualized skull base, calvarium, upper cervical spine and extracranial soft tissues are normal. Sinuses/Orbits: No fluid levels or advanced mucosal thickening. No mastoid effusion. Normal orbits. IMPRESSION: 1. Centrally necrotic, peripherally contrast-enhancing mass centered in the left temporal lobe is most consistent with a high-grade glioma. Surrounding hyperintense  T2 weighted signal is likely a combination of vasogenic edema and nonenhancing tumor. No satellite lesions. 2. 11 mm of rightward midline shift with early subfalcine herniation of the left cingulate gyrus. Midline shift is unchanged from earlier head CT. 3. Mass effect on the left lateral ventricle with mildly dilated appearance of the right lateral ventricle. The patient is at risk for right lateral ventricular trapping, though this has not occurred yet. Electronically Signed   By: Ulyses Jarred M.D.   On: 06/18/2016 03:14    Procedures Procedures (including critical care time)  CRITICAL CARE Performed by: Merryl Hacker   Total critical care time: 35 minutes  Critical care time was exclusive of separately billable procedures and treating other patients.  Critical care was necessary to treat or prevent imminent or life-threatening deterioration.  Critical care was time spent personally by me  on the following activities: development of treatment plan with patient and/or surrogate as well as nursing, discussions with consultants, evaluation of patient's response to treatment, examination of patient, obtaining history from patient or surrogate, ordering and performing treatments and interventions, ordering and review of laboratory studies, ordering and review of radiographic studies, pulse oximetry and re-evaluation of patient's condition.   Medications Ordered in ED Medications  magnesium sulfate IVPB 1 g 100 mL (1 g Intravenous New Bag/Given 06/18/16 0513)  potassium chloride 10 mEq in 100 mL IVPB (10 mEq Intravenous New Bag/Given 06/18/16 0513)  dexamethasone (DECADRON) injection 10 mg (10 mg Intravenous Given 06/18/16 0514)  fluticasone (FLONASE) 50 MCG/ACT nasal spray 2 spray (not administered)  ondansetron (ZOFRAN) injection 4 mg (not administered)  famotidine (PEPCID) IVPB 20 mg premix (not administered)  0.9 %  sodium chloride infusion ( Intravenous New Bag/Given 06/18/16 0513)    hydrALAZINE (APRESOLINE) injection 5 mg (not administered)  sodium chloride 0.9 % bolus 1,000 mL (0 mLs Intravenous Stopped 06/18/16 0135)  prochlorperazine (COMPAZINE) injection 10 mg (10 mg Intravenous Given 06/18/16 0029)  diphenhydrAMINE (BENADRYL) injection 25 mg (25 mg Intravenous Given 06/18/16 0029)  dexamethasone (DECADRON) injection 10 mg (10 mg Intravenous Given 06/18/16 0135)  LORazepam (ATIVAN) injection 1 mg (1 mg Intravenous Given 06/18/16 0356)  gadobenate dimeglumine (MULTIHANCE) injection 16 mL (16 mLs Intravenous Contrast Given 06/18/16 0241)  potassium chloride 10 mEq in 100 mL IVPB (0 mEq Intravenous Stopped 06/18/16 0513)     Initial Impression / Assessment and Plan / ED Course  I have reviewed the triage vital signs and the nursing notes.  Pertinent labs & imaging results that were available during my care of the patient were reviewed by me and considered in my medical decision making (see chart for details).  Clinical Course as of Jun 18 533  Sun Jun 18, 2016  0400 Discussed with neurosurgery, Dr. Ronnald Ramp. He will evaluate the patient first thing in the morning. He has reviewed the scans.  [CH]    Clinical Course User Index [CH] Horton, Barbette Hair, MD    Patient presents with several weeks of headache and vomiting. She is nontoxic on exam. Her affect is odd and she sometimes has difficulty with speech and answering questions. Otherwise her neurologic exam appears to be grossly intact. Given worsening of headache in the morning and vomiting, will obtain CT to evaluate for mass lesion. Patient was given migraine cocktail. CT scan shows edema with midline shift and is concerning for a mass. Subsequently MRI was ordered. MRI confirms nonenhancing tumor with edema. Patient was given IV Decadron. She had her family were updated at the bedside. Will consult neurosurgery. Anticipated admission.   Final Clinical Impressions(s) / ED Diagnoses   Final diagnoses:  Brain tumor  ([place])  Intractable headache, unspecified chronicity pattern, unspecified headache type  Non-intractable vomiting with nausea, unspecified vomiting type    New Prescriptions New Prescriptions   No medications on file   I personally performed the services described in this documentation, which was scribed in my presence. The recorded information has been reviewed and is accurate.     Merryl Hacker, MD 06/18/16 [id number]    Merryl Hacker, MD 06/18/16 [phone]

## 2016-06-18 NOTE — Consult Note (Signed)
Reason for Consult: Left temporal brain mass Referring Physician: EDP  Jearline A Heumann is an 51 y.o. female.   HPI:  51 year old female admitted with a large left temporal brain mass. The patient cannot give me a full history. It appears she had some difficulty with speech and just wasn't quite herself. She was brought to the emergency Department where head CT suggested a left temporal lesion. MRI confirmed an enhancing left temporal lesion with surrounding edema. She was admitted for further care. She was started on Decadron. She has been having some Left sided headaches for a few weeks The family reports.  Past Medical History:  Diagnosis Date  . Diabetes mellitus   . DUB (dysfunctional uterine bleeding)    total abdominal hysterectomy in 2007, ovaries intact  . Furuncle of multiple sites    multiple I and D's  . GERD (gastroesophageal reflux disease)   . Hydradenitis   . Intellectual disability     Past Surgical History:  Procedure Laterality Date  . ABDOMINAL HYSTERECTOMY    . HYDRADENITIS EXCISION    . TONSILLECTOMY AND ADENOIDECTOMY     in early childhood    No Known Allergies  Social History  Substance Use Topics  . Smoking status: Never Smoker  . Smokeless tobacco: Never Used  . Alcohol use No    Family History  Problem Relation Age of Onset  . Cancer Mother 66       Breast cancer  . Hypertension Mother   . Depression Mother   . Sickle cell trait Mother   . Cancer Father 91       lung cancer, was a long term smoker     Review of Systems  Positive ROS: neg  All other systems have been reviewed and were otherwise negative with the exception of those mentioned in the HPI and as above.  Objective: Vital signs in last 24 hours: Temp:  [98.3 F (36.8 C)-98.7 F (37.1 C)] 98.6 F (37 C) (05/20 0756) Pulse Rate:  [61-104] 98 (05/20 0702) Resp:  [12-19] 12 (05/20 0702) BP: (105-152)/(72-91) 123/81 (05/20 0702) SpO2:  [98 %-100 %] 100 % (05/20 0600) Weight:   [74.5 kg (164 lb 3.9 oz)] 74.5 kg (164 lb 3.9 oz) (05/20 0702)  General Appearance: Alert, cooperative, no distress, appears stated age Head: Normocephalic, without obvious abnormality, atraumatic Eyes: PERRL, conjunctiva/corneas clear, EOM's intact    Throat: benign Neck: Supple, symmetrical, trachea midline Back: Symmetric, no curvature, ROM normal, no CVA tenderness Lungs:  respirations unlabored Heart: Regular rate and rhythm Abdomen: Soft Extremities: Extremities normal, atraumatic, no cyanosis or edema Pulses: 2+ and symmetric all extremities Skin: Skin color, texture, turgor normal, no rashes or lesions  NEUROLOGIC:   Mental status: A&O x4, seems to have good reception, fairly good naming though she did call a wrist watch a clock, fairly good repetition, good attention span, Memory and fund of knowledge difficult to assess Motor Exam - grossly normal, normal tone and bulk, no drift Sensory Exam - grossly normal Reflexes: symmetric Coordination - grossly normal Gait - not tested Balance - not tested Cranial Nerves: I: smell Not tested  II: visual acuity  OS: na    OD: na  II: visual fields Full to confrontation  II: pupils Equal, round, reactive to light  III,VII: ptosis None  III,IV,VI: extraocular muscles  Full ROM  V: mastication Normal  V: facial light touch sensation  Normal  V,VII: corneal reflex  Present  VII: facial muscle function - upper  Normal  VII: facial muscle function - lower Normal  VIII: hearing Not tested  IX: soft palate elevation  Normal  IX,X: gag reflex Present  XI: trapezius strength  5/5  XI: sternocleidomastoid strength 5/5  XI: neck flexion strength  5/5  XII: tongue strength  Normal    Data Review Lab Results  Component Value Date   WBC 6.7 06/17/2016   HGB 10.8 (L) 06/17/2016   HCT 33.0 (L) 06/17/2016   MCV 83.8 06/17/2016   PLT 306 06/17/2016   Lab Results  Component Value Date   NA 138 06/17/2016   K 2.8 (L) 06/17/2016    CL 95 (L) 06/17/2016   CO2 32 06/17/2016   BUN <5 (L) 06/17/2016   CREATININE 0.50 06/17/2016   GLUCOSE 159 (H) 06/17/2016   Lab Results  Component Value Date   INR 1.12 06/18/2016    Radiology: Ct Head Wo Contrast  Result Date: 06/18/2016 CLINICAL DATA:  Headache and vomiting. EXAM: CT HEAD WITHOUT CONTRAST TECHNIQUE: Contiguous axial images were obtained from the base of the skull through the vertex without intravenous contrast. COMPARISON:  None. FINDINGS: Brain: Large area of low-density within the left temporal and occipital lobe consistent with edema, probable central mass lesion. There is left right midline shift of 12 mm, with mild dilatation of the right lateral ventricle. Minimal periventricular white matter changes about the right lateral ventricle. Mild mass effect on the midbrain, the basilar cisterns remain patent. No evidence of hemorrhage. Vascular: No hyperdense vessel or unexpected calcification. Skull: No fracture or focal lesion. Sinuses/Orbits: Paranasal sinuses and mastoid air cells are clear. The visualized orbits are unremarkable. Other: None. IMPRESSION: Findings concerning for intra-axial mass in the central left occipital temporal lobe with large area of surrounding edema. There is 12 mm left-to-right midline shift. Mass effect on the midbrain, however basilar cisterns remain patent. Mild dilatation of the right lateral ventricle with question surrounding periventricular edema. Recommend MRI with contrast for characterization. These results were called by telephone at the time of interpretation on 06/18/2016 at 1:08 am to Dr. Thayer Jew , who verbally acknowledged these results. Electronically Signed   By: Jeb Levering M.D.   On: 06/18/2016 01:08   Mr Brain W And Wo Contrast  Result Date: 06/18/2016 CLINICAL DATA:  Headaches and increasing confusion. Suspected mass on CT EXAM: MRI HEAD WITHOUT AND WITH CONTRAST TECHNIQUE: Multiplanar, multiecho pulse sequences of  the brain and surrounding structures were obtained without and with intravenous contrast. CONTRAST:  53mL MULTIHANCE GADOBENATE DIMEGLUMINE 529 MG/ML IV SOLN COMPARISON:  Head CT 06/18/2016 FINDINGS: Brain: The midline structures are normal. There is no focal diffusion restriction to indicate acute infarct. There is a peripherally contrast-enhancing mass centered in the left temporal lobe that measures 6.0 x 4.8 x 3.0 cm (AP x Transverse x CC). Surrounding hyperintense T2 weighted signal extends into the right frontal, parietal, temporal and occipital white matter. There is mass effect with rightward midline shift measuring 11 mm at the level of the foramina of [place]. There is early subfalcine herniation of the cingulate gyrus. Areas of susceptibility within the mass may indicate foci of remote hemorrhage. No intraparenchymal hematoma. Mass effect on the left lateral ventricle with mild dilatation of the right lateral ventricle, but no current evidence of ventricular trapping. Basal cisterns remain evident. The dura is normal and there is no extra-axial collection. Vascular: Major intracranial arterial and venous sinus flow voids are preserved. Skull and upper cervical spine: The visualized skull base, calvarium,  upper cervical spine and extracranial soft tissues are normal. Sinuses/Orbits: No fluid levels or advanced mucosal thickening. No mastoid effusion. Normal orbits. IMPRESSION: 1. Centrally necrotic, peripherally contrast-enhancing mass centered in the left temporal lobe is most consistent with a high-grade glioma. Surrounding hyperintense T2 weighted signal is likely a combination of vasogenic edema and nonenhancing tumor. No satellite lesions. 2. 11 mm of rightward midline shift with early subfalcine herniation of the left cingulate gyrus. Midline shift is unchanged from earlier head CT. 3. Mass effect on the left lateral ventricle with mildly dilated appearance of the right lateral ventricle. The patient  is at risk for right lateral ventricular trapping, though this has not occurred yet. Electronically Signed   By: Ulyses Jarred M.D.   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 It already enters the ventricle on the left and extends all the way to the brainstem and perimesencephalic cistern. Gross total resection is probably not possible, especially without neurologic deficit. The benefit of surgery would be gross debulking of the lesion and getting cells for pathologic diagnosis, but without gross total resection would probably not prolong survival and would put her at risk of neurologic deficit. I have explained all of this to the family. I have explained the potential benefits of palliative radiation therapy and oral Temodar. She could also consider hospice care. I have spoken with the family at length. They're going to consider their options and I'm going to try to get her on the brain tumor board conference schedule for tomorrow for a team approach to decision making.   Page Lancon S 06/18/2016 9:23 AM

## 2016-06-18 NOTE — ED Notes (Signed)
QNS for Type and screen,  Will have 2nd phleb to collect.  Notified nurse.

## 2016-06-19 DIAGNOSIS — Z7189 Other specified counseling: Secondary | ICD-10-CM

## 2016-06-19 DIAGNOSIS — R51 Headache: Secondary | ICD-10-CM

## 2016-06-19 DIAGNOSIS — I10 Essential (primary) hypertension: Secondary | ICD-10-CM

## 2016-06-19 DIAGNOSIS — E119 Type 2 diabetes mellitus without complications: Secondary | ICD-10-CM

## 2016-06-19 DIAGNOSIS — F840 Autistic disorder: Secondary | ICD-10-CM

## 2016-06-19 DIAGNOSIS — C719 Malignant neoplasm of brain, unspecified: Secondary | ICD-10-CM

## 2016-06-19 DIAGNOSIS — Z515 Encounter for palliative care: Secondary | ICD-10-CM

## 2016-06-19 LAB — CBC
HEMATOCRIT: 34.6 % — AB (ref 36.0–46.0)
HEMOGLOBIN: 11.4 g/dL — AB (ref 12.0–15.0)
MCH: 27.5 pg (ref 26.0–34.0)
MCHC: 32.9 g/dL (ref 30.0–36.0)
MCV: 83.6 fL (ref 78.0–100.0)
Platelets: 330 10*3/uL (ref 150–400)
RBC: 4.14 MIL/uL (ref 3.87–5.11)
RDW: 13.8 % (ref 11.5–15.5)
WBC: 9.7 10*3/uL (ref 4.0–10.5)

## 2016-06-19 LAB — URINE CULTURE

## 2016-06-19 LAB — GLUCOSE, CAPILLARY
GLUCOSE-CAPILLARY: 273 mg/dL — AB (ref 65–99)
GLUCOSE-CAPILLARY: 231 mg/dL — AB (ref 65–99)
Glucose-Capillary: 180 mg/dL — ABNORMAL HIGH (ref 65–99)
Glucose-Capillary: 193 mg/dL — ABNORMAL HIGH (ref 65–99)
Glucose-Capillary: 221 mg/dL — ABNORMAL HIGH (ref 65–99)
Glucose-Capillary: 185 mg/dL — ABNORMAL HIGH (ref 65–99)

## 2016-06-19 MED ORDER — LISINOPRIL 20 MG PO TABS
20.0000 mg | ORAL_TABLET | Freq: Every day | ORAL | Status: DC
  Administered 2016-06-19 – 2016-06-22 (×4): 20 mg via ORAL
  Filled 2016-06-19 (×4): qty 1

## 2016-06-19 MED ORDER — DEXAMETHASONE 6 MG PO TABS
10.0000 mg | ORAL_TABLET | Freq: Four times a day (QID) | ORAL | Status: DC
  Administered 2016-06-19 – 2016-06-20 (×3): 10 mg via ORAL
  Filled 2016-06-19 (×4): qty 1

## 2016-06-19 MED ORDER — INSULIN GLARGINE 100 UNIT/ML ~~LOC~~ SOLN
7.0000 [IU] | Freq: Every day | SUBCUTANEOUS | Status: DC
  Administered 2016-06-19: 7 [IU] via SUBCUTANEOUS
  Filled 2016-06-19: qty 0.07

## 2016-06-19 NOTE — H&P (Signed)
PULMONARY / CRITICAL CARE MEDICINE   Name: Jaime [name] MRN: [id number] DOB: 1965-05-03    ADMISSION DATE:  06/17/2016 CONSULTATION DATE:  06/18/2016  REFERRING MD:  Dr. Blaine Hamper  CHIEF COMPLAINT:  Headache   HISTORY OF PRESENT ILLNESS:   51 year old female with PMH of DM, DUB, GERD, Hydradenitis, and Intellectual disability.   Presents to ED on 5/19 with reported 2 weeks of headache with nausea, vomiting, and sensitivity to light. Went to Urgent Care on 5/10 with left earache and on 5/12 with complaints of vomiting and stating that she was nervous that insects may be living in her head. Upon arrival to the ED patient has difficulty with speech and answering questions. CT revealed edema with midline shift that was concerning for mass, MRI confirmed non-enhancing tumor. Given decadron and neurosurgery consulted. PCCM asked to admit.   SUBJECTIVE:  NS evaluation No neurochanges  VITAL SIGNS: BP 131/90   Pulse 88   Temp 97.7 F (36.5 C) (Oral)   Resp 16   Ht 5\' 5"  (1.651 m)   Wt 74.5 kg (164 lb 3.9 oz)   LMP 10/31/2005   SpO2 100%   BMI 27.33 kg/m   HEMODYNAMICS:    VENTILATOR SETTINGS:    INTAKE / OUTPUT: I/O last 3 completed shifts: In: 2398.7 [P.O.:480; I.V.:1408.7; IV Piggyback:510] Out: 4332 [Urine:1650]  PHYSICAL EXAMINATION: General: awake, alert, cooperative, no distress Neuro: nonfocal examination, perr, slow to respond, did not assess gate HEENT: jvd wnl PULM: CTA CV:  s1 s2 RRR no r GI: soft,. Bs, no r/g Extremities: no edema   LABS:  BMET  Recent Labs Lab 06/17/16 2202 06/18/16 0745 06/18/16 1955  NA 138 136 139  K 2.8* 3.6 3.8  CL 95* 102 103  CO2 32 25 27  BUN <5* <5* 8  CREATININE 0.50 0.45 0.54  GLUCOSE 159* 212* 219*    Electrolytes  Recent Labs Lab 06/17/16 2202 06/18/16 0453 06/18/16 0745 06/18/16 1955  CALCIUM 9.4  --  8.8* 9.1  MG  --  1.4*  --   --     CBC  Recent Labs Lab 06/17/16 2202  WBC 6.7  HGB 10.8*  HCT  33.0*  PLT 306    Coag's  Recent Labs Lab 06/18/16 0453  APTT 28  INR 1.12    Sepsis Markers No results for input(s): LATICACIDVEN, PROCALCITON, O2SATVEN in the last 168 hours.  ABG No results for input(s): PHART, PCO2ART, PO2ART in the last 168 hours.  Liver Enzymes  Recent Labs Lab 06/17/16 2202  AST 15  ALT 9*  ALKPHOS 50  BILITOT 0.5  ALBUMIN 3.9    Cardiac Enzymes No results for input(s): TROPONINI, PROBNP in the last 168 hours.  Glucose  Recent Labs Lab 06/18/16 1142 06/18/16 1552 06/18/16 2002 06/18/16 2335 06/19/16 0323 06/19/16 0803  GLUCAP 147* 231* 203* 212* 180* 193*    Imaging No results found.   STUDIES:  CT Head 5/20 > Findings concerning for intra-axial mass in the central left occipital temporal lobe with large area of surrounding edema, there is a 12 mm left-to-right midline shift. Mass effect on the midbrain, basilar cisterns remains patent. Mild dilatation of the right lateral ventricle with question surrounding periventricular edema  MRI Brain 5/20 > Centrally necrotic, peripherally contrast-enhancing mass centered in the left temporal lobe is most consistent with a high-grade glioma. Surrounding hyperintense T2 weighted signal is likely a combination of vasogenic edema and non-enhancing tumor, 11 mm of rightward midline shift  with early subfalcine herniation of the left cingulate gyrus, midline shift is unchanged from earlier CT, mass effect on the left lateral ventricle with mildly dilated appearance of the right lateral ventricle.   CULTURES: U/A 5/19 > Large Leukocytes  Urine Culture 5/19 >>   ANTIBIOTICS: None.   SIGNIFICANT EVENTS: 5/19 > Presents to ED   LINES/TUBES: PIV  DISCUSSION: 51 year old female presents with 2 week reported headache found to have temporal lobe mass with 11 mm midline shift.   ASSESSMENT / PLAN:  PULMONARY A: At risk for inability to protect airway  P:   Monitor sats when ambulate  soon Maintain Saturation >92  CARDIOVASCULAR A:  H/O HTN P:  Cardiac Monitoring  Consider restart of home lisinopril / half dose PRN hydralazine for systolic >262  RENAL A:   Hypokalemia   P:   Re assess na in 1-2 days   GASTROINTESTINAL A:   GERD P:   PPI  While on steroids Diet advanced Monitor for dysphagia  HEMATOLOGIC A:  Higher risk fro DVT  P:  Add Heparin SQ until ambulation SCD  INFECTIOUS A:   No issues  P:   Follow temp curve  ENDOCRINE A:   DM   Worsening hyperglycemia steroids P:   SSI Add lantus  NEUROLOGIC A:   Temporal Lobe Mass with 11 mm shift , deep large, NS feels glio likely P:   RASS goal: 0 Neurosurgery note reviewed Monitor Neuro Checks to sdu has been stable neuro exam Keppra for seizure prophylaxis  Decadron 10 mg q6h, may reduce in am to 6 mg For tumor board assessment Will involve pall care     FAMILY  - Updates: Family updated at bedside in ED and pt  - Inter-disciplinary family meet or Palliative Care meeting due by: 06/24/2016   Lavon Paganini. Titus Mould, MD, [place] Pgr: [facility] 06/19/2016 8:34 AM

## 2016-06-19 NOTE — Consult Note (Signed)
Consultation Note Date: 06/19/2016   Patient Name: Jaime [name]  DOB: [birth year]  MRN: [id number]  Age / Sex: 51 y.o., female  PCP: Minus Liberty, MD Referring Physician: Marshell Garfinkel, MD  Reason for Consultation: Establishing goals of care and Psychosocial/spiritual support  HPI/Patient Profile: 51 y.o. female  admitted on 06/17/2016 with a large left temporal brain mass.  MRI confirmed an enhancing left temporal lesion with surrounding edema. Only symptoms were left sided headaches for a few weeks   She was admitted for further care.   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 It already enters the ventricle on the left and extends all the way to the brainstem and perimesencephalic cistern.   Gross total resection is probably not possible, especially without neurologic deficit.   The benefit of surgery would be gross debulking of the lesion and getting cells for pathologic diagnosis, but without gross total resection would probably not prolong survival and would put her at risk of neurologic deficit.    Dr Ronnald Ramp shared this information with family the potential benefits of palliative radiation therapy and oral Temodar.  Hospice was also discussed.  Family face treatment option decision and anticipatory care needs.  This is further complicated by fact that the patient herself lives on the autism spectrum and she doesn't understand the seriousness and complexity of the situation.    Clinical Assessment and Goals of Care:   This NP Wadie Lessen reviewed medical records, received report from team, assessed the patient and then meet at the patient's bedside along with her mother, brother and neice  to discuss diagnosis, prognosis, GOC, EOL wishes disposition and options.  A very basic explanation of current  situation was offered to the patient .  She is unable to grasp the severity and complexity of her medical situation.  Her mother tells me she is autistic and unable to make decisions for herself.  She has been dependant on her mother her whole life.  A  discussion was had today regarding advanced directives.  Concepts specific to code status, was had.  The difference between a aggressive medical intervention path  and a palliative comfort care path for this patient at this time was had.  Values and goals of care important to patient and family were attempted to be elicited.   Questions and concerns addressed.     Family encouraged to call with questions or concerns.    PMT will again tomorrow at 3:00 to further clarify plan moving forward.   NEXT OF KIN/mother is the main decision maker    SUMMARY OF RECOMMENDATIONS    Code Status/Advance Care Planning:  Full code- will discuss tomorrow at f/u meeting   Additional Recommendations (Limitations, Scope, Preferences):  At this time family is anticipating a conversation with a medical oncologist as well as a radiation oncologist.    I spoke to Dr Corrie Dandy regarding the situation.  They are open to  the possibility of radiation in hopes of prolonging quality life.  Psycho-social/Spiritual:    Additional Recommendations: Education on Hospice  Prognosis:   < 3 months  Discharge Planning: To Be Determined      Primary Diagnoses: Present on Admission: . Brain mass . Non-intractable vomiting with nausea . Hypertension . Diabetes mellitus type 2 with retinopathy ([place]) . HLD (hyperlipidemia) . GERD (gastroesophageal reflux disease)   I have reviewed the medical record, interviewed the patient and family, and examined the patient. The following aspects are pertinent.  Past Medical History:  Diagnosis Date  . Diabetes mellitus   . DUB (dysfunctional uterine bleeding)    total abdominal hysterectomy in 2007, ovaries intact  .  Furuncle of multiple sites    multiple I and D's  . GERD (gastroesophageal reflux disease)   . Hydradenitis   . Intellectual disability    Social History   Social History  . Marital status: Single    Spouse name: N/A  . Number of children: N/A  . Years of education: N/A   Social History Main Topics  . Smoking status: Never Smoker  . Smokeless tobacco: Never Used  . Alcohol use No  . Drug use: No  . Sexual activity: Not Currently    Birth control/ protection: Surgical   Other Topics Concern  . 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   No kids.   No sexual activity   Lives with her mother   Family History  Problem Relation Age of Onset  . Cancer Mother 72       Breast cancer  . Hypertension Mother   . Depression Mother   . Sickle cell trait Mother   . Cancer Father 24       lung cancer, was a long term smoker   Scheduled Meds: . dexamethasone  10 mg Intravenous Q6H  . fluticasone  2 spray Each Nare Daily  . heparin  5,000 Units Subcutaneous Q8H  . insulin aspart  0-15 Units Subcutaneous Q4H  . pravastatin  20 mg Oral QPM   Continuous Infusions: . sodium chloride 10 mL/hr at 06/18/16 1900  . sodium chloride    . famotidine (PEPCID) IV Stopped (06/18/16 2130)  . levETIRAcetam 500 mg (06/19/16 0504)   PRN Meds:.sodium chloride, hydrALAZINE, HYDROcodone-acetaminophen, ondansetron Medications Prior to Admission:  Prior to Admission medications   Medication Sig Start Date End Date Taking? Authorizing Provider  fluticasone (FLONASE) 50 MCG/ACT nasal spray Place 2 sprays into both nostrils daily. 06/08/16  Yes Barnet Glasgow, NP  lisinopril (PRINIVIL,ZESTRIL) 40 MG tablet Take 1 tablet (40 mg total) by mouth daily. 03/27/16 06/18/16 Yes Minus Liberty, MD  loratadine (CLARITIN) 10 MG tablet Take 1 tablet (10 mg total) by mouth daily. 06/08/16  Yes  Barnet Glasgow, NP  metFORMIN (GLUCOPHAGE) 1000 MG tablet Take 1 tablet (1,000 mg total) by mouth 2 (two) times daily with a meal. 12/27/15 06/18/16 Yes Minus Liberty, MD  montelukast (SINGULAIR) 10 MG tablet Take 1 tablet (10 mg total) by mouth at bedtime. 06/08/16  Yes Barnet Glasgow, NP  pravastatin (PRAVACHOL) 20 MG tablet TAKE 1 TABLET BY MOUTH EVERY EVENING 03/03/16  Yes Minus Liberty, MD  predniSONE (DELTASONE) 50 MG tablet Take 1 tablet (50 mg total) by mouth daily. 06/10/16  Yes Sherlene Shams, MD  ranitidine (ZANTAC) 300 MG tablet Take 1 tablet (300 mg total) by  mouth at bedtime. 06/08/16  Yes Barnet Glasgow, NP   No Known Allergies Review of Systems  All other systems reviewed and are negative.   Physical Exam  Constitutional: She appears well-developed.  HENT:  Mouth/Throat: Oropharynx is clear and moist.  Cardiovascular: Normal rate, regular rhythm and normal heart sounds.   Pulmonary/Chest: Effort normal and breath sounds normal.  Skin: Skin is warm and dry.    Vital Signs: BP 108/71   Pulse 79   Temp 97.7 F (36.5 C) (Oral)   Resp 15   Ht 5\' 5"  (1.651 m)   Wt 74.5 kg (164 lb 3.9 oz)   LMP 10/31/2005   SpO2 98%   BMI 27.33 kg/m  Pain Assessment: 0-10   Pain Score: 0-No pain   SpO2: SpO2: 98 % O2 Device:SpO2: 98 % O2 Flow Rate: .   IO: Intake/output summary:  Intake/Output Summary (Last 24 hours) at 06/19/16 0810 Last data filed at 06/19/16 4142  Gross per 24 hour  Intake           884.92 ml  Output             1250 ml  Net          -365.08 ml    LBM: Last BM Date: 06/17/16 Baseline Weight: Weight: 74.5 kg (164 lb 3.9 oz) Most recent weight: Weight: 74.5 kg (164 lb 3.9 oz)     Palliative Assessment/Data: 80%   Discussed with Dr Ronnald Ramp and Dr Corrie Dandy Re-meet tomorrow at 1500 with family  Time In: 1230 Time Out: 1345 Time Total: 75 min Greater than 50%  of this time was spent counseling and coordinating care related to the above  assessment and plan.  Signed by: Wadie Lessen, NP   Please contact Palliative Medicine Team phone at [phone] for questions and concerns.  For individual provider: See Shea Evans

## 2016-06-19 NOTE — Progress Notes (Signed)
eLink Physician-Brief Progress Note Patient Name: Jaime [name] DOB: July 08, 1965 MRN: [id number]   Date of Service  06/19/2016  HPI/Events of Note  Palliative care team relayed neurosurgery's recommendation for patient to have medical oncology as well as radiation oncology referral.   eICU Interventions  Will let PCCM team know.      Intervention Category Evaluation Type: Other  [place] 06/19/2016, 4:13 PM

## 2016-06-19 NOTE — Progress Notes (Signed)
Pt emotional and states she does not know why the doctors talking to her. She states she just came to the hospital for her allergies and she does not understand. She states she cannot read and that she is handicapped. Emotional support given to the pt and pt is told that I will pass along her concerns. Will continue to monitor.

## 2016-06-19 NOTE — Consult Note (Signed)
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 Patient states that she has had allergy, went to urgent care on 5/10 and 5/12 for headache and nausea. She presented to ED yesterday for similar symptoms, she was found to have some difficulty with speech and answering questions. CT and MRI of head was obtained which showed Jaime 6.0 x 4.8 x 3.0 cm in the left temporal lobe with central necrosis and the peripheral enhancement. There is vasogenic edema, and 11 mm midline shift with subfalcine herniation of the left cingulate gyrus. The imaging finding is most consistent with glioblastoma. She was seen by neurosurgeon Dr. Ronnald Ramp, who felt her tumor is not resectable due to the location, and recommended palliative radiation and chemotherapy. Patient has been doing well in the hospital, ambulatory without difficulty, denies any significant pain or other symptoms. I was called today to discuss palliative treatment for her brian tumor.   I spoke with pt's mother also today, who is her care giver and lives with her.   MEDICAL HISTORY:  Past Medical History:  Diagnosis Date  . Diabetes mellitus   . DUB (dysfunctional uterine bleeding)    total abdominal hysterectomy in 2007, ovaries intact  . Furuncle of multiple sites    multiple I and D's  . GERD (gastroesophageal reflux disease)   . Hydradenitis   . Intellectual disability     SURGICAL HISTORY: Past Surgical History:  Procedure Laterality Date  . ABDOMINAL HYSTERECTOMY    . HYDRADENITIS EXCISION    . TONSILLECTOMY AND ADENOIDECTOMY     in early  childhood    SOCIAL HISTORY: Social History   Social History  . Marital status: Single    Spouse name: N/Jaime  . Number of children: N/Jaime  . Years of education: N/Jaime   Occupational History  . Not on file.   Social History Main Topics  . Smoking status: Never Smoker  . Smokeless tobacco: Never Used  . Alcohol use No  . Drug use: No  . Sexual activity: Not Currently    Birth control/ protection: Surgical   Other Topics Concern  . 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   No kids.   No sexual activity   Lives with her mother    FAMILY HISTORY: Family History  Problem Relation Age of Onset  . Cancer Mother 78       Breast cancer  . Hypertension Mother   . Depression Mother   . Sickle cell trait Mother   . Cancer Father 58       lung cancer, was Jaime long term smoker    ALLERGIES:  has No Known Allergies.  MEDICATIONS:  Current Facility-Administered Medications  Medication Dose Route Frequency Provider Last Rate Last Dose  . 0.9 %  sodium chloride infusion   Intravenous Continuous Raylene Miyamoto, MD 10 mL/hr at 06/18/16 1900    . 0.9 %  sodium chloride infusion  250 mL Intravenous  PRN Omar Person, NP      . dexamethasone (DECADRON) tablet 10 mg  10 mg Oral Q6H Eustace Moore, MD   10 mg at 06/19/16 1718  . famotidine (PEPCID) IVPB 20 mg premix  20 mg Intravenous Q12H Ivor Costa, MD   Stopped at 06/19/16 1045  . fluticasone (FLONASE) 50 MCG/ACT nasal spray 2 spray  2 spray Each Nare Daily Ivor Costa, MD   2 spray at 06/19/16 1015  . heparin injection 5,000 Units  5,000 Units Subcutaneous Q8H Omar Person, NP   5,000 Units at 06/19/16 1447  . hydrALAZINE (APRESOLINE) injection 5 mg  5 mg Intravenous Q6H PRN Omar Person, NP      . HYDROcodone-acetaminophen (NORCO/VICODIN) 5-325 MG per tablet 1-2 tablet  1-2 tablet Oral Q6H PRN  Eustace Moore, MD   1 tablet at 06/19/16 0840  . insulin aspart (novoLOG) injection 0-15 Units  0-15 Units Subcutaneous Q4H Raylene Miyamoto, MD   3 Units at 06/19/16 1718  . insulin glargine (LANTUS) injection 7 Units  7 Units Subcutaneous QHS Corey Harold, NP      . levETIRAcetam (KEPPRA) 500 mg in sodium chloride 0.9 % 100 mL IVPB  500 mg Intravenous Q12H Omar Person, NP 420 mL/hr at 06/19/16 0504 500 mg at 06/19/16 0504  . lisinopril (PRINIVIL,ZESTRIL) tablet 20 mg  20 mg Oral Daily Corey Harold, NP   20 mg at 06/19/16 1447  . ondansetron (ZOFRAN) injection 4 mg  4 mg Intravenous Q8H PRN Ivor Costa, MD      . pravastatin (PRAVACHOL) tablet 20 mg  20 mg Oral QPM Omar Person, NP   20 mg at 06/19/16 1718    REVIEW OF SYSTEMS:   Constitutional: Denies fevers, chills or abnormal night sweats Eyes: Denies blurriness of vision, double vision or watery eyes Ears, nose, mouth, throat, and face: Denies mucositis or sore throat Respiratory: Denies cough, dyspnea or wheezes Cardiovascular: Denies palpitation, chest discomfort or lower extremity swelling Gastrointestinal:  Denies nausea, heartburn or change in bowel habits Skin: Denies abnormal skin rashes Lymphatics: Denies new lymphadenopathy or easy bruising Neurological:Denies numbness, tingling or new weaknesses (+) headache, some difficulty in answering questions, and organize her thoughts, probably related to her underline autism. Behavioral/Psych: Mood is stable, no new changes  All other systems were reviewed with the patient and are negative.  PHYSICAL EXAMINATION: ECOG PERFORMANCE STATUS: 1 - Symptomatic but completely ambulatory  Vitals:   06/19/16 1500 06/19/16 1616  BP:    Pulse: 98 88  Resp:  19  Temp:  98.8 F (37.1 C)   Filed Weights   06/18/16 0702  Weight: 164 lb 3.9 oz (74.5 kg)    GENERAL:alert, no distress and comfortable SKIN: skin color, texture, turgor are normal, no rashes or  significant lesions EYES: normal, conjunctiva are pink and non-injected, sclera clear OROPHARYNX:no exudate, no erythema and lips, buccal mucosa, and tongue normal  NECK: supple, thyroid normal size, non-tender, without nodularity LYMPH:  no palpable lymphadenopathy in the cervical, axillary or inguinal LUNGS: clear to auscultation and percussion with normal breathing effort HEART: regular rate & rhythm and no murmurs and no lower extremity edema ABDOMEN:abdomen soft, non-tender and normal bowel sounds Musculoskeletal:no cyanosis of digits and no clubbing  PSYCH: alert & oriented x 3 with fluent speech NEURO: no focal motor/sensory deficits, she is able to walk independently without gait disturbance   LABORATORY DATA:  I have reviewed the data as listed  Lab Results  Component Value Date   WBC 9.7 06/19/2016   HGB 11.4 (L) 06/19/2016   HCT 34.6 (L) 06/19/2016   MCV 83.6 06/19/2016   PLT 330 06/19/2016    Recent Labs  06/17/16 2202 06/18/16 0745 06/18/16 1955  NA 138 136 139  K 2.8* 3.6 3.8  CL 95* 102 103  CO2 32 25 27  GLUCOSE 159* 212* 219*  BUN <5* <5* 8  CREATININE 0.50 0.45 0.54  CALCIUM 9.4 8.8* 9.1  GFRNONAA >60 >60 >60  GFRAA >60 >60 >60  PROT 7.3  --   --   ALBUMIN 3.9  --   --   AST 15  --   --   ALT 9*  --   --   ALKPHOS 50  --   --   BILITOT 0.5  --   --     RADIOGRAPHIC STUDIES: I have personally reviewed the radiological images as listed and agreed with the findings in the report. Ct Head Wo Contrast  Result Date: 06/18/2016 CLINICAL DATA:  Headache and vomiting. EXAM: CT HEAD WITHOUT CONTRAST TECHNIQUE: Contiguous axial images were obtained from the base of the skull through the vertex without intravenous contrast. COMPARISON:  None. FINDINGS: Brain: Large area of low-density within the left temporal and occipital lobe consistent with edema, probable central mass lesion. There is left right midline shift of 12 mm, with mild dilatation of the right  lateral ventricle. Minimal periventricular white matter changes about the right lateral ventricle. Mild mass effect on the midbrain, the basilar cisterns remain patent. No evidence of hemorrhage. Vascular: No hyperdense vessel or unexpected calcification. Skull: No fracture or focal lesion. Sinuses/Orbits: Paranasal sinuses and mastoid air cells are clear. The visualized orbits are unremarkable. Other: None. IMPRESSION: Findings concerning for intra-axial mass in the central left occipital temporal lobe with large area of surrounding edema. There is 12 mm left-to-right midline shift. Mass effect on the midbrain, however basilar cisterns remain patent. Mild dilatation of the right lateral ventricle with question surrounding periventricular edema. Recommend MRI with contrast for characterization. These results were called by telephone at the time of interpretation on 06/18/2016 at 1:08 am to Dr. Thayer Jew , who verbally acknowledged these results. Electronically Signed   By: Jeb Levering M.D.   On: 06/18/2016 01:08   Mr Brain W And Wo Contrast  Result Date: 06/18/2016 CLINICAL DATA:  Headaches and increasing confusion. Suspected mass on CT EXAM: MRI HEAD WITHOUT AND WITH CONTRAST TECHNIQUE: Multiplanar, multiecho pulse sequences of the brain and surrounding structures were obtained without and with intravenous contrast. CONTRAST:  18mL MULTIHANCE GADOBENATE DIMEGLUMINE 529 MG/ML IV SOLN COMPARISON:  Head CT 06/18/2016 FINDINGS: Brain: The midline structures are normal. There is no focal diffusion restriction to indicate acute infarct. There is Jaime peripherally contrast-enhancing mass centered in the left temporal lobe that measures 6.0 x 4.8 x 3.0 cm (AP x Transverse x CC). Surrounding hyperintense T2 weighted signal extends into the right frontal, parietal, temporal and occipital white matter. There is mass effect with rightward midline shift measuring 11 mm at the level of the foramina of [place]. There is  early subfalcine herniation of the cingulate gyrus. Areas of susceptibility within the mass may indicate foci of remote hemorrhage. No intraparenchymal hematoma. Mass effect on the left lateral ventricle with mild dilatation of the right lateral ventricle, but no current evidence of ventricular trapping. Basal cisterns remain evident. The dura is normal and there is no extra-axial collection. Vascular: Major  intracranial arterial and venous sinus flow voids are preserved. Skull and upper cervical spine: The visualized skull base, calvarium, upper cervical spine and extracranial soft tissues are normal. Sinuses/Orbits: No fluid levels or advanced mucosal thickening. No mastoid effusion. Normal orbits. IMPRESSION: 1. Centrally necrotic, peripherally contrast-enhancing mass centered in the left temporal lobe is most consistent with Jaime high-grade glioma. Surrounding hyperintense T2 weighted signal is likely Jaime combination of vasogenic edema and nonenhancing tumor. No satellite lesions. 2. 11 mm of rightward midline shift with early subfalcine herniation of the left cingulate gyrus. Midline shift is unchanged from earlier head CT. 3. Mass effect on the left lateral ventricle with mildly dilated appearance of the right lateral ventricle. The patient is at risk for right lateral ventricular trapping, though this has not occurred yet. Electronically Signed   By: Ulyses Jarred M.D.   On: 06/18/2016 03:14    ASSESSMENT & PLAN:  51 yo female with PMH of DM, HTN, autism, who presents with headaches for Jaime few weeks. MRI brain showed Jaime 6.0 x 4.8 x 3.0 cm in the left temporal lobe with central necrosis and the peripheral enhancement. There is vasogenic edema, and 11 mm midline shift with subfalcine herniation of the left cingulate gyrus.  1. Left temporal lobe brain tumor with vasogenic edema and midline shift, most consistent with glioblastoma. 2. DM 3. HTN 4. autism   Recommendations: -I have reviewed her brian MRI scan  and discussed the findings with pt and her mother -I agree this is most consistent with GBM, but will recommend Jaime CT chest, abdomen and pelvis with contrast to rule out other primary with brian metastasis -Unfortunately we are not able to get Jaime tissue diagnosis given the difficulty of the brain tumor biopsy -Her surgeon Dr. Ronnald Ramp has recommended not to have surgery due to the high-risk of operation -I recommend palliative radiation with oral temodar. Given her young age and good PS, she is Jaime candidate for treatment -I discussed the goal of therapy is palliative, and her overall prognosis is very poor -I don't think pt grasps the severity and complexity of her oncological issue. I have talked to her mother who is her decision maker and care giver, she agrees with the treatment plan. I hope to have Jaime chance to meet pt's mother in person to go over more details about her prognosis and treatment plan.  -I have sent Jaime message to RAD/ONC to see her probably tomorrow.  -although her MRI did show significant midline shift due to the tumor, with subfalcine herniation, she is clinically doing well. Her mother seems to be reliable to bring her to our cancer center for appointments.   -will discuss with rad/onc and make Jaime final decision about her treatment schedule, and discharge plan.    All questions were answered. The patient knows to call the clinic with any problems, questions or concerns.      Truitt Merle, MD 06/19/2016 5:24 PM

## 2016-06-19 NOTE — Progress Notes (Signed)
Patient ID: Jaime [name], female   DOB: 16-Feb-1965, 51 y.o.   MRN: [id number] Subjective: Patient reports she feels good  Objective: Vital signs in last 24 hours: Temp:  [97.7 F (36.5 C)-99 F (37.2 C)] 98.4 F (36.9 C) (05/21 0800) Pulse Rate:  [74-116] 91 (05/21 1000) Resp:  [0-29] 24 (05/21 1000) BP: (108-140)/(71-101) 114/78 (05/21 0900) SpO2:  [97 %-100 %] 99 % (05/21 1000)  Intake/Output from previous day: 05/20 0701 - 05/21 0700 In: 1064.9 [P.O.:480; I.V.:274.9; IV Piggyback:310] Out: 1250 [Urine:1250] Intake/Output this shift: Total I/O In: 560 [P.O.:240; I.V.:170; IV Piggyback:150] Out: -   Neurologic: Grossly normal  Lab Results: Lab Results  Component Value Date   WBC 9.7 06/19/2016   HGB 11.4 (L) 06/19/2016   HCT 34.6 (L) 06/19/2016   MCV 83.6 06/19/2016   PLT 330 06/19/2016   Lab Results  Component Value Date   INR 1.12 06/18/2016   BMET Lab Results  Component Value Date   NA 139 06/18/2016   K 3.8 06/18/2016   CL 103 06/18/2016   CO2 27 06/18/2016   GLUCOSE 219 (H) 06/18/2016   BUN 8 06/18/2016   CREATININE 0.54 06/18/2016   CALCIUM 9.1 06/18/2016    Studies/Results: Ct Head Wo Contrast  Result Date: 06/18/2016 CLINICAL DATA:  Headache and vomiting. EXAM: CT HEAD WITHOUT CONTRAST TECHNIQUE: Contiguous axial images were obtained from the base of the skull through the vertex without intravenous contrast. COMPARISON:  None. FINDINGS: Brain: Large area of low-density within the left temporal and occipital lobe consistent with edema, probable central mass lesion. There is left right midline shift of 12 mm, with mild dilatation of the right lateral ventricle. Minimal periventricular white matter changes about the right lateral ventricle. Mild mass effect on the midbrain, the basilar cisterns remain patent. No evidence of hemorrhage. Vascular: No hyperdense vessel or unexpected calcification. Skull: No fracture or focal lesion. Sinuses/Orbits: Paranasal  sinuses and mastoid air cells are clear. The visualized orbits are unremarkable. Other: None. IMPRESSION: Findings concerning for intra-axial mass in the central left occipital temporal lobe with large area of surrounding edema. There is 12 mm left-to-right midline shift. Mass effect on the midbrain, however basilar cisterns remain patent. Mild dilatation of the right lateral ventricle with question surrounding periventricular edema. Recommend MRI with contrast for characterization. These results were called by telephone at the time of interpretation on 06/18/2016 at 1:08 am to Dr. Thayer Jew , who verbally acknowledged these results. Electronically Signed   By: Jeb Levering M.D.   On: 06/18/2016 01:08   Mr Brain W And Wo Contrast  Result Date: 06/18/2016 CLINICAL DATA:  Headaches and increasing confusion. Suspected mass on CT EXAM: MRI HEAD WITHOUT AND WITH CONTRAST TECHNIQUE: Multiplanar, multiecho pulse sequences of the brain and surrounding structures were obtained without and with intravenous contrast. CONTRAST:  54mL MULTIHANCE GADOBENATE DIMEGLUMINE 529 MG/ML IV SOLN COMPARISON:  Head CT 06/18/2016 FINDINGS: Brain: The midline structures are normal. There is no focal diffusion restriction to indicate acute infarct. There is a peripherally contrast-enhancing mass centered in the left temporal lobe that measures 6.0 x 4.8 x 3.0 cm (AP x Transverse x CC). Surrounding hyperintense T2 weighted signal extends into the right frontal, parietal, temporal and occipital white matter. There is mass effect with rightward midline shift measuring 11 mm at the level of the foramina of [place]. There is early subfalcine herniation of the cingulate gyrus. Areas of susceptibility within the mass may indicate foci of remote hemorrhage. No  intraparenchymal hematoma. Mass effect on the left lateral ventricle with mild dilatation of the right lateral ventricle, but no current evidence of ventricular trapping. Basal  cisterns remain evident. The dura is normal and there is no extra-axial collection. Vascular: Major intracranial arterial and venous sinus flow voids are preserved. Skull and upper cervical spine: The visualized skull base, calvarium, upper cervical spine and extracranial soft tissues are normal. Sinuses/Orbits: No fluid levels or advanced mucosal thickening. No mastoid effusion. Normal orbits. IMPRESSION: 1. Centrally necrotic, peripherally contrast-enhancing mass centered in the left temporal lobe is most consistent with a high-grade glioma. Surrounding hyperintense T2 weighted signal is likely a combination of vasogenic edema and nonenhancing tumor. No satellite lesions. 2. 11 mm of rightward midline shift with early subfalcine herniation of the left cingulate gyrus. Midline shift is unchanged from earlier head CT. 3. Mass effect on the left lateral ventricle with mildly dilated appearance of the right lateral ventricle. The patient is at risk for right lateral ventricular trapping, though this has not occurred yet. Electronically Signed   By: Ulyses Jarred M.D.   On: 06/18/2016 03:14    Assessment/Plan: Most likely GBM, discussed at [place] board today, surgery likely carries more risks than benefits (I cannot get a gross total resection). I rec palliative XRT and oral chemo. I would consider tissue biopsy if onc feels it is absolutely necessary   LOS: 1 day    Shterna Laramee S 06/19/2016, 1:16 PM

## 2016-06-20 ENCOUNTER — Inpatient Hospital Stay (HOSPITAL_COMMUNITY): Payer: Medicaid Other

## 2016-06-20 ENCOUNTER — Encounter (HOSPITAL_COMMUNITY): Payer: Self-pay | Admitting: Radiology

## 2016-06-20 ENCOUNTER — Ambulatory Visit
Admit: 2016-06-20 | Discharge: 2016-06-20 | Disposition: A | Discharge status: Home / Self Care | Payer: Medicaid Other | Source: Ambulatory Visit | Attending: Radiation Oncology | Admitting: Radiation Oncology

## 2016-06-20 DIAGNOSIS — C712 Malignant neoplasm of temporal lobe: Secondary | ICD-10-CM | POA: Insufficient documentation

## 2016-06-20 DIAGNOSIS — E119 Type 2 diabetes mellitus without complications: Secondary | ICD-10-CM | POA: Insufficient documentation

## 2016-06-20 DIAGNOSIS — Z515 Encounter for palliative care: Secondary | ICD-10-CM

## 2016-06-20 DIAGNOSIS — Z801 Family history of malignant neoplasm of trachea, bronchus and lung: Secondary | ICD-10-CM | POA: Insufficient documentation

## 2016-06-20 DIAGNOSIS — Z90710 Acquired absence of both cervix and uterus: Secondary | ICD-10-CM | POA: Insufficient documentation

## 2016-06-20 DIAGNOSIS — K219 Gastro-esophageal reflux disease without esophagitis: Secondary | ICD-10-CM | POA: Insufficient documentation

## 2016-06-20 DIAGNOSIS — Z7982 Long term (current) use of aspirin: Secondary | ICD-10-CM | POA: Insufficient documentation

## 2016-06-20 DIAGNOSIS — Z803 Family history of malignant neoplasm of breast: Secondary | ICD-10-CM | POA: Insufficient documentation

## 2016-06-20 DIAGNOSIS — D432 Neoplasm of uncertain behavior of brain, unspecified: Secondary | ICD-10-CM

## 2016-06-20 DIAGNOSIS — Z794 Long term (current) use of insulin: Secondary | ICD-10-CM | POA: Insufficient documentation

## 2016-06-20 DIAGNOSIS — Z79899 Other long term (current) drug therapy: Secondary | ICD-10-CM | POA: Insufficient documentation

## 2016-06-20 DIAGNOSIS — Z510 Encounter for antineoplastic radiation therapy: Secondary | ICD-10-CM | POA: Insufficient documentation

## 2016-06-20 DIAGNOSIS — Z8249 Family history of ischemic heart disease and other diseases of the circulatory system: Secondary | ICD-10-CM | POA: Insufficient documentation

## 2016-06-20 DIAGNOSIS — F40240 Claustrophobia: Secondary | ICD-10-CM | POA: Insufficient documentation

## 2016-06-20 DIAGNOSIS — Z818 Family history of other mental and behavioral disorders: Secondary | ICD-10-CM | POA: Insufficient documentation

## 2016-06-20 LAB — GLUCOSE, CAPILLARY
GLUCOSE-CAPILLARY: 216 mg/dL — AB (ref 65–99)
GLUCOSE-CAPILLARY: 220 mg/dL — AB (ref 65–99)
Glucose-Capillary: 162 mg/dL — ABNORMAL HIGH (ref 65–99)
Glucose-Capillary: 183 mg/dL — ABNORMAL HIGH (ref 65–99)
Glucose-Capillary: 251 mg/dL — ABNORMAL HIGH (ref 65–99)

## 2016-06-20 LAB — BASIC METABOLIC PANEL
Anion gap: 9 (ref 5–15)
BUN: 11 mg/dL (ref 6–20)
CO2: 26 mmol/L (ref 22–32)
CREATININE: 0.46 mg/dL (ref 0.44–1.00)
Calcium: 9.4 mg/dL (ref 8.9–10.3)
Chloride: 105 mmol/L (ref 101–111)
GFR calc Af Amer: >60 mL/min (ref >60)
Glucose, Bld: 170 mg/dL — ABNORMAL HIGH (ref 65–99)
Potassium: 3.7 mmol/L (ref 3.5–5.1)
Sodium: 140 mmol/L (ref 135–145)

## 2016-06-20 LAB — CBC
HCT: 32.6 % — ABNORMAL LOW (ref 36.0–46.0)
Hemoglobin: 10.7 g/dL — ABNORMAL LOW (ref 12.0–15.0)
MCH: 27.3 pg (ref 26.0–34.0)
MCHC: 32.8 g/dL (ref 30.0–36.0)
MCV: 83.2 fL (ref 78.0–100.0)
PLATELETS: 297 10*3/uL (ref 150–400)
RBC: 3.92 MIL/uL (ref 3.87–5.11)
RDW: 13.8 % (ref 11.5–15.5)
WBC: 9.5 10*3/uL (ref 4.0–10.5)

## 2016-06-20 MED ORDER — IOPAMIDOL (ISOVUE-300) INJECTION 61%
INTRAVENOUS | Status: AC
  Administered 2016-06-20: 30 mL via ORAL
  Filled 2016-06-20: qty 30

## 2016-06-20 MED ORDER — LEVETIRACETAM 500 MG PO TABS
500.0000 mg | ORAL_TABLET | Freq: Two times a day (BID) | ORAL | Status: DC
  Administered 2016-06-20 – 2016-06-22 (×5): 500 mg via ORAL
  Filled 2016-06-20 (×5): qty 1

## 2016-06-20 MED ORDER — DEXAMETHASONE 4 MG PO TABS
6.0000 mg | ORAL_TABLET | Freq: Four times a day (QID) | ORAL | Status: DC
  Administered 2016-06-20 – 2016-06-22 (×10): 6 mg via ORAL
  Filled 2016-06-20 (×11): qty 1

## 2016-06-20 MED ORDER — IOPAMIDOL (ISOVUE-300) INJECTION 61%
INTRAVENOUS | Status: AC
  Administered 2016-06-20: 100 mL
  Filled 2016-06-20: qty 100

## 2016-06-20 MED ORDER — FAMOTIDINE 20 MG PO TABS
20.0000 mg | ORAL_TABLET | Freq: Two times a day (BID) | ORAL | Status: DC
  Administered 2016-06-20 – 2016-06-22 (×5): 20 mg via ORAL
  Filled 2016-06-20 (×5): qty 1

## 2016-06-20 MED ORDER — INSULIN GLARGINE 100 UNIT/ML ~~LOC~~ SOLN
9.0000 [IU] | Freq: Every day | SUBCUTANEOUS | Status: DC
  Administered 2016-06-20: 9 [IU] via SUBCUTANEOUS
  Filled 2016-06-20 (×3): qty 0.09

## 2016-06-20 NOTE — Progress Notes (Signed)
Medicine attending transfer note: I personally interviewed and examined this patient and reviewed pertinent clinical, laboratory, and imaging data, and I concur with the evaluation and management plan as recorded by resident physician Dr. [name].  Clinical summary: 51 year old woman with hypertension, type 2 diabetes on oral agent, and autism.  She lives with her mother who is a former breast cancer patient of mine.  She was admitted on May 19 with a 2 week history of headaches, nausea, vomiting, left ear pain and photophobia.  She attributes her symptoms to her allergies.  On initial presentation to the emergency department on May 10, symptoms nonspecific, no neurologic signs at that time.  Her antihistamines were refilled.  Zantac started for reflux symptoms.  She returned to the ED on May 19 with progressive headaches and persistent emesis.  She was having hallucinations.  Although she appeared alert and oriented, there were subtle changes in her speech pattern which was not fluent and some of her answers to questions were inappropriate.  CT was obtained and showed a left occipital temporal lobe mass with surrounding edema.  MRI confirmed showing a 6 x 4.8 x 3 cm ring-enhancing lesion in the left temporal lobe with extension into the right frontal, parietal, temporal, and occipital white matter with mass-effect and an 11 mm midline shift and presence of early herniation.  Radiographic characteristics concerning for primary glioblastoma brain cancer.  She was started on high-dose steroids.  Admitted to the intensive care unit.  Evaluated by neurosurgery.  Lesion not felt to be surgically resectable.  Her symptoms have stabilized on the high-dose Decadron.  She was seen in consultation by medical oncology.  The patient has limited comprehension of detailed medical information given her underlying autism.  Palliative radiation with oral Temodar chemotherapy recommended.  Oncologist discussed her management  by phone with the patient's mother and I discussed it with the mother again in person today.  She is now transferred to the medical service pending further disposition.  She has had significant resolution of her initial symptoms.  Current exam: Pleasant, alert, oriented, African-American woman.  She states her headaches are minimal at present.  Denies any visual difficulties.  Her speech is fluent.  Pupils equal round reactive to light.  Full extraocular movements.  Palate elevates symmetrically.  Tongue midline.  No facial asymmetry.  Neck is supple.  Motor strength is 5/5 all extremities.  Reflexes 2+ symmetric upper and lower.  Upper body coordination normal.  Gait not tested.  Impression: 1.  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  This has modestly improved survival.  More so in patients whose tumors overexpress MGMT, methyl 1 in methyl transferase enzyme.  Duration of control following gross subtotal resection is approximately 24 months.  Less predictable when the primary cannot be surgically removed.  Salvage therapy includes antiangiogenesis drug such as bevacizumab. I discussed with the patient's mother that this is a difficult tumor to treat but we can improve survival and maintain quality for a number of months and sometimes years.  Her daughter has limited ability to understand but I told her mom if we present the situation and simple terms I think that she will have enough comprehension to be able to comply with treatments.  2.  Essential hypertension.  Blood pressure currently stable and in good range on Prinivil alone.  Monitor for any acute changes in view of possibility of intracranial hypertension.  3.  Type 2 diabetes on oral agent.  Getting insulin in the hospital.  Anticipate elevation of glucose above baseline due to steroids.

## 2016-06-20 NOTE — Consult Note (Addendum)
Radiation Oncology         [phone] ________________________________  Name: Jaime [name] MRN: [id number]  Date: 06/20/16  DOB: Dec 21, 1965  CC:O'Sullivan, Rodman Key, MD  No ref. provider found     REFERRING PHYSICIAN: No ref. provider found   DIAGNOSIS: The primary encounter diagnosis was Brain tumor [facility]). Diagnoses of Intractable headache, unspecified chronicity pattern, unspecified headache type and Non-intractable vomiting with nausea, unspecified vomiting type were also pertinent to this visit.   HISTORY OF PRESENT ILLNESS: Jaime [name] is a 51 y.o. female seen at the request of Dr. Ronnald Ramp for a new putative diagnosis of Glioblastoma multiforme. Jaime [name] was doing well up until a few weeks ago when she started having  progressive headaches, dizziness, and intractable nausea and vomiting. She presented to an urgent care and was sent for ED evaluation on 06/17/16 where a CT scan of the brain revealed a midline shift, and an MRI on 06/18/16 revealed a 6 x 4.8 x 3 cm mass in the left temporal lobe with mass effect and rightward midline shift measuring 11 mc with early subfalcine herniation. Mass effect on the left lateral ventricle with mildly dialated appearance of the right lateral ventricle was noted and the mass was centrally necrotic, peripherally enhancing with surrounding vasogenic edema concerning for GBM. The patient and her family have met with Dr. Ronnald Ramp in Neurosurgery, and he has recommended against surgical resection as he's concerned that this would not be a gross total resection, and could leave the patient with more deficits than what she's currently experiencing. She was presented in our brain and spine oncology conference on Monday, and recommendations have been made to forgo additional tissue sampling due to associated risks, and to offer the patient a shorter course of radiotherapy with concurrent temodar. The patient has a cognitive delay and does not make her own medical  decisions. I met with the patient's mother, aunt, niece, and brother today in a family conference with palliative care. She will be undergoing a CT of the chest/abdomen/pelvis to rule out other possibilities that her brain disease stems from another origin.  PREVIOUS RADIATION THERAPY: No   PAST MEDICAL HISTORY:  Past Medical History:  Diagnosis Date  . Diabetes mellitus   . DUB (dysfunctional uterine bleeding)    total abdominal hysterectomy in 2007, ovaries intact  . Furuncle of multiple sites    multiple I and D's  . GERD (gastroesophageal reflux disease)   . Hydradenitis   . Intellectual disability        PAST SURGICAL HISTORY: Past Surgical History:  Procedure Laterality Date  . ABDOMINAL HYSTERECTOMY    . HYDRADENITIS EXCISION    . TONSILLECTOMY AND ADENOIDECTOMY     in early childhood     FAMILY HISTORY:  Family History  Problem Relation Age of Onset  . Cancer Mother 50       Breast cancer  . Hypertension Mother   . Depression Mother   . Sickle cell trait Mother   . Cancer Father 66       lung cancer, was a long term smoker     SOCIAL HISTORY:  reports that she has never smoked. She has never used smokeless tobacco. She reports that she does not drink alcohol or use drugs. The patient is single. She lives in [place] at home with her mother. She works part time as a Sports coach, and completed her high school education. She enjoys playing on the computer.   ALLERGIES: Patient has no  known allergies.   MEDICATIONS:  Current Facility-Administered Medications  Medication Dose Route Frequency Provider Last Rate Last Dose  . 0.9 %  sodium chloride infusion   Intravenous Continuous Raylene Miyamoto, MD 10 mL/hr at 06/19/16 1840    . 0.9 %  sodium chloride infusion  250 mL Intravenous PRN Omar Person, NP      . dexamethasone (DECADRON) tablet 10 mg  10 mg Oral Q6H Eustace Moore, MD   10 mg at 06/20/16 0524  . famotidine (PEPCID) IVPB 20 mg premix  20  mg Intravenous Q12H Ivor Costa, MD   Stopped at 06/19/16 2330  . fluticasone (FLONASE) 50 MCG/ACT nasal spray 2 spray  2 spray Each Nare Daily Ivor Costa, MD   2 spray at 06/19/16 1015  . heparin injection 5,000 Units  5,000 Units Subcutaneous Q8H Omar Person, NP   5,000 Units at 06/20/16 0524  . hydrALAZINE (APRESOLINE) injection 5 mg  5 mg Intravenous Q6H PRN Omar Person, NP      . HYDROcodone-acetaminophen (NORCO/VICODIN) 5-325 MG per tablet 1-2 tablet  1-2 tablet Oral Q6H PRN Eustace Moore, MD   1 tablet at 06/19/16 0840  . insulin aspart (novoLOG) injection 0-15 Units  0-15 Units Subcutaneous Q4H Raylene Miyamoto, MD   3 Units at 06/20/16 0335  . insulin glargine (LANTUS) injection 7 Units  7 Units Subcutaneous QHS Corey Harold, NP   7 Units at 06/19/16 2259  . levETIRAcetam (KEPPRA) 500 mg in sodium chloride 0.9 % 100 mL IVPB  500 mg Intravenous Q12H Omar Person, NP 420 mL/hr at 06/20/16 0524 500 mg at 06/20/16 0524  . lisinopril (PRINIVIL,ZESTRIL) tablet 20 mg  20 mg Oral Daily Corey Harold, NP   20 mg at 06/19/16 1447  . ondansetron (ZOFRAN) injection 4 mg  4 mg Intravenous Q8H PRN Ivor Costa, MD      . pravastatin (PRAVACHOL) tablet 20 mg  20 mg Oral QPM Omar Person, NP   20 mg at 06/19/16 1718     REVIEW OF SYSTEMS: Upon meeting the patient she reports she's doing well and is ready to go downstairs for her CT scan. The remainder of the time was spent in family conference.     PHYSICAL EXAM:  Wt Readings from Last 3 Encounters:  06/18/16 164 lb 3.9 oz (74.5 kg)  02/10/16 181 lb (82.1 kg)  12/27/15 181 lb 6.4 oz (82.3 kg)   Temp Readings from Last 3 Encounters:  06/20/16 98.3 F (36.8 C) (Oral)  06/10/16 98.6 F (37 C) (Oral)  06/08/16 98.6 F (37 C) (Oral)   BP Readings from Last 3 Encounters:  06/20/16 133/87  06/10/16 115/64  06/08/16 122/74   Pulse Readings from Last 3 Encounters:  06/20/16 62  06/10/16 (!) 106  06/08/16  (!) 109   Pain Assessment Pain Score: 0-No pain/10  In general this is a well appearing African American female in no acute distress. She's alert and oriented x4 and appropriate throughout the examination. Cardiopulmonary assessment is negative for acute distress and she exhibits normal effort.    ECOG = 2  0 - Asymptomatic (Fully active, able to carry on all predisease activities without restriction)  1 - Symptomatic but completely ambulatory (Restricted in physically strenuous activity but ambulatory and able to carry out work of a light or sedentary nature. For example, light housework, office work)  2 - Symptomatic, <50% in bed during the day (Ambulatory and capable  of all self care but unable to carry out any work activities. Up and about more than 50% of waking hours)  3 - Symptomatic, >50% in bed, but not bedbound (Capable of only limited self-care, confined to bed or chair 50% or more of waking hours)  4 - Bedbound (Completely disabled. Cannot carry on any self-care. Totally confined to bed or chair)  5 - Death   Eustace Pen MM, Creech RH, Tormey DC, et al. [phone]). "Toxicity and response criteria of the [facility]". [place] Oncol. 5 (6): 649-55    LABORATORY DATA:  Lab Results  Component Value Date   WBC 9.5 06/20/2016   HGB 10.7 (L) 06/20/2016   HCT 32.6 (L) 06/20/2016   MCV 83.2 06/20/2016   PLT 297 06/20/2016   Lab Results  Component Value Date   NA 140 06/20/2016   K 3.7 06/20/2016   CL 105 06/20/2016   CO2 26 06/20/2016   Lab Results  Component Value Date   ALT 9 (L) 06/17/2016   AST 15 06/17/2016   ALKPHOS 50 06/17/2016   BILITOT 0.5 06/17/2016      RADIOGRAPHY: Ct Head Wo Contrast  Result Date: 06/18/2016 CLINICAL DATA:  Headache and vomiting. EXAM: CT HEAD WITHOUT CONTRAST TECHNIQUE: Contiguous axial images were obtained from the base of the skull through the vertex without intravenous contrast. COMPARISON:  None. FINDINGS:  Brain: Large area of low-density within the left temporal and occipital lobe consistent with edema, probable central mass lesion. There is left right midline shift of 12 mm, with mild dilatation of the right lateral ventricle. Minimal periventricular white matter changes about the right lateral ventricle. Mild mass effect on the midbrain, the basilar cisterns remain patent. No evidence of hemorrhage. Vascular: No hyperdense vessel or unexpected calcification. Skull: No fracture or focal lesion. Sinuses/Orbits: Paranasal sinuses and mastoid air cells are clear. The visualized orbits are unremarkable. Other: None. IMPRESSION: Findings concerning for intra-axial mass in the central left occipital temporal lobe with large area of surrounding edema. There is 12 mm left-to-right midline shift. Mass effect on the midbrain, however basilar cisterns remain patent. Mild dilatation of the right lateral ventricle with question surrounding periventricular edema. Recommend MRI with contrast for characterization. These results were called by telephone at the time of interpretation on 06/18/2016 at 1:08 am to Dr. Thayer Jew , who verbally acknowledged these results. Electronically Signed   By: Jeb Levering M.D.   On: 06/18/2016 01:08   Mr Brain W And Wo Contrast  Result Date: 06/18/2016 CLINICAL DATA:  Headaches and increasing confusion. Suspected mass on CT EXAM: MRI HEAD WITHOUT AND WITH CONTRAST TECHNIQUE: Multiplanar, multiecho pulse sequences of the brain and surrounding structures were obtained without and with intravenous contrast. CONTRAST:  76m MULTIHANCE GADOBENATE DIMEGLUMINE 529 MG/ML IV SOLN COMPARISON:  Head CT 06/18/2016 FINDINGS: Brain: The midline structures are normal. There is no focal diffusion restriction to indicate acute infarct. There is a peripherally contrast-enhancing mass centered in the left temporal lobe that measures 6.0 x 4.8 x 3.0 cm (AP x Transverse x CC). Surrounding hyperintense T2  weighted signal extends into the right frontal, parietal, temporal and occipital white matter. There is mass effect with rightward midline shift measuring 11 mm at the level of the foramina of [place]. There is early subfalcine herniation of the cingulate gyrus. Areas of susceptibility within the mass may indicate foci of remote hemorrhage. No intraparenchymal hematoma. Mass effect on the left lateral ventricle with mild dilatation of  the right lateral ventricle, but no current evidence of ventricular trapping. Basal cisterns remain evident. The dura is normal and there is no extra-axial collection. Vascular: Major intracranial arterial and venous sinus flow voids are preserved. Skull and upper cervical spine: The visualized skull base, calvarium, upper cervical spine and extracranial soft tissues are normal. Sinuses/Orbits: No fluid levels or advanced mucosal thickening. No mastoid effusion. Normal orbits. IMPRESSION: 1. Centrally necrotic, peripherally contrast-enhancing mass centered in the left temporal lobe is most consistent with a high-grade glioma. Surrounding hyperintense T2 weighted signal is likely a combination of vasogenic edema and nonenhancing tumor. No satellite lesions. 2. 11 mm of rightward midline shift with early subfalcine herniation of the left cingulate gyrus. Midline shift is unchanged from earlier head CT. 3. Mass effect on the left lateral ventricle with mildly dilated appearance of the right lateral ventricle. The patient is at risk for right lateral ventricular trapping, though this has not occurred yet. Electronically Signed   By: Ulyses Jarred M.D.   On: 06/18/2016 03:14       IMPRESSION/PLAN: 1. Putative Glioblastoma Multiforme. I met with the patient to introduce myself and briefly summarize my role. I met with her family and with Wadie Lessen, NP for palliative care. We discussed the findings form her MRI and the concerns regarding obtaining a formal tissue diagnosis. We reviewed  the options for a more palliative approach for radiotherapy and rather than a 6 week traditional course of treatment, Dr. Lisbeth Renshaw has recommended a shortened course of about 2-3 weeks with concurrent temodar. The patient has been seen by Dr. Burr Medico as well, and since our meeting, the patient's CT scan has been performed and read and does not appear to show cause for a primary malignancy. We spent time discussing each family member's perspective on how they thought treatment should be approached, and how we would go about discussing matters of her disease with Jaime [name]. The family requests that no information be discussed about the nature of her cancer, or her treatments without a family member present. After reviewing the poor overall prognosis without, or with treatment, the family elects to move forward with attempting radiotherapy and oral temodar. We will plan to have the patient discharge home when medically appropriate per Dr. Beryle Beams, and see [place] back in the office early next week following the holiday weekend. We will have the patient and family meet Dr. Lisbeth Renshaw, and tour her around the department, see the mask we would use for her set up, and possibly attempt a simulation. I've reached out to our department leads on this as well as we will approach her care delicately to avoid upsetting the patient or overwhelming her. We also spent time discussing the concerns of needing long term steroids due to the size of her tumor and edema. I have recommended that she take an oral PPI daily while on steroids to avoid ulcer or GI compromise. I reviewed the risks, benefits, short, and long term effects of radiotherapy as well as delivery and logistics with the family and we will move forward accordingly. 2. Anxiousness with MRI. The patient has shown evidence of claustrophobia with her MRI this week. We will plan to send her home with ativan to take prior to simulation or treatment. A new prescription is called into  [facility] on [address]. We reviewed the side effect profile of this drug. 3. Goals of care. The patient's family has made it clear that the goals for her treatment should be to help make  her feel better while trying to also treat the cancer. The family has an understanding of overall prognosis being poor despite all therapies offered, but would like to at least attempt treatment, knowing that if Jaime [name] does not tolerate this, we could take a step back and reconvene on goals of her care. We do feel that there is a role for home health to assess her, have nursing be available for medication guidance, and for possible PT evaluation. We will defer this to the primary team to be set up on discharge.   In a visit lasting 90 minutes, greater than 50% of the time was spent face to face discussing her care with her family, and coordinating the patient's care.     Carola Rhine, PAC

## 2016-06-20 NOTE — Progress Notes (Signed)
Transfer Note  Subjective:  Jaime [name] is a 51yo woman with PMHx of type 2 DM, HTN, and autism who presented to the ED on 5/19 for a 2 week hx of headache associated with nausea, vomiting, and photophobia. She had gone to urgent care on 5/10 for a left earache and then again on 5/12 with vomiting and nervous that she may have insects living in her head. In the ED she was noted to be answering some questions inappropriately and had difficulty with speech. CT head revealed edema with LTR midline shift concerning for a mass. MRI confirmed a centrally necrotic, peripherally contrast-enhancing mass centered in the left temporal lobe. She was given Decadron and Neurosurgery was consulted who felt this most likely represented a glioblastoma and gross total resection is not possible given the tumor's extensive involvement. Her case was brought to the tumor board and there was agreement that surgery would carry more risk than benefit. Palliative XRT and oral chemo were recommended. Oncology, Radiation Oncology, and Palliative Care have been consulted as well. Patient was transferred from PCCM to IMTS today.  Today, patient states she is doing well. Denies any headaches, visual changes, nausea, or vomiting. Reports she has been eating/drinking normally. When I asked if she could tell me about her understanding of what was causing her headaches she replied "I don't really understand what's going on. I thought my headaches were from my allergies." We discussed the findings of a brain tumor on her head imaging and that her entire healthcare team is working on a plan on how to give her the best quality of life. I did explain to her that there was not a cure for her brain tumor but that everyone is doing their best to make sure she is comfortable. Her family (mother and aunt at bedside) understands her poor prognosis. We had a very good discussion about optimizing quality of life and family would like for her to be able to  go to the beach and do any other things that she has been wishing to do while she can still enjoy them. Palliative care meeting is this afternoon at 3 PM which mother and aunt plan on attending.   Objective:  Vital signs in last 24 hours: Vitals:   06/19/16 1616 06/19/16 1949 06/19/16 2245 06/20/16 0322  BP:  (!) 146/94 127/70 133/87  Pulse: 88 (!) 110 77 62  Resp: 19 (!) 21 20 11   Temp: 98.8 F (37.1 C) 98.3 F (36.8 C) 99.1 F (37.3 C) 98.3 F (36.8 C)  TempSrc: Oral Oral Oral Oral  SpO2: 100% 100% 99% 100%  Weight:      Height:       General: Well-nourished woman sitting up in bed, NAD, pleasant  HEENT: [place]/AT, EOMI, sclera anicteric, mucus membranes moist CV: RRR, no m/g/r Pulm: CTA bilaterally, breaths non-labored Abd: BS+, soft, non-tender Ext: warm, no peripheral edema Neuro: alert, fluent speech, moves all extremities    Assessment/Plan:  Left Temporal Lobe Brain Tumor: Likely glioblastoma. Plan based on Neurosurgery and Oncology recommendations seems to be oral chemo with Temodar and palliative XRT. Radiation Oncology has been consulted today. Plan for Palliative Care meeting at 3 PM which I will try to make. Despite the midline shift shown on imaging, patient seems to be doing very well clinically. I am concerned the patient does not fully understand her diagnosis and prognosis. I discussed this more with her family and they see this as more of a "blessing" and feel she  will be able to enjoy her time left since she does not understand. Hopefully will be able to get patient home by tomorrow with outpatient follow up with Oncology and Rad/Onc.  - Appreciate Neurosurgery, Oncology, Rad/Onc, and Palliative Care's involvement with this case - f/u CT chest/abd/pelvis results - Decrease Decadron to 6 mg Q6H  HTN: BP stable in 681P-947M systolic.  - Continue Lisinopril 20 mg daily   Type 2 DM: Blood sugars have been elevated in 170-270 range likely due to steroids.  - Increase  Lantus to 9 units QHS - Continue moderate ISS - CBG checks 4 times daily   Diet: Card modified  DVT Ppx: Heparin SQ Dispo: Discharge likely tomorrow   Jaime Felling, MD, MPH Internal Medicine Resident, PGY-3 06/20/2016, 7:46 AM Pager: [phone]

## 2016-06-20 NOTE — Care Management Note (Signed)
Case Management Note  Patient Details  Name: Jaime [name] MRN: [id number] Date of Birth: 12-Aug-1965  Subjective/Objective:   From home with mother , patient has autism.  Presents with  Unresectable likely glioblastoma multiforme , htn  And Type 2 DM. Plan per MD note  is to make referral to radiation oncology and medical oncology for radiation with  Oral chemo. Palliative consulted also.  Patient has [employer].   PCP listed   [facility] [place]            Action/Plan: NCM will follow for dc needs.    Expected Discharge Date:                  Expected Discharge Plan:     In-House Referral:     Discharge planning Services  CM Consult  Post Acute Care Choice:    Choice offered to:     DME Arranged:    DME Agency:     HH Arranged:    HH Agency:     Status of Service:  In process, will continue to follow  If discussed at Long Length of Stay Meetings, dates discussed:    Additional Comments:  Zenon Mayo, RN 06/20/2016, 2:35 PM

## 2016-06-20 NOTE — Progress Notes (Signed)
Patient ID: [name], female   DOB: [birth year], 51 y.o.   MRN: [id number]   This NP meet at  patient's bedside as scheduled  for  follow up to  yesterday's GOCs meeting with patient's mother, brother, niece  for continued discussion regarding diagnosis, prognosis, treatment options decisions and anticipatory care needs in this very difficult situation.  Worthy Flank PA-C with radiation-oncology present for meeting.  Detailed discussion regarding the overall poor prognosis regardless of decision to move forward with radiation/chemotherapy treatments vs comfort/hospice appraoch.   Risks and benefits of treatment detailed.  Family was able to speak to their belief that Roby would want "to try something" .  As a family they have made decision to move forward with treatment.    Mother was able to express her understanding and sadness in knowing "I am going to lose a child"  Emotional support offered.  Plan is to discharge home, likely in the morning, enjoy home  for a few days, and then simulation will be set up for early next week with  radiation treatments to follow.   Family has asked to have oncology f/u with Dr Benay Spice.   Every effort will be made to help Jaleeah understand and feel comfortable as she moves forward with treatment.  Discussion had in regards to the fact that anything could happen at anytime, and the likelihood that patient will decline physically and functionally and require more hands on care.    Concept of hospice benefit introduced.  Discussed with family the importance of continued conversation with family and their  medical providers regarding overall plan of care and treatment options,  ensuring decisions are within the context of the patients values and GOCs.  Questions and concerns addressed, family encouraged to call with any need.  Time in  1500         Time out    1600  Total time spent on the unit was 60 min  Discussed with Dr Arcelia Jew  Greater than  50% of the time was spent in counseling and coordination of care  Wadie Lessen NP  Palliative Medicine Team Team Phone # [phone] Pager [phone]

## 2016-06-21 ENCOUNTER — Other Ambulatory Visit: Payer: Self-pay | Admitting: Radiation Oncology

## 2016-06-21 ENCOUNTER — Other Ambulatory Visit: Payer: Self-pay | Admitting: Hematology

## 2016-06-21 ENCOUNTER — Telehealth: Payer: Self-pay

## 2016-06-21 DIAGNOSIS — K219 Gastro-esophageal reflux disease without esophagitis: Secondary | ICD-10-CM

## 2016-06-21 DIAGNOSIS — E785 Hyperlipidemia, unspecified: Secondary | ICD-10-CM

## 2016-06-21 DIAGNOSIS — G44221 Chronic tension-type headache, intractable: Secondary | ICD-10-CM

## 2016-06-21 DIAGNOSIS — Z803 Family history of malignant neoplasm of breast: Secondary | ICD-10-CM

## 2016-06-21 DIAGNOSIS — Z794 Long term (current) use of insulin: Secondary | ICD-10-CM

## 2016-06-21 DIAGNOSIS — E11319 Type 2 diabetes mellitus with unspecified diabetic retinopathy without macular edema: Secondary | ICD-10-CM

## 2016-06-21 DIAGNOSIS — E1165 Type 2 diabetes mellitus with hyperglycemia: Secondary | ICD-10-CM

## 2016-06-21 DIAGNOSIS — C719 Malignant neoplasm of brain, unspecified: Secondary | ICD-10-CM

## 2016-06-21 LAB — GLUCOSE, CAPILLARY
GLUCOSE-CAPILLARY: 159 mg/dL — AB (ref 65–99)
GLUCOSE-CAPILLARY: 263 mg/dL — AB (ref 65–99)
Glucose-Capillary: 219 mg/dL — ABNORMAL HIGH (ref 65–99)
Glucose-Capillary: 282 mg/dL — ABNORMAL HIGH (ref 65–99)
Glucose-Capillary: 262 mg/dL — ABNORMAL HIGH (ref 65–99)
Glucose-Capillary: 204 mg/dL — ABNORMAL HIGH (ref 65–99)

## 2016-06-21 MED ORDER — LORAZEPAM 0.5 MG PO TABS: ORAL_TABLET | ORAL | 0 refills | Status: DC

## 2016-06-21 MED ORDER — INSULIN ASPART 100 UNIT/ML ~~LOC~~ SOLN
5.0000 [IU] | Freq: Three times a day (TID) | SUBCUTANEOUS | Status: DC
  Administered 2016-06-21 – 2016-06-22 (×4): 5 [IU] via SUBCUTANEOUS

## 2016-06-21 MED ORDER — ONDANSETRON 4 MG PO TBDP: 4.0000 mg | ORAL_TABLET | Freq: Three times a day (TID) | ORAL | 0 refills | Status: DC | PRN

## 2016-06-21 MED ORDER — LEVETIRACETAM 500 MG PO TABS: 500.0000 mg | ORAL_TABLET | Freq: Two times a day (BID) | ORAL | 0 refills | Status: DC

## 2016-06-21 MED ORDER — TEMOZOLOMIDE 140 MG PO CAPS: 140.0000 mg | ORAL_CAPSULE | Freq: Every day | ORAL | 0 refills | Status: DC

## 2016-06-21 MED ORDER — INSULIN GLARGINE 100 UNIT/ML ~~LOC~~ SOLN
12.0000 [IU] | Freq: Every day | SUBCUTANEOUS | Status: DC
  Administered 2016-06-21: 12 [IU] via SUBCUTANEOUS
  Filled 2016-06-21: qty 0.12

## 2016-06-21 MED ORDER — METFORMIN HCL 1000 MG PO TABS: 1000.0000 mg | ORAL_TABLET | Freq: Two times a day (BID) | ORAL | 2 refills | Status: DC

## 2016-06-21 MED ORDER — INSULIN PEN NEEDLE 31G X 5 MM MISC: 0 refills | Status: DC

## 2016-06-21 MED ORDER — FAMOTIDINE 20 MG PO TABS: 20.0000 mg | ORAL_TABLET | Freq: Two times a day (BID) | ORAL | 0 refills | Status: DC

## 2016-06-21 MED ORDER — BLOOD GLUCOSE METER KIT
PACK | 0 refills | Status: DC
Start: 2016-06-21 — End: 2017-06-28

## 2016-06-21 MED ORDER — DEXAMETHASONE 6 MG PO TABS: 6.0000 mg | ORAL_TABLET | Freq: Four times a day (QID) | ORAL | 0 refills | Status: DC

## 2016-06-21 MED ORDER — INSULIN ASPART 100 UNIT/ML FLEXPEN: 5.0000 [IU] | PEN_INJECTOR | Freq: Three times a day (TID) | SUBCUTANEOUS | 0 refills | Status: DC

## 2016-06-21 MED ORDER — INSULIN GLARGINE 100 UNITS/ML SOLOSTAR PEN: 12.0000 [IU] | PEN_INJECTOR | Freq: Every day | SUBCUTANEOUS | 0 refills | Status: DC

## 2016-06-21 NOTE — Care Management Note (Addendum)
Case Management Note  Patient Details  Name: SHAHIRA FISKE MRN: [id number] Date of Birth: [birth year]  Subjective/Objective:                    Action/Plan: Pt discharging home with orders for [facility] services. CM met with the patient and her family and provided a [facility] list. They selected [place]. Santiago Glad with [facility] notified and accepted the referral. Pt will not receive HH PT/OT d/t her diagnosis. Medicaid will not cover these therapies. Patient and MD are aware.  Family to provide transportation home.  Expected Discharge Date:  06/21/16               Expected Discharge Plan:  [place]  In-House Referral:     Discharge planning Services  CM Consult  Post Acute Care Choice:  Home Health Choice offered to:  Patient, Parent  DME Arranged:    DME Agency:     HH Arranged:  RN, PT, OT, Nurse's Aide (Pt will not receive PT/OT with her diagnosis and Medicaid) [place] Agency:  [place]  Status of Service:  Completed, signed off  If discussed at [place] of Stay Meetings, dates discussed:    Additional Comments:  Pollie Friar, RN 06/21/2016, 4:43 PM

## 2016-06-21 NOTE — Discharge Summary (Signed)
Name: Jaime [name] MRN: [id number] DOB: 03-13-1965 51 y.o. PCP: Minus Liberty, MD  Date of Admission: 06/17/2016 11:56 PM Date of Discharge: 06/21/2016 Attending Physician: Annia Belt, MD  Discharge Diagnosis: Principal Problem:   Brain tumor [facility]) Active Problems:   Diabetes mellitus type 2 with retinopathy ([place])   Hypertension   Non-intractable vomiting with nausea   HLD (hyperlipidemia)   GERD (gastroesophageal reflux disease)   Intractable headache   Palliative care by specialist   Discharge Medications: Allergies as of 06/21/2016   No Known Allergies     Medication List    STOP taking these medications   predniSONE 50 MG tablet Commonly known as:  DELTASONE   ranitidine 300 MG tablet Commonly known as:  ZANTAC     TAKE these medications   blood glucose meter kit and supplies Dispense based on patient and insurance preference. Use up to four times daily as directed. (FOR ICD-9 250.00, 250.01).   dexamethasone 6 MG tablet Commonly known as:  DECADRON Take 1 tablet (6 mg total) by mouth 4 (four) times daily.   famotidine 20 MG tablet Commonly known as:  PEPCID Take 1 tablet (20 mg total) by mouth 2 (two) times daily.   fluticasone 50 MCG/ACT nasal spray Commonly known as:  FLONASE Place 2 sprays into both nostrils daily.   insulin aspart 100 UNIT/ML FlexPen Commonly known as:  NOVOLOG FLEXPEN Inject 5 Units into the skin 3 (three) times daily with meals.   insulin glargine 100 unit/mL Sopn Commonly known as:  LANTUS Inject 0.12 mLs (12 Units total) into the skin at bedtime.   Insulin Pen Needle 31G X 5 MM Misc Use for injections under the skin as directed.   levETIRAcetam 500 MG tablet Commonly known as:  KEPPRA Take 1 tablet (500 mg total) by mouth 2 (two) times daily.   lisinopril 40 MG tablet Commonly known as:  PRINIVIL,ZESTRIL Take 1 tablet (40 mg total) by mouth daily.   loratadine 10 MG tablet Commonly known as:   CLARITIN Take 1 tablet (10 mg total) by mouth daily.   LORazepam 0.5 MG tablet Commonly known as:  ATIVAN 1 tablet po 30 minutes prior to radiation or MRI   metFORMIN 1000 MG tablet Commonly known as:  GLUCOPHAGE Take 1 tablet (1,000 mg total) by mouth 2 (two) times daily with a meal.   montelukast 10 MG tablet Commonly known as:  SINGULAIR Take 1 tablet (10 mg total) by mouth at bedtime.   ondansetron 4 MG disintegrating tablet Commonly known as:  ZOFRAN ODT Take 1 tablet (4 mg total) by mouth every 8 (eight) hours as needed for nausea or vomiting.   pravastatin 20 MG tablet Commonly known as:  PRAVACHOL TAKE 1 TABLET BY MOUTH EVERY EVENING   temozolomide 140 MG capsule Commonly known as:  TEMODAR Take 1 capsule (140 mg total) by mouth daily. May take on an empty stomach or at bedtime to decrease nausea & vomiting.       Disposition and follow-up:   [name] was discharged from [facility] in Stable condition.  At the hospital follow up visit please address:  1.  Brain Mass: assess rad onc and onc follow up. Hyperglycemia: assess compliance and issues with insulin and BG monitoring. Titrate insulin as needed.  Follow-up Appointments: Follow-up Information    Ladell Pier, MD Follow up.   Specialty:  Oncology Why:  The cancer center will call you to set up an  appointment. Contact information: [place] [address] [phone]        Kyung Rudd, MD Follow up.   Specialty:  Radiation Oncology Why:  Please follow up next Tuesday for continued treatment. Contact information: [place] [place] [address] [phone]        Minus Liberty, MD. Go on 07/03/2016.   Specialty:  Internal Medicine Why:  Please keep your standing appointment. Contact information: [place] [address] [phone]           Hospital Course by problem list: Principal Problem:   Brain tumor  [facility]) Active Problems:   Diabetes mellitus type 2 with retinopathy ([place])   Hypertension   Non-intractable vomiting with nausea   HLD (hyperlipidemia)   GERD (gastroesophageal reflux disease)   Intractable headache   Palliative care by specialist   [name] is a 51yo woman with PMHx of type 2 DM, HTN, and autism who presented to the ED on 5/19 for a 2 week hx of headache associated with nausea, vomiting, and photophobia. She had gone to urgent care on 5/10 for a left earache and then again on 5/12 with vomiting and nervousness that she may have insects living in her head. In the ED she was noted to be answering some questions inappropriately and had difficulty with speech. CT head revealed edema with LTR midline shift concerning for a mass. MRI confirmed a centrally necrotic, peripherally contrast-enhancing mass centered in the left temporal lobe. She was given Decadron and Neurosurgery was consulted who felt this most likely represented a glioblastoma and gross total resection is not possible given the tumor's extensive involvement. Her case was brought to the tumor board and there was agreement that surgery would carry more risk than benefit. Palliative XRT and oral chemo were recommended. Oncology, Radiation Oncology, and Palliative Care have been consulted as well. She was initially admitted to the ICU and then transferred to the floor after stabilization for discharge to outpatient radiation therapy and chemotherapy.  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 Patient was not thought to be a good surgical candidate and was treated with Decadron taper to 6 mg 4 times a day. She improved symptomatically with resolution of headache and denied any nausea or vomiting. Patient had staging CT chest abdomen pelvis which did not reveal any other non-CNS primary.  Oncology, radiation oncology, and palliative care were consulted and didn't agreement with the family decided on palliative XRT with oral team at our therapy which could be pursued as an outpatient given the patient's current stability. Patient was discharged with Decadron and follow-up in the radiation oncology clinic next week. Patient was also started on 500 mg Keppra twice a day for seizure prophylaxis, Zofran as needed for nausea, famotidine twice a day for GI prophylaxis given high-dose steroids. Patient was also discharged with home health nursing and PT for additional support.  Steroid-induced hyperglycemia: The setting of high-dose Decadron patient had elevated blood glucose. She was started on long-acting Lantus which was titrated to 12 units at the time of discharge. She was also started on mealtime insulin of NovoLog 5 units. Patient's mother is a diabetic and is familiar with insulin administration and blood glucose monitoring. Given the patient's autism in poor health literacy diabetic coordinator was consulted for education. Patient was prescribed blood glucose monitoring kit, Lantus and NovoLog pens  and needles, and was counseled on anticipatory guidance to contact her PCP for management of her insulins should her blood glucose be elevated or she have signs or symptoms of hypoglycemia.  Discharge Vitals:   BP 127/84 (BP Location: Right Arm)   Pulse 88   Temp 98.5 F (36.9 C) (Oral)   Resp 18   Ht 5' 2.5" (1.588 m)   Wt 162 lb 9.6 oz (73.8 kg)   LMP 10/31/2005   SpO2 99%   BMI 29.27 kg/m   Pertinent Labs, Studies, and Procedures: See below.  Procedures Performed:  Ct Head Wo Contrast Result Date: 06/18/2016 IMPRESSION: Findings concerning for intra-axial mass in the central left occipital temporal lobe with large area of surrounding edema. There is 12 mm left-to-right midline shift. Mass effect on the midbrain, however basilar cisterns remain patent. Mild dilatation of the right  lateral ventricle with question surrounding periventricular edema. Recommend MRI with contrast for characterization.  Mr Jeri Cos And Wo Contrast Result Date: 06/18/2016 IMPRESSION: 1. Centrally necrotic, peripherally contrast-enhancing mass centered in the left temporal lobe is most consistent with a high-grade glioma. Surrounding hyperintense T2 weighted signal is likely a combination of vasogenic edema and nonenhancing tumor. No satellite lesions. 2. 11 mm of rightward midline shift with early subfalcine herniation of the left cingulate gyrus. Midline shift is unchanged from earlier head CT. 3. Mass effect on the left lateral ventricle with mildly dilated appearance of the right lateral ventricle. The patient is at risk for right lateral ventricular trapping, though this has not occurred yet.  Ct Chest W Contrast Ct Abdomen Pelvis W Contrast Result Date: 06/20/2016 IMPRESSION: 1. No evidence of a primary malignancy or metastatic disease in the chest, abdomen or pelvis. 2. No acute abnormality.   Consultations: Neurosurgery Radiation Oncology Oncology Palliative Care  Discharge Instructions: Discharge Instructions    Call MD for:  difficulty breathing, headache or visual disturbances    Complete by:  As directed    Call MD for:  extreme fatigue    Complete by:  As directed    Call MD for:  persistant dizziness or light-headedness    Complete by:  As directed    Call MD for:  persistant nausea and vomiting    Complete by:  As directed    Call MD for:  severe uncontrolled pain    Complete by:  As directed    Diet - low sodium heart healthy    Complete by:  As directed    Discharge instructions    Complete by:  As directed    You have a spot in your brain which needs treatment. We have started a medication to help called Decadron which will help to keep you from getting headaches. This medication can also cause your blood sugar to go up and we will need to give you insulin shots every  day to help lower your blood sugar. You should check your blood sugar every morning and then before every meal and bedtime. Call you doctor if you notice that your blood sugars are high, or if you experience any symptoms of high blood sugar like increased thirst or urination. You could also have low blood sugars with the insulin and it will be important to eat something sugary immediately if you feel shaky, dizzy, or notice that your blood sugar is < 60 when you check it.  You will have an appointment with several doctors next week to help start treatments for the spot in your brain.  We will also arrange home health nurses and physical therapists to help you with medications and getting used to these changes at home.  Please let your doctor know if you have any questions.   Increase activity slowly    Complete by:  As directed      Signed: Holley Raring, MD 06/21/2016, 3:56 PM   Pager: [phone]

## 2016-06-21 NOTE — Telephone Encounter (Signed)
HOSPITAL [place], DISCHARGE 06/21/2016, APPT 07/03/2016

## 2016-06-21 NOTE — Plan of Care (Signed)
Problem: Safety: Goal: Ability to remain free from injury will improve Outcome: Progressing No incidence of falls during this admission. Call bell within reach. Bed in low and locked position. Family at bedside. Clean and clear environment maintained. Bed alarm on. Nonskid footwear being utilized. 2/4 siderails in place. Patient verbalized understanding of safety instruction.  Problem: Role Relationship: Goal: Familys ability to cope with current situation will improve Outcome: Progressing Family is taking news as best as they can with a main focus of keeping the patient comfortable and improving her quality of life.

## 2016-06-21 NOTE — Progress Notes (Signed)
Inpatient Diabetes Program Recommendations  AACE/ADA: New Consensus Statement on Inpatient Glycemic Control (2015)  Target Ranges:  Prepandial:   less than 140 mg/dL      Peak postprandial:   less than 180 mg/dL (1-2 hours)      Critically ill patients:  140 - 180 mg/dL   Lab Results  Component Value Date   GLUCAP 262 (H) 06/21/2016   HGBA1C 6.7 03/27/2016    Review of Glycemic Control  Inpatient Diabetes Program Recommendations:  Received consult to review insulin administration teaching with patient and mom. Met with Patient and mom @ bedside and reviewed insulin pen administration. Mom verbalized understanding of insulin pen administration including attaching pen needle, needle flush of 2 units, site rotation, and administration. Patient's mom is also on insulin basal and meal coverage @ home. Reviewed hypoglycemia protocol with patient and mom with return verbalized response. Dr. Holley Raring to order @ D/C: Meter kit [id number] Insulin pen needles [phone]  Thank you, Nani Gasser. Dalma Panchal, RN, MSN, CDE  Diabetes Coordinator Inpatient Glycemic Control Team Team Pager [phone] (8am-5pm) 06/21/2016 4:05 PM

## 2016-06-21 NOTE — Progress Notes (Signed)
  Subjective:  Jaime [name] is doing well today. No complaints. No HA, N/V, or FND.  Objective:  Vital signs in last 24 hours: Vitals:   06/20/16 2111 06/21/16 0047 06/21/16 0516 06/21/16 1033  BP: (!) 158/81 121/62 113/64 120/68  Pulse: 70 88 60 66  Resp: '18 18 18 18  '$ Temp: 98 F (36.7 C) 97.6 F (36.4 C) 98.5 F (36.9 C) 98.6 F (37 C)  TempSrc: Oral Oral Oral Oral  SpO2:  100% 99% 99%  Weight: 162 lb 9.6 oz (73.8 kg)     Height: 5' 2.5" (1.588 m)      General: Well-nourished woman sitting up in bed, NAD, pleasant  HEENT: [place]/AT, EOMI, sclera anicteric, mucus membranes moist CV: RRR, no m/g/r Pulm: CTA bilaterally, breaths non-labored Abd: BS+, soft, non-tender Ext: warm, no peripheral edema Neuro: alert, fluent speech, moves all extremities    Assessment/Plan:  Left Temporal Lobe Brain Tumor: CT chest, abd, pelvis w/o evidence of extra-CNS primary. Met with Rad Onc and Palliative Care yesterday. Plan for outpt follow up for XRT and Temodar. Will establish onc care with Dr. Benay Spice. Will benefit from [facility] for med assistance and PT eval. - continue Decadron to 6 mg Q6H  HTN: Continue Lisinopril 20 mg daily   Type 2 DM: Blood sugars have been elevated >200 due to steroids. Pt will need insulin outpt and needs education on CBG monitoring and insulin administration - Increase Lantus to 12 units QHS - Add Novolog mealtime 5U TID qAC - CBG checks 4 times daily - DM coordinator for education  Diet: Card modified  DVT Ppx: Heparin SQ Dispo: Discharge likely today.   Jaime Felling, MD, MPH Internal Medicine Resident, PGY-3 06/21/2016, 3:04 PM Pager: [phone]

## 2016-06-21 NOTE — Progress Notes (Signed)
Jaime [name]   DOB:Feb 25, 1965   MO#:[id number]   KPT#:[id number]  Oncology follow up   Interval History:  Chart reviewed. Pt and her mother has requested to see Dr. Benay Spice as outpt. I spoke with her mother today about the plan with dr. Lisbeth Renshaw and medical oncology. We are working on getting Temodal chemo pill ready for her.   Objective:  Vitals:   06/21/16 1033 06/21/16 1417  BP: 120/68 127/84  Pulse: 66 88  Resp: 18 18  Temp: 98.6 F (37 C) 98.5 F (36.9 C)    Body mass index is 29.27 kg/m. No intake or output data in the 24 hours ending 06/21/16 1528  Pt not examed today   CBG (last 3)   Recent Labs  06/21/16 0336 06/21/16 0936 06/21/16 1236  GLUCAP 159* 282* 262*     Labs:  Lab Results  Component Value Date   WBC 9.5 06/20/2016   HGB 10.7 (L) 06/20/2016   HCT 32.6 (L) 06/20/2016   MCV 83.2 06/20/2016   PLT 297 06/20/2016   NEUTROABS 4.4 01/27/2014    Urine Studies No results for input(s): UHGB, CRYS in the last 72 hours.  Invalid input(s): UACOL, UAPR, USPG, UPH, UTP, UGL, UKET, UBIL, UNIT, UROB, ULEU, UEPI, UWBC, URBC, UBAC, CAST, [place], [place]  Basic Metabolic Panel:  Recent Labs Lab 06/17/16 2202 06/18/16 0453 06/18/16 0745 06/18/16 1955 06/20/16 0438  NA 138  --  136 139 140  K 2.8*  --  3.6 3.8 3.7  CL 95*  --  102 103 105  CO2 32  --  25 27 26   GLUCOSE 159*  --  212* 219* 170*  BUN <5*  --  <5* 8 11  CREATININE 0.50  --  0.45 0.54 0.46  CALCIUM 9.4  --  8.8* 9.1 9.4  MG  --  1.4*  --   --   --    GFR Estimated Creatinine Clearance: 81 mL/min (by C-G formula based on SCr of 0.46 mg/dL). Liver Function Tests:  Recent Labs Lab 06/17/16 2202  AST 15  ALT 9*  ALKPHOS 50  BILITOT 0.5  PROT 7.3  ALBUMIN 3.9   No results for input(s): LIPASE, AMYLASE in the last 168 hours. No results for input(s): AMMONIA in the last 168 hours. Coagulation profile  Recent Labs Lab 06/18/16 0453  INR 1.12    CBC:  Recent Labs Lab  06/17/16 2202 06/19/16 1215 06/20/16 0438  WBC 6.7 9.7 9.5  HGB 10.8* 11.4* 10.7*  HCT 33.0* 34.6* 32.6*  MCV 83.8 83.6 83.2  PLT 306 330 297   Cardiac Enzymes: No results for input(s): CKTOTAL, CKMB, CKMBINDEX, TROPONINI in the last 168 hours. BNP: Invalid input(s): POCBNP CBG:  Recent Labs Lab 06/20/16 1936 06/21/16 0046 06/21/16 0336 06/21/16 0936 06/21/16 1236  GLUCAP 220* 219* 159* 282* 262*   D-Dimer No results for input(s): DDIMER in the last 72 hours. Hgb A1c No results for input(s): HGBA1C in the last 72 hours. Lipid Profile No results for input(s): CHOL, HDL, LDLCALC, TRIG, CHOLHDL, LDLDIRECT in the last 72 hours. Thyroid function studies No results for input(s): TSH, T4TOTAL, T3FREE, THYROIDAB in the last 72 hours.  Invalid input(s): FREET3 Anemia work up No results for input(s): VITAMINB12, FOLATE, FERRITIN, TIBC, IRON, RETICCTPCT in the last 72 hours. Microbiology Recent Results (from the past 240 hour(s))  Urine culture     Status: Abnormal   Collection Time: 06/17/16  4:36 AM  Result Value Ref Range Status  Specimen Description URINE, RANDOM  Final   Special Requests NONE  Final   Culture MULTIPLE SPECIES PRESENT, SUGGEST RECOLLECTION (A)  Final   Report Status 06/19/2016 FINAL  Final  MRSA PCR Screening     Status: None   Collection Time: 06/18/16  7:01 AM  Result Value Ref Range Status   MRSA by PCR NEGATIVE NEGATIVE Final    Comment:        The GeneXpert MRSA Assay (FDA approved for NASAL specimens only), is one component of a comprehensive MRSA colonization surveillance program. It is not intended to diagnose MRSA infection nor to guide or monitor treatment for MRSA infections.       Studies:  Ct Chest W Contrast  Result Date: 06/20/2016 CLINICAL DATA:  New diagnosis of left temporal lobe intra-axial cerebral mass, suspected to represent a high-grade glioma on MRI. EXAM: CT CHEST, ABDOMEN, AND PELVIS WITH CONTRAST TECHNIQUE:  Multidetector CT imaging of the chest, abdomen and pelvis was performed following the standard protocol during bolus administration of intravenous contrast. CONTRAST:  17mL ISOVUE-300 IOPAMIDOL (ISOVUE-300) INJECTION 61% COMPARISON:  None. FINDINGS: CT CHEST FINDINGS Cardiovascular: Normal heart size. No significant pericardial fluid/thickening. Great vessels are normal in course and caliber. No central pulmonary emboli. Mediastinum/Nodes: No discrete thyroid nodules. Unremarkable esophagus. No pathologically enlarged axillary, mediastinal or hilar lymph nodes. Lungs/Pleura: No pneumothorax. No pleural effusion. No acute consolidative airspace disease, lung masses or significant pulmonary nodules. Minimal scarring versus atelectasis in the right middle lobe and lingula. Musculoskeletal: No aggressive appearing focal osseous lesions. Mild thoracic spondylosis. CT ABDOMEN PELVIS FINDINGS Hepatobiliary: Normal liver with no liver mass. Normal gallbladder with no radiopaque cholelithiasis. No biliary ductal dilatation. Pancreas: Normal, with no mass or duct dilation. Spleen: Normal size. No mass. Adrenals/Urinary Tract: Normal adrenals. Small simple parapelvic renal cysts in both kidneys. No hydronephrosis. No suspicious renal masses. Normal bladder. Stomach/Bowel: Grossly normal stomach. Normal caliber small bowel with no small bowel wall thickening. Normal appendix. Normal large bowel with no diverticulosis, large bowel wall thickening or pericolonic fat stranding. Vascular/Lymphatic: Normal caliber abdominal aorta. Patent portal, splenic, hepatic and renal veins. No pathologically enlarged lymph nodes in the abdomen or pelvis. Reproductive: Status post hysterectomy, with no abnormal findings at the vaginal cuff. No adnexal mass. Other: No pneumoperitoneum, ascites or focal fluid collection. Musculoskeletal: No aggressive appearing focal osseous lesions. Moderate facet arthropathy in the lower lumbar spine  bilaterally. IMPRESSION: 1. No evidence of a primary malignancy or metastatic disease in the chest, abdomen or pelvis. 2. No acute abnormality. Electronically Signed   By: Ilona Sorrel M.D.   On: 06/20/2016 18:19   Ct Abdomen Pelvis W Contrast  Result Date: 06/20/2016 CLINICAL DATA:  New diagnosis of left temporal lobe intra-axial cerebral mass, suspected to represent a high-grade glioma on MRI. EXAM: CT CHEST, ABDOMEN, AND PELVIS WITH CONTRAST TECHNIQUE: Multidetector CT imaging of the chest, abdomen and pelvis was performed following the standard protocol during bolus administration of intravenous contrast. CONTRAST:  141mL ISOVUE-300 IOPAMIDOL (ISOVUE-300) INJECTION 61% COMPARISON:  None. FINDINGS: CT CHEST FINDINGS Cardiovascular: Normal heart size. No significant pericardial fluid/thickening. Great vessels are normal in course and caliber. No central pulmonary emboli. Mediastinum/Nodes: No discrete thyroid nodules. Unremarkable esophagus. No pathologically enlarged axillary, mediastinal or hilar lymph nodes. Lungs/Pleura: No pneumothorax. No pleural effusion. No acute consolidative airspace disease, lung masses or significant pulmonary nodules. Minimal scarring versus atelectasis in the right middle lobe and lingula. Musculoskeletal: No aggressive appearing focal osseous lesions. Mild thoracic spondylosis. CT  ABDOMEN PELVIS FINDINGS Hepatobiliary: Normal liver with no liver mass. Normal gallbladder with no radiopaque cholelithiasis. No biliary ductal dilatation. Pancreas: Normal, with no mass or duct dilation. Spleen: Normal size. No mass. Adrenals/Urinary Tract: Normal adrenals. Small simple parapelvic renal cysts in both kidneys. No hydronephrosis. No suspicious renal masses. Normal bladder. Stomach/Bowel: Grossly normal stomach. Normal caliber small bowel with no small bowel wall thickening. Normal appendix. Normal large bowel with no diverticulosis, large bowel wall thickening or pericolonic fat  stranding. Vascular/Lymphatic: Normal caliber abdominal aorta. Patent portal, splenic, hepatic and renal veins. No pathologically enlarged lymph nodes in the abdomen or pelvis. Reproductive: Status post hysterectomy, with no abnormal findings at the vaginal cuff. No adnexal mass. Other: No pneumoperitoneum, ascites or focal fluid collection. Musculoskeletal: No aggressive appearing focal osseous lesions. Moderate facet arthropathy in the lower lumbar spine bilaterally. IMPRESSION: 1. No evidence of a primary malignancy or metastatic disease in the chest, abdomen or pelvis. 2. No acute abnormality. Electronically Signed   By: Ilona Sorrel M.D.   On: 06/20/2016 18:19    Assessment: ASSESSMENT & PLAN:  52 y.o. female with PMH of DM, HTN, autism, who presents with headaches for a few weeks. MRI brain showed a 6.0 x 4.8 x 3.0 cm in the left temporal lobe with central necrosis and the peripheral enhancement. There is vasogenic edema, and 11 mm midline shift with subfalcine herniation of the left cingulate gyrus.  1. Left temporal lobe brain tumor with vasogenic edema and midline shift, most consistent with glioblastoma. 2. DM 3. HTN 4. autism   Plan:  -I did not see pt today, but spoke with her mother   -she will start palliative chemoRT next week in our cancer center  -I will arrange her to f/u with my partner Dr. Melvia Heaps next week -I have spoken with our oral pharmacist and ordered Temodar, she will get her Temodar prescription filled at specialty pharmacy, and we will offer free sample if her medicine has not arrived when she starts RT next week.  -I am remain to be available for questions.    Truitt Merle, MD 06/21/2016  3:28 PM

## 2016-06-22 ENCOUNTER — Encounter (HOSPITAL_COMMUNITY): Payer: Self-pay | Admitting: Oncology

## 2016-06-22 DIAGNOSIS — B370 Candidal stomatitis: Secondary | ICD-10-CM

## 2016-06-22 DIAGNOSIS — T380X5A Adverse effect of glucocorticoids and synthetic analogues, initial encounter: Secondary | ICD-10-CM

## 2016-06-22 DIAGNOSIS — E099 Drug or chemical induced diabetes mellitus without complications: Secondary | ICD-10-CM

## 2016-06-22 HISTORY — DX: Drug or chemical induced diabetes mellitus without complications: E09.9

## 2016-06-22 HISTORY — DX: Drug or chemical induced diabetes mellitus without complications: T38.0X5A

## 2016-06-22 HISTORY — DX: Candidal stomatitis: B37.0

## 2016-06-22 LAB — GLUCOSE, CAPILLARY
GLUCOSE-CAPILLARY: 182 mg/dL — AB (ref 65–99)
Glucose-Capillary: 148 mg/dL — ABNORMAL HIGH (ref 65–99)
Glucose-Capillary: 211 mg/dL — ABNORMAL HIGH (ref 65–99)
Glucose-Capillary: 212 mg/dL — ABNORMAL HIGH (ref 65–99)
Glucose-Capillary: 242 mg/dL — ABNORMAL HIGH (ref 65–99)

## 2016-06-22 MED ORDER — NYSTATIN 100000 UNIT/ML MT SUSP: 5.0000 mL | Freq: Four times a day (QID) | OROMUCOSAL | 0 refills | Status: DC

## 2016-06-22 MED ORDER — INSULIN GLARGINE 100 UNIT/ML ~~LOC~~ SOLN
15.0000 [IU] | Freq: Every day | SUBCUTANEOUS | Status: DC
  Filled 2016-06-22: qty 0.15

## 2016-06-22 MED ORDER — INSULIN GLARGINE 100 UNITS/ML SOLOSTAR PEN: 15.0000 [IU] | PEN_INJECTOR | Freq: Every day | SUBCUTANEOUS | 0 refills | Status: DC

## 2016-06-22 NOTE — Progress Notes (Signed)
  Subjective:  Jaime [name] is doing well today. NAD, no complaints.  Objective:  Vital signs in last 24 hours: Vitals:   06/21/16 2123 06/22/16 0100 06/22/16 0449 06/22/16 0500  BP: 130/79 115/69 120/73   Pulse: 71 69 62   Resp: 18 18 18    Temp: 98.4 F (36.9 C) 98.1 F (36.7 C) 97.9 F (36.6 C)   TempSrc: Oral Oral Oral   SpO2: 98% 100% 98%   Weight:    166 lb 3.2 oz (75.4 kg)  Height:       General: Well-nourished woman sitting up in bed, NAD, pleasant  HEENT: [place]/AT, EOMI, sclera anicteric, mucus membranes moist, minimal signs of yeast in post oropharynx CV: RRR, no m/g/r Pulm: CTA bilaterally, breaths non-labored Abd: BS+, soft, non-tender Ext: warm, no peripheral edema Neuro: alert, fluent speech, moves all extremities    Assessment/Plan:  Left Temporal Lobe Brain Tumor, 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 Plan for outpt follow up for XRT and Temodar. - continue Decadron to 6 mg Q6H  HTN: Continue Lisinopril 20 mg daily   Type 2 DM: Blood sugars have been elevated >200 due to steroids. Pt will need insulin outpt and needs education on CBG monitoring and insulin administration - Increase Lantus to 15 units QHS - Novolog mealtime 5U TID qAC + SSI-m - CBG checks 4 times daily  Candidal infection: minimal yeast noted in posterior OP. Will send w/ Nystatin suspension at DC.  Diet: Card modified  DVT Ppx: Heparin SQ Dispo: Discharge today.  Holley Raring, MD Internal Medicine Resident, PGY-1 06/22/2016, 7:04 AM Pager: [phone]

## 2016-06-22 NOTE — Care Management Note (Signed)
Case Management Note  Patient Details  Name: Jaime [name] MRN: [id number] Date of Birth: 09/22/1965  Subjective/Objective:                    Action/Plan: Pt did not d/c last pm. Pt is discharging home today. HH set up with [facility]. Family transporting home. No further needs per CM.  Expected Discharge Date:  06/22/16               Expected Discharge Plan:  [place]  In-House Referral:     Discharge planning Services  CM Consult  Post Acute Care Choice:  Home Health Choice offered to:  Patient, Parent  DME Arranged:    DME Agency:     HH Arranged:  RN, PT, OT, Nurse's Aide (Pt will not receive PT/OT with her diagnosis and Medicaid) [place] Agency:  [place]  Status of Service:  Completed, signed off  If discussed at [place] of Stay Meetings, dates discussed:    Additional Comments:  Pollie Friar, RN 06/22/2016, 11:48 AM

## 2016-06-22 NOTE — Progress Notes (Signed)
Discharge orders received.  Discharge instructions and follow-up appointments reviewed with the patient and her mother. All belongings sent with the patient.  VSS upon discharge.  IV removed and education complete.  Transported out via wheelchair.   Cori Razor, RN

## 2016-06-22 NOTE — Progress Notes (Addendum)
Transitions of Care Pharmacy Note  Plan:  Educated on new medications: keppra, dexamethasone, pepcid, insulins, and Temodar  F/u with inpatient IM team re: lisinopril dose  Outpatient follow-up: adherence and CBGs --------------------------------------------- Jaime [name] is an 51 y.o. female who presents with a chief complaint of headache. In anticipation of discharge, pharmacy has reviewed this patient's prior to admission medication history, as well as current inpatient medications listed per the [facility].  Current medication indications, dosing, frequency, and notable side effects reviewed with patient and family. patient and family verbalized understanding of current inpatient medication regimen and is aware that the After Visit Summary when presented, will represent the most accurate medication list at discharge.   Jaime [name] did not express any concerns regarding her medications.  Discussed new medications dexamethasone, pepcid, keppra, insulin, and Temodar with patient and family. Diabetes education had already spoken to patient about insulin regimen and per patient, providers had already discussed the Temodar. We went over each medication and the patient's mother (who manages her medications) did not have any questions or concerns regarding her new regimen. I encouraged the patient and her family to discuss her new regimen with the Pharmacist when picking up her new prescriptions. Patient and family report no further questions at the conclusion of our visit.   Addendum: spoke with IM resident re: lisinopril dose (20 mg inpt vs. 40 mg outpt). Resident to reduce dose to 20 mg daily at discharge.   Assessment: Understanding of regimen: good Understanding of indications: good Potential of compliance: good  Patient instructed to contact inpatient pharmacy team with further questions or concerns if needed.    Time spent preparing for discharge counseling: 10 min  Time spent counseling  patient: 15 min    Argie Ramming, PharmD Pharmacy Resident  Pager [phone] 06/22/16 9:00 AM

## 2016-06-27 ENCOUNTER — Ambulatory Visit
Admission: RE | Admit: 2016-06-27 | Discharge: 2016-06-27 | Disposition: A | Discharge status: Home / Self Care | Payer: Medicaid Other | Source: Ambulatory Visit | Attending: Radiation Oncology | Admitting: Radiation Oncology

## 2016-06-27 ENCOUNTER — Ambulatory Visit: Payer: Medicaid Other | Admitting: Radiation Oncology

## 2016-06-27 ENCOUNTER — Telehealth: Payer: Self-pay

## 2016-06-27 ENCOUNTER — Telehealth: Payer: Self-pay | Admitting: Pharmacist

## 2016-06-27 ENCOUNTER — Encounter: Payer: Self-pay | Admitting: *Deleted

## 2016-06-27 ENCOUNTER — Other Ambulatory Visit: Payer: Medicaid Other

## 2016-06-27 ENCOUNTER — Ambulatory Visit (HOSPITAL_BASED_OUTPATIENT_CLINIC_OR_DEPARTMENT_OTHER): Payer: Medicaid Other | Admitting: Oncology

## 2016-06-27 VITALS — BP 109/70 | HR 106 | Temp 98.0°F | Resp 20 | Ht 62.5 in | Wt 157.4 lb

## 2016-06-27 VITALS — BP 103/59 | HR 93 | Temp 97.6°F | Resp 18 | Ht 65.5 in | Wt 157.4 lb

## 2016-06-27 DIAGNOSIS — Z7982 Long term (current) use of aspirin: Secondary | ICD-10-CM | POA: Diagnosis not present

## 2016-06-27 DIAGNOSIS — C719 Malignant neoplasm of brain, unspecified: Secondary | ICD-10-CM

## 2016-06-27 DIAGNOSIS — Z8249 Family history of ischemic heart disease and other diseases of the circulatory system: Secondary | ICD-10-CM | POA: Diagnosis not present

## 2016-06-27 DIAGNOSIS — Z818 Family history of other mental and behavioral disorders: Secondary | ICD-10-CM | POA: Diagnosis not present

## 2016-06-27 DIAGNOSIS — Z803 Family history of malignant neoplasm of breast: Secondary | ICD-10-CM | POA: Diagnosis not present

## 2016-06-27 DIAGNOSIS — Z90710 Acquired absence of both cervix and uterus: Secondary | ICD-10-CM | POA: Diagnosis not present

## 2016-06-27 DIAGNOSIS — Z79899 Other long term (current) drug therapy: Secondary | ICD-10-CM | POA: Diagnosis not present

## 2016-06-27 DIAGNOSIS — R112 Nausea with vomiting, unspecified: Secondary | ICD-10-CM | POA: Diagnosis not present

## 2016-06-27 DIAGNOSIS — Z801 Family history of malignant neoplasm of trachea, bronchus and lung: Secondary | ICD-10-CM | POA: Diagnosis not present

## 2016-06-27 DIAGNOSIS — Z510 Encounter for antineoplastic radiation therapy: Secondary | ICD-10-CM | POA: Diagnosis not present

## 2016-06-27 DIAGNOSIS — D496 Neoplasm of unspecified behavior of brain: Secondary | ICD-10-CM

## 2016-06-27 DIAGNOSIS — R51 Headache: Secondary | ICD-10-CM

## 2016-06-27 DIAGNOSIS — C712 Malignant neoplasm of temporal lobe: Secondary | ICD-10-CM

## 2016-06-27 DIAGNOSIS — F40240 Claustrophobia: Secondary | ICD-10-CM | POA: Diagnosis not present

## 2016-06-27 DIAGNOSIS — Z794 Long term (current) use of insulin: Secondary | ICD-10-CM | POA: Diagnosis not present

## 2016-06-27 DIAGNOSIS — E119 Type 2 diabetes mellitus without complications: Secondary | ICD-10-CM | POA: Diagnosis not present

## 2016-06-27 DIAGNOSIS — K219 Gastro-esophageal reflux disease without esophagitis: Secondary | ICD-10-CM | POA: Diagnosis not present

## 2016-06-27 MED ORDER — TEMOZOLOMIDE 140 MG PO CAPS: 140.0000 mg | ORAL_CAPSULE | Freq: Every day | ORAL | 0 refills | Status: DC

## 2016-06-27 MED ORDER — DEXAMETHASONE 6 MG PO TABS: 6.0000 mg | ORAL_TABLET | Freq: Three times a day (TID) | ORAL | 0 refills | Status: DC

## 2016-06-27 MED FILL — TEMOZOLOMIDE 140 MG CAP: 140 | 30 days supply | Qty: 30 | Fill #0

## 2016-06-27 NOTE — Telephone Encounter (Signed)
Oral Chemotherapy Pharmacist Encounter  Received new Temodar prescription for the treatment of glioblastoma Labs from may 2018 reviewed, OK for treatment Current medication list in Epic assessed, no DDIs with Temodar identified  Noted medicaid prescription insurance, prescription will be e-scribed to [facility] for benefits analysis  Oral Oncology Clinic will continue to follow.  Johny Drilling, PharmD, BCPS, BCOP 06/27/2016  11:01 AM Oral Oncology Clinic [phone]

## 2016-06-27 NOTE — Patient Instructions (Signed)
Decrease Dexamethasone (steroid) to one tablet three times daily.

## 2016-06-27 NOTE — Telephone Encounter (Signed)
Jaime [name] from [facility] want to informed the doctor, she went out to see pt on Sunday 5/27 for skilled nursing.

## 2016-06-27 NOTE — Progress Notes (Signed)
Radiation Oncology         [phone] ________________________________  Name: Jaime [name] MRN: [id number]         Date:   06/27/16             DOB: 30-Mar-1965  CC:Jaime [name], Rodman Key, MD  No ref. provider found     REFERRING PHYSICIAN: No ref. provider found   DIAGNOSIS: The primary encounter diagnosis was Brain tumor [facility]).  HISTORY OF PRESENT ILLNESS: Jaime [name] is a 51 y.o.  female seen in follow up for a new putative diagnosis of Glioblastoma multiforme. Jaime [name] was doing well up until a few weeks ago when she started having  progressive headaches, dizziness, and intractable nausea and vomiting. She presented to an urgent care and was sent for ED evaluation on 06/17/16 where a CT scan of the brain revealed a midline shift, and an MRI on 06/18/16 revealed a 6 x 4.8 x 3 cm mass in the left temporal lobe with mass effect and rightward midline shift measuring 11 mc with early subfalcine herniation. Mass effect on the left lateral ventricle with mildly dialated appearance of the right lateral ventricle was noted and the mass was centrally necrotic, peripherally enhancing with surrounding vasogenic edema concerning for GBM. The patient and her family have met with Dr. Ronnald Ramp in Neurosurgery, and he has recommended against surgical resection as he's concerned that this would not be a gross total resection, and could leave the patient with more deficits than what she's currently experiencing. Recommendations have been made to forgo additional tissue sampling due to associated risks, and to offer the patient a shorter course of radiotherapy with concurrent temodar. The patient has a cognitive delay and does not make her own medical decisions. CT of the chest/abdomen/pelvis on 06/20/16 this revealed no evidence of a primary malignancy or metastatic disease in the chest, abdomen, or pelvis. The patient will be seen by Dr. Benay Spice later today, and comes to review the recommendations for  radiotherapy to the brain.  PREVIOUS RADIATION THERAPY: No  Past Medical History:  Past Medical History:  Diagnosis Date  . Candida, oral 06/22/2016   Due to steroids 06/22/16  . Diabetes mellitus   . DUB (dysfunctional uterine bleeding)    total abdominal hysterectomy in 2007, ovaries intact  . Furuncle of multiple sites    multiple I and D's  . GERD (gastroesophageal reflux disease)   . Hydradenitis   . Intellectual disability   . 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 ABDOMINAL HYSTERECTOMY    . HYDRADENITIS EXCISION    . TONSILLECTOMY AND ADENOIDECTOMY     in early childhood    Social History:  Social History   Social History  . Marital status: Single    Spouse name: N/A  . Number of children: N/A  . Years of education: N/A   Occupational History  . Not on file.   Social History Main Topics  . Smoking status: Never Smoker  . Smokeless tobacco: Never Used  . Alcohol use No  . Drug use: No  . Sexual activity: Not Currently    Birth control/ protection: Surgical   Other Topics Concern  . 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   No kids.   No sexual activity   Lives  with her mother    Family History: Family History  Problem Relation Age of Onset  . Cancer Mother 61       Breast cancer  . Hypertension Mother   . Depression Mother   . Sickle cell trait Mother   . Cancer Father 67       lung cancer, was a long term smoker     ALLERGIES: Patient has no known allergies.   MEDICATIONS:   Current Outpatient Prescriptions:  .  acetaminophen (TYLENOL) 500 MG tablet, Take 500 mg by mouth every 8 (eight) hours as needed., Disp: , Rfl:  .  aspirin EC 81 MG tablet, Take 81 mg by mouth daily., Disp: , Rfl:  .  blood glucose meter kit and  supplies, Dispense based on patient and insurance preference. Use up to four times daily as directed. (FOR ICD-9 250.00, 250.01)., Disp: 1 each, Rfl: 0 .  dexamethasone (DECADRON) 6 MG tablet, Take 1 tablet (6 mg total) by mouth 4 (four) times daily., Disp: 120 tablet, Rfl: 0 .  famotidine (PEPCID) 20 MG tablet, Take 1 tablet (20 mg total) by mouth 2 (two) times daily., Disp: 30 tablet, Rfl: 0 .  fluticasone (FLONASE) 50 MCG/ACT nasal spray, Place 2 sprays into both nostrils daily., Disp: 15.8 g, Rfl: 2 .  insulin aspart (NOVOLOG FLEXPEN) 100 UNIT/ML FlexPen, Inject 5 Units into the skin 3 (three) times daily with meals., Disp: 15 mL, Rfl: 0 .  insulin glargine (LANTUS) 100 unit/mL SOPN, Inject 0.15 mLs (15 Units total) into the skin at bedtime., Disp: 15 mL, Rfl: 0 .  Insulin Pen Needle 31G X 5 MM MISC, Use for injections under the skin as directed., Disp: 200 each, Rfl: 0 .  loratadine (CLARITIN) 10 MG tablet, Take 1 tablet (10 mg total) by mouth daily., Disp: 30 tablet, Rfl: 2 .  metFORMIN (GLUCOPHAGE) 1000 MG tablet, Take 1 tablet (1,000 mg total) by mouth 2 (two) times daily with a meal., Disp: 180 tablet, Rfl: 2 .  montelukast (SINGULAIR) 10 MG tablet, Take 1 tablet (10 mg total) by mouth at bedtime., Disp: 30 tablet, Rfl: 2 .  nystatin (MYCOSTATIN) 100000 UNIT/ML suspension, Take 5 mLs (500,000 Units total) by mouth 4 (four) times daily., Disp: 60 mL, Rfl: 0 .  ondansetron (ZOFRAN ODT) 4 MG disintegrating tablet, Take 1 tablet (4 mg total) by mouth every 8 (eight) hours as needed for nausea or vomiting., Disp: 20 tablet, Rfl: 0 .  pravastatin (PRAVACHOL) 20 MG tablet, TAKE 1 TABLET BY MOUTH EVERY EVENING, Disp: 90 tablet, Rfl: 0 .  levETIRAcetam (KEPPRA) 500 MG tablet, Take 1 tablet (500 mg total) by mouth 2 (two) times daily., Disp: 60 tablet, Rfl: 0 .  lisinopril (PRINIVIL,ZESTRIL) 40 MG tablet, Take 1 tablet (40 mg total) by mouth daily., Disp: 30 tablet, Rfl: 3 .  LORazepam (ATIVAN) 0.5 MG  tablet, 1 tablet po 30 minutes prior to radiation or MRI, Disp: 30 tablet, Rfl: 0 .  temozolomide (TEMODAR) 140 MG capsule, Take 1 capsule (140 mg total) by mouth daily. May take on an empty stomach or at bedtime to decrease nausea & vomiting. (Patient not taking: Reported on 06/27/2016), Disp: 42 capsule, Rfl: 0   REVIEW OF SYSTEMS: On review of systems, the patient reports that she is doing well overall. She denies any chest pain, shortness of breath, cough, fevers, chills, night sweats, unintended weight changes. She denies any bowel or bladder disturbances, and denies abdominal pain, nausea or vomiting. She reports  her headaches have disappeared, and she's not nauseated though she has medication for this at home. She continues on Dexamethasone 6 mg QID, and pepcid. She denies any new musculoskeletal or joint aches or pains, new skin lesions or concerns. A complete review of systems is obtained and is otherwise negative.    PHYSICAL EXAM:  Wt Readings from Last 3 Encounters:  06/27/16 157 lb 6.4 oz (71.4 kg)  06/27/16 157 lb 6.4 oz (71.4 kg)  06/22/16 166 lb 3.2 oz (75.4 kg)   Temp Readings from Last 3 Encounters:  06/27/16 97.6 F (36.4 C) (Oral)  06/27/16 98 F (36.7 C) (Oral)  06/22/16 98.5 F (36.9 C) (Oral)   BP Readings from Last 3 Encounters:  06/27/16 (!) 103/59  06/27/16 109/70  06/22/16 124/84   Pulse Readings from Last 3 Encounters:  06/27/16 93  06/27/16 (!) 106  06/22/16 100   Pain Assessment Pain Score: 0-No pain/10   In general this is a well appearing African American female in no acute distress. She's alert and oriented x4 and appropriate throughout the examination. Cardiopulmonary assessment is negative for acute distress and she exhibits normal effort.    ECOG = 1   0 - Asymptomatic (Fully active, able to carry on all predisease activities without restriction)  1 - Symptomatic but completely ambulatory (Restricted in physically strenuous  activity but ambulatory and able to carry out work of a light or sedentary nature. For example, light housework, office work)  2 - Symptomatic, <50% in bed during the day (Ambulatory and capable of all self care but unable to carry out any work activities. Up and about more than 50% of waking hours)  3 - Symptomatic, >50% in bed, but not bedbound (Capable of only limited self-care, confined to bed or chair 50% or more of waking hours)  4 - Bedbound (Completely disabled. Cannot carry on any self-care. Totally confined to bed or chair)  5 - Death   Eustace Pen MM, Creech RH, Tormey DC, et al. [phone]). "Toxicity and response criteria of the [facility]". [place] Oncol. 5 (6): 649-55     IMPRESSION/PLAN: 1.         Putative Glioblastoma Multiforme. Dr. Lisbeth Renshaw met with the patient and her mother and aunt to discuss options of palliative radiotherapy. We discussed the risks, benefits, short, and long term effects of therapy along with the delivery and logistics of 3 weeks of therapy. Written consent is obtained and placed in the chart, a copy provided to the patient. 2.         Claustrophobia. The patient has shown evidence of claustrophobia with her MRI and will continue to use Ativan prn for her treatments as well as for simulation today. 3.         Advanced Directives. We will refer the patient for social work consultation and appreciate their assistance in this as well as for helping the patient obtain a wig.   In a visit lasting 45 minutes, greater than 50% of the time was spent face to face discussing options of treatment, and coordinating the patient's care.  The above documentation reflects my direct findings during this shared patient visit. Please see the separate note by Dr. Lisbeth Renshaw on this date for the remainder of the patient's plan of care.     Carola Rhine, PAC  This document serves as a record of services personally performed by Kyung Rudd, MD  and Shona Simpson, PA-C. It was created on their  behalf by Bethann Humble, a trained medical scribe. The creation of this record is based on the scribe's personal observations and the provider's statements to them. This document has been checked and approved by the attending provider.

## 2016-06-27 NOTE — Progress Notes (Signed)
Please see the Nurse Progress Note in the MD Initial Consult Encounter for this patient. 

## 2016-06-27 NOTE — Telephone Encounter (Signed)
Called [name] with [facility]-no issues at this time-she wanted to let pcp know.  Phone call complete.Jaime [name], Jaime [name] Cassady5/29/201810:54 AM  No further action needed at this time

## 2016-06-27 NOTE — Progress Notes (Signed)
[place] OFFICE PROGRESS NOTE   Diagnosis: Brain tumor  INTERVAL HISTORY:   [name] is well-known to me with a history of sickle cell trait and iron deficiency anemia. She was last seen at the Cancer center greater than 2 years ago. She was admitted at [facility] with headaches and nausea on 06/17/2016. A brain CT revealed evidence of an intra-axial mass in the central left occipital/temporal lobe with surrounding edema and midline shift. An MRI of the brain 06/18/2016 revealed a peripherally enhancing mass in the left temporal lobe measuring 6 x 4.8 x 3 cm with surrounding edema. Mass effect with right midline shift with early subfalcine herniation. Mass effect on the left lateral ventricle with mild dilation of the right lateral ventricle. The MRI findings were most consistent with a high-grade glioma.  Dr. Ronnald Ramp was consulted. Surgery was not recommended as the tumor cannot be completely resected. She was seen in consultation by Drs. Moody and Woodruff. CTs of the chest, abdomen, and pelvis on 06/20/2016 revealed no evidence of a primary malignancy or metastatic disease. Palliative radiation and temozolomide is recommended. She has undergone radiation simulation and is scheduled to begin radiation on 07/03/2016.  The nausea and headache have improved since she was placed on Decadron.   Review of systems: Positives: Anorexia, 15 pound weight loss, nausea/vomiting prior to hospital admission 06/17/2016 A complete review of systems was otherwise negative   Objective:  Vital signs in last 24 hours:  Blood pressure (!) 103/59, pulse 93, temperature 97.6 F (36.4 C), temperature source Oral, resp. rate 18, height 5' 5.5" (1.664 m), weight 157 lb 6.4 oz (71.4 kg), last menstrual period 10/31/2005, SpO2 100 %.    HEENT: No thrush Lymphatics: No cervical, supraclavicular, axillary, or inguinal nodes Resp: Lungs clear bilaterally Cardio: Regular rate and rhythm GI: No  hepatosplenomegaly, nontender, no mass Vascular: No leg edema Neuro: Alert, follows commands, the motor exam appears intact in the upper and lower extremities. The face is symmetric  Skin: No rash     Lab Results:  Lab Results  Component Value Date   WBC 9.5 06/20/2016   HGB 10.7 (L) 06/20/2016   HCT 32.6 (L) 06/20/2016   MCV 83.2 06/20/2016   PLT 297 06/20/2016   NEUTROABS 4.4 01/27/2014    CMP     Component Value Date/Time   NA 140 06/20/2016 0438   NA 140 12/27/2015 1532   K 3.7 06/20/2016 0438   CL 105 06/20/2016 0438   CO2 26 06/20/2016 0438   GLUCOSE 170 (H) 06/20/2016 0438   BUN 11 06/20/2016 0438   BUN 7 12/27/2015 1532   CREATININE 0.46 06/20/2016 0438   CREATININE 0.43 (L) 07/07/2013 1529   CALCIUM 9.4 06/20/2016 0438   PROT 7.3 06/17/2016 2202   ALBUMIN 3.9 06/17/2016 2202   AST 15 06/17/2016 2202   ALT 9 (L) 06/17/2016 2202   ALKPHOS 50 06/17/2016 2202   BILITOT 0.5 06/17/2016 2202   GFRNONAA >60 06/20/2016 0438   GFRNONAA >89 07/07/2013 1529   GFRAA >60 06/20/2016 0438   GFRAA >89 07/07/2013 1529     Imaging:MRI brain 06/18/2016-images reviewed   Medications: I have reviewed the patient's current medications.  Assessment/Plan: 1. History of iron deficiency anemia. Mild normocytic anemia during hospital admission May 2018 2. Sickle cell trait. 3. Status post hysterectomy. 4. History of dysphagia. She underwent an endoscopy by Dr. Collene Mares.  5. Diabetes 6. Centrally necrotic, enhancing left temporal mass on MRI brain 06/18/2016-imaging features consistent  with a high-grade glioma  CTs chest, abdomen, and pelvis 06/20/2016-no evidence of a primary tumor site or metastatic disease 7. Nausea/vomiting and headaches secondary to #6   Disposition:  Jaime [name] presented with headaches and nausea/vomiting. There is an enhancing mass in the left temporal lobe with surrounding edema and mass effect. The clinical presentation is most consistent with a  high-grade glioma. The tumor cannot be completely resected.  She has been seen in consultation by neurosurgery and radiation oncology. The plan is to proceed with a course of palliative radiation. I recommend concurrent temozolomide. Dr. Lisbeth Renshaw plans an abbreviated course of radiation. She will take temozolomide daily throughout the course of radiation. We reviewed the potential toxicities associated with temozolomide including the chance for nausea, mucositis, diarrhea, rash, hepatitis, and hematologic toxicity. We also discussed the chance for atypical infections while treated with temozolomide and Decadron. She will attend a chemotherapy teaching class today.  I discussed the prognosis with [name] in her mother. They understand no therapy will be curative. We will plan for additional monthly temozolomide at the completion of radiation.  We can could consider a biopsy to confirm the diagnosis and give prognostic information. However it is very unlikely a biopsy would change the treatment plan. I recommend proceeding with temozolomide/radiation.   She had mild normocytic anemia on hospital admission 06/17/2016. She denies bleeding. We will check a CBC and ferritin level when she returns on 07/03/2016. I think it is unlikely the mild anemia is related to the brain tumor diagnosis.  I discussed the case with Dr. Lisbeth Renshaw. The Decadron will be tapered to 6 mg 3 times per day.  40 minutes were spent with the patient today. The majority of the time was used for counseling and coordination of care.   Donneta Romberg, MD  06/27/2016  4:32 PM

## 2016-06-28 ENCOUNTER — Telehealth: Payer: Self-pay | Admitting: *Deleted

## 2016-06-28 ENCOUNTER — Encounter: Payer: Self-pay | Admitting: Internal Medicine

## 2016-06-28 ENCOUNTER — Ambulatory Visit (INDEPENDENT_AMBULATORY_CARE_PROVIDER_SITE_OTHER): Payer: Medicaid Other | Admitting: Internal Medicine

## 2016-06-28 VITALS — BP 98/61 | HR 86 | Temp 98.4°F | Wt 158.7 lb

## 2016-06-28 DIAGNOSIS — Z794 Long term (current) use of insulin: Secondary | ICD-10-CM

## 2016-06-28 DIAGNOSIS — E11319 Type 2 diabetes mellitus with unspecified diabetic retinopathy without macular edema: Secondary | ICD-10-CM

## 2016-06-28 DIAGNOSIS — D496 Neoplasm of unspecified behavior of brain: Secondary | ICD-10-CM | POA: Diagnosis not present

## 2016-06-28 DIAGNOSIS — N764 Abscess of vulva: Secondary | ICD-10-CM | POA: Diagnosis not present

## 2016-06-28 MED ORDER — INSULIN GLARGINE 100 UNITS/ML SOLOSTAR PEN: 17.0000 [IU] | PEN_INJECTOR | Freq: Every day | SUBCUTANEOUS | 0 refills | Status: DC

## 2016-06-28 NOTE — Patient Instructions (Signed)
It was very nice meeting you today! I'm glad you're doing well!  For your irritation, make sure to keep the area clear and use some warm compresses a couple of times a day. If you get any new symptoms like pain with urination or vaginal discharge, please let us know - keep your appointment with Dr. Inda Castle next week.  Increase your Lantus dose to 17 units at night and keep checking your blood sugars first things in the morning, before meals and at night.

## 2016-06-28 NOTE — Progress Notes (Signed)
CC: genital discomfort  HPI:  [name] is a 51 y.o. 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  Patient states that she noticed some tenderness and swelling in her genital area this morning. She and family state that she has had abscesses drained multiple times in her genital area and elsewhere but none in years. Patient denies drainage from site, vaginal itching, vaginal discharge, dysuria, hematuria, increased frequency or urgency, fevers. She denies being sexually active.   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 She was started on decadron and keppra for seizure prophylaxis. with likely glioblastoma. CT chest, abd and pelvis did not reveal primary malignancy or metastatic disease. She has followed up with Dr. Benay Spice with oncology yesterday - she will start temozolomide/radiation next week. Her  Decadron was tapered to 6mg  TID. Patient and family deny new neurological symptoms; she denies further nausea, vomiting and has not had any further falls.  DM: Patient with DM worsened with addition of steroids during hospitalization. She was discharged on lantus 15 units qhs, and novolog 5 units TID with meals; she was continued on her metformin 1000mg  BID. Family states that her AM glucose is usually in the 180's; daytime CBGs are usually 140s. She denies hypoglycemic episodes.  Please see problem based Assessment and Plan for status of patients chronic conditions.  Past Medical History:  Diagnosis Date  . Candida, oral 06/22/2016   Due to steroids 06/22/16  . Diabetes mellitus   . DUB (dysfunctional uterine bleeding)    total  abdominal hysterectomy in 2007, ovaries intact  . Furuncle of multiple sites    multiple I and D's  . GERD (gastroesophageal reflux disease)   . Hydradenitis   . Intellectual disability   . 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  HENT: Negative for hearing loss.   Eyes: Negative for blurred vision, double vision and photophobia.  Respiratory: Negative for shortness of breath.   Cardiovascular: Negative for chest pain.  Gastrointestinal: Negative for abdominal pain, constipation, diarrhea, nausea and vomiting.  Genitourinary: Negative for dysuria, frequency, hematuria and urgency.  Musculoskeletal: Negative for myalgias.  Skin: Negative for rash.  Neurological: Negative for dizziness, tingling, sensory change, speech change, focal weakness, seizures, loss of consciousness, weakness and headaches.    Physical Exam:  Vitals:   06/28/16 1519  BP: 98/61  Pulse: 86  Temp: 98.4 F (36.9 C)  TempSrc: Oral  Weight: 158 lb 11.2 oz (72 kg)   Physical Exam  Constitutional: She is oriented to person, place, and time. She appears well-developed and well-nourished. No distress.  HENT:  Head: Normocephalic and atraumatic.  Eyes: EOM are normal. Pupils are equal, round, and reactive to light.  Cardiovascular: Normal rate, regular rhythm, normal heart sounds and intact distal pulses.  Exam reveals no gallop and no friction rub.   No murmur heard. Pulmonary/Chest: Effort normal and breath sounds normal. She has no rales.  Abdominal: Soft. Bowel sounds are normal. There is no tenderness.  Genitourinary: No vaginal discharge found.  Genitourinary Comments: ~0.5cm area of redness with mild induration on left labia majora that  is TTP; no fluctuance noted; no drainage. No vaginal discharge noted.  Musculoskeletal:  Dependent edema in bil LE - chronic  Neurological: She is alert  and oriented to person, place, and time. No cranial nerve deficit or sensory deficit. Coordination normal.  Skin: Skin is warm and dry. Capillary refill takes less than 2 seconds. She is not diaphoretic.  Psychiatric: She has a normal mood and affect. Her behavior is normal. Judgment and thought content normal.    Assessment & Plan:   See Encounters Tab for problem based charting.   Patient discussed with Dr. Angelia Mould   Alphonzo Grieve, MD Internal Medicine PGY1

## 2016-06-28 NOTE — Telephone Encounter (Signed)
[facility] calls and states pt was seen today in home and is scheduled to start chemo/ rad treatment next week, pt reported an open area on her labia that is very painful and draining, HHN feels it should be evaluated before treatments start, as of now no reported fevers. appt today Dr Jari Favre.at 1500, pt feels more comfortable w/ female md for this problem

## 2016-06-28 NOTE — Telephone Encounter (Signed)
"  Orlena Sheldon RN with [facility] calling to notify staff Ms. Hankey has an area on her labia which opened up and is draining.  Not sure if she needs antibiotic or what.  She is to begin radiation tomorrow and do not want you all to have any surprises.  Please call the patient with any orders or further instructions."  Will notify medical and radiation oncology.

## 2016-06-28 NOTE — Telephone Encounter (Signed)
Returned call to patient mother, patient has an appt with MD  Dr. Dorise Bullion today at 3pm, just wanted mother to know the area on the patient labia will not affect her radiation  To treatment area of her brain,  Mother thanked this RN for the call 2:23 PM

## 2016-06-28 NOTE — Telephone Encounter (Signed)
Please see if Jaime [name] in Radiation can evaluate her

## 2016-06-29 ENCOUNTER — Encounter: Payer: Self-pay | Admitting: Internal Medicine

## 2016-06-29 ENCOUNTER — Telehealth: Payer: Self-pay | Admitting: Oncology

## 2016-06-29 DIAGNOSIS — N764 Abscess of vulva: Secondary | ICD-10-CM | POA: Insufficient documentation

## 2016-06-29 DIAGNOSIS — Z510 Encounter for antineoplastic radiation therapy: Secondary | ICD-10-CM | POA: Diagnosis not present

## 2016-06-29 NOTE — Assessment & Plan Note (Signed)
Patient with DM worsened with addition of steroids during hospitalization. She was discharged on lantus 15 units qhs, and novolog 5 units TID with meals; she was continued on her metformin 1000mg  BID. Family states that her AM glucose is usually in the 180's; daytime CBGs are usually 140s. She denies hypoglycemic episodes.  Plan: --increase Lantus to 17 units qhs --continue novolog 5 units TID, metformin 1000mg  BID --has f/u with PCP next week

## 2016-06-29 NOTE — Assessment & Plan Note (Addendum)
Lab Results  Component Value Date   HGBA1C 6.9 07/03/2016   HGBA1C 6.7 03/27/2016   HGBA1C 6.6 12/27/2015    Recent Labs  07/03/16 1507  GLUCAP 175*    Current medications: metformin 1000 mg BID Current insulin: Lantus 17U QHS, NovoLog 5U TID  Has been taking high dose corticosteroids for cerebral edema for past 2 weeks, now down to dexamethasone 6 mg TID.  Assessment HgbA1c goal: <7.0 Glycemic control: Higher blood sugars on steroids Complications: mild bilateral nonproliferative retinopathy  Plan Medications: continue current meds Insulin: continue current insulin Other:  -Bring glucometer to clinic for download and insulin titration  ADDENDUM 07/04/2016 Ms. Justiniano brought her glucometer by the clinic for download. I reviewed her home CBGs: AM fasting average ~150 Midday and PM ~200 No hypoglycemic readings  Blood sugars are adequately controlled with the primary goal of being avoiding hypoglycemic episodes and symptomatic hyperglycemia. -Continue current insulin

## 2016-06-29 NOTE — Assessment & Plan Note (Addendum)
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 She was started on decadron and keppra for seizure prophylaxis. with likely glioblastoma. CT chest, abd and pelvis did not reveal primary malignancy or metastatic disease. She has followed up with Dr. Benay Spice with oncology yesterday - she will start temozolomide/radiation next week. Her  Decadron was tapered to 6mg  TID. Patient and family deny new neurological symptoms; she denies further nausea, vomiting and has not had any further falls.  Plan: --patient will continue to follow up with rad onc and onc --plans to start temozolomide/radiation next week --decadron 6mg  TID --keppra 500mg  BID for seizure prophylaxis

## 2016-06-29 NOTE — Telephone Encounter (Signed)
Patient bypassed scheduling on 06/27/16. Called patient to confirm and schedule appointment in addition to Rad/Onc appt that is scheduled for the same date. Left message on patient voicemail with appointment details. Appointment letter and schedule mailed to patient, per 06/27/16 los.

## 2016-06-29 NOTE — Progress Notes (Signed)
   CC: "I'm feeling really tired."  HPI:  JaimeJaime [name] is a 51 y.o. woman with HTN, DM2, and recently diagnosed brain mass (likely GBM) who presents for management of diabetes.  Please see A&P for status of the patient's chronic medical conditions.   Past Medical History:  Diagnosis Date  . Candida, oral 06/22/2016   Due to steroids 06/22/16  . Diabetes mellitus   . DUB (dysfunctional uterine bleeding)    total abdominal hysterectomy in 2007, ovaries intact  . Furuncle of multiple sites    multiple I and D's  . GERD (gastroesophageal reflux disease)   . Hydradenitis   . Intellectual disability   . 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 Negative for fever.  HENT: Negative for sore throat.   Eyes: Negative for double vision and pain.  Neurological: Positive for headaches. Negative for sensory change, focal weakness, seizures and loss of consciousness.  Psychiatric/Behavioral: Negative for depression. The patient is not nervous/anxious.      Physical Exam:  Vitals:   07/03/16 1442  BP: 129/76  Pulse: (!) 113  Temp: 97.8 F (36.6 C)  TempSrc: Oral  SpO2: 99%  Weight: 163 lb 6.4 oz (74.1 kg)  Height: 5' 5.5" (1.664 m)     Physical Exam  Constitutional: She is oriented to person, place, and time.  Tired appearing, sitting in chair resting head on her hands, in no acute distress  HENT:  Head: Normocephalic and atraumatic.  Cardiovascular: Normal rate and regular rhythm.   Pulmonary/Chest: Effort normal and breath sounds normal.  Neurological: She is alert and oriented to person, place, and time. No cranial nerve deficit or sensory deficit. She exhibits normal muscle tone.  Psychiatric: She has a normal mood and affect. Her behavior is normal.    Assessment & Plan:   See Encounters Tab for problem based charting.  Patient discussed with Dr. Daryll Drown

## 2016-06-29 NOTE — Assessment & Plan Note (Signed)
Patient states that she noticed some tenderness and swelling in her genital area this morning. She and family state that she has had abscesses drained multiple times in her genital area and elsewhere but none in years. Patient denies drainage from site, vaginal itching, vaginal discharge, dysuria, hematuria, increased frequency or urgency, fevers. She denies being sexually active.  On exam, there is a small area of induration on left labia majora; no fluctuance or drainage appreciated. No other sites of irritation that patient has noticed or that was noted on exam. Further pelvic exam was not indicated at this time as patient had no other symptoms. Her symptoms and exam consistent with furuncle.   Plan: --treat conservatively with maintaining good hygiene and applying warm compresses --patient has appt with PCP next week for f/u; discussed earlier return precautions

## 2016-06-29 NOTE — Assessment & Plan Note (Addendum)
BP Readings from Last 3 Encounters:  07/03/16 129/76  06/28/16 98/61  06/27/16 109/70    Lab Results  Component Value Date   CREATININE 0.46 06/20/2016   Lab Results  Component Value Date   K 3.7 06/20/2016    Current medications: lisinopril 40 mg daily  Assessment BP goal: 130/80 BP control: Well-controlled  Plan Medications: continue current meds Other:

## 2016-06-30 NOTE — Progress Notes (Signed)
Internal Medicine Clinic Attending  Case discussed with Dr. Svalina  at the time of the visit.  We reviewed the resident's history and exam and pertinent patient test results.  I agree with the assessment, diagnosis, and plan of care documented in the resident's note.  

## 2016-07-03 ENCOUNTER — Ambulatory Visit
Admission: RE | Admit: 2016-07-03 | Discharge: 2016-07-03 | Disposition: A | Discharge status: Home / Self Care | Payer: Medicaid Other | Source: Ambulatory Visit | Attending: Radiation Oncology | Admitting: Radiation Oncology

## 2016-07-03 ENCOUNTER — Ambulatory Visit (INDEPENDENT_AMBULATORY_CARE_PROVIDER_SITE_OTHER): Payer: Medicaid Other | Admitting: Internal Medicine

## 2016-07-03 VITALS — BP 129/76 | HR 113 | Temp 97.8°F | Ht 65.5 in | Wt 163.4 lb

## 2016-07-03 DIAGNOSIS — E11319 Type 2 diabetes mellitus with unspecified diabetic retinopathy without macular edema: Secondary | ICD-10-CM

## 2016-07-03 DIAGNOSIS — Z79899 Other long term (current) drug therapy: Secondary | ICD-10-CM

## 2016-07-03 DIAGNOSIS — Z794 Long term (current) use of insulin: Secondary | ICD-10-CM | POA: Diagnosis not present

## 2016-07-03 DIAGNOSIS — G9389 Other specified disorders of brain: Secondary | ICD-10-CM

## 2016-07-03 DIAGNOSIS — D496 Neoplasm of unspecified behavior of brain: Secondary | ICD-10-CM

## 2016-07-03 DIAGNOSIS — I10 Essential (primary) hypertension: Secondary | ICD-10-CM

## 2016-07-03 DIAGNOSIS — B370 Candidal stomatitis: Secondary | ICD-10-CM

## 2016-07-03 DIAGNOSIS — Z510 Encounter for antineoplastic radiation therapy: Secondary | ICD-10-CM | POA: Diagnosis not present

## 2016-07-03 DIAGNOSIS — E785 Hyperlipidemia, unspecified: Secondary | ICD-10-CM | POA: Diagnosis not present

## 2016-07-03 LAB — POCT GLYCOSYLATED HEMOGLOBIN (HGB A1C): Hemoglobin A1C: 6.9

## 2016-07-03 LAB — GLUCOSE, CAPILLARY: GLUCOSE-CAPILLARY: 175 mg/dL — AB (ref 65–99)

## 2016-07-03 MED ORDER — LORATADINE 10 MG PO TABS: 10.0000 mg | ORAL_TABLET | Freq: Every day | ORAL | 2 refills | Status: DC

## 2016-07-03 MED ORDER — BIAFINE EX EMUL
Freq: Every day | CUTANEOUS | Status: DC
  Administered 2016-07-03: 13:00:00 via TOPICAL

## 2016-07-03 MED ORDER — FAMOTIDINE 20 MG PO TABS: 20.0000 mg | ORAL_TABLET | Freq: Two times a day (BID) | ORAL | 0 refills | Status: DC

## 2016-07-03 NOTE — Progress Notes (Addendum)
Pt education , radiation and therapy and you book, biafine cream, my business card given, discussed, head aches, nausea,vomiting, skin irritation, hair loss, eye irritation, , blurry vision, fatigue, hearing difficulty, increase protein in diet, stay hydrated, drink high calorie drinks, plenty water, use baby shampoo  Ever 3 days, no rubbing,scratching, or scratching  Head, luke warm showers/baths, dove soap unscented preferable,  When washing hair, , use biafine to scalp where rad tx is done daily after rad txs, sees MD weekly and prn, mom, patient and aunt gave verbal understanding, 1:13 PM

## 2016-07-03 NOTE — Patient Instructions (Addendum)
I'm glad he's been feeling better since he got out of the hospital. I'm sorry her so tired and having to spend so much time seen doctors.  If your headaches get worse, please call Dr. Benay Spice.  When you have a chance please bring your glucose meter by the clinic here and have someone download the information so I can take a look.  If he starts seeing blood sugars that are consistently over 300 at home, please call the clinic right away.

## 2016-07-04 ENCOUNTER — Ambulatory Visit
Admission: RE | Admit: 2016-07-04 | Discharge: 2016-07-04 | Disposition: A | Discharge status: Home / Self Care | Payer: Medicaid Other | Source: Ambulatory Visit | Attending: Radiation Oncology | Admitting: Radiation Oncology

## 2016-07-04 ENCOUNTER — Encounter: Payer: Self-pay | Admitting: Dietician

## 2016-07-04 DIAGNOSIS — Z510 Encounter for antineoplastic radiation therapy: Secondary | ICD-10-CM | POA: Diagnosis not present

## 2016-07-04 NOTE — Assessment & Plan Note (Addendum)
She has had intermittent headaches, but they've not been consistently worse in the last week since tapering down the dose of dexamethasone.  No seizures, focal weakness, or diplopia.  Started radiation with adjuvant temozolomide today with plan for treatment 5 days a week for 3 weeks.  -Continue Decadron, Keppra -Continue follow-up with radiation and medical oncology

## 2016-07-04 NOTE — Progress Notes (Addendum)
Internal Medicine Clinic Attending  Case discussed with Dr. Inda Castle at the time of the visit.  We reviewed the resident's history and exam and pertinent patient test results.  I agree with the assessment, diagnosis, and plan of care documented in the resident's note with the following exception:   For diabetics 40-75, it is recommended that they be on at least a moderate intensity statin,  however, given her new diagnosis of possible GBM, it is reasonable to stop this medication in the setting of palliative care which is ongoing.

## 2016-07-04 NOTE — Assessment & Plan Note (Signed)
She has heard that pravastatin causes dementia and is interested in changing this medication.  With her comorbidities I do not think that continuing statin therapy is necessary.  -Discontinue pravastatin per patient request

## 2016-07-04 NOTE — Assessment & Plan Note (Signed)
Resolved

## 2016-07-04 NOTE — Progress Notes (Signed)
Chart reviewed due to patient being on steroids and having diabetes. Note that Dr. Jenetta Downer' [place] addressed her blood sugars while on steroids yesterday. Dr. Maudie Mercury and I are available to assist as needed.

## 2016-07-05 ENCOUNTER — Ambulatory Visit: Admission: RE | Admit: 2016-07-05 | Payer: Medicaid Other | Source: Ambulatory Visit

## 2016-07-05 NOTE — Telephone Encounter (Signed)
Pt was seen 07/03/16 as scheduled for HFU.Regenia Skeeter, Owens Hara Cassady6/6/20189:06 AM

## 2016-07-06 ENCOUNTER — Ambulatory Visit
Admission: RE | Admit: 2016-07-06 | Discharge: 2016-07-06 | Disposition: A | Discharge status: Home / Self Care | Payer: Medicaid Other | Source: Ambulatory Visit | Attending: Radiation Oncology | Admitting: Radiation Oncology

## 2016-07-06 DIAGNOSIS — Z510 Encounter for antineoplastic radiation therapy: Secondary | ICD-10-CM | POA: Diagnosis not present

## 2016-07-07 ENCOUNTER — Other Ambulatory Visit: Payer: Self-pay | Admitting: *Deleted

## 2016-07-07 ENCOUNTER — Ambulatory Visit
Admission: RE | Admit: 2016-07-07 | Discharge: 2016-07-07 | Disposition: A | Discharge status: Home / Self Care | Payer: Medicaid Other | Source: Ambulatory Visit | Attending: Radiation Oncology | Admitting: Radiation Oncology

## 2016-07-07 ENCOUNTER — Encounter: Payer: Self-pay | Admitting: *Deleted

## 2016-07-07 DIAGNOSIS — I10 Essential (primary) hypertension: Secondary | ICD-10-CM

## 2016-07-07 DIAGNOSIS — Z510 Encounter for antineoplastic radiation therapy: Secondary | ICD-10-CM | POA: Diagnosis not present

## 2016-07-07 MED ORDER — LISINOPRIL 40 MG PO TABS: 40.0000 mg | ORAL_TABLET | Freq: Every day | ORAL | 1 refills | Status: DC

## 2016-07-10 ENCOUNTER — Other Ambulatory Visit: Payer: Self-pay | Admitting: Internal Medicine

## 2016-07-10 ENCOUNTER — Encounter: Payer: Self-pay | Admitting: *Deleted

## 2016-07-10 ENCOUNTER — Ambulatory Visit
Admission: RE | Admit: 2016-07-10 | Discharge: 2016-07-10 | Disposition: A | Discharge status: Home / Self Care | Payer: Medicaid Other | Source: Ambulatory Visit | Attending: Radiation Oncology | Admitting: Radiation Oncology

## 2016-07-10 DIAGNOSIS — Z510 Encounter for antineoplastic radiation therapy: Secondary | ICD-10-CM | POA: Diagnosis not present

## 2016-07-10 NOTE — Progress Notes (Signed)
[place] Psychosocial Distress Screening Clinical Social Work  Clinical Social Work was referred by distress screening protocol.  The patient scored a 5 on the Psychosocial Distress Thermometer which indicates moderate distress. Clinical Social Worker met with patient and patients mother in [place] office to complete advance directives and assess for distress and other psychosocial needs.  See previous note [facility]:RCVKFMM directives.  CSW offered additional support and encouraged patient to call with needs or concerns.     ONCBCN DISTRESS SCREENING 06/27/2016  Screening Type Initial Screening  Distress experienced in past week (1-10) 5  Emotional problem type Nervousness/Anxiety;Isolation/feeling alone;Boredom  Spiritual/Religous concerns type Relating to God  Physical Problem type Pain  Physician notified of physical symptoms Yes  Referral to clinical social work Yes    Johnnye Lana, MSW, LCSW, OSW-C Clinical Social Worker [place] [phone]

## 2016-07-10 NOTE — Progress Notes (Signed)
Jaime [name] Social Work  Clinical Social Work was referred by patient to review and complete healthcare advance directives.  Clinical Social Worker met with patient and patients mother in [place] office.  The patient designated Jaime [name] as their primary healthcare agent and Jaime [name] as their secondary agent.  Patient also completed healthcare living will.    Clinical Social Worker notarized documents and made copies for patient/family. Clinical Social Worker will send documents to medical records to be scanned into patient's chart. Clinical Social Worker encouraged patient/family to contact with any additional questions or concerns.  Jaime [name], MSW, LCSW, OSW-C Clinical Social Worker [facility] [phone]

## 2016-07-11 ENCOUNTER — Telehealth: Payer: Self-pay | Admitting: Oncology

## 2016-07-11 ENCOUNTER — Ambulatory Visit
Admission: RE | Admit: 2016-07-11 | Discharge: 2016-07-11 | Disposition: A | Discharge status: Home / Self Care | Payer: Medicaid Other | Source: Ambulatory Visit | Attending: Radiation Oncology | Admitting: Radiation Oncology

## 2016-07-11 ENCOUNTER — Other Ambulatory Visit (HOSPITAL_BASED_OUTPATIENT_CLINIC_OR_DEPARTMENT_OTHER): Payer: Medicaid Other

## 2016-07-11 ENCOUNTER — Ambulatory Visit (HOSPITAL_BASED_OUTPATIENT_CLINIC_OR_DEPARTMENT_OTHER): Payer: Medicaid Other | Admitting: Nurse Practitioner

## 2016-07-11 VITALS — BP 107/69 | HR 85 | Temp 98.2°F | Resp 18 | Ht 65.5 in | Wt 158.6 lb

## 2016-07-11 DIAGNOSIS — C7931 Secondary malignant neoplasm of brain: Secondary | ICD-10-CM

## 2016-07-11 DIAGNOSIS — C801 Malignant (primary) neoplasm, unspecified: Secondary | ICD-10-CM

## 2016-07-11 DIAGNOSIS — C712 Malignant neoplasm of temporal lobe: Secondary | ICD-10-CM | POA: Insufficient documentation

## 2016-07-11 DIAGNOSIS — R21 Rash and other nonspecific skin eruption: Secondary | ICD-10-CM | POA: Diagnosis not present

## 2016-07-11 DIAGNOSIS — D496 Neoplasm of unspecified behavior of brain: Secondary | ICD-10-CM

## 2016-07-11 DIAGNOSIS — Z510 Encounter for antineoplastic radiation therapy: Secondary | ICD-10-CM | POA: Diagnosis not present

## 2016-07-11 LAB — CHCC SMEAR

## 2016-07-11 LAB — CBC WITH DIFFERENTIAL/PLATELET
BASO%: 0.1 % (ref 0.0–2.0)
Basophils Absolute: 0 10*3/uL (ref 0.0–0.1)
EOS%: 0 % (ref 0.0–7.0)
Eosinophils Absolute: 0 10*3/uL (ref 0.0–0.5)
HEMATOCRIT: 35.1 % (ref 34.8–46.6)
HEMOGLOBIN: 11.5 g/dL — AB (ref 11.6–15.9)
LYMPH#: 0.5 10*3/uL — AB (ref 0.9–3.3)
LYMPH%: 6 % — ABNORMAL LOW (ref 14.0–49.7)
MCH: 28.4 pg (ref 25.1–34.0)
MCHC: 32.8 g/dL (ref 31.5–36.0)
MCV: 86.7 fL (ref 79.5–101.0)
MONO#: 0.3 10*3/uL (ref 0.1–0.9)
MONO%: 3.2 % (ref 0.0–14.0)
NEUT%: 90.7 % — ABNORMAL HIGH (ref 38.4–76.8)
NEUTROS ABS: 7.3 10*3/uL — AB (ref 1.5–6.5)
PLATELETS: 198 10*3/uL (ref 145–400)
RBC: 4.05 10*6/uL (ref 3.70–5.45)
RDW: 16.7 % — ABNORMAL HIGH (ref 11.2–14.5)
WBC: 8 10*3/uL (ref 3.9–10.3)

## 2016-07-11 LAB — COMPREHENSIVE METABOLIC PANEL
ALBUMIN: 3.8 g/dL (ref 3.5–5.0)
ALT: 20 U/L (ref 0–55)
AST: 12 U/L (ref 5–34)
Alkaline Phosphatase: 37 U/L — ABNORMAL LOW (ref 40–150)
Anion Gap: 9 mEq/L (ref 3–11)
BILIRUBIN TOTAL: 0.43 mg/dL (ref 0.20–1.20)
BUN: 28.8 mg/dL — ABNORMAL HIGH (ref 7.0–26.0)
CALCIUM: 10.3 mg/dL (ref 8.4–10.4)
CO2: 29 meq/L (ref 22–29)
CREATININE: 0.7 mg/dL (ref 0.6–1.1)
Chloride: 96 mEq/L — ABNORMAL LOW (ref 98–109)
EGFR: >90 mL/min/{1.73_m2} (ref >90)
Glucose: 220 mg/dl — ABNORMAL HIGH (ref 70–140)
Potassium: 4.7 mEq/L (ref 3.5–5.1)
Sodium: 135 mEq/L — ABNORMAL LOW (ref 136–145)
TOTAL PROTEIN: 6.7 g/dL (ref 6.4–8.3)

## 2016-07-11 LAB — FERRITIN: Ferritin: 312 ng/ml — ABNORMAL HIGH (ref 9–269)

## 2016-07-11 MED ORDER — VALACYCLOVIR HCL 1 G PO TABS: 1000.0000 mg | ORAL_TABLET | Freq: Three times a day (TID) | ORAL | 0 refills | Status: DC

## 2016-07-11 NOTE — Progress Notes (Addendum)
  [name]   Diagnosis:  Brain tumor  INTERVAL HISTORY:   Jaime [name] returns as scheduled. She began radiation and concurrent temozolomide 07/03/2016. She denies nausea/vomiting. No mouth sores. No diarrhea. She has noted a dry rash over the knuckles on both hands. She has also noticed a rash at the right medial buttock region. The previous labial lesion has resolved. She has periodic headaches. No visual disturbance. No falls.  Objective:  Vital signs in last 24 hours:  Blood pressure 107/69, pulse 85, temperature 98.2 F (36.8 C), temperature source Oral, resp. rate 18, height 5' 5.5" (1.664 m), weight 158 lb 9.6 oz (71.9 kg), last menstrual period 10/31/2005, SpO2 100 %.    HEENT: No thrush or ulcers. Resp: Lungs clear bilaterally. Cardio: Regular rate and rhythm. GI: Abdomen soft and nontender. No hepatomegaly. Vascular: No leg edema. Skin: Dry hyperpigmented rash over the MCP region both hands right greater than left. Viral appearing lesions right medial buttock.   Lab Results:  Lab Results  Component Value Date   WBC 8.0 07/11/2016   HGB 11.5 (L) 07/11/2016   HCT 35.1 07/11/2016   MCV 86.7 07/11/2016   PLT 198 07/11/2016   NEUTROABS 7.3 (H) 07/11/2016    Imaging:  No results found.  Medications: I have reviewed the patient's current medications.  Assessment/Plan: 1. History of iron deficiency anemia. Mild normocytic anemia during hospital admission May 2018 2. Sickle cell trait. 3. Status post hysterectomy. 4. History of dysphagia. She underwent an endoscopy by Dr. Collene Mares.  5. Diabetes 6. 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 Nausea/vomiting and headaches secondary to #6 8. Dry skin  rash both hands. 9. Viral appearing lesions right medial buttock. 7 day course of Valtrex prescribed.   Disposition: Jaime [name] appears stable. She continues radiation and temozolomide.  For the dry skin rash on her hands she will try a moisturizer.  She will complete a 7 day course of Valtrex for the viral appearing lesions at the right medial buttock.  We will contact radiation oncology regarding a taper schedule for dexamethasone.  She will return for labs and a follow-up visit on 07/24/2016.   Patient seen with Dr. Benay Spice.  Ned Card ANP/GNP-BC   07/11/2016  2:39 PM This was a shared visit with Ned Card. Jaime [name] was interviewed and examined. She appears to be tolerating the temozolomide and radiation well. She will continue the current treatment.  She appears to have a viral rash at the upper right buttock. She will complete treatment with Valtrex.  Julieanne Manson, M.D.

## 2016-07-11 NOTE — Progress Notes (Signed)
  Radiation Oncology         [phone]) [phone] ________________________________  Name: Jaime [name] MRN: [id number]  Date: 06/27/2016  DOB: May 09, 1965  SIMULATION AND TREATMENT PLANNING NOTE  DIAGNOSIS:     ICD-10-CM   1. 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  Identity was confirmed.  All relevant records and images related to the planned course of therapy were reviewed.   Written consent to proceed with treatment was confirmed which was freely given after reviewing the details related to the planned course of therapy had been reviewed with the patient.  Then, the patient was set-up in a stable reproducible  supine position for radiation therapy.  CT images were obtained.  Surface markings were placed.    Medically necessary complex treatment device(s) for immobilization:  Customized head cast.   The CT images were loaded into the planning software.  Then the target and avoidance structures were contoured.  Treatment planning then occurred.  The radiation prescription was entered and confirmed. I have requested : Intensity Modulated Radiotherapy (IMRT) is medically necessary for this case for the following reason:  Critical CNS structure avoidance - brainstem, optic chiasm, optic nerve.  The patient will undergo daily image guidance to ensure accurate localization of the target, and adequate minimize dose to the normal surrounding structures in close proximity to the target.   PLAN:  The patient will receive 40.05 Gy in 15 fractions.  Special treatment procedure The patient will also receive concurrent chemotherapy during the treatment. The patient may therefore experience increased toxicity or side effects and the patient will be monitored for such problems. This may require extra lab work as necessary. This therefore constitutes a special treatment  procedure.  ________________________________   Jodelle Gross, MD, PhD

## 2016-07-11 NOTE — Telephone Encounter (Signed)
Scheduled appt per 6/12 los - Gave patient AVS and calender per 6/12

## 2016-07-12 ENCOUNTER — Telehealth: Payer: Self-pay | Admitting: Radiation Oncology

## 2016-07-12 ENCOUNTER — Ambulatory Visit
Admission: RE | Admit: 2016-07-12 | Discharge: 2016-07-12 | Disposition: A | Discharge status: Home / Self Care | Payer: Medicaid Other | Source: Ambulatory Visit | Attending: Radiation Oncology | Admitting: Radiation Oncology

## 2016-07-12 DIAGNOSIS — Z510 Encounter for antineoplastic radiation therapy: Secondary | ICD-10-CM | POA: Diagnosis not present

## 2016-07-12 NOTE — Telephone Encounter (Signed)
I called the patient's mother and discussed that we would recommend Jaime [name] continue Dexamethasone 6 mg TID until Dr. Lisbeth Renshaw instructs the last week of her treatment, and we would change to 4mg  TID for at least a week, perhaps two prior to tapering any further given the extent of edema she presented with and given that she is not a surgical candidate to resect her tumor. They will call if she is running low on her medication.

## 2016-07-13 ENCOUNTER — Ambulatory Visit
Admission: RE | Admit: 2016-07-13 | Discharge: 2016-07-13 | Disposition: A | Discharge status: Home / Self Care | Payer: Medicaid Other | Source: Ambulatory Visit | Attending: Radiation Oncology | Admitting: Radiation Oncology

## 2016-07-13 DIAGNOSIS — Z510 Encounter for antineoplastic radiation therapy: Secondary | ICD-10-CM | POA: Diagnosis not present

## 2016-07-14 ENCOUNTER — Ambulatory Visit
Admission: RE | Admit: 2016-07-14 | Discharge: 2016-07-14 | Disposition: A | Discharge status: Home / Self Care | Payer: Medicaid Other | Source: Ambulatory Visit | Attending: Radiation Oncology | Admitting: Radiation Oncology

## 2016-07-14 DIAGNOSIS — Z510 Encounter for antineoplastic radiation therapy: Secondary | ICD-10-CM | POA: Diagnosis not present

## 2016-07-15 ENCOUNTER — Other Ambulatory Visit: Payer: Self-pay | Admitting: Internal Medicine

## 2016-07-17 ENCOUNTER — Ambulatory Visit
Admission: RE | Admit: 2016-07-17 | Discharge: 2016-07-17 | Disposition: A | Discharge status: Home / Self Care | Payer: Medicaid Other | Source: Ambulatory Visit | Attending: Radiation Oncology | Admitting: Radiation Oncology

## 2016-07-17 DIAGNOSIS — Z510 Encounter for antineoplastic radiation therapy: Secondary | ICD-10-CM | POA: Diagnosis not present

## 2016-07-18 ENCOUNTER — Ambulatory Visit
Admission: RE | Admit: 2016-07-18 | Discharge: 2016-07-18 | Disposition: A | Discharge status: Home / Self Care | Payer: Medicaid Other | Source: Ambulatory Visit | Attending: Radiation Oncology | Admitting: Radiation Oncology

## 2016-07-18 DIAGNOSIS — Z510 Encounter for antineoplastic radiation therapy: Secondary | ICD-10-CM | POA: Diagnosis not present

## 2016-07-18 NOTE — Progress Notes (Signed)
Patient came to nursing stating she has no taste on foods, what can she try?, encouraged to dink either ensure/boost/carnation instant breakfast ,can make milkshakes with that add p nut butter, n=bananas, honey to give extra calories and proteins, sh doesn't have any difficulty swallowing foods  Or with chewing foods,,she is still getting chemotherapy  Which could be causing the loss of taste 2:25 PM

## 2016-07-19 ENCOUNTER — Ambulatory Visit
Admission: RE | Admit: 2016-07-19 | Discharge: 2016-07-19 | Disposition: A | Discharge status: Home / Self Care | Payer: Medicaid Other | Source: Ambulatory Visit | Attending: Radiation Oncology | Admitting: Radiation Oncology

## 2016-07-19 DIAGNOSIS — Z510 Encounter for antineoplastic radiation therapy: Secondary | ICD-10-CM | POA: Diagnosis not present

## 2016-07-20 ENCOUNTER — Ambulatory Visit
Admission: RE | Admit: 2016-07-20 | Discharge: 2016-07-20 | Disposition: A | Discharge status: Home / Self Care | Payer: Medicaid Other | Source: Ambulatory Visit | Attending: Radiation Oncology | Admitting: Radiation Oncology

## 2016-07-20 DIAGNOSIS — Z510 Encounter for antineoplastic radiation therapy: Secondary | ICD-10-CM | POA: Diagnosis not present

## 2016-07-21 ENCOUNTER — Ambulatory Visit: Payer: Medicaid Other

## 2016-07-21 ENCOUNTER — Ambulatory Visit
Admission: RE | Admit: 2016-07-21 | Discharge: 2016-07-21 | Disposition: A | Discharge status: Home / Self Care | Payer: Medicaid Other | Source: Ambulatory Visit | Attending: Radiation Oncology | Admitting: Radiation Oncology

## 2016-07-21 ENCOUNTER — Encounter: Payer: Self-pay | Admitting: Radiation Oncology

## 2016-07-21 DIAGNOSIS — Z510 Encounter for antineoplastic radiation therapy: Secondary | ICD-10-CM | POA: Diagnosis not present

## 2016-07-21 NOTE — Progress Notes (Signed)
Instructions for steroid (Decadron/dexamethasone) taper: Beginning Wednesday 7/4, decrease decadron to 1 tablet twice a day for 7 days. Then take 1 tablet x1 per day for 7 days. Then take 1/2 tablet x1 per day for 7 days. Then take 1/2 tablet x1 every other day for 7 days. Then discontinue the medication.

## 2016-07-24 ENCOUNTER — Ambulatory Visit
Admission: RE | Admit: 2016-07-24 | Discharge: 2016-07-24 | Disposition: A | Discharge status: Home / Self Care | Payer: Medicaid Other | Source: Ambulatory Visit | Attending: Radiation Oncology | Admitting: Radiation Oncology

## 2016-07-24 ENCOUNTER — Telehealth: Payer: Self-pay | Admitting: Oncology

## 2016-07-24 ENCOUNTER — Other Ambulatory Visit (HOSPITAL_BASED_OUTPATIENT_CLINIC_OR_DEPARTMENT_OTHER): Payer: Medicaid Other

## 2016-07-24 ENCOUNTER — Encounter: Payer: Self-pay | Admitting: Radiation Oncology

## 2016-07-24 ENCOUNTER — Ambulatory Visit (HOSPITAL_BASED_OUTPATIENT_CLINIC_OR_DEPARTMENT_OTHER): Payer: Medicaid Other

## 2016-07-24 ENCOUNTER — Ambulatory Visit (HOSPITAL_BASED_OUTPATIENT_CLINIC_OR_DEPARTMENT_OTHER): Payer: Medicaid Other | Admitting: Oncology

## 2016-07-24 VITALS — BP 100/67 | HR 101 | Temp 98.6°F | Resp 20 | Ht 65.5 in | Wt 155.5 lb

## 2016-07-24 DIAGNOSIS — C7931 Secondary malignant neoplasm of brain: Secondary | ICD-10-CM

## 2016-07-24 DIAGNOSIS — Z510 Encounter for antineoplastic radiation therapy: Secondary | ICD-10-CM | POA: Diagnosis not present

## 2016-07-24 DIAGNOSIS — C712 Malignant neoplasm of temporal lobe: Secondary | ICD-10-CM

## 2016-07-24 DIAGNOSIS — D573 Sickle-cell trait: Secondary | ICD-10-CM

## 2016-07-24 DIAGNOSIS — R112 Nausea with vomiting, unspecified: Secondary | ICD-10-CM

## 2016-07-24 DIAGNOSIS — C801 Malignant (primary) neoplasm, unspecified: Secondary | ICD-10-CM | POA: Diagnosis not present

## 2016-07-24 LAB — COMPREHENSIVE METABOLIC PANEL
ALT: 31 U/L (ref 0–55)
AST: 14 U/L (ref 5–34)
Albumin: 3.9 g/dL (ref 3.5–5.0)
Alkaline Phosphatase: 37 U/L — ABNORMAL LOW (ref 40–150)
Anion Gap: 11 mEq/L (ref 3–11)
BILIRUBIN TOTAL: 0.42 mg/dL (ref 0.20–1.20)
BUN: 88.2 mg/dL — AB (ref 7.0–26.0)
CO2: 24 mEq/L (ref 22–29)
CREATININE: 2.2 mg/dL — AB (ref 0.6–1.1)
Calcium: 10.5 mg/dL — ABNORMAL HIGH (ref 8.4–10.4)
Chloride: 100 mEq/L (ref 98–109)
EGFR: 30 mL/min/{1.73_m2} — ABNORMAL LOW (ref >90)
GLUCOSE: 133 mg/dL (ref 70–140)
Potassium: 5 mEq/L (ref 3.5–5.1)
Sodium: 135 mEq/L — ABNORMAL LOW (ref 136–145)
Total Protein: 6.8 g/dL (ref 6.4–8.3)

## 2016-07-24 LAB — CBC WITH DIFFERENTIAL/PLATELET
BASO%: 0.4 % (ref 0.0–2.0)
BASOS ABS: 0 10*3/uL (ref 0.0–0.1)
EOS%: 0 % (ref 0.0–7.0)
Eosinophils Absolute: 0 10*3/uL (ref 0.0–0.5)
HEMATOCRIT: 34.6 % — AB (ref 34.8–46.6)
HGB: 11.7 g/dL (ref 11.6–15.9)
LYMPH%: 2.8 % — AB (ref 14.0–49.7)
MCH: 29.4 pg (ref 25.1–34.0)
MCHC: 33.9 g/dL (ref 31.5–36.0)
MCV: 86.9 fL (ref 79.5–101.0)
MONO#: 0.2 10*3/uL (ref 0.1–0.9)
MONO%: 2.5 % (ref 0.0–14.0)
NEUT#: 7.4 10*3/uL — ABNORMAL HIGH (ref 1.5–6.5)
NEUT%: 94.3 % — ABNORMAL HIGH (ref 38.4–76.8)
PLATELETS: 295 10*3/uL (ref 145–400)
RBC: 3.99 10*6/uL (ref 3.70–5.45)
RDW: 18.2 % — ABNORMAL HIGH (ref 11.2–14.5)
WBC: 7.8 10*3/uL (ref 3.9–10.3)
lymph#: 0.2 10*3/uL — ABNORMAL LOW (ref 0.9–3.3)

## 2016-07-24 MED ORDER — SODIUM CHLORIDE 0.9 % IV SOLN
1000.0000 mL | INTRAVENOUS | Status: AC
  Administered 2016-07-24: 16:00:00 via INTRAVENOUS

## 2016-07-24 MED ORDER — DEXAMETHASONE 4 MG PO TABS: 4.0000 mg | ORAL_TABLET | Freq: Two times a day (BID) | ORAL | 0 refills | Status: DC

## 2016-07-24 MED ORDER — ONDANSETRON 4 MG PO TBDP: 4.0000 mg | ORAL_TABLET | Freq: Three times a day (TID) | ORAL | 0 refills | Status: DC | PRN

## 2016-07-24 NOTE — Telephone Encounter (Signed)
Scheduled appt per 6/25 los - f/u on 7/11 with Lattie Haw. Gave patient AVS and calender.

## 2016-07-24 NOTE — Progress Notes (Signed)
  [facility] OFFICE PROGRESS NOTE   Diagnosis: Brain tumor  INTERVAL HISTORY:   Jaime [name] returns today with multiple family members. She is scheduled to complete radiation and temozolomide today. No seizure, headaches, or new neurologic symptoms. She reports intermittent nausea and a lack of taste for food. She has experienced several episodes of emesis.  Objective:  Vital signs in last 24 hours:  Blood pressure 100/67, pulse (!) 101, temperature 98.6 F (37 C), temperature source Oral, resp. rate 20, height 5' 5.5" (1.664 m), weight 155 lb 8 oz (70.5 kg), last menstrual period 10/31/2005, SpO2 99 %.    HEENT: No thrush or ulcers Resp: Lungs clear bilaterally Cardio: Regular rate and rhythm GI: No hepatosplenomegaly, soft and nontender Vascular: No leg edema Neuro: Alert and oriented, the motor exam appears intact in the upper and lower extremities  Skin: Healed rash at the upper right gluteal area  Lab Results:  Lab Results  Component Value Date   WBC 7.8 07/24/2016   HGB 11.7 07/24/2016   HCT 34.6 (L) 07/24/2016   MCV 86.9 07/24/2016   PLT 295 07/24/2016   NEUTROABS 7.4 (H) 07/24/2016    CMP     Component Value Date/Time   NA 135 (L) 07/24/2016 1131   K 5.0 07/24/2016 1131   CL 105 06/20/2016 0438   CO2 24 07/24/2016 1131   GLUCOSE 133 07/24/2016 1131   BUN 88.2 (H) 07/24/2016 1131   CREATININE 2.2 (H) 07/24/2016 1131   CALCIUM 10.5 (H) 07/24/2016 1131   PROT 6.8 07/24/2016 1131   ALBUMIN 3.9 07/24/2016 1131   AST 14 07/24/2016 1131   ALT 31 07/24/2016 1131   ALKPHOS 37 (L) 07/24/2016 1131   BILITOT 0.42 07/24/2016 1131   GFRNONAA >60 06/20/2016 0438   GFRNONAA >89 07/07/2013 1529   GFRAA >60 06/20/2016 0438   GFRAA >89 07/07/2013 1529   Medications: I have reviewed the patient's current medications.  Assessment/Plan: 1. History of iron deficiency anemia. Mild normocytic anemia during hospital admission May 2018 2. Sickle cell  trait. 3. Status post hysterectomy. 4. History of dysphagia. She underwent an endoscopy by Dr. Collene Mares.  5. Diabetes 6. 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 History of Nausea/vomiting and headaches secondary to #6 8. Dry skin rash both hands. 9. Viral appearing lesions right medial buttock. Status post 7 day course of Valtrex-healed 10. Markedly elevated BUN/creatinine 07/24/2016-dehydration?    Disposition:  Jaime [name] is scheduled to complete temozolomide and radiation today. She continues high-dose Decadron, but has persistent nausea with intermittent vomiting. The chemistry panel today is suggestive of dehydration. The nausea may be related to radiation versus persistent/progression of the brain tumor.  We will taper the Decadron and administer IV fluids today. She will return for an office visit 07/27/2016.  25 minutes were spent with the patient today. The majority of the time was used for counseling and coordination of care.  Donneta Romberg, MD  07/24/2016  2:37 PM

## 2016-07-24 NOTE — Progress Notes (Signed)
CMET reviewed by Dr. Benay Spice: Elevated calcium and creatinine noted. Pt will be worked in for IV hydration. Instructed her and family members to check in for infusion after radiation.

## 2016-07-24 NOTE — Patient Instructions (Signed)
Dehydration, Adult Dehydration is a condition in which there is not enough fluid or water in the body. This happens when you lose more fluids than you take in. Important organs, such as the kidneys, brain, and heart, cannot function without a proper amount of fluids. Any loss of fluids from the body can lead to dehydration. Dehydration can range from mild to severe. This condition should be treated right away to prevent it from becoming severe. What are the causes? This condition may be caused by:  Vomiting.  Diarrhea.  Excessive sweating, such as from heat exposure or exercise.  Not drinking enough fluid, especially: ? When ill. ? While doing activity that requires a lot of energy.  Excessive urination.  Fever.  Infection.  Certain medicines, such as medicines that cause the body to lose excess fluid (diuretics).  Inability to access safe drinking water.  Reduced physical ability to get adequate water and food.  What increases the risk? This condition is more likely to develop in people:  Who have a poorly controlled long-term (chronic) illness, such as diabetes, heart disease, or kidney disease.  Who are age 65 or older.  Who are disabled.  Who live in a place with high altitude.  Who play endurance sports.  What are the signs or symptoms? Symptoms of mild dehydration may include:  Thirst.  Dry lips.  Slightly dry mouth.  Dry, warm skin.  Dizziness. Symptoms of moderate dehydration may include:  Very dry mouth.  Muscle cramps.  Dark urine. Urine may be the color of tea.  Decreased urine production.  Decreased tear production.  Heartbeat that is irregular or faster than normal (palpitations).  Headache.  Light-headedness, especially when you stand up from a sitting position.  Fainting (syncope). Symptoms of severe dehydration may include:  Changes in skin, such as: ? Cold and clammy skin. ? Blotchy (mottled) or pale skin. ? Skin that does  not quickly return to normal after being lightly pinched and released (poor skin turgor).  Changes in body fluids, such as: ? Extreme thirst. ? No tear production. ? Inability to sweat when body temperature is high, such as in hot weather. ? Very little urine production.  Changes in vital signs, such as: ? Weak pulse. ? Pulse that is more than 100 beats a minute when sitting still. ? Rapid breathing. ? Low blood pressure.  Other changes, such as: ? Sunken eyes. ? Cold hands and feet. ? Confusion. ? Lack of energy (lethargy). ? Difficulty waking up from sleep. ? Short-term weight loss. ? Unconsciousness. How is this diagnosed? This condition is diagnosed based on your symptoms and a physical exam. Blood and urine tests may be done to help confirm the diagnosis. How is this treated? Treatment for this condition depends on the severity. Mild or moderate dehydration can often be treated at home. Treatment should be started right away. Do not wait until dehydration becomes severe. Severe dehydration is an emergency and it needs to be treated in a hospital. Treatment for mild dehydration may include:  Drinking more fluids.  Replacing salts and minerals in your blood (electrolytes) that you may have lost. Treatment for moderate dehydration may include:  Drinking an oral rehydration solution (ORS). This is a drink that helps you replace fluids and electrolytes (rehydrate). It can be found at pharmacies and retail stores. Treatment for severe dehydration may include:  Receiving fluids through an IV tube.  Receiving an electrolyte solution through a feeding tube that is passed through your nose   and into your stomach (nasogastric tube, or NG tube).  Correcting any abnormalities in electrolytes.  Treating the underlying cause of dehydration. Follow these instructions at home:  If directed by your health care provider, drink an ORS: ? Make an ORS by following instructions on the  package. ? Start by drinking small amounts, about  cup (120 mL) every 5-10 minutes. ? Slowly increase how much you drink until you have taken the amount recommended by your health care provider.  Drink enough clear fluid to keep your urine clear or pale yellow. If you were told to drink an ORS, finish the ORS first, then start slowly drinking other clear fluids. Drink fluids such as: ? Water. Do not drink only water. Doing that can lead to having too little salt (sodium) in the body (hyponatremia). ? Ice chips. ? Fruit juice that you have added water to (diluted fruit juice). ? Low-calorie sports drinks.  Avoid: ? Alcohol. ? Drinks that contain a lot of sugar. These include high-calorie sports drinks, fruit juice that is not diluted, and soda. ? Caffeine. ? Foods that are greasy or contain a lot of fat or sugar.  Take over-the-counter and prescription medicines only as told by your health care provider.  Do not take sodium tablets. This can lead to having too much sodium in the body (hypernatremia).  Eat foods that contain a healthy balance of electrolytes, such as bananas, oranges, potatoes, tomatoes, and spinach.  Keep all follow-up visits as told by your health care provider. This is important. Contact a health care provider if:  You have abdominal pain that: ? Gets worse. ? Stays in one area (localizes).  You have a rash.  You have a stiff neck.  You are more irritable than usual.  You are sleepier or more difficult to wake up than usual.  You feel weak or dizzy.  You feel very thirsty.  You have urinated only a small amount of very dark urine over 6-8 hours. Get help right away if:  You have symptoms of severe dehydration.  You cannot drink fluids without vomiting.  Your symptoms get worse with treatment.  You have a fever.  You have a severe headache.  You have vomiting or diarrhea that: ? Gets worse. ? Does not go away.  You have blood or green matter  (bile) in your vomit.  You have blood in your stool. This may cause stool to look black and tarry.  You have not urinated in 6-8 hours.  You faint.  Your heart rate while sitting still is over 100 beats a minute.  You have trouble breathing. This information is not intended to replace advice given to you by your health care provider. Make sure you discuss any questions you have with your health care provider. Document Released: 01/16/2005 Document Revised: 08/13/2015 Document Reviewed: 03/12/2015 Elsevier Interactive Patient Education  2018 [employer]  

## 2016-07-25 ENCOUNTER — Telehealth: Payer: Self-pay

## 2016-07-25 NOTE — Telephone Encounter (Signed)
Pt mother called d/t constipation. LBM was 2 days ago. Pt cries and says it hurts when she strains.  She used fleets enema and moved just a little, some small balls. Then she had some blood on the tissue. Instructed mother to use miralax and colace and wait about 8 hours. If no results drink 1/2 bottle mag citrate. If no results in 4-6 hours drink other 1/2 bottle of mag citrate. Instructed her to stay away from suppositories and enemas d/t current slight bleeding.  Pt to be seen 6/28 lab, lisa, and IVF per OV note 6/25. instructed mother to call tomorrow if she has not heard from schedulers yet.

## 2016-07-25 NOTE — Progress Notes (Signed)
°  Radiation Oncology         [phone] ________________________________  Name: MONNA CREAN MRN: [id number]  Date: 07/24/2016  DOB: 09/16/1965  End of Treatment Note  Diagnosis:   Putative Glioblastoma Multiforme.       ICD-10-CM   1. Glioblastoma multiforme of temporal lobe ([place]) C71.2     Indication for treatment:  Palliative       Radiation treatment dates:   07/03/2016 to 07/24/2016  Site/dose:   The braintumor was treated to 40.05 Gy in 15 fractions at 2.67 Gy per fraction.   Beams/energy:   IMRT // 6X  Narrative: The patient tolerated radiation treatment relatively well.   The patient experienced nausea and vomiting during the course of treatment, managed with Zofran. She also reported continued headaches. She developed alopecia to the left posterior head.   Plan: The patient has completed radiation treatment. The patient will return to radiation oncology clinic for routine followup in one month. I advised them to call or return sooner if they have any questions or concerns related to their recovery or treatment.  ------------------------------------------------  Jodelle Gross, MD, PhD  This document serves as a record of services personally performed by Kyung Rudd, MD. It was created on his behalf by Arlyce Harman, a trained medical scribe. The creation of this record is based on the scribe's personal observations and the provider's statements to them. This document has been checked and approved by the attending provider.

## 2016-07-26 ENCOUNTER — Telehealth: Payer: Self-pay | Admitting: Oncology

## 2016-07-26 NOTE — Telephone Encounter (Signed)
lvm to inform pt of 6/28 appts per sch msg

## 2016-07-27 ENCOUNTER — Ambulatory Visit: Payer: Medicaid Other | Admitting: Oncology

## 2016-07-27 ENCOUNTER — Telehealth: Payer: Self-pay | Admitting: *Deleted

## 2016-07-27 ENCOUNTER — Other Ambulatory Visit: Payer: Self-pay | Admitting: *Deleted

## 2016-07-27 ENCOUNTER — Other Ambulatory Visit (HOSPITAL_BASED_OUTPATIENT_CLINIC_OR_DEPARTMENT_OTHER): Payer: Medicaid Other

## 2016-07-27 ENCOUNTER — Ambulatory Visit (HOSPITAL_BASED_OUTPATIENT_CLINIC_OR_DEPARTMENT_OTHER): Payer: Medicaid Other

## 2016-07-27 ENCOUNTER — Other Ambulatory Visit: Payer: Self-pay | Admitting: Oncology

## 2016-07-27 DIAGNOSIS — C7931 Secondary malignant neoplasm of brain: Secondary | ICD-10-CM

## 2016-07-27 DIAGNOSIS — C712 Malignant neoplasm of temporal lobe: Secondary | ICD-10-CM

## 2016-07-27 DIAGNOSIS — D496 Neoplasm of unspecified behavior of brain: Secondary | ICD-10-CM

## 2016-07-27 DIAGNOSIS — R112 Nausea with vomiting, unspecified: Secondary | ICD-10-CM | POA: Diagnosis not present

## 2016-07-27 LAB — CBC WITH DIFFERENTIAL/PLATELET
BASO%: 0 % (ref 0.0–2.0)
BASOS ABS: 0 10*3/uL (ref 0.0–0.1)
EOS%: 0 % (ref 0.0–7.0)
Eosinophils Absolute: 0 10*3/uL (ref 0.0–0.5)
HCT: 35.7 % (ref 34.8–46.6)
HEMOGLOBIN: 12.1 g/dL (ref 11.6–15.9)
LYMPH#: 0.4 10*3/uL — AB (ref 0.9–3.3)
LYMPH%: 6.3 % — ABNORMAL LOW (ref 14.0–49.7)
MCH: 29.2 pg (ref 25.1–34.0)
MCHC: 33.9 g/dL (ref 31.5–36.0)
MCV: 86 fL (ref 79.5–101.0)
MONO#: 0 10*3/uL — ABNORMAL LOW (ref 0.1–0.9)
MONO%: 0.1 % (ref 0.0–14.0)
NEUT#: 6.5 10*3/uL (ref 1.5–6.5)
NEUT%: 93.6 % — ABNORMAL HIGH (ref 38.4–76.8)
Platelets: 205 10*3/uL (ref 145–400)
RBC: 4.15 10*6/uL (ref 3.70–5.45)
RDW: 17.5 % — AB (ref 11.2–14.5)
WBC: 7 10*3/uL (ref 3.9–10.3)

## 2016-07-27 LAB — BASIC METABOLIC PANEL
Anion Gap: 10 mEq/L (ref 3–11)
BUN: 55.3 mg/dL — ABNORMAL HIGH (ref 7.0–26.0)
CALCIUM: 10.2 mg/dL (ref 8.4–10.4)
CHLORIDE: 102 meq/L (ref 98–109)
CO2: 25 meq/L (ref 22–29)
Creatinine: 1 mg/dL (ref 0.6–1.1)
EGFR: 73 mL/min/{1.73_m2} — AB (ref >90)
Glucose: 60 mg/dl — ABNORMAL LOW (ref 70–140)
Potassium: 4.6 mEq/L (ref 3.5–5.1)
SODIUM: 138 meq/L (ref 136–145)

## 2016-07-27 MED ORDER — SODIUM CHLORIDE 0.9 % IV SOLN
INTRAVENOUS | Status: AC
  Administered 2016-07-27: 14:00:00 via INTRAVENOUS

## 2016-07-27 MED ORDER — SODIUM CHLORIDE 0.9 % IV SOLN
INTRAVENOUS | Status: AC
  Administered 2016-07-27: 15:00:00 via INTRAVENOUS

## 2016-07-27 NOTE — Patient Instructions (Signed)
Dehydration, Adult Dehydration is when there is not enough fluid or water in your body. This happens when you lose more fluids than you take in. Dehydration can range from mild to very bad. It should be treated right away to keep it from getting very bad. Symptoms of mild dehydration may include:  Thirst.  Dry lips.  Slightly dry mouth.  Dry, warm skin.  Dizziness. Symptoms of moderate dehydration may include:  Very dry mouth.  Muscle cramps.  Dark pee (urine). Pee may be the color of tea.  Your body making less pee.  Your eyes making fewer tears.  Heartbeat that is uneven or faster than normal (palpitations).  Headache.  Light-headedness, especially when you stand up from sitting.  Fainting (syncope). Symptoms of very bad dehydration may include:  Changes in skin, such as: ? Cold and clammy skin. ? Blotchy (mottled) or pale skin. ? Skin that does not quickly return to normal after being lightly pinched and let go (poor skin turgor).  Changes in body fluids, such as: ? Feeling very thirsty. ? Your eyes making fewer tears. ? Not sweating when body temperature is high, such as in hot weather. ? Your body making very little pee.  Changes in vital signs, such as: ? Weak pulse. ? Pulse that is more than 100 beats a minute when you are sitting still. ? Fast breathing. ? Low blood pressure.  Other changes, such as: ? Sunken eyes. ? Cold hands and feet. ? Confusion. ? Lack of energy (lethargy). ? Trouble waking up from sleep. ? Short-term weight loss. ? Unconsciousness. Follow these instructions at home:  If told by your doctor, drink an ORS: ? Make an ORS by using instructions on the package. ? Start by drinking small amounts, about  cup (120 mL) every 5-10 minutes. ? Slowly drink more until you have had the amount that your doctor said to have.  Drink enough clear fluid to keep your pee clear or pale yellow. If you were told to drink an ORS, finish the ORS  first, then start slowly drinking clear fluids. Drink fluids such as: ? Water. Do not drink only water by itself. Doing that can make the salt (sodium) level in your body get too low (hyponatremia). ? Ice chips. ? Fruit juice that you have added water to (diluted). ? Low-calorie sports drinks.  Avoid: ? Alcohol. ? Drinks that have a lot of sugar. These include high-calorie sports drinks, fruit juice that does not have water added, and soda. ? Caffeine. ? Foods that are greasy or have a lot of fat or sugar.  Take over-the-counter and prescription medicines only as told by your doctor.  Do not take salt tablets. Doing that can make the salt level in your body get too high (hypernatremia).  Eat foods that have minerals (electrolytes). Examples include bananas, oranges, potatoes, tomatoes, and spinach.  Keep all follow-up visits as told by your doctor. This is important. Contact a doctor if:  You have belly (abdominal) pain that: ? Gets worse. ? Stays in one area (localizes).  You have a rash.  You have a stiff neck.  You get angry or annoyed more easily than normal (irritability).  You are more sleepy than normal.  You have a harder time waking up than normal.  You feel: ? Weak. ? Dizzy. ? Very thirsty.  You have peed (urinated) only a small amount of very dark pee during 6-8 hours. Get help right away if:  You have symptoms of   very bad dehydration.  You cannot drink fluids without throwing up (vomiting).  Your symptoms get worse with treatment.  You have a fever.  You have a very bad headache.  You are throwing up or having watery poop (diarrhea) and it: ? Gets worse. ? Does not go away.  You have blood or something green (bile) in your throw-up.  You have blood in your poop (stool). This may cause poop to look black and tarry.  You have not peed in 6-8 hours.  You pass out (faint).  Your heart rate when you are sitting still is more than 100 beats a  minute.  You have trouble breathing. This information is not intended to replace advice given to you by your health care provider. Make sure you discuss any questions you have with your health care provider. Document Released: 11/12/2008 Document Revised: 08/06/2015 Document Reviewed: 03/12/2015 Elsevier Interactive Patient Education  2018 [employer]  

## 2016-07-27 NOTE — Telephone Encounter (Signed)
Called pt re: missed appt today. Mother stated she was unaware of appointments. She can be here in 30 minutes. Instructed her to check in for lab. Will make MD aware.

## 2016-08-01 ENCOUNTER — Telehealth: Payer: Self-pay

## 2016-08-01 NOTE — Telephone Encounter (Signed)
Spoke to pt's mother to follow-up on triage call they made on 07/30/16. Pt's mother stated that the pt was having issues with low BP and that they called EMS to the house as directed by triage. She reported that the pt's BP stabilized and she did not need to be transported. Pt's mother states that she has not hand any issues with dizziness of hypotension since.

## 2016-08-04 ENCOUNTER — Emergency Department (HOSPITAL_COMMUNITY)
Admission: EM | Admit: 2016-08-04 | Discharge: 2016-08-04 | Disposition: A | Discharge status: Home / Self Care | Payer: Medicaid Other | Attending: Emergency Medicine | Admitting: Emergency Medicine

## 2016-08-04 DIAGNOSIS — D649 Anemia, unspecified: Secondary | ICD-10-CM | POA: Diagnosis not present

## 2016-08-04 DIAGNOSIS — Z85841 Personal history of malignant neoplasm of brain: Secondary | ICD-10-CM | POA: Diagnosis not present

## 2016-08-04 DIAGNOSIS — E11319 Type 2 diabetes mellitus with unspecified diabetic retinopathy without macular edema: Secondary | ICD-10-CM | POA: Insufficient documentation

## 2016-08-04 DIAGNOSIS — I10 Essential (primary) hypertension: Secondary | ICD-10-CM | POA: Insufficient documentation

## 2016-08-04 DIAGNOSIS — R112 Nausea with vomiting, unspecified: Secondary | ICD-10-CM

## 2016-08-04 DIAGNOSIS — Z794 Long term (current) use of insulin: Secondary | ICD-10-CM | POA: Insufficient documentation

## 2016-08-04 DIAGNOSIS — Z7984 Long term (current) use of oral hypoglycemic drugs: Secondary | ICD-10-CM | POA: Diagnosis not present

## 2016-08-04 DIAGNOSIS — Z7982 Long term (current) use of aspirin: Secondary | ICD-10-CM | POA: Diagnosis not present

## 2016-08-04 DIAGNOSIS — Z79899 Other long term (current) drug therapy: Secondary | ICD-10-CM | POA: Diagnosis not present

## 2016-08-04 LAB — I-STAT CHEM 8, ED
BUN: 42 mg/dL — AB (ref 6–20)
CREATININE: 0.8 mg/dL (ref 0.44–1.00)
Calcium, Ion: 1.24 mmol/L (ref 1.15–1.40)
Chloride: 105 mmol/L (ref 101–111)
GLUCOSE: 56 mg/dL — AB (ref 65–99)
HEMATOCRIT: 28 % — AB (ref 36.0–46.0)
Hemoglobin: 9.5 g/dL — ABNORMAL LOW (ref 12.0–15.0)
POTASSIUM: 4 mmol/L (ref 3.5–5.1)
Sodium: 138 mmol/L (ref 135–145)
TCO2: 23 mmol/L (ref 0–100)

## 2016-08-04 MED ORDER — ONDANSETRON HCL 4 MG/2ML IJ SOLN
4.0000 mg | Freq: Once | INTRAMUSCULAR | Status: AC
  Administered 2016-08-04: 4 mg via INTRAVENOUS
  Filled 2016-08-04: qty 2

## 2016-08-04 MED ORDER — SODIUM CHLORIDE 0.9 % IV BOLUS (SEPSIS)
1000.0000 mL | Freq: Once | INTRAVENOUS | Status: AC
  Administered 2016-08-04: 1000 mL via INTRAVENOUS

## 2016-08-04 NOTE — ED Notes (Signed)
Bed: WA21 Expected date:  Expected time:  Means of arrival:  Comments: EMS 51 yo female brain cancer/nausea and vomiting x 1 hour BP 80/40-states that is her normal

## 2016-08-04 NOTE — ED Provider Notes (Signed)
[facility] DEPT Provider Note   CSN: [id number] Arrival date & time: 08/04/16  0053    History   Chief Complaint Chief Complaint  Patient presents with  . Emesis    HPI Jaime [name] is a 51 y.o. female.  51 year old female with history of diabetes mellitus, esophageal reflux, and recently diagnosed GBM presents to the emergency department for nausea and vomiting. Symptoms began one hour ago while at home. Mother was concerned as emesis was very large in quantity which is atypical for the patient. She has had an issue with intermittent emesis since diagnosis with her GBM. She denies current headache, though mother reports headache earlier yesterday morning. The patient notes more frequent headaches since initiation of her Decadron taper. Patient did receive anti-emetics prior to arrival. She has not had any vomiting since receiving this medications. No fevers at home. No unilateral extremity numbness, paresthesias, or weakness.   The history is provided by the patient. No language interpreter was used.  Emesis      Past Medical History:  Diagnosis Date  . Candida, oral 06/22/2016   Due to steroids 06/22/16  . Diabetes mellitus   . DUB (dysfunctional uterine bleeding)    total abdominal hysterectomy in 2007, ovaries intact  . Furuncle of multiple sites    multiple I and D's  . GERD (gastroesophageal reflux disease)   . Hydradenitis   . Intellectual disability   . 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 Furuncle of labia majora 06/29/2016  . 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 Palliative care by specialist   . HLD (hyperlipidemia) 06/18/2016  . GERD (gastroesophageal reflux disease) 06/18/2016  . Brain tumor ([place])   . Nonproliferative diabetic retinopathy ([place]) 12/17/2015  . Onychomycosis of left great toe 01/13/2014  .  DNR (do not resuscitate) discussion 09/23/2013  . Hypertension 06/11/2013  . Diabetes mellitus type 2 with retinopathy ([place]) 11/30/2009  . Obesity 09/28/2009  . SICKLE-CELL TRAIT 09/28/2009    Past Surgical History:  Procedure Laterality Date  . ABDOMINAL HYSTERECTOMY    . HYDRADENITIS EXCISION    . TONSILLECTOMY AND ADENOIDECTOMY     in early childhood    OB History    No data available       Home Medications    Prior to Admission medications   Medication Sig Start Date End Date Taking? Authorizing Provider  ACCU-CHEK AVIVA PLUS test strip USE TO TEST BLOOD SUGAR FOUR TIMES DAILY 07/11/16   Minus Liberty, MD  ACCU-CHEK SOFTCLIX LANCETS lancets USE TO CHECK BLOOD SUGAR FOUR TIMES DAILY 07/11/16   Minus Liberty, MD  acetaminophen (TYLENOL) 500 MG tablet Take 500 mg by mouth every 8 (eight) hours as needed.    [provider]  aspirin EC 81 MG tablet Take 81 mg by mouth daily.    [provider]  blood glucose meter kit and supplies Dispense based on patient and insurance preference. Use up to four times daily as directed. (FOR ICD-9 250.00, 250.01). 06/21/16   Holley Raring, MD  dexamethasone (DECADRON) 4 MG tablet Take 1 tablet (4 mg total) by mouth 2 (two) times daily with a meal. 07/24/16   Ladell Pier, MD  emollient (BIAFINE) cream Apply 1 application topically daily. 07/03/16   [provider]  famotidine (PEPCID) 20 MG tablet Take 1 tablet (20 mg total) by mouth 2 (two) times  daily. 07/03/16   Alm Bustard, MD  fluticasone Aleda Grana) 50 MCG/ACT nasal spray Place 2 sprays into both nostrils daily. 06/08/16   Dorena Bodo, NP  insulin aspart (NOVOLOG FLEXPEN) 100 UNIT/ML FlexPen Inject 5 Units into the skin 3 (three) times daily with meals. 06/21/16   Carolynn Comment, MD  insulin glargine (LANTUS) 100 unit/mL SOPN Inject 0.17 mLs (17 Units total) into the skin at bedtime. 06/28/16   Nyra Market, MD  Insulin Pen Needle 31G X 5 MM  MISC Use for injections under the skin as directed. 06/21/16   Carolynn Comment, MD  levETIRAcetam (KEPPRA) 500 MG tablet TAKE 1 TABLET BY MOUTH TWICE DAILY 07/17/16   Alm Bustard, MD  lisinopril (PRINIVIL,ZESTRIL) 40 MG tablet Take 1 tablet (40 mg total) by mouth daily. 07/07/16 08/06/16  Alm Bustard, MD  loratadine (CLARITIN) 10 MG tablet Take 1 tablet (10 mg total) by mouth daily. 07/03/16   Alm Bustard, MD  LORazepam (ATIVAN) 0.5 MG tablet 1 tablet po 30 minutes prior to radiation or MRI 06/21/16   Ronny Bacon, PA-C  metFORMIN (GLUCOPHAGE) 1000 MG tablet Take 1 tablet (1,000 mg total) by mouth 2 (two) times daily with a meal. 06/21/16 09/19/16  Carolynn Comment, MD  montelukast (SINGULAIR) 10 MG tablet Take 1 tablet (10 mg total) by mouth at bedtime. 06/08/16   Dorena Bodo, NP  ondansetron (ZOFRAN ODT) 4 MG disintegrating tablet Take 1 tablet (4 mg total) by mouth every 8 (eight) hours as needed for nausea or vomiting. 07/24/16   Ladene Artist, MD  temozolomide (TEMODAR) 140 MG capsule Take 1 capsule (140 mg total) by mouth daily. May take on an empty stomach or at bedtime to decrease nausea & vomiting. 06/27/16   Ladene Artist, MD    Family History Family History  Problem Relation Age of Onset  . Cancer Mother 35       Breast cancer  . Hypertension Mother   . Depression Mother   . Sickle cell trait Mother   . Cancer Father 52       lung cancer, was a long term smoker    Social History Social History  Substance Use Topics  . Smoking status: Never Smoker  . Smokeless tobacco: Never Used  . Alcohol use No     Allergies   Patient has no known allergies.   Review of Systems Review of Systems  Gastrointestinal: Positive for vomiting.  Ten systems reviewed and are negative for acute change, except as noted in the HPI.    Physical Exam Updated Vital Signs BP (!) 94/59 (BP Location: Right Arm)   Pulse 86   Temp 98.2 F (36.8 C) (Oral)   Resp 18    LMP 10/31/2005   SpO2 97%   Physical Exam  Constitutional: She is oriented to person, place, and time. She appears well-developed and well-nourished. No distress.  Nontoxic and in NAD  HENT:  Head: Normocephalic and atraumatic.  Mouth/Throat: Oropharynx is clear and moist.  Eyes: Conjunctivae and EOM are normal. Pupils are equal, round, and reactive to light. No scleral icterus.  Neck: Normal range of motion.  Cardiovascular: Normal rate, regular rhythm and intact distal pulses.   Pulmonary/Chest: Effort normal. No respiratory distress. She has no wheezes.  Respirations even and unlabored  Musculoskeletal: Normal range of motion.  Neurological: She is alert and oriented to person, place, and time. No cranial nerve deficit. She exhibits normal muscle tone. Coordination normal.  GCS 15. Speech is goal oriented.  No focal deficits noted.  Skin: Skin is warm and dry. No rash noted. She is not diaphoretic. No erythema. No pallor.  Psychiatric: She has a normal mood and affect. Her behavior is normal.  Nursing note and vitals reviewed.    ED Treatments / Results  Labs (all labs ordered are listed, but only abnormal results are displayed) Labs Reviewed  I-STAT CHEM 8, ED - Abnormal; Notable for the following:       Result Value   BUN 42 (*)    Glucose, Bld 56 (*)    Hemoglobin 9.5 (*)    HCT 28.0 (*)    All other components within normal limits    EKG  EKG Interpretation None       Radiology No results found.  Procedures Procedures (including critical care time)  Medications Ordered in ED Medications  sodium chloride 0.9 % bolus 1,000 mL (1,000 mLs Intravenous New Bag/Given 08/04/16 0355)  ondansetron (ZOFRAN) injection 4 mg (4 mg Intravenous Given 08/04/16 0356)     Initial Impression / Assessment and Plan / ED Course  I have reviewed the triage vital signs and the nursing notes.  Pertinent labs & imaging results that were available during my care of the patient  were reviewed by me and considered in my medical decision making (see chart for details).     51 year old female with a history of recently diagnosed GBM presents to the emergency department for an episode of vomiting x 1. Patient has had issues with vomiting in the past since her diagnosis. Prior notes from her oncologist reference this possibly being a side effect of her palliative medications and radiation or her progressing tumor. Patient currently has no complaints of headache. No gross focal deficits on exam. She has had no vomiting since arrival and denies nausea since taking an oral antiemetic at home. Chemistry panel is reassuring, though anemia is noted which is slightly worse from baseline.  Patient has been hydrated with 1 L of IV fluids. She has been given IV Zofran. On reassessment, she has no complaints of nausea or headache. She states that she is feeling better. Family expresses comfort with discharge. I have discussed the patient's hemoglobin and need for repeat testing of this by her oncologist at her scheduled appointment in 5 days. Bleeding precautions discussed and provided. Patient discharged in stable condition. Family and patient with no unaddressed concerns.   Vitals:   08/04/16 0054 08/04/16 0101 08/04/16 0438  BP:  (!) 88/54 (!) 94/59  Pulse:  96 86  Resp:  16 18  Temp:  97.9 F (36.6 C) 98.2 F (36.8 C)  TempSrc:  Oral Oral  SpO2: 98% 97% 97%   *On review, hypotension noted to be c/w baseline from prior outpatient visits.   Final Clinical Impressions(s) / ED Diagnoses   Final diagnoses:  Non-intractable vomiting with nausea, unspecified vomiting type  Anemia, unspecified type    New Prescriptions New Prescriptions   No medications on file     Antonietta Breach, PA-C 08/04/16 0457    Veryl Speak, MD 08/04/16 [phone]

## 2016-08-04 NOTE — ED Notes (Signed)
Attempt x 2 to start IV unsuccessful-able to obtain blood work with 2nd stick

## 2016-08-04 NOTE — ED Triage Notes (Signed)
Pt c/o nausea and emesis since an ago ago. No longer longer vomiting. Hx of brain cancer.

## 2016-08-04 NOTE — Discharge Instructions (Signed)
We suspect your nausea and vomiting to be related to your known cancer. Continue taking your home nausea medications for persistent symptoms. Drink plenty of clear liquids to prevent dehydration. You were found to be slightly anemic today. You do not require a blood transfusion. This may also be related to your cancer. Have your hemoglobin rechecked by your oncologist. If you begin to feel lightheaded, experienced fainting spells, develop shortness of breath, bloody stools, blood in your vomit, or fever we recommend that you return to the emergency department for further evaluation. You may return, also, for new or concerning symptoms.

## 2016-08-09 ENCOUNTER — Telehealth: Payer: Self-pay | Admitting: Oncology

## 2016-08-09 ENCOUNTER — Ambulatory Visit: Payer: Medicaid Other | Admitting: Podiatry

## 2016-08-09 ENCOUNTER — Ambulatory Visit (HOSPITAL_BASED_OUTPATIENT_CLINIC_OR_DEPARTMENT_OTHER): Payer: Medicaid Other

## 2016-08-09 ENCOUNTER — Ambulatory Visit (HOSPITAL_BASED_OUTPATIENT_CLINIC_OR_DEPARTMENT_OTHER): Payer: Medicaid Other | Admitting: Nurse Practitioner

## 2016-08-09 VITALS — BP 94/68 | HR 100 | Temp 100.3°F | Resp 18 | Ht 65.5 in | Wt 145.4 lb

## 2016-08-09 DIAGNOSIS — C7931 Secondary malignant neoplasm of brain: Secondary | ICD-10-CM

## 2016-08-09 DIAGNOSIS — Z79899 Other long term (current) drug therapy: Secondary | ICD-10-CM

## 2016-08-09 DIAGNOSIS — R110 Nausea: Secondary | ICD-10-CM | POA: Diagnosis not present

## 2016-08-09 DIAGNOSIS — R509 Fever, unspecified: Secondary | ICD-10-CM

## 2016-08-09 DIAGNOSIS — C712 Malignant neoplasm of temporal lobe: Secondary | ICD-10-CM

## 2016-08-09 LAB — CBC WITH DIFFERENTIAL/PLATELET
BASO%: 0.1 % (ref 0.0–2.0)
BASOS ABS: 0 10*3/uL (ref 0.0–0.1)
EOS ABS: 0 10*3/uL (ref 0.0–0.5)
EOS%: 0 % (ref 0.0–7.0)
HCT: 33.2 % — ABNORMAL LOW (ref 34.8–46.6)
HEMOGLOBIN: 11.1 g/dL — AB (ref 11.6–15.9)
LYMPH%: 3.9 % — ABNORMAL LOW (ref 14.0–49.7)
MCH: 29.3 pg (ref 25.1–34.0)
MCHC: 33.5 g/dL (ref 31.5–36.0)
MCV: 87.3 fL (ref 79.5–101.0)
MONO#: 0.2 10*3/uL (ref 0.1–0.9)
MONO%: 3.3 % (ref 0.0–14.0)
NEUT%: 92.7 % — ABNORMAL HIGH (ref 38.4–76.8)
NEUTROS ABS: 6.3 10*3/uL (ref 1.5–6.5)
PLATELETS: 135 10*3/uL — AB (ref 145–400)
RBC: 3.8 10*6/uL (ref 3.70–5.45)
RDW: 18.5 % — AB (ref 11.2–14.5)
WBC: 6.8 10*3/uL (ref 3.9–10.3)
lymph#: 0.3 10*3/uL — ABNORMAL LOW (ref 0.9–3.3)

## 2016-08-09 LAB — COMPREHENSIVE METABOLIC PANEL
ALBUMIN: 3.3 g/dL — AB (ref 3.5–5.0)
ALK PHOS: 48 U/L (ref 40–150)
ALT: 14 U/L (ref 0–55)
ANION GAP: 12 meq/L — AB (ref 3–11)
AST: 17 U/L (ref 5–34)
BILIRUBIN TOTAL: 0.56 mg/dL (ref 0.20–1.20)
BUN: 15.1 mg/dL (ref 7.0–26.0)
CALCIUM: 9.9 mg/dL (ref 8.4–10.4)
CO2: 29 mEq/L (ref 22–29)
Chloride: 96 mEq/L — ABNORMAL LOW (ref 98–109)
Creatinine: 0.7 mg/dL (ref 0.6–1.1)
Glucose: 84 mg/dl (ref 70–140)
Potassium: 4.7 mEq/L (ref 3.5–5.1)
Sodium: 137 mEq/L (ref 136–145)
TOTAL PROTEIN: 6.9 g/dL (ref 6.4–8.3)

## 2016-08-09 LAB — URINALYSIS, MICROSCOPIC - CHCC
BILIRUBIN (URINE): NEGATIVE
BLOOD: NEGATIVE
GLUCOSE UR CHCC: NEGATIVE mg/dL
Ketones: NEGATIVE mg/dL
LEUKOCYTE ESTERASE: NEGATIVE
NITRITE: NEGATIVE
PH: 6 (ref 4.6–8.0)
Protein: <30 mg/dL
RBC / HPF: NEGATIVE (ref 0–2)
Specific Gravity, Urine: 1.01 (ref 1.003–1.035)
UROBILINOGEN UR: 0.2 mg/dL (ref 0.2–1)

## 2016-08-09 NOTE — Telephone Encounter (Signed)
Gave patient/relative avs report and appointments for July. Central radiology will call re scan.

## 2016-08-09 NOTE — Progress Notes (Addendum)
  [facility] OFFICE PROGRESS NOTE   Diagnosis:  Brain tumor  INTERVAL HISTORY:   Jaime [name] returns for follow-up. She completed radiation and temozolomide 07/24/2016. She was seen in the emergency department 08/04/2016 with vomiting. She notes improvement in the nausea over the past several days. No recent vomiting. She has occasional headaches. She continues Decadron 4 mg twice a day. She denies shortness of breath. No cough. No leg swelling or calf pain. No hematuria or dysuria. Appetite remains poor. She continues to have balance problems.  Objective:  Vital signs in last 24 hours:  Blood pressure 94/68, pulse 100, temperature 100.3 F (37.9 C), temperature source Oral, resp. rate 18, height 5' 5.5" (1.664 m), weight 145 lb 6.4 oz (66 kg), last menstrual period 10/31/2005, SpO2 99 %.    HEENT: No thrush or ulcers. Resp: Lungs clear bilaterally. Cardio: Regular rate and rhythm. GI: Abdomen soft and nontender. No hepatomegaly. Vascular: No leg edema. Calves soft and nontender. Neuro: Alert, oriented. Follows commands. Gait appears normal.     Lab Results:  Lab Results  Component Value Date   WBC 7.0 07/27/2016   HGB 9.5 (L) 08/04/2016   HCT 28.0 (L) 08/04/2016   MCV 86.0 07/27/2016   PLT 205 07/27/2016   NEUTROABS 6.5 07/27/2016    Imaging:  No results found.  Medications: I have reviewed the patient's current medications.  Assessment/Plan: 1. History of iron deficiency anemia. Mild normocytic anemia during hospital admission May 2018 2. Sickle cell trait. 3. Status post hysterectomy. 4. History of dysphagia. She underwent an endoscopy by Dr. Collene Mares.  5. Diabetes 6. Centrally necrotic, enhancing left temporal mass on MRI brain 06/18/2016-imaging features consistent with a high-grade glioma  CTs chest, abdomen, and pelvis 06/20/2016-no evidence of a primary tumor site or metastatic disease  Initiation of radiation/temozolomide 07/03/2016; Completed  07/24/2016 7. History of Nausea/vomiting and headaches secondary to #6 8. Dry skin rash both hands. 9. Viral appearing lesions right medial buttock. Status post 7 day course of Valtrex-healed 10. Markedly elevated BUN/creatinine 07/24/2016-dehydration?Marland Kitchen She received IV fluids. Labs improved 07/27/2016   Disposition: Jaime [name] appears stable. She completed the course of radiation/temozolomide 07/24/2016. We are referring her for a restaging MRI of the brain.  She has a low-grade fever in the office today. We discussed possible etiologies. She will return to the lab today for a urinalysis.  Appetite/intake remains poor. She is losing weight. We will obtain a repeat chemistry panel today. A referral was made to the [facility] [facility] dietitian regarding nutritional supplements.  She will return for a follow-up visit on 08/21/2016. She will contact the office in the interim with any problems.  Patient seen with Dr. Benay Spice.    Ned Card ANP/GNP-BC   08/09/2016  2:14 PM  This was a shared visit with Ned Card. Jaime [name] continues to have nausea and anorexia. We are concerned the brain tumor has progressed. She will be referred for a restaging MRI.We will consider a referral to neuro-oncology if the MRI confirms disease progression.  Julieanne Manson, M.D.

## 2016-08-11 ENCOUNTER — Ambulatory Visit (HOSPITAL_COMMUNITY)
Admission: RE | Admit: 2016-08-11 | Discharge: 2016-08-11 | Disposition: A | Discharge status: Home / Self Care | Payer: Medicaid Other | Source: Ambulatory Visit | Attending: Nurse Practitioner | Admitting: Nurse Practitioner

## 2016-08-11 ENCOUNTER — Telehealth: Payer: Self-pay | Admitting: *Deleted

## 2016-08-11 ENCOUNTER — Ambulatory Visit: Payer: Medicaid Other | Admitting: Nutrition

## 2016-08-11 ENCOUNTER — Other Ambulatory Visit: Payer: Self-pay | Admitting: *Deleted

## 2016-08-11 DIAGNOSIS — C712 Malignant neoplasm of temporal lobe: Secondary | ICD-10-CM | POA: Insufficient documentation

## 2016-08-11 MED ORDER — GADOBENATE DIMEGLUMINE 529 MG/ML IV SOLN
15.0000 mL | Freq: Once | INTRAVENOUS | Status: AC | PRN
  Administered 2016-08-11: 15 mL via INTRAVENOUS

## 2016-08-11 NOTE — Progress Notes (Signed)
51 year old female diagnosed with a brain tumor.  Past medical history includes steroid-induced diabetes, intellectual disability, GERD, and dysphasia.  Medications include Decadron, Pepcid, NovoLog, Lantus, Ativan, Zofran, Temodar, and metformin.  Labs include albumin 3.3 on July 11.  Height: 65.5 inches. Weight: 145.4 pounds. Usual body weight: 166.2 pounds on May 24th, 2018 BMI: 23.83.  Patient reports she has poor appetite with occasional nausea and vomiting. She describes taste alterations, which she resolves by eating baby food instead of "regular" food. Patient has lost 13% of her body weight over 2 months. Reports she drinks premier protein 3 times a day. She eats at least 3 meals daily, a combination of regular adult food and baby food. Nutrition focused physical exam was deferred today.  Patient does not feel well.  Nutrition diagnosis:  Unintended weight loss related to brain tumor and associated treatments as evidenced by 20 pound weight loss over 2 months.  Intervention: Educated patient on strategies for increasing calories and protein. Encouraged patient continue oral nutrition supplements. Reviewed soft moist foods patient may tolerate Educated patient on taste alterations. Provided fact sheets.  Provided samples.  Questions were answered.  Teach back method used and contact information was provided.  Monitoring, evaluation, goals: Patient will tolerate adequate calories and protein to minimize weight loss.  Next visit: To be scheduled as needed.  **Disclaimer: This note was dictated with voice recognition software. Similar sounding words can inadvertently be transcribed and this note may contain transcription errors which may not have been corrected upon publication of note.**

## 2016-08-11 NOTE — Telephone Encounter (Signed)
Received walk-in form from registration. Pt and her mother in the lobby. Pt c/o increasing headache.Jaime [name] with pt and her mother.  Pt states she started getting worsening of her headache yesterday. Her mother gave her 2 aspirin w/o relief. Reviewed meds. No other pain meds. Pt on decadron 4 mg BID-pt has glioblastoma.  Discussed with Ned Card, NP with Dr. Benay Spice.  Pt saw Lattie Haw 2 days ago.  Lattie Haw requesting stat brain MRI today to elevate status of brain tumor/edema.  Set up MRI w and w/o contrast for 2pm @ [facility]. Informed pt and her mother of above arrangements.Lattie Haw to f/u with them after results of MRI received.  Pt and mother voiced understanding.

## 2016-08-13 ENCOUNTER — Emergency Department (HOSPITAL_COMMUNITY)
Admission: EM | Admit: 2016-08-13 | Discharge: 2016-08-13 | Disposition: A | Discharge status: Home / Self Care | Payer: Medicaid Other | Attending: Emergency Medicine | Admitting: Emergency Medicine

## 2016-08-13 ENCOUNTER — Encounter (HOSPITAL_COMMUNITY): Payer: Self-pay | Admitting: Emergency Medicine

## 2016-08-13 DIAGNOSIS — G43A0 Cyclical vomiting, not intractable: Secondary | ICD-10-CM | POA: Insufficient documentation

## 2016-08-13 DIAGNOSIS — C712 Malignant neoplasm of temporal lobe: Secondary | ICD-10-CM | POA: Insufficient documentation

## 2016-08-13 DIAGNOSIS — Z7982 Long term (current) use of aspirin: Secondary | ICD-10-CM | POA: Insufficient documentation

## 2016-08-13 DIAGNOSIS — E119 Type 2 diabetes mellitus without complications: Secondary | ICD-10-CM | POA: Insufficient documentation

## 2016-08-13 DIAGNOSIS — Z79899 Other long term (current) drug therapy: Secondary | ICD-10-CM | POA: Diagnosis not present

## 2016-08-13 DIAGNOSIS — R110 Nausea: Secondary | ICD-10-CM | POA: Diagnosis present

## 2016-08-13 DIAGNOSIS — R1115 Cyclical vomiting syndrome unrelated to migraine: Secondary | ICD-10-CM

## 2016-08-13 DIAGNOSIS — I10 Essential (primary) hypertension: Secondary | ICD-10-CM | POA: Insufficient documentation

## 2016-08-13 DIAGNOSIS — Z794 Long term (current) use of insulin: Secondary | ICD-10-CM | POA: Insufficient documentation

## 2016-08-13 LAB — COMPREHENSIVE METABOLIC PANEL
ALT: 15 U/L (ref 14–54)
AST: 20 U/L (ref 15–41)
Albumin: 3 g/dL — ABNORMAL LOW (ref 3.5–5.0)
Alkaline Phosphatase: 42 U/L (ref 38–126)
Anion gap: 7 (ref 5–15)
BUN: 19 mg/dL (ref 6–20)
CO2: 31 mmol/L (ref 22–32)
CREATININE: 0.94 mg/dL (ref 0.44–1.00)
Calcium: 8.5 mg/dL — ABNORMAL LOW (ref 8.9–10.3)
Chloride: 98 mmol/L — ABNORMAL LOW (ref 101–111)
Glucose, Bld: 86 mg/dL (ref 65–99)
Potassium: 3.8 mmol/L (ref 3.5–5.1)
Sodium: 136 mmol/L (ref 135–145)
Total Bilirubin: 0.3 mg/dL (ref 0.3–1.2)
Total Protein: 5.9 g/dL — ABNORMAL LOW (ref 6.5–8.1)

## 2016-08-13 LAB — CBC WITH DIFFERENTIAL/PLATELET
Basophils Absolute: 0 10*3/uL (ref 0.0–0.1)
Basophils Relative: 0 %
Eosinophils Absolute: 0 10*3/uL (ref 0.0–0.7)
Eosinophils Relative: 0 %
HCT: 24.1 % — ABNORMAL LOW (ref 36.0–46.0)
HEMOGLOBIN: 8.4 g/dL — AB (ref 12.0–15.0)
LYMPHS ABS: 0.6 10*3/uL — AB (ref 0.7–4.0)
LYMPHS PCT: 12 %
MCH: 29.4 pg (ref 26.0–34.0)
MCHC: 34.9 g/dL (ref 30.0–36.0)
MCV: 84.3 fL (ref 78.0–100.0)
Monocytes Absolute: 0.1 10*3/uL (ref 0.1–1.0)
Monocytes Relative: 1 %
NEUTROS PCT: 87 %
Neutro Abs: 4.2 10*3/uL (ref 1.7–7.7)
Platelets: 91 10*3/uL — ABNORMAL LOW (ref 150–400)
RBC: 2.86 MIL/uL — AB (ref 3.87–5.11)
RDW: 16.5 % — ABNORMAL HIGH (ref 11.5–15.5)
WBC: 4.8 10*3/uL (ref 4.0–10.5)

## 2016-08-13 MED ORDER — SODIUM CHLORIDE 0.9 % IV BOLUS (SEPSIS)
1000.0000 mL | Freq: Once | INTRAVENOUS | Status: AC
  Administered 2016-08-13: 1000 mL via INTRAVENOUS

## 2016-08-13 NOTE — ED Provider Notes (Signed)
[place] DEPT Provider Note   CSN: [id number] Arrival date & time: 08/13/16  1542     History   Chief Complaint Chief Complaint  Patient presents with  . Nausea  . Emesis  . Hypotension    HPI Jaime [name] is a 51 y.o. female.  The history is provided by the patient.  Emesis   This is a recurrent problem. The current episode started 1 to 2 hours ago. The problem occurs 2 to 4 times per day. The problem has not changed since onset.The emesis has an appearance of stomach contents. There has been no fever. Associated symptoms include headaches (chronic headache). Pertinent negatives include no abdominal pain, no chills, no cough, no diarrhea, no fever and no myalgias.   Pt underwent palliative chemoradiation for GBM, last session was 2 weeks ago.   Past Medical History:  Diagnosis Date  . Candida, oral 06/22/2016   Due to steroids 06/22/16  . Diabetes mellitus   . DUB (dysfunctional uterine bleeding)    total abdominal hysterectomy in 2007, ovaries intact  . Furuncle of multiple sites    multiple I and D's  . GERD (gastroesophageal reflux disease)   . Hydradenitis   . Intellectual disability   . 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 Furuncle of labia majora 06/29/2016  . 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 Palliative care by specialist   . HLD (hyperlipidemia) 06/18/2016  . GERD (gastroesophageal reflux disease) 06/18/2016  . Brain tumor ([place])   . Nonproliferative diabetic retinopathy ([place]) 12/17/2015  . Onychomycosis of left great toe 01/13/2014  . DNR (do not resuscitate) discussion 09/23/2013  . Hypertension 06/11/2013  . Diabetes mellitus type 2 with retinopathy ([place]) 11/30/2009  . Obesity 09/28/2009  . SICKLE-CELL TRAIT 09/28/2009    Past Surgical History:  Procedure Laterality Date  . ABDOMINAL  HYSTERECTOMY    . HYDRADENITIS EXCISION    . TONSILLECTOMY AND ADENOIDECTOMY     in early childhood    OB History    No data available       Home Medications    Prior to Admission medications   Medication Sig Start Date End Date Taking? Authorizing Provider  aspirin EC 81 MG tablet Take 81 mg by mouth daily.   Yes [provider]  dexamethasone (DECADRON) 4 MG tablet Take 1 tablet (4 mg total) by mouth 2 (two) times daily with a meal. 07/24/16  Yes Ladell Pier, MD  famotidine (PEPCID) 20 MG tablet Take 1 tablet (20 mg total) by mouth 2 (two) times daily. 07/03/16  Yes Minus Liberty, MD  insulin aspart (NOVOLOG FLEXPEN) 100 UNIT/ML FlexPen Inject 5 Units into the skin 3 (three) times daily with meals. 06/21/16  Yes Holley Raring, MD  insulin glargine (LANTUS) 100 unit/mL SOPN Inject 0.17 mLs (17 Units total) into the skin at bedtime. 06/28/16  Yes Alphonzo Grieve, MD  levETIRAcetam (KEPPRA) 500 MG tablet TAKE 1 TABLET BY MOUTH TWICE DAILY 07/17/16  Yes Minus Liberty, MD  metFORMIN (GLUCOPHAGE) 1000 MG tablet Take 1 tablet (1,000 mg total) by mouth 2 (two) times daily with a meal. 06/21/16 09/19/16 Yes Holley Raring, MD  montelukast (SINGULAIR) 10 MG tablet Take 1 tablet (10 mg total) by mouth at bedtime. 06/08/16  Yes Barnet Glasgow, NP  ondansetron (ZOFRAN ODT) 4 MG disintegrating tablet Take 1 tablet (4 mg total) by mouth  every 8 (eight) hours as needed for nausea or vomiting. 07/24/16  Yes Ladell Pier, MD  valACYclovir (VALTREX) 1000 MG tablet Take 1,000 mg by mouth daily.  07/11/16  Yes [provider]  ACCU-CHEK AVIVA PLUS test strip USE TO TEST BLOOD SUGAR FOUR TIMES DAILY 07/11/16   Minus Liberty, MD  ACCU-CHEK SOFTCLIX LANCETS lancets USE TO CHECK BLOOD SUGAR FOUR TIMES DAILY 07/11/16   Minus Liberty, MD  acetaminophen (TYLENOL) 500 MG tablet Take 500 mg by mouth every 6 (six) hours as needed for moderate pain.     [provider]  blood glucose meter kit and supplies Dispense based on patient and insurance preference. Use up to four times daily as directed. (FOR ICD-9 250.00, 250.01). 06/21/16   Holley Raring, MD  emollient (BIAFINE) cream Apply 1 application topically daily. 07/03/16   [provider]  fluticasone (FLONASE) 50 MCG/ACT nasal spray Place 2 sprays into both nostrils daily. Patient not taking: Reported on 08/13/2016 06/08/16   Barnet Glasgow, NP  Insulin Pen Needle 31G X 5 MM MISC Use for injections under the skin as directed. 06/21/16   Holley Raring, MD  lisinopril (PRINIVIL,ZESTRIL) 40 MG tablet Take 1 tablet (40 mg total) by mouth daily. 07/07/16 08/06/16  Minus Liberty, MD  loratadine (CLARITIN) 10 MG tablet Take 1 tablet (10 mg total) by mouth daily. Patient not taking: Reported on 08/13/2016 07/03/16   Minus Liberty, MD  LORazepam (ATIVAN) 0.5 MG tablet 1 tablet po 30 minutes prior to radiation or MRI 06/21/16   Hayden , PA-C  temozolomide [facility]) 140 MG capsule Take 1 capsule (140 mg total) by mouth daily. May take on an empty stomach or at bedtime to decrease nausea & vomiting. 06/27/16   Ladell Pier, MD    Family History Family History  Problem Relation Age of Onset  . Cancer Mother 30       Breast cancer  . Hypertension Mother   . Depression Mother   . Sickle cell trait Mother   . Cancer Father 81       lung cancer, was a long term smoker    Social History Social History  Substance Use Topics  . Smoking status: Never Smoker  . Smokeless tobacco: Never Used  . Alcohol use No     Allergies   Patient has no known allergies.   Review of Systems Review of Systems  Constitutional: Negative for chills and fever.  Respiratory: Negative for cough.   Gastrointestinal: Positive for vomiting. Negative for abdominal pain and diarrhea.  Musculoskeletal: Negative for myalgias.  Neurological: Positive for headaches (chronic headache).  All other systems  are reviewed and are negative for acute change except as noted in the HPI    Physical Exam Updated Vital Signs BP (!) 88/57 (BP Location: Left Arm)   Pulse (!) 112   Temp 98.1 F (36.7 C) (Oral)   Resp (!) 22   LMP 10/31/2005   SpO2 100%   Physical Exam  Constitutional: She is oriented to person, place, and time. She appears well-developed. She appears ill. No distress.  HENT:  Head: Normocephalic and atraumatic.  Nose: Nose normal.  Eyes: Pupils are equal, round, and reactive to light. Conjunctivae and EOM are normal. Right eye exhibits no discharge. Left eye exhibits no discharge. No scleral icterus.  Neck: Normal range of motion. Neck supple.  Cardiovascular: Normal rate and regular rhythm.  Exam reveals no gallop and no friction rub.   No murmur  heard. Pulmonary/Chest: Effort normal and breath sounds normal. No stridor. No respiratory distress. She has no rales.  Abdominal: Soft. She exhibits no distension. There is no tenderness.  Musculoskeletal: She exhibits no edema or tenderness.  Neurological: She is alert and oriented to person, place, and time. She has normal strength. No cranial nerve deficit or sensory deficit.  Skin: Skin is warm and dry. No rash noted. She is not diaphoretic. No erythema.  Psychiatric: She has a normal mood and affect.  Vitals reviewed.    ED Treatments / Results  Labs (all labs ordered are listed, but only abnormal results are displayed) Labs Reviewed  CBC WITH DIFFERENTIAL/PLATELET - Abnormal; Notable for the following:       Result Value   RBC 2.86 (*)    Hemoglobin 8.4 (*)    HCT 24.1 (*)    RDW 16.5 (*)    Platelets 91 (*)    Lymphs Abs 0.6 (*)    All other components within normal limits  COMPREHENSIVE METABOLIC PANEL - Abnormal; Notable for the following:    Chloride 98 (*)    Calcium 8.5 (*)    Total Protein 5.9 (*)    Albumin 3.0 (*)    All other components within normal limits    EKG  EKG Interpretation None        Radiology No results found.  Procedures Procedures (including critical care time)  Medications Ordered in ED Medications  sodium chloride 0.9 % bolus 1,000 mL (0 mLs Intravenous Stopped 08/13/16 1917)  sodium chloride 0.9 % bolus 1,000 mL (0 mLs Intravenous Stopped 08/13/16 1902)     Initial Impression / Assessment and Plan / ED Course  I have reviewed the triage vital signs and the nursing notes.  Pertinent labs & imaging results that were available during my care of the patient were reviewed by me and considered in my medical decision making (see chart for details).     Symptomatically improved following Zofran by EMS. Soft blood pressures however on reviewing her record patient has had soft blood pressures at baseline. Provided patient with IV fluid. No additional antibiotics required. Able to tolerate by mouth. Currently asymptomatic.  CBC hemodiluted. Otherwise reassuring.   On reviewing the records recent MRI revealed mild interval increase in the tumor however significant improvement and cerebral edema.  Patient has follow-up with Dr. Benay Spice.   Patient and family feel comfortable with discharge. Safe for discharge with strict return precautions.  Final Clinical Impressions(s) / ED Diagnoses   Final diagnoses:  Non-intractable cyclical vomiting with nausea   Disposition: Discharge  Condition: Good  I have discussed the results, Dx and Tx plan with the patient who expressed understanding and agree(s) with the plan. Discharge instructions discussed at great length. The patient was given strict return precautions who verbalized understanding of the instructions. No further questions at time of discharge.    New Prescriptions   No medications on file    Follow Up: Ladell Pier, MD [place] [id number] [phone]  On 08/21/2016 as scheduled      Fatima Blank, MD 08/13/16 2204

## 2016-08-13 NOTE — ED Notes (Signed)
Bed: WA20 Expected date: 08/13/16 Expected time: 3:46 PM Means of arrival: Ambulance Comments: Ca pt

## 2016-08-13 NOTE — ED Notes (Signed)
Writer provided pt with oral hydration of H2O

## 2016-08-13 NOTE — ED Notes (Signed)
Writer attempted to draw blood, unsuccessful

## 2016-08-13 NOTE — ED Notes (Signed)
No respiratory or acute distress noted alert and oriented x 3 no pain voiced states she feels better drinking water without difficulty no n/v noted or voiced family at bedside call light in reach.

## 2016-08-13 NOTE — ED Triage Notes (Signed)
Patient in with mother today with complaints of nausea and vomiting that started this am. Hx of brain cancer, oral chemo.

## 2016-08-14 ENCOUNTER — Telehealth: Payer: Self-pay | Admitting: Nurse Practitioner

## 2016-08-14 ENCOUNTER — Other Ambulatory Visit: Payer: Self-pay | Admitting: Nurse Practitioner

## 2016-08-14 DIAGNOSIS — C712 Malignant neoplasm of temporal lobe: Secondary | ICD-10-CM

## 2016-08-14 NOTE — Telephone Encounter (Signed)
Scheduled appt per sch message from [place] - left emssage with appt date and time.

## 2016-08-17 ENCOUNTER — Other Ambulatory Visit (HOSPITAL_BASED_OUTPATIENT_CLINIC_OR_DEPARTMENT_OTHER): Payer: Medicaid Other

## 2016-08-17 ENCOUNTER — Encounter: Payer: Self-pay | Admitting: Oncology

## 2016-08-17 DIAGNOSIS — C801 Malignant (primary) neoplasm, unspecified: Secondary | ICD-10-CM

## 2016-08-17 DIAGNOSIS — C7931 Secondary malignant neoplasm of brain: Secondary | ICD-10-CM | POA: Diagnosis not present

## 2016-08-17 DIAGNOSIS — C712 Malignant neoplasm of temporal lobe: Secondary | ICD-10-CM

## 2016-08-17 LAB — CBC WITH DIFFERENTIAL/PLATELET
BASO%: 0.7 % (ref 0.0–2.0)
Basophils Absolute: 0 10*3/uL (ref 0.0–0.1)
EOS%: 0.2 % (ref 0.0–7.0)
Eosinophils Absolute: 0 10*3/uL (ref 0.0–0.5)
HCT: 27.3 % — ABNORMAL LOW (ref 34.8–46.6)
HGB: 8.9 g/dL — ABNORMAL LOW (ref 11.6–15.9)
LYMPH%: 28.5 % (ref 14.0–49.7)
MCH: 28.7 pg (ref 25.1–34.0)
MCHC: 32.6 g/dL (ref 31.5–36.0)
MCV: 88.1 fL (ref 79.5–101.0)
MONO#: 0.1 10*3/uL (ref 0.1–0.9)
MONO%: 2.9 % (ref 0.0–14.0)
NEUT#: 2.8 10*3/uL (ref 1.5–6.5)
NEUT%: 67.7 % (ref 38.4–76.8)
Platelets: 86 10*3/uL — ABNORMAL LOW (ref 145–400)
RBC: 3.1 10*6/uL — ABNORMAL LOW (ref 3.70–5.45)
RDW: 16.8 % — ABNORMAL HIGH (ref 11.2–14.5)
WBC: 4.1 10*3/uL (ref 3.9–10.3)
lymph#: 1.2 10*3/uL (ref 0.9–3.3)
nRBC: 0 % (ref 0–0)

## 2016-08-17 MED FILL — BOOST BREEZE LIQUID: 9 days supply | Qty: 6399 | Fill #0

## 2016-08-17 NOTE — Progress Notes (Signed)
Met with patient and mother whom brought proof of income for [place]. Patient approved for one-time $400 [place]. Patient has a copy of the approval letter as well as the expense sheet it covers. Information to Outpatient pharmacy given as well. Patient needed a prescription for the boost. Martin Majestic to Dr.Feng who wrote the prescription in Dr.Sherrill's absence. Patient will be able to use the grant funds to cover cost.  Patient also needed assistance with light bill that was scheduled for disconnection. She didn't have copy of bill but was able to call to verify information with [employer] and will bring bill on Monday 08/21/16. I was able to pledge funding with grant funds to avoid disconnect.  Advised patient/mom to take prescription for boost to the outpatient pharmacy to have filled today. They verbalized understanding.  They have my card for any additional financial questions or concerns.  They also mentioned a need for a cane. Advised them after speaking with social worker(Lauren) that the doctor would need to write an order and may have to get filled at the medical supply.

## 2016-08-20 ENCOUNTER — Emergency Department (HOSPITAL_COMMUNITY): Payer: Medicaid Other

## 2016-08-20 ENCOUNTER — Encounter (HOSPITAL_COMMUNITY): Payer: Self-pay

## 2016-08-20 ENCOUNTER — Inpatient Hospital Stay (HOSPITAL_COMMUNITY)
Admission: EM | Admit: 2016-08-20 | Discharge: 2016-08-26 | DRG: 808 | Disposition: A | Discharge status: Home Health | Payer: Medicaid Other | Attending: Internal Medicine | Admitting: Internal Medicine

## 2016-08-20 DIAGNOSIS — E11319 Type 2 diabetes mellitus with unspecified diabetic retinopathy without macular edema: Secondary | ICD-10-CM | POA: Diagnosis present

## 2016-08-20 DIAGNOSIS — Z6823 Body mass index (BMI) 23.0-23.9, adult: Secondary | ICD-10-CM

## 2016-08-20 DIAGNOSIS — Z7982 Long term (current) use of aspirin: Secondary | ICD-10-CM | POA: Diagnosis not present

## 2016-08-20 DIAGNOSIS — C712 Malignant neoplasm of temporal lobe: Secondary | ICD-10-CM | POA: Diagnosis present

## 2016-08-20 DIAGNOSIS — D573 Sickle-cell trait: Secondary | ICD-10-CM | POA: Diagnosis present

## 2016-08-20 DIAGNOSIS — Z818 Family history of other mental and behavioral disorders: Secondary | ICD-10-CM | POA: Diagnosis not present

## 2016-08-20 DIAGNOSIS — E860 Dehydration: Secondary | ICD-10-CM | POA: Diagnosis present

## 2016-08-20 DIAGNOSIS — Z794 Long term (current) use of insulin: Secondary | ICD-10-CM

## 2016-08-20 DIAGNOSIS — R7989 Other specified abnormal findings of blood chemistry: Secondary | ICD-10-CM | POA: Diagnosis not present

## 2016-08-20 DIAGNOSIS — Z803 Family history of malignant neoplasm of breast: Secondary | ICD-10-CM | POA: Diagnosis not present

## 2016-08-20 DIAGNOSIS — G936 Cerebral edema: Secondary | ICD-10-CM | POA: Diagnosis present

## 2016-08-20 DIAGNOSIS — E876 Hypokalemia: Secondary | ICD-10-CM | POA: Diagnosis not present

## 2016-08-20 DIAGNOSIS — Z90710 Acquired absence of both cervix and uterus: Secondary | ICD-10-CM | POA: Diagnosis not present

## 2016-08-20 DIAGNOSIS — D649 Anemia, unspecified: Secondary | ICD-10-CM

## 2016-08-20 DIAGNOSIS — E43 Unspecified severe protein-calorie malnutrition: Secondary | ICD-10-CM | POA: Diagnosis present

## 2016-08-20 DIAGNOSIS — D61818 Other pancytopenia: Secondary | ICD-10-CM | POA: Diagnosis present

## 2016-08-20 DIAGNOSIS — Z801 Family history of malignant neoplasm of trachea, bronchus and lung: Secondary | ICD-10-CM

## 2016-08-20 DIAGNOSIS — F79 Unspecified intellectual disabilities: Secondary | ICD-10-CM | POA: Diagnosis present

## 2016-08-20 DIAGNOSIS — Z832 Family history of diseases of the blood and blood-forming organs and certain disorders involving the immune mechanism: Secondary | ICD-10-CM | POA: Diagnosis not present

## 2016-08-20 DIAGNOSIS — R6510 Systemic inflammatory response syndrome (SIRS) of non-infectious origin without acute organ dysfunction: Secondary | ICD-10-CM

## 2016-08-20 DIAGNOSIS — D509 Iron deficiency anemia, unspecified: Secondary | ICD-10-CM | POA: Diagnosis not present

## 2016-08-20 DIAGNOSIS — I959 Hypotension, unspecified: Secondary | ICD-10-CM | POA: Diagnosis not present

## 2016-08-20 DIAGNOSIS — T451X5A Adverse effect of antineoplastic and immunosuppressive drugs, initial encounter: Secondary | ICD-10-CM | POA: Diagnosis present

## 2016-08-20 DIAGNOSIS — Z66 Do not resuscitate: Secondary | ICD-10-CM | POA: Diagnosis present

## 2016-08-20 DIAGNOSIS — D599 Acquired hemolytic anemia, unspecified: Principal | ICD-10-CM | POA: Diagnosis present

## 2016-08-20 DIAGNOSIS — N19 Unspecified kidney failure: Secondary | ICD-10-CM

## 2016-08-20 DIAGNOSIS — R531 Weakness: Secondary | ICD-10-CM | POA: Diagnosis present

## 2016-08-20 DIAGNOSIS — Z923 Personal history of irradiation: Secondary | ICD-10-CM

## 2016-08-20 DIAGNOSIS — E871 Hypo-osmolality and hyponatremia: Secondary | ICD-10-CM | POA: Diagnosis not present

## 2016-08-20 DIAGNOSIS — A419 Sepsis, unspecified organism: Secondary | ICD-10-CM

## 2016-08-20 DIAGNOSIS — R109 Unspecified abdominal pain: Secondary | ICD-10-CM

## 2016-08-20 LAB — URINALYSIS, ROUTINE W REFLEX MICROSCOPIC
Bilirubin Urine: NEGATIVE
Glucose, UA: 50 mg/dL — AB
HGB URINE DIPSTICK: NEGATIVE
Ketones, ur: NEGATIVE mg/dL
Nitrite: NEGATIVE
PROTEIN: NEGATIVE mg/dL
SPECIFIC GRAVITY, URINE: 1.01 (ref 1.005–1.030)
pH: 5 (ref 5.0–8.0)

## 2016-08-20 LAB — BRAIN NATRIURETIC PEPTIDE: B NATRIURETIC PEPTIDE 5: 39.9 pg/mL (ref 0.0–100.0)

## 2016-08-20 LAB — COMPREHENSIVE METABOLIC PANEL
ALBUMIN: 2.7 g/dL — AB (ref 3.5–5.0)
ALK PHOS: 53 U/L (ref 38–126)
ALT: 23 U/L (ref 14–54)
ANION GAP: 9 (ref 5–15)
AST: 31 U/L (ref 15–41)
BILIRUBIN TOTAL: 2.8 mg/dL — AB (ref 0.3–1.2)
BUN: 30 mg/dL — AB (ref 6–20)
CO2: 27 mmol/L (ref 22–32)
CREATININE: 0.6 mg/dL (ref 0.44–1.00)
Calcium: 8.6 mg/dL — ABNORMAL LOW (ref 8.9–10.3)
Chloride: 97 mmol/L — ABNORMAL LOW (ref 101–111)
GFR calc Af Amer: >60 mL/min (ref >60)
GFR calc non Af Amer: >60 mL/min (ref >60)
GLUCOSE: 198 mg/dL — AB (ref 65–99)
Potassium: 3.2 mmol/L — ABNORMAL LOW (ref 3.5–5.1)
Sodium: 133 mmol/L — ABNORMAL LOW (ref 135–145)
Total Protein: 5.9 g/dL — ABNORMAL LOW (ref 6.5–8.1)

## 2016-08-20 LAB — TSH: TSH: 1.021 u[IU]/mL (ref 0.350–4.500)

## 2016-08-20 LAB — CBC WITH DIFFERENTIAL/PLATELET
Basophils Absolute: 0 10*3/uL (ref 0.0–0.1)
Basophils Relative: 1 %
Eosinophils Absolute: 0 10*3/uL (ref 0.0–0.7)
Eosinophils Relative: 0 %
HEMATOCRIT: 14.5 % — AB (ref 36.0–46.0)
HEMOGLOBIN: 4.9 g/dL — AB (ref 12.0–15.0)
LYMPHS ABS: 1 10*3/uL (ref 0.7–4.0)
LYMPHS PCT: 33 %
MCH: 29.2 pg (ref 26.0–34.0)
MCHC: 33.8 g/dL (ref 30.0–36.0)
MCV: 86.3 fL (ref 78.0–100.0)
MONOS PCT: 5 %
Monocytes Absolute: 0.2 10*3/uL (ref 0.1–1.0)
NEUTROS PCT: 61 %
Neutro Abs: 1.8 10*3/uL (ref 1.7–7.7)
Platelets: 106 10*3/uL — ABNORMAL LOW (ref 150–400)
RBC: 1.68 MIL/uL — AB (ref 3.87–5.11)
RDW: 16.6 % — ABNORMAL HIGH (ref 11.5–15.5)
WBC: 3 10*3/uL — AB (ref 4.0–10.5)

## 2016-08-20 LAB — I-STAT CG4 LACTIC ACID, ED
LACTIC ACID, VENOUS: 1.67 mmol/L (ref 0.5–1.9)
Lactic Acid, Venous: 2.24 mmol/L (ref 0.5–1.9)

## 2016-08-20 LAB — GLUCOSE, CAPILLARY
GLUCOSE-CAPILLARY: 219 mg/dL — AB (ref 65–99)
Glucose-Capillary: 159 mg/dL — ABNORMAL HIGH (ref 65–99)

## 2016-08-20 LAB — POC OCCULT BLOOD, ED: FECAL OCCULT BLD: NEGATIVE

## 2016-08-20 LAB — ABO/RH: ABO/RH(D): A POS

## 2016-08-20 LAB — PREPARE RBC (CROSSMATCH)

## 2016-08-20 LAB — TROPONIN I: Troponin I: <0.03 ng/mL (ref <0.03)

## 2016-08-20 MED ORDER — TEMOZOLOMIDE 20 MG PO CAPS: 140.0000 mg | ORAL_CAPSULE | Freq: Every day | ORAL | Status: DC

## 2016-08-20 MED ORDER — INSULIN ASPART 100 UNIT/ML ~~LOC~~ SOLN
0.0000 [IU] | Freq: Every day | SUBCUTANEOUS | Status: DC
  Administered 2016-08-20: 2 [IU] via SUBCUTANEOUS
  Administered 2016-08-21: 4 [IU] via SUBCUTANEOUS
  Administered 2016-08-22: 3 [IU] via SUBCUTANEOUS
  Administered 2016-08-24: 5 [IU] via SUBCUTANEOUS
  Administered 2016-08-25: 4 [IU] via SUBCUTANEOUS

## 2016-08-20 MED ORDER — BOOST BREEZE PO LIQD: 1.0000 | Freq: Three times a day (TID) | ORAL | Status: DC

## 2016-08-20 MED ORDER — ACETAMINOPHEN 500 MG PO TABS
500.0000 mg | ORAL_TABLET | Freq: Four times a day (QID) | ORAL | Status: DC | PRN
  Administered 2016-08-20 – 2016-08-25 (×3): 500 mg via ORAL
  Filled 2016-08-20 (×3): qty 1

## 2016-08-20 MED ORDER — VALACYCLOVIR HCL 500 MG PO TABS
1000.0000 mg | ORAL_TABLET | Freq: Every day | ORAL | Status: DC
  Administered 2016-08-20 – 2016-08-26 (×7): 1000 mg via ORAL
  Filled 2016-08-20 (×7): qty 2

## 2016-08-20 MED ORDER — PIPERACILLIN-TAZOBACTAM 3.375 G IVPB 30 MIN
3.3750 g | Freq: Once | INTRAVENOUS | Status: AC
  Administered 2016-08-20: 3.375 g via INTRAVENOUS
  Filled 2016-08-20: qty 50

## 2016-08-20 MED ORDER — PIPERACILLIN-TAZOBACTAM 3.375 G IVPB
3.3750 g | Freq: Three times a day (TID) | INTRAVENOUS | Status: DC
  Administered 2016-08-20 – 2016-08-24 (×12): 3.375 g via INTRAVENOUS
  Filled 2016-08-20 (×14): qty 50

## 2016-08-20 MED ORDER — VANCOMYCIN HCL IN DEXTROSE 750-5 MG/150ML-% IV SOLN
750.0000 mg | Freq: Two times a day (BID) | INTRAVENOUS | Status: DC
  Administered 2016-08-21 – 2016-08-23 (×5): 750 mg via INTRAVENOUS
  Filled 2016-08-20 (×5): qty 150

## 2016-08-20 MED ORDER — INSULIN GLARGINE 100 UNIT/ML ~~LOC~~ SOLN
17.0000 [IU] | Freq: Every day | SUBCUTANEOUS | Status: DC
  Administered 2016-08-20 – 2016-08-25 (×6): 17 [IU] via SUBCUTANEOUS
  Filled 2016-08-20 (×7): qty 0.17

## 2016-08-20 MED ORDER — SODIUM CHLORIDE 0.9% FLUSH: 3.0000 mL | INTRAVENOUS | Status: DC | PRN

## 2016-08-20 MED ORDER — BOOST / RESOURCE BREEZE PO LIQD
1.0000 | Freq: Three times a day (TID) | ORAL | Status: DC
  Administered 2016-08-20 – 2016-08-21 (×2): 1 via ORAL

## 2016-08-20 MED ORDER — SODIUM CHLORIDE 0.9% FLUSH
3.0000 mL | Freq: Two times a day (BID) | INTRAVENOUS | Status: DC
Start: 2016-08-20 — End: 2016-08-26
  Administered 2016-08-20 – 2016-08-25 (×11): 3 mL via INTRAVENOUS

## 2016-08-20 MED ORDER — FAMOTIDINE 20 MG PO TABS
20.0000 mg | ORAL_TABLET | Freq: Two times a day (BID) | ORAL | Status: DC
  Administered 2016-08-20 – 2016-08-26 (×12): 20 mg via ORAL
  Filled 2016-08-20 (×12): qty 1

## 2016-08-20 MED ORDER — ONDANSETRON HCL 4 MG PO TABS: 4.0000 mg | ORAL_TABLET | Freq: Four times a day (QID) | ORAL | Status: DC | PRN

## 2016-08-20 MED ORDER — SODIUM CHLORIDE 0.9 % IV BOLUS (SEPSIS)
1000.0000 mL | Freq: Once | INTRAVENOUS | Status: AC
  Administered 2016-08-20: 1000 mL via INTRAVENOUS

## 2016-08-20 MED ORDER — LEVETIRACETAM 500 MG PO TABS
500.0000 mg | ORAL_TABLET | Freq: Two times a day (BID) | ORAL | Status: DC
  Administered 2016-08-20 – 2016-08-26 (×12): 500 mg via ORAL
  Filled 2016-08-20 (×12): qty 1

## 2016-08-20 MED ORDER — SODIUM CHLORIDE 0.9 % IV SOLN
INTRAVENOUS | Status: DC
  Administered 2016-08-20 – 2016-08-22 (×4): via INTRAVENOUS

## 2016-08-20 MED ORDER — DEXAMETHASONE 4 MG PO TABS
4.0000 mg | ORAL_TABLET | Freq: Two times a day (BID) | ORAL | Status: DC
  Administered 2016-08-20 – 2016-08-24 (×8): 4 mg via ORAL
  Filled 2016-08-20: qty 2
  Filled 2016-08-20: qty 1
  Filled 2016-08-20: qty 2
  Filled 2016-08-20: qty 1
  Filled 2016-08-20: qty 2
  Filled 2016-08-20: qty 1
  Filled 2016-08-20 (×2): qty 2

## 2016-08-20 MED ORDER — FLUTICASONE PROPIONATE 50 MCG/ACT NA SUSP: 2.0000 | Freq: Every day | NASAL | Status: DC

## 2016-08-20 MED ORDER — BISACODYL 10 MG RE SUPP: 10.0000 mg | Freq: Every day | RECTAL | Status: DC | PRN

## 2016-08-20 MED ORDER — MONTELUKAST SODIUM 10 MG PO TABS: 10.0000 mg | ORAL_TABLET | Freq: Every day | ORAL | Status: DC

## 2016-08-20 MED ORDER — SODIUM CHLORIDE 0.9 % IV SOLN
Freq: Once | INTRAVENOUS | Status: AC
  Administered 2016-08-20: 11:00:00 via INTRAVENOUS

## 2016-08-20 MED ORDER — INSULIN ASPART 100 UNIT/ML ~~LOC~~ SOLN
0.0000 [IU] | Freq: Three times a day (TID) | SUBCUTANEOUS | Status: DC
  Administered 2016-08-20: 3 [IU] via SUBCUTANEOUS
  Administered 2016-08-21 (×2): 8 [IU] via SUBCUTANEOUS
  Administered 2016-08-21: 3 [IU] via SUBCUTANEOUS
  Administered 2016-08-22 (×2): 11 [IU] via SUBCUTANEOUS
  Administered 2016-08-22: 5 [IU] via SUBCUTANEOUS
  Administered 2016-08-23: 3 [IU] via SUBCUTANEOUS
  Administered 2016-08-23: 15 [IU] via SUBCUTANEOUS
  Administered 2016-08-23 – 2016-08-24 (×2): 3 [IU] via SUBCUTANEOUS
  Administered 2016-08-24: 11 [IU] via SUBCUTANEOUS
  Administered 2016-08-24 – 2016-08-25 (×2): 8 [IU] via SUBCUTANEOUS
  Administered 2016-08-25: 3 [IU] via SUBCUTANEOUS
  Administered 2016-08-25: 11 [IU] via SUBCUTANEOUS
  Administered 2016-08-26 (×2): 3 [IU] via SUBCUTANEOUS

## 2016-08-20 MED ORDER — VANCOMYCIN HCL IN DEXTROSE 1-5 GM/200ML-% IV SOLN
1000.0000 mg | Freq: Once | INTRAVENOUS | Status: AC
  Administered 2016-08-20: 1000 mg via INTRAVENOUS
  Filled 2016-08-20: qty 200

## 2016-08-20 MED ORDER — EMOLLIENT BASE EX CREA: 1.0000 "application " | TOPICAL_CREAM | Freq: Every day | CUTANEOUS | Status: DC

## 2016-08-20 MED ORDER — SODIUM CHLORIDE 0.9 % IV SOLN: 250.0000 mL | INTRAVENOUS | Status: DC | PRN

## 2016-08-20 MED ORDER — ONDANSETRON HCL 4 MG/2ML IJ SOLN: 4.0000 mg | Freq: Four times a day (QID) | INTRAMUSCULAR | Status: DC | PRN

## 2016-08-20 NOTE — ED Notes (Signed)
Pat desat to 87% when sleeping. 2L o2 applied.

## 2016-08-20 NOTE — ED Provider Notes (Signed)
[facility] DEPT Provider Note   CSN: [id number] Arrival date & time: 08/20/16  0749     History   Chief Complaint Chief Complaint  Patient presents with  . Weakness    HPI Jaime [name] is a 51 y.o. female.  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 Mom reports for the last 3 days, she is not been eating or drinking much. Reports that she has severe generalized weakness and is unable to ambulate around home due to this. Reports that she has some confusion at baseline, and this is unchanged. Denies fevers, cough, abdominal pain, vomiting, diarrhea, black or bloody stools, vaginal bleeding, hematuria, dysuria, rash. Denies chest pain, shortness of breath. No recent medication changes.  Past Medical History:  Diagnosis Date  . Candida, oral 06/22/2016   Due to steroids 06/22/16  . Diabetes mellitus   . DUB (dysfunctional uterine bleeding)    total abdominal hysterectomy in 2007, ovaries intact  . Furuncle of multiple sites    multiple I and D's  . GERD (gastroesophageal reflux disease)   . Hydradenitis   . Intellectual disability   . 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 Furuncle of labia majora 06/29/2016  . 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 Palliative care by specialist   . HLD (hyperlipidemia) 06/18/2016  . GERD (gastroesophageal reflux disease) 06/18/2016  . Brain tumor ([place])   . Nonproliferative diabetic retinopathy ([place]) 12/17/2015  . Onychomycosis of left great toe 01/13/2014  . DNR (do not resuscitate) discussion 09/23/2013  . Hypertension 06/11/2013  . Diabetes mellitus type 2 with retinopathy ([place]) 11/30/2009  . Obesity 09/28/2009  . SICKLE-CELL TRAIT 09/28/2009    Past Surgical History:  Procedure Laterality Date  .  ABDOMINAL HYSTERECTOMY    . HYDRADENITIS EXCISION    . TONSILLECTOMY AND ADENOIDECTOMY     in early childhood    OB History    No data available       Home Medications    Prior to Admission medications   Medication Sig Start Date End Date Taking? Authorizing Provider  ACCU-CHEK AVIVA PLUS test strip USE TO TEST BLOOD SUGAR FOUR TIMES DAILY 07/11/16  Yes Minus Liberty, MD  ACCU-CHEK SOFTCLIX LANCETS lancets USE TO CHECK BLOOD SUGAR FOUR TIMES DAILY 07/11/16  Yes Minus Liberty, MD  acetaminophen (TYLENOL) 500 MG tablet Take 500 mg by mouth every 6 (six) hours as needed for moderate pain.    Yes [provider]  aspirin EC 81 MG tablet Take 81 mg by mouth daily.   Yes [provider]  blood glucose meter kit and supplies Dispense based on patient and insurance preference. Use up to four times daily as directed. (FOR ICD-9 250.00, 250.01). 06/21/16  Yes Holley Raring, MD  dexamethasone (DECADRON) 4 MG tablet Take 1 tablet (4 mg total) by mouth 2 (two) times daily with a meal. Patient taking differently: Take 2 mg by mouth daily.  07/24/16  Yes Ladell Pier, MD  emollient (BIAFINE) cream Apply 1 application topically daily. 07/03/16  Yes [provider]  famotidine (PEPCID) 20 MG tablet Take 1 tablet (20 mg total) by mouth 2 (two) times daily. 07/03/16  Yes Minus Liberty, MD  insulin aspart (NOVOLOG FLEXPEN) 100 UNIT/ML FlexPen Inject 5 Units into the skin 3 (three) times daily  with meals. Patient taking differently: Inject 3-5 Units into the skin 3 (three) times daily with meals.  06/21/16  Yes Holley Raring, MD  insulin glargine (LANTUS) 100 unit/mL SOPN Inject 0.17 mLs (17 Units total) into the skin at bedtime. 06/28/16  Yes Alphonzo Grieve, MD  Insulin Pen Needle 31G X 5 MM MISC Use for injections under the skin as directed. 06/21/16  Yes Holley Raring, MD  levETIRAcetam (KEPPRA) 500 MG tablet TAKE 1 TABLET BY MOUTH TWICE DAILY 07/17/16  Yes  Minus Liberty, MD  metFORMIN (GLUCOPHAGE) 1000 MG tablet Take 1 tablet (1,000 mg total) by mouth 2 (two) times daily with a meal. 06/21/16 09/19/16 Yes Holley Raring, MD  Nutritional Supplements (FEEDING SUPPLEMENT, BOOST BREEZE,) LIQD Take 1 Bottle by mouth 3 (three) times daily.  08/17/16  Yes [provider]  ondansetron (ZOFRAN ODT) 4 MG disintegrating tablet Take 1 tablet (4 mg total) by mouth every 8 (eight) hours as needed for nausea or vomiting. 07/24/16  Yes Ladell Pier, MD  valACYclovir (VALTREX) 1000 MG tablet Take 1,000 mg by mouth daily.  07/11/16  Yes [provider]  fluticasone (FLONASE) 50 MCG/ACT nasal spray Place 2 sprays into both nostrils daily. Patient not taking: Reported on 08/13/2016 06/08/16   Barnet Glasgow, NP  lisinopril (PRINIVIL,ZESTRIL) 40 MG tablet Take 1 tablet (40 mg total) by mouth daily. 07/07/16 08/06/16  Minus Liberty, MD  loratadine (CLARITIN) 10 MG tablet Take 1 tablet (10 mg total) by mouth daily. Patient not taking: Reported on 08/13/2016 07/03/16   Minus Liberty, MD  LORazepam (ATIVAN) 0.5 MG tablet 1 tablet po 30 minutes prior to radiation or MRI Patient not taking: Reported on 08/20/2016 06/21/16   Hayden Pedro, PA-C  montelukast (SINGULAIR) 10 MG tablet Take 1 tablet (10 mg total) by mouth at bedtime. Patient not taking: Reported on 08/20/2016 06/08/16   Barnet Glasgow, NP  temozolomide (TEMODAR) 140 MG capsule Take 1 capsule (140 mg total) by mouth daily. May take on an empty stomach or at bedtime to decrease nausea & vomiting. Patient not taking: Reported on 08/20/2016 06/27/16   Ladell Pier, MD    Family History Family History  Problem Relation Age of Onset  . Cancer Mother 47       Breast cancer  . Hypertension Mother   . Depression Mother   . Sickle cell trait Mother   . Cancer Father 4       lung cancer, was a long term smoker    Social History Social History  Substance Use Topics  .  Smoking status: Never Smoker  . Smokeless tobacco: Never Used  . Alcohol use No     Allergies   Patient has no known allergies.   Review of Systems Review of Systems  Constitutional: Positive for appetite change and fatigue. Negative for fever.  HENT: Negative for sore throat.   Eyes: Negative for visual disturbance.  Respiratory: Negative for cough and shortness of breath.   Cardiovascular: Negative for chest pain.  Gastrointestinal: Negative for abdominal pain, diarrhea, nausea and vomiting.  Genitourinary: Negative for difficulty urinating.  Musculoskeletal: Negative for back pain and neck pain.  Skin: Negative for rash.  Neurological: Negative for syncope, facial asymmetry, speech difficulty, numbness and headaches. Weakness: generalized.     Physical Exam Updated Vital Signs BP (!) 91/57 (BP Location: Left Arm)   Pulse (!) 133   Temp 98.7 F (37.1 C) (Oral)   Resp (!) 26   Ht 5' 5"  (  1.651 m)   Wt 64.4 kg (142 lb)   LMP 10/31/2005   SpO2 96%   BMI 23.63 kg/m   Physical Exam  Constitutional: She appears well-developed and well-nourished. She appears listless. She appears ill. No distress.  pale  HENT:  Head: Normocephalic and atraumatic.  Eyes: Conjunctivae and EOM are normal.  Neck: Normal range of motion.  Cardiovascular: Normal rate, regular rhythm, normal heart sounds and intact distal pulses.  Exam reveals no gallop and no friction rub.   No murmur heard. Pulmonary/Chest: Effort normal and breath sounds normal. No respiratory distress. She has no wheezes. She has no rales.  Abdominal: Soft. She exhibits no distension. There is no tenderness. There is no guarding.  Musculoskeletal: She exhibits no edema or tenderness.  Neurological: She has normal strength. She appears listless. No cranial nerve deficit or sensory deficit. GCS eye subscore is 4. GCS verbal subscore is 5. GCS motor subscore is 6.  Mental status at baseline per mom Patient answers some  questions appropriately, sometimes will answer yes to questions inappropriately (asking who is with her and patient just replies yes)  Skin: Skin is warm and dry. No rash noted. She is not diaphoretic. No erythema. There is pallor.  Nursing note and vitals reviewed.    ED Treatments / Results  Labs (all labs ordered are listed, but only abnormal results are displayed) Labs Reviewed  COMPREHENSIVE METABOLIC PANEL - Abnormal; Notable for the following:       Result Value   Sodium 133 (*)    Potassium 3.2 (*)    Chloride 97 (*)    Glucose, Bld 198 (*)    BUN 30 (*)    Calcium 8.6 (*)    Total Protein 5.9 (*)    Albumin 2.7 (*)    Total Bilirubin 2.8 (*)    All other components within normal limits  CBC WITH DIFFERENTIAL/PLATELET - Abnormal; Notable for the following:    WBC 3.0 (*)    RBC 1.68 (*)    Hemoglobin 4.9 (*)    HCT 14.5 (*)    RDW 16.6 (*)    All other components within normal limits  URINALYSIS, ROUTINE W REFLEX MICROSCOPIC - Abnormal; Notable for the following:    APPearance HAZY (*)    Glucose, UA 50 (*)    Leukocytes, UA MODERATE (*)    Bacteria, UA RARE (*)    Squamous Epithelial / LPF 0-5 (*)    All other components within normal limits  I-STAT CG4 LACTIC ACID, ED - Abnormal; Notable for the following:    Lactic Acid, Venous 2.24 (*)    All other components within normal limits  CULTURE, BLOOD (ROUTINE X 2)  CULTURE, BLOOD (ROUTINE X 2)  URINE CULTURE  I-STAT CHEM 8, ED  TYPE AND SCREEN  PREPARE RBC (CROSSMATCH)    EKG  EKG Interpretation  Date/Time:  Sunday August 20 2016 08:29:42 EDT Ventricular Rate:  135 PR Interval:    QRS Duration: 86 QT Interval:  293 QTC Calculation: 440 R Axis:   -95 Text Interpretation:  Sinus tachycardia LAD, consider left anterior fascicular block Abnormal R-wave progression, late transition ST elev, probable normal early repol pattern No previous ECGs available Confirmed by Gareth Morgan [phone]) on 08/20/2016 9:15:08  AM       Radiology Dg Chest Port 1 View  Result Date: 08/20/2016 CLINICAL DATA:  Generalized weakness beginning yesterday which shortness-of-breath. EXAM: PORTABLE CHEST 1 VIEW COMPARISON:  Chest CT 06/20/2016 FINDINGS: Lungs are  hypoinflated with minimal linear density over the lateral left base likely atelectasis. No evidence of effusion or pneumothorax. Cardiomediastinal silhouette is within normal. Mild degenerate change of the spine. IMPRESSION: Minimal linear atelectasis left base. Electronically Signed   By: Marin Olp M.D.   On: 08/20/2016 08:54    Procedures Procedures (including critical care time)  Medications Ordered in ED Medications  0.9 %  sodium chloride infusion (not administered)  sodium chloride 0.9 % bolus 1,000 mL (1,000 mLs Intravenous New Bag/Given 08/20/16 0836)    And  sodium chloride 0.9 % bolus 1,000 mL (1,000 mLs Intravenous New Bag/Given 08/20/16 0835)  piperacillin-tazobactam (ZOSYN) IVPB 3.375 g (0 g Intravenous Stopped 08/20/16 0915)  vancomycin (VANCOCIN) IVPB 1000 mg/200 mL premix (1,000 mg Intravenous New Bag/Given 08/20/16 0840)  CRITICAL CARE: sepsis/hypotension, severe anemia Performed by: Alvino Chapel   Total critical care time:45 minutes  Critical care time was exclusive of separately billable procedures and treating other patients.  Critical care was necessary to treat or prevent imminent or life-threatening deterioration.  Critical care was time spent personally by me on the following activities: development of treatment plan with patient and/or surrogate as well as nursing, discussions with consultants, evaluation of patient's response to treatment, examination of patient, obtaining history from patient or surrogate, ordering and performing treatments and interventions, ordering and review of laboratory studies, ordering and review of radiographic studies, pulse oximetry and re-evaluation of patient's condition.    Initial  Impression / Assessment and Plan / ED Course  I have reviewed the triage vital signs and the nursing notes.  Pertinent labs & imaging results that were available during my care of the patient were reviewed by me and considered in my medical decision making (see chart for details).     51 year old female with history of high-grade glioma, on radiation and temozolomide, diabetes, presents with concern for generalized weakness. Daughter reports that patient's baseline blood pressures are 878 systolic. She is noted to have some blood pressures in the 90s on most recent emergency department visits.  On arrival to the emergency department, she is tachycardic to the 130s, with blood pressures of 70 systolic. While her hypotension and tachycardia by history may be secondary to dehydration, consider other infectious etiology patient's generalized weakness and decreased appetite, and given significant low blood pressures will cover for possible sepsis of unknown source with vancomycin and Zosyn.  Ordered 30cc/kg NS. Lactic acid 2.24. Rectal temp 100.2. XR without pneumonia. Urinalysis with mod leukocytes, culture pending.  Labs returned showing mild hypokalemia, normal creatinine, however significant for hemoglobin of 4.9. Patient noted to have a hemoglobin of 11 on 7/11, with decrease on the 17th to 8.9. Patient pale, generally weak, feel hemoglobin of 4.9 likely accurate, however we'll send i-STAT Chem-8 for confirmation. Ordered transfusion of 2 units of PRBCs and consented patient and mother.  Patient and mom again deny any sources of bleeding, including no black or bloody stools, no hematemesis, no vaginal bleeding or hematuria.  Feel severe anemia (as well as pancytopenia) is likely secondary to temozolomide.   Blood pressures improved with fluids. Will consult hospitalist for admission for anemia, dehydration, r/o sepsis.  Final Clinical Impressions(s) / ED Diagnoses   Final diagnoses:  Generalized  weakness  Anemia, unspecified type  Hypotension, unspecified hypotension type    New Prescriptions New Prescriptions   No medications on file     Gareth Morgan, MD 08/20/16 2321

## 2016-08-20 NOTE — ED Notes (Signed)
Bed: BA25 Expected date:  Expected time:  Means of arrival:  Comments: 51 yo weakness

## 2016-08-20 NOTE — ED Triage Notes (Signed)
Per EMS, patient c/o generalized weakness that started yesterday. When she got up this morning she had a hard time. Patient had low sats initially in the 70s and was on a non rebreather on arrival. Patient had no other complains, no breathing difficulty and came up to 97% on room air. Pt HR 130's-140's, unable to start a line. Per patients mother she had low fluid intake last couple days. Hx of brain cancer. Oral chemo.

## 2016-08-20 NOTE — ED Notes (Signed)
edp schlossman informed of istat lactic result

## 2016-08-20 NOTE — H&P (Signed)
History and Physical    Jaime [name]:[id number] DOB: 1965/03/05 DOA: 08/20/2016  Referring MD/NP/PA:  PCP: Katherine Roan, MD  Outpatient Specialists: Chales Salmon,  oncology Patient coming from: Home  Chief Complaint: Generalized weakness  HPI: Jaime [name] is a 51 y.o. 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 No history of cough or sputum production. No history of chest pain. She had been so weak that requires assistance with ADLs at home. Lastly she has been lethargic with decreased responsiveness, and has had poor oral intake. No history of fever or chills.   ED Course: At the ED patient was hypotensive with systolic blood pressures in the 70s, tachycardic, with elevated lactic acid level. Hemoglobin was 4.9. She was given IV fluid boluses and 2 units packed red blood cell transfusion initiated thereafter  Review of Systems: As per HPI otherwise 10 point review of systems negative.   Past Medical History:  Diagnosis Date  . Candida, oral 06/22/2016   Due to steroids 06/22/16  . Diabetes mellitus   . DUB (dysfunctional uterine bleeding)    total abdominal hysterectomy in 2007, ovaries intact  . Furuncle of multiple sites    multiple I and D's  . GERD (gastroesophageal reflux disease)   . Hydradenitis   . Intellectual disability   . 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 ABDOMINAL HYSTERECTOMY    . HYDRADENITIS EXCISION    . TONSILLECTOMY AND ADENOIDECTOMY     in early childhood     reports that she has never smoked. She has never used smokeless tobacco. She reports that she does not drink alcohol or use drugs.  No Known Allergies  Family History  Problem Relation Age of Onset  . Cancer  Mother 60       Breast cancer  . Hypertension Mother   . Depression Mother   . Sickle cell trait Mother   . Cancer Father 23       lung cancer, was a long term smoker     Prior to Admission medications   Medication Sig Start Date End Date Taking? Authorizing Provider  ACCU-CHEK AVIVA PLUS test strip USE TO TEST BLOOD SUGAR FOUR TIMES DAILY 07/11/16  Yes Minus Liberty, MD  ACCU-CHEK SOFTCLIX LANCETS lancets USE TO CHECK BLOOD SUGAR FOUR TIMES DAILY 07/11/16  Yes Minus Liberty, MD  acetaminophen (TYLENOL) 500 MG tablet Take 500 mg by mouth every 6 (six) hours as needed for moderate pain.    Yes [provider]  aspirin EC 81 MG tablet Take 81 mg by mouth daily.   Yes [provider]  blood glucose meter kit and supplies Dispense based on patient and insurance preference. Use up to four times daily as directed. (FOR ICD-9 250.00, 250.01). 06/21/16  Yes Holley Raring, MD  dexamethasone (DECADRON) 4 MG tablet Take 1 tablet (4 mg total) by mouth 2 (two) times daily with a meal. Patient taking differently: Take 2 mg by mouth daily.  07/24/16  Yes Ladell Pier, MD  emollient (BIAFINE) cream Apply 1 application topically daily. 07/03/16  Yes [provider]  famotidine (PEPCID) 20 MG tablet Take 1 tablet (20 mg total) by mouth 2 (two) times daily. 07/03/16  Yes Minus Liberty, MD  insulin aspart (NOVOLOG FLEXPEN) 100 UNIT/ML  FlexPen Inject 5 Units into the skin 3 (three) times daily with meals. Patient taking differently: Inject 3-5 Units into the skin 3 (three) times daily with meals.  06/21/16  Yes Holley Raring, MD  insulin glargine (LANTUS) 100 unit/mL SOPN Inject 0.17 mLs (17 Units total) into the skin at bedtime. 06/28/16  Yes Alphonzo Grieve, MD  Insulin Pen Needle 31G X 5 MM MISC Use for injections under the skin as directed. 06/21/16  Yes Holley Raring, MD  levETIRAcetam (KEPPRA) 500 MG tablet TAKE 1 TABLET BY MOUTH TWICE DAILY 07/17/16  Yes Minus Liberty, MD  metFORMIN (GLUCOPHAGE) 1000 MG tablet Take 1 tablet (1,000 mg total) by mouth 2 (two) times daily with a meal. 06/21/16 09/19/16 Yes Holley Raring, MD  Nutritional Supplements (FEEDING SUPPLEMENT, BOOST BREEZE,) LIQD Take 1 Bottle by mouth 3 (three) times daily.  08/17/16  Yes [provider]  ondansetron (ZOFRAN ODT) 4 MG disintegrating tablet Take 1 tablet (4 mg total) by mouth every 8 (eight) hours as needed for nausea or vomiting. 07/24/16  Yes Ladell Pier, MD  valACYclovir (VALTREX) 1000 MG tablet Take 1,000 mg by mouth daily.  07/11/16  Yes [provider]  fluticasone (FLONASE) 50 MCG/ACT nasal spray Place 2 sprays into both nostrils daily. Patient not taking: Reported on 08/13/2016 06/08/16   Barnet Glasgow, NP  lisinopril (PRINIVIL,ZESTRIL) 40 MG tablet Take 1 tablet (40 mg total) by mouth daily. 07/07/16 08/06/16  Minus Liberty, MD  loratadine (CLARITIN) 10 MG tablet Take 1 tablet (10 mg total) by mouth daily. Patient not taking: Reported on 08/13/2016 07/03/16   Minus Liberty, MD  LORazepam (ATIVAN) 0.5 MG tablet 1 tablet po 30 minutes prior to radiation or MRI Patient not taking: Reported on 08/20/2016 06/21/16   Hayden Pedro, PA-C  montelukast (SINGULAIR) 10 MG tablet Take 1 tablet (10 mg total) by mouth at bedtime. Patient not taking: Reported on 08/20/2016 06/08/16   Barnet Glasgow, NP  temozolomide (TEMODAR) 140 MG capsule Take 1 capsule (140 mg total) by mouth daily. May take on an empty stomach or at bedtime to decrease nausea & vomiting. Patient not taking: Reported on 08/20/2016 06/27/16   Ladell Pier, MD    Physical Exam: Vitals:   08/20/16 1135 08/20/16 1136 08/20/16 1140 08/20/16 1145  BP: (!) 88/57  (!) 94/59 98/63  Pulse: (!) 128  (!) 135 (!) 134  Resp: 20  (!) 21 20  Temp:  99 F (37.2 C)    TempSrc:  Oral    SpO2: 100%  100% 100%  Weight:      Height:          Constitutional: NAD, calm, comfortable Vitals:    08/20/16 1135 08/20/16 1136 08/20/16 1140 08/20/16 1145  BP: (!) 88/57  (!) 94/59 98/63  Pulse: (!) 128  (!) 135 (!) 134  Resp: 20  (!) 21 20  Temp:  99 F (37.2 C)    TempSrc:  Oral    SpO2: 100%  100% 100%  Weight:      Height:       Eyes: PERRL, (+) Scleral pallor ENMT: Dry mucous membranes. Posterior pharynx clear of any exudate or lesions.Normal dentition.  Neck: normal, supple, no masses, no thyromegaly Respiratory: clear to auscultation bilaterally, no wheezing, no crackles. Normal respiratory effort. No accessory muscle use.  Cardiovascular: Regular rate and rhythm, no murmurs / rubs / gallops. No extremity edema. 2+ pedal pulses. No carotid bruits.  Abdomen: no tenderness, no masses  palpated. No hepatosplenomegaly. Bowel sounds positive.  Musculoskeletal: no clubbing / cyanosis. No joint deformity upper and lower extremities. Good ROM, no contractures. Normal muscle tone.  Skin: no rashes, lesions, ulcers. No induration Neurologic: Lethargic but easily arousable, follows commands and moves all her extremities without any obvious focal deficits      Labs on Admission: I have personally reviewed following labs and imaging studies  CBC:  Recent Labs Lab 08/13/16 1705 08/17/16 1331 08/20/16 0828  WBC 4.8 4.1 3.0*  NEUTROABS 4.2 2.8 1.8  HGB 8.4* 8.9* 4.9*  HCT 24.1* 27.3* 14.5*  MCV 84.3 88.1 86.3  PLT 91* 86* 220*   Basic Metabolic Panel:  Recent Labs Lab 08/13/16 1705 08/20/16 0828  NA 136 133*  K 3.8 3.2*  CL 98* 97*  CO2 31 27  GLUCOSE 86 198*  BUN 19 30*  CREATININE 0.94 0.60  CALCIUM 8.5* 8.6*   GFR: Estimated Creatinine Clearance: 76.5 mL/min (by C-G formula based on SCr of 0.6 mg/dL). Liver Function Tests:  Recent Labs Lab 08/13/16 1705 08/20/16 0828  AST 20 31  ALT 15 23  ALKPHOS 42 53  BILITOT 0.3 2.8*  PROT 5.9* 5.9*  ALBUMIN 3.0* 2.7*   No results for input(s): LIPASE, AMYLASE in the last 168 hours. No results for input(s):  AMMONIA in the last 168 hours. Coagulation Profile: No results for input(s): INR, PROTIME in the last 168 hours. Cardiac Enzymes: No results for input(s): CKTOTAL, CKMB, CKMBINDEX, TROPONINI in the last 168 hours. BNP (last 3 results) No results for input(s): PROBNP in the last 8760 hours. HbA1C: No results for input(s): HGBA1C in the last 72 hours. CBG: No results for input(s): GLUCAP in the last 168 hours. Lipid Profile: No results for input(s): CHOL, HDL, LDLCALC, TRIG, CHOLHDL, LDLDIRECT in the last 72 hours. Thyroid Function Tests: No results for input(s): TSH, T4TOTAL, FREET4, T3FREE, THYROIDAB in the last 72 hours. Anemia Panel: No results for input(s): VITAMINB12, FOLATE, FERRITIN, TIBC, IRON, RETICCTPCT in the last 72 hours. Urine analysis:    Component Value Date/Time   COLORURINE YELLOW 08/20/2016 0827   APPEARANCEUR HAZY (A) 08/20/2016 0827   LABSPEC 1.010 08/20/2016 0827   LABSPEC 1.010 08/09/2016 1502   PHURINE 5.0 08/20/2016 0827   GLUCOSEU 50 (A) 08/20/2016 0827   GLUCOSEU Negative 08/09/2016 1502   HGBUR NEGATIVE 08/20/2016 0827   BILIRUBINUR NEGATIVE 08/20/2016 0827   BILIRUBINUR Negative 08/09/2016 1502   KETONESUR NEGATIVE 08/20/2016 0827   PROTEINUR NEGATIVE 08/20/2016 0827   UROBILINOGEN 0.2 08/09/2016 1502   NITRITE NEGATIVE 08/20/2016 0827   LEUKOCYTESUR MODERATE (A) 08/20/2016 0827   LEUKOCYTESUR Negative 08/09/2016 1502   Sepsis Labs: @LABRCNTIP (procalcitonin:4,lacticidven:4) )No results found for this or any previous visit (from the past 240 hour(s)).   Radiological Exams on Admission: Dg Chest Port 1 View  Result Date: 08/20/2016 CLINICAL DATA:  Generalized weakness beginning yesterday which shortness-of-breath. EXAM: PORTABLE CHEST 1 VIEW COMPARISON:  Chest CT 06/20/2016 FINDINGS: Lungs are hypoinflated with minimal linear density over the lateral left base likely atelectasis. No evidence of effusion or pneumothorax. Cardiomediastinal  silhouette is within normal. Mild degenerate change of the spine. IMPRESSION: Minimal linear atelectasis left base. Electronically Signed   By: Marin Olp M.D.   On: 08/20/2016 08:54    EKG: Independently reviewed.   Assessment/Plan Principal Problem:   Symptomatic anemia Active Problems:   Diabetes mellitus type 2 with retinopathy (HCC)   Glioblastoma multiforme of temporal lobe (HCC)   Dehydration   Acute prerenal  azotemia   SIRS (systemic inflammatory response syndrome) (HCC)   #1 Critical Symptomatic Anemia: Hx of Fe def anemia Hx of maliganacy s/p completion of Chemo and RT 2 weeks ago No evidence of acute Hemorrhage PRBC transfusion Check FOBT Follow H/H  #2 SIRS/? Sepsis: Source unclear Pancultures - bld, urine and sputum Abx coverage  #3 Dehydration: Due to poor oral intake, likely related to malignancy IV rehydration with monitoring of electrolytes and renal function  #4 AKI/Pre-renal Azotemia: Due to diagnoss #3 Supportive care  #5 Pancytopenia: Likely related to [place] with recent Chemo Follow clinically  #6 DM2: Hold all meds for now SSI for now Resume home meds as her oral intake improves  #7 Protein Cal Malnutrition: Nutrition consult    DVT prophylaxis:  (SCD's Code Status:  (DNR) Family Communication: Mum, Pasty Spillers, at bedside Disposition Plan: (To be determined) Consults called:  Admission status: (inpatient / SDU)   OSEI-BONSU,Shaquila Sigman MD [facility] Pager [phone]  If 7PM-7AM, please contact night-coverage [email or web address] Password [facility]  08/20/2016, 11:50 AM

## 2016-08-21 ENCOUNTER — Ambulatory Visit: Payer: Medicaid Other | Admitting: Oncology

## 2016-08-21 DIAGNOSIS — I959 Hypotension, unspecified: Secondary | ICD-10-CM

## 2016-08-21 DIAGNOSIS — E43 Unspecified severe protein-calorie malnutrition: Secondary | ICD-10-CM | POA: Insufficient documentation

## 2016-08-21 DIAGNOSIS — D649 Anemia, unspecified: Secondary | ICD-10-CM

## 2016-08-21 DIAGNOSIS — R531 Weakness: Secondary | ICD-10-CM

## 2016-08-21 DIAGNOSIS — D573 Sickle-cell trait: Secondary | ICD-10-CM

## 2016-08-21 LAB — URINE CULTURE

## 2016-08-21 LAB — RETICULOCYTES
RBC.: 2.41 MIL/uL — AB (ref 3.87–5.11)
RETIC COUNT ABSOLUTE: 43.4 10*3/uL (ref 19.0–186.0)
RETIC CT PCT: 1.8 % (ref 0.4–3.1)

## 2016-08-21 LAB — GLUCOSE, CAPILLARY
GLUCOSE-CAPILLARY: 180 mg/dL — AB (ref 65–99)
GLUCOSE-CAPILLARY: 275 mg/dL — AB (ref 65–99)
GLUCOSE-CAPILLARY: 286 mg/dL — AB (ref 65–99)
Glucose-Capillary: 343 mg/dL — ABNORMAL HIGH (ref 65–99)

## 2016-08-21 LAB — BASIC METABOLIC PANEL
ANION GAP: 6 (ref 5–15)
BUN: 18 mg/dL (ref 6–20)
CALCIUM: 8.8 mg/dL — AB (ref 8.9–10.3)
CO2: 28 mmol/L (ref 22–32)
Chloride: 105 mmol/L (ref 101–111)
Creatinine, Ser: 0.35 mg/dL — ABNORMAL LOW (ref 0.44–1.00)
GLUCOSE: 227 mg/dL — AB (ref 65–99)
POTASSIUM: 3.1 mmol/L — AB (ref 3.5–5.1)
Sodium: 139 mmol/L (ref 135–145)

## 2016-08-21 LAB — CBC
HCT: 22 % — ABNORMAL LOW (ref 36.0–46.0)
Hemoglobin: 7.7 g/dL — ABNORMAL LOW (ref 12.0–15.0)
MCH: 28.7 pg (ref 26.0–34.0)
MCHC: 35 g/dL (ref 30.0–36.0)
MCV: 82.1 fL (ref 78.0–100.0)
PLATELETS: 100 10*3/uL — AB (ref 150–400)
RBC: 2.68 MIL/uL — AB (ref 3.87–5.11)
RDW: 17.4 % — AB (ref 11.5–15.5)
WBC: 3.2 10*3/uL — AB (ref 4.0–10.5)

## 2016-08-21 LAB — BILIRUBIN, FRACTIONATED(TOT/DIR/INDIR)
BILIRUBIN INDIRECT: 1.4 mg/dL — AB (ref 0.3–0.9)
Bilirubin, Direct: 0.3 mg/dL (ref 0.1–0.5)
Total Bilirubin: 1.7 mg/dL — ABNORMAL HIGH (ref 0.3–1.2)

## 2016-08-21 LAB — MRSA PCR SCREENING: MRSA by PCR: POSITIVE — AB

## 2016-08-21 LAB — TROPONIN I: Troponin I: <0.03 ng/mL (ref <0.03)

## 2016-08-21 LAB — HEMOGLOBIN A1C
Hgb A1c MFr Bld: 6.4 % — ABNORMAL HIGH (ref 4.8–5.6)
MEAN PLASMA GLUCOSE: 137 mg/dL

## 2016-08-21 LAB — LACTATE DEHYDROGENASE: LDH: 398 U/L — AB (ref 98–192)

## 2016-08-21 MED ORDER — BOOST / RESOURCE BREEZE PO LIQD
1.0000 | Freq: Two times a day (BID) | ORAL | Status: DC
  Administered 2016-08-22 – 2016-08-26 (×9): 1 via ORAL

## 2016-08-21 MED ORDER — ADULT MULTIVITAMIN W/MINERALS CH
1.0000 | ORAL_TABLET | Freq: Every day | ORAL | Status: DC
  Administered 2016-08-21 – 2016-08-26 (×6): 1 via ORAL
  Filled 2016-08-21 (×6): qty 1

## 2016-08-21 MED ORDER — PREMIER PROTEIN SHAKE
11.0000 [oz_av] | Freq: Two times a day (BID) | ORAL | Status: DC
  Administered 2016-08-21 – 2016-08-26 (×10): 11 [oz_av] via ORAL
  Filled 2016-08-21 (×13): qty 325.31

## 2016-08-21 MED ORDER — POTASSIUM CHLORIDE CRYS ER 20 MEQ PO TBCR
40.0000 meq | EXTENDED_RELEASE_TABLET | Freq: Once | ORAL | Status: AC
  Administered 2016-08-21: 40 meq via ORAL
  Filled 2016-08-21: qty 2

## 2016-08-21 NOTE — Progress Notes (Signed)
Initial Nutrition Assessment  DOCUMENTATION CODES:   Severe malnutrition in context of acute illness/injury  INTERVENTION:  - Will decrease Boost Breeze po TID, each supplement provides 250 kcal and 9 grams of protein - Will order Premier Protein BID, each supplement provides 160 kcal, 5 grams of carbohydrate, and 30 grams of protein.  - Will order daily multivitamin with minerals. - Continue to encourage PO intakes of meals and supplements.   NUTRITION DIAGNOSIS:   Malnutrition (severe) related to acute illness, chronic illness, catabolic illness, cancer and cancer related treatments as evidenced by energy intake < or equal to 50% for > or equal to 5 days, percent weight loss.  GOAL:   Patient will meet greater than or equal to 90% of their needs  MONITOR:   PO intake, Supplement acceptance, Weight trends, Labs, I & O's  REASON FOR ASSESSMENT:   Malnutrition Screening Tool, Consult Assessment of nutrition requirement/status  ASSESSMENT:   51 y.o. 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 No history of cough or sputum production. No history of chest pain. She had been so weak that requires assistance with ADLs at home. Lastly she has been lethargic with decreased responsiveness, and has had poor oral intake.  Pt seen for MST and consult. BMI indicates normal weight. Per chart review, pt consumed 50% of breakfast (~285 kcal and 7 grams of protein) and 50% of lunch (~210 kcal and 7 grams of protein) today. Pt very sleepy at time of RD visit and requests that all questions be directed to her mom, who is at bedside. An aunt is also at bedside and pt states it is okay to talk in front of her and receive information from aunt.   Family reports that pt began radiation and oral chemo on 07/03/16 and  that both treatments finished on 07/31/16. During the time frame of treatment pt was experiencing N/V that happened randomly several times per day. She was also having taste alteration (metallic taste) which led to lack of interest in even favorite foods (examples of shrimp and sushi given). Since the conclusion of treatment, N/V has persisted and pt has mainly been consuming liquids at home: [employer], [employer], and flavored water for the past 1-2 weeks. Metallic taste has improved at times but at other times continues to affect pt's desire to consume PO.   Physical assessment deferred at this time, per request. Per chart review, pt has lost 13 lbs (8.4% body weight) in the past 1 month; this is significant for time frame. MD notes indicate dx of dehydration with need for high rate IVF. Unsure of amount of weight loss attributable to dehydration versus muscle and fat wasting. Pt meets criteria for malnutrition based on weight loss and <50% need intakes for >/= 5 days.   Medications reviewed; 20 mg oral Pepcid BID, sliding scale Novolog, 17 units Lantus/day, 40 mEq oral KCl x1 dose today. Labs reviewed; CBGs: 180 and 275 mg/dL today, K: 3.1 mmol/L, creatinine: 0.35 mg/dL, Ca: 8.8 mg/dL.  IVF: NS @ 125 mL/hr now decreased to 75 mL/hr.     Diet Order:  Diet heart healthy/carb modified Room service appropriate? Yes; Fluid consistency: Thin  Skin:  Reviewed, no issues (Non-pressure injury to R buttocks)  Last BM:  7/20 (PTA)  Height:   Ht Readings from Last 1 Encounters:  08/20/16 5'  5" (1.651 m)    Weight:   Wt Readings from Last 1 Encounters:  08/20/16 142 lb (64.4 kg)    Ideal Body Weight:  56.82 kg  BMI:  Body mass index is 23.63 kg/m.  Estimated Nutritional Needs:   Kcal:  1935-2125 (30-33 kcal/kg)  Protein:  85-95 grams  Fluid:  >/= 2.1 L/day  EDUCATION NEEDS:   No education needs identified at this time    Jarome Matin, MS, RD, LDN, CNSC Inpatient  Clinical Dietitian Pager # [phone] After hours/weekend pager # [phone]

## 2016-08-21 NOTE — Progress Notes (Signed)
Pharmacy Antibiotic Note  Jaime [name] is a 51 y.o. female admitted on 08/20/2016 with sepsis.  Pharmacy has been consulted for Vanc/Zosyn dosing on 7/22.  Plan: 1) Continue vancomycin 750mg  IV q12 2) Continue Zosyn 3.375g IV q8 (extended interval infusion) 3) Daily SCr 4) Recommend ordering MRSA PCR so can d/c vanc if it is negative  Height: 5\' 5"  (165.1 cm) Weight: 142 lb (64.4 kg) IBW/kg (Calculated) : 57  Temp (24hrs), Avg:99.1 F (37.3 C), Min:98.6 F (37 C), Max:100.2 F (37.9 C)   Recent Labs Lab 08/17/16 1331 08/20/16 0828 08/20/16 0840 08/20/16 1144 08/21/16 0535  WBC 4.1 3.0*  --   --  3.2*  CREATININE  --  0.60  --   --  0.35*  LATICACIDVEN  --   --  2.24* 1.67  --     Estimated Creatinine Clearance: 76.5 mL/min (A) (by C-G formula based on SCr of 0.35 mg/dL (L)).    No Known Allergies    Thank you for allowing pharmacy to be a part of this patient's care.   Adrian Saran, PharmD, BCPS Pager [phone] 08/21/2016 9:40 AM

## 2016-08-21 NOTE — Progress Notes (Signed)
IP PROGRESS NOTE  Subjective:   Jaime [name] was seen in the emergency room 08/13/2016 with nausea and vomiting. She was treated with intravenous fluids and discharged home. She returned to the emergency room yesterday with profound weakness and was noted to have severe anemia and hypotension. Her mother reports she became very weak over the weekend and was unable to ambulate. No further nausea or vomiting. No bleeding.  On admission the hemoglobin returned at 4.9, platelets 106,000, white count 3.0 with absolute neutrophil count of 1.8. She was transfused 2 units of packed blood cells on 08/20/2016.  She reports feeling better following the red cell transfusion. No nausea at present.  Her mother reports that Jaime [name] has been more confused.  The restaging brain MRI on 08/11/2016 revealed a slight increase in size of the enhancing left medial temporal mass with decreased edema/mass effect compared to the MRI 06/18/2016.    Objective: Vital signs in last 24 hours: Blood pressure 103/60, pulse (!) 107, temperature (!) 97.4 F (36.3 C), temperature source Oral, resp. rate 16, height 5\' 5"  (1.651 m), weight 142 lb (64.4 kg), last menstrual period 10/31/2005, SpO2 99 %.  Intake/Output from previous day: 07/22 0701 - 07/23 0700 In: 3516.9 [P.O.:240; I.V.:2229.2; Blood:697.8; IV Piggyback:350] Out: 1800 [Urine:1800]  Physical Exam:  HEENT: Mild scleral icterus, no thrush Lungs: Clear bilaterally Cardiac: Regular rate and rhythm Abdomen: No hepatomegaly, no mass, nontender Extremities: No leg edema Neurologic: Alert, oriented to date, not oriented to month or diagnosis   Lab Results:  Recent Labs  08/20/16 0828 08/21/16 0535  WBC 3.0* 3.2*  HGB 4.9* 7.7*  HCT 14.5* 22.0*  PLT 106* 100*    BMET  Recent Labs  08/20/16 0828 08/21/16 0535  NA 133* 139  K 3.2* 3.1*  CL 97* 105  CO2 27 28  GLUCOSE 198* 227*  BUN 30* 18  CREATININE 0.60 0.35*  CALCIUM 8.6* 8.8*   08/20/2016: AST 31, bilirubin 2.8  Studies/Results: Dg Chest Port 1 View  Result Date: 08/20/2016 CLINICAL DATA:  Generalized weakness beginning yesterday which shortness-of-breath. EXAM: PORTABLE CHEST 1 VIEW COMPARISON:  Chest CT 06/20/2016 FINDINGS: Lungs are hypoinflated with minimal linear density over the lateral left base likely atelectasis. No evidence of effusion or pneumothorax. Cardiomediastinal silhouette is within normal. Mild degenerate change of the spine. IMPRESSION: Minimal linear atelectasis left base. Electronically Signed   By: Marin Olp M.D.   On: 08/20/2016 08:54    Medications: I have reviewed the patient's current medications.  Assessment/Plan: 1. History of iron deficiency anemia. Mild normocytic anemia during hospital admission May 2018 2. Sickle cell trait. 3. Status post hysterectomy. 4. History of dysphagia. She underwent an endoscopy by Dr. Collene Mares.  5. Diabetes 6. 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 History of Nausea/vomiting and headaches secondary to #6 8. Dry skin rash both hands. 9. Viral appearing lesions right medial buttock. Status post 7 day course of Valtrex-healed 10. Markedly elevated BUN/creatinine-likely related to dehydration versus GI bleeding 11. Severe anemia, mild thrombocytopenia-potentially related to temozolomide/radiation, primary bone marrow process, or the anemia could be secondary to hemolysis or bleeding.   She is admitted yesterday with severe symptomatic anemia. The etiology of  the anemia is unclear. A stool Hemoccult was negative and there is  no report of bleeding. However she does have intermittent nausea/vomiting that has been attributed to the brain tumor.  There is evidence of mild progression of the brain mass on the restaging MRI. We will consider additional systemic treatment options to include monthly temozolomide and the addition of Avastin.  We need to further evaluate the anemia prior to deciding on treatment of the brain tumor. The tumor has not been biopsied and it is possible the mass is related to her GI symptoms and anemia.  Recommendations:  1. Additional evaluation of the anemia-hemolysis workup, peripheral blood smear review 2. Consider GI evaluation-abdomen/pelvic CT for persistent constipation or recurrent nausea/vomiting 3. Continue Decadron and an antacid therapy  I appreciate the care from Dr. Doyle Askew. I will continue following Jaime [name] while she is in the hospital.    LOS: 1 day   Donneta Romberg, MD   08/21/2016, 4:50 PM

## 2016-08-21 NOTE — Progress Notes (Signed)
Inpatient Diabetes Program Recommendations  AACE/ADA: New Consensus Statement on Inpatient Glycemic Control (2015)  Target Ranges:  Prepandial:   less than 140 mg/dL      Peak postprandial:   less than 180 mg/dL (1-2 hours)      Critically ill patients:  140 - 180 mg/dL   Lab Results  Component Value Date   GLUCAP 275 (H) 08/21/2016   HGBA1C 6.4 (H) 08/20/2016    Review of Glycemic Control  Diabetes history: DM2 Outpatient Diabetes medications: Lantus 17 units QHS, Novolog 5 units tidwc, metformin 1000 mg bid Current orders for Inpatient glycemic control: Lantus 17 units QHS, Novolog 0-15 units tidwc and hs  On Decadron 4 mg bid. Appears to have good glycemic control at home as per HgbA1C of 6.4%.  Inpatient Diabetes Program Recommendations:    Add Novolog 3 units tidwc for meal coverage insulin if pt eats > 50% meal.  Will follow.  Thank you. Lorenda Peck, RD, LDN, CDE Inpatient Diabetes Coordinator [phone]

## 2016-08-21 NOTE — Care Management Note (Signed)
Case Management Note  Patient Details  Name: Jaime [name] MRN: [id number] Date of Birth: Jul 06, 1965  Subjective/Objective:                  Hypotension, sepsis,anemia  Action/Plan: Date:  August 21, 2016 Chart reviewed for concurrent status and case management needs. Will continue to follow patient progress. Discharge Planning: following for needs Expected discharge date: [id number] Velva Harman, BSN, [place], [place]  Expected Discharge Date:                  Expected Discharge Plan:  Home/Self Care  In-House Referral:  Clinical Social Work  Discharge planning Services  CM Consult  Post Acute Care Choice:    Choice offered to:     DME Arranged:    DME Agency:     HH Arranged:    [place] Agency:     Status of Service:  In process, will continue to follow  If discussed at Long Length of Stay Meetings, dates discussed:    Additional Comments:  Leeroy Cha, RN 08/21/2016, 8:50 AM

## 2016-08-21 NOTE — Progress Notes (Signed)
Patient ID: Jaime [name], female   DOB: [birth year], 51 y.o.   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   Assessment & Plan:   Principal Problem:   Symptomatic anemia, pancytopenia  - Hg up post transfusion: 4.9 --> 7.7 - no evidence of active bleeding - WBC is slightly better this AM - Plt overall stable  - CBC in AM  Active Problems:   SIRS - unclear etiology - blood and urine cultures pending - on vanc and zosyn day #2  narrow down abx as clinically indicated    Diabetes mellitus type 2 with retinopathy ([place]) - keep on Lantus 17 U QHS, SSI    Glioblastoma multiforme of temporal lobe ([facility]) - mental status clear this AM - pt tolerating diet well - ok to transfer to tele - continue dexamethasone, keppra - PT eval     Dehydration, tachycardia - from poor oral intake  - improving with IVF    Hypokalemia - supplement and repeat BMP in AM    Hyponatremia  - pre renal in etiology - resolved with IVF   DVT prophylaxis: SCD's Code Status: DNR Family Communication: Patient and family at bedside  Disposition Plan: home in 2-3 days   Consultants:   None  Procedures:   None  Antimicrobials:   Vanc 7/22 -->  Zosyn 7/22 -->  Subjective: Pt reports feeling better.   Objective: Vitals:   08/21/16 0200 08/21/16 0305 08/21/16 0800 08/21/16 1200  BP: (!) 98/58 107/72  103/60  Pulse: 95 (!) 102  (!) 107  Resp: 17 19  16   Temp: 98.8 F (37.1 C)  (!) 97.5 F (36.4 C)   TempSrc: Oral Oral Oral   SpO2: 100% 100%  99%  Weight:      Height:        Intake/Output Summary (Last 24 hours) at 08/21/16 1321 Last data filed at 08/21/16 1240  Gross per 24 hour  Intake          3426.92 ml  Output              1825 ml  Net          1601.92 ml   Filed Weights   08/20/16 0803 08/20/16 0834  Weight: 65.8 kg (145 lb) 64.4 kg (142 lb)    Examination:  General exam: Appears calm and comfortable  Respiratory system: Clear to auscultation. Respiratory effort normal. Cardiovascular system: tachycardic, No JVD, murmurs, rubs, gallops or clicks. No pedal edema. Gastrointestinal system: Abdomen is nondistended, soft and nontender. No organomegaly or masses felt. Normal bowel sounds heard. Central nervous system: Alert and oriented. No focal neurological deficits. Extremities: Symmetric 5 x 5 power.  Data Reviewed: I have personally reviewed following labs and imaging studies  CBC:  Recent Labs Lab 08/17/16 1331 08/20/16 0828 08/21/16 0535  WBC 4.1 3.0* 3.2*  NEUTROABS 2.8 1.8  --   HGB 8.9* 4.9* 7.7*  HCT 27.3* 14.5* 22.0*  MCV 88.1 86.3 82.1  PLT 86* 106* 505*   Basic Metabolic Panel:  Recent Labs Lab 08/20/16 0828 08/21/16 0535  NA 133* 139  K 3.2* 3.1*  CL 97* 105  CO2 27 28  GLUCOSE 198* 227*  BUN 30* 18  CREATININE 0.60 0.35*  CALCIUM 8.6* 8.8*   Liver Function Tests:  Recent Labs Lab 08/20/16 0828  AST 31  ALT 23  ALKPHOS 53  BILITOT 2.8*  PROT 5.9*  ALBUMIN 2.7*   Cardiac Enzymes:  Recent Labs Lab 08/20/16 1545 08/21/16 0033 08/21/16 0535  TROPONINI <0.03 <0.03 <0.03   HbA1C:  Recent Labs  08/20/16 1545  HGBA1C 6.4*   CBG:  Recent Labs Lab 08/20/16 1626 08/20/16 2123 08/21/16 0734 08/21/16 1125  GLUCAP 159* 219* 180* 275*   Thyroid Function Tests:  Recent Labs  08/20/16 1545  TSH 1.021   Urine analysis:    Component Value Date/Time   COLORURINE YELLOW 08/20/2016 0827   APPEARANCEUR HAZY (A) 08/20/2016 0827   LABSPEC 1.010 08/20/2016 0827   LABSPEC 1.010 08/09/2016 1502   PHURINE 5.0 08/20/2016 0827   GLUCOSEU 50 (A) 08/20/2016 0827   GLUCOSEU Negative 08/09/2016 1502   HGBUR NEGATIVE 08/20/2016 0827   BILIRUBINUR NEGATIVE  08/20/2016 0827   BILIRUBINUR Negative 08/09/2016 1502   KETONESUR NEGATIVE 08/20/2016 0827   PROTEINUR NEGATIVE 08/20/2016 0827   UROBILINOGEN 0.2 08/09/2016 1502   NITRITE NEGATIVE 08/20/2016 0827   LEUKOCYTESUR MODERATE (A) 08/20/2016 0827   LEUKOCYTESUR Negative 08/09/2016 1502   Recent Results (from the past 240 hour(s))  Urine culture     Status: Abnormal   Collection Time: 08/20/16  8:27 AM  Result Value Ref Range Status   Specimen Description URINE, RANDOM  Final   Special Requests NONE  Final   Culture MULTIPLE SPECIES PRESENT, SUGGEST RECOLLECTION (A)  Final   Report Status 08/21/2016 FINAL  Final    Radiology Studies: Dg Chest Port 1 View  Result Date: 08/20/2016 CLINICAL DATA:  Generalized weakness beginning yesterday which shortness-of-breath. EXAM: PORTABLE CHEST 1 VIEW COMPARISON:  Chest CT 06/20/2016 FINDINGS: Lungs are hypoinflated with minimal linear density over the lateral left base likely atelectasis. No evidence of effusion or pneumothorax. Cardiomediastinal silhouette is within normal. Mild degenerate change of the spine. IMPRESSION: Minimal linear atelectasis left base. Electronically Signed   By: Marin Olp M.D.   On: 08/20/2016 08:54   Scheduled Meds: . dexamethasone  4 mg Oral BID WC  . emollient  1 application Topical Daily  . famotidine  20 mg Oral BID  . feeding supplement  1 Container Oral TID BM  . insulin aspart  0-15 Units Subcutaneous TID WC  . insulin aspart  0-5 Units Subcutaneous QHS  . insulin glargine  17 Units Subcutaneous QHS  . levETIRAcetam  500 mg Oral BID  . sodium chloride flush  3 mL Intravenous Q12H  . valACYclovir  1,000 mg Oral Daily   Continuous Infusions: . sodium chloride 125 mL/hr at 08/21/16 0511  . sodium chloride    . piperacillin-tazobactam (ZOSYN)  IV Stopped (08/21/16 0909)  . vancomycin 750 mg (08/21/16 1237)     LOS: 1 day   Time spent: 35 minutes   Faye Ramsay, MD [facility] Pager  [phone]  If 7PM-7AM, please contact night-coverage [email or web address] Password [email or web address] 08/21/2016, 1:21 PM

## 2016-08-21 NOTE — Progress Notes (Deleted)
IP PROGRESS NOTE  Subjective:   ***  Objective: Vital signs in last 24 hours: Last menstrual period 10/31/2005.  Intake/Output from previous day: No intake/output data recorded.  Physical Exam:  HEENT: *** Lungs: *** Cardiac: *** Abdomen: *** Extremities: *** ***  Portacath/PICC-without erythema  Lab Results:  Recent Labs  08/20/16 0828 08/21/16 0535  WBC 3.0* 3.2*  HGB 4.9* 7.7*  HCT 14.5* 22.0*  PLT 106* 100*    BMET  Recent Labs  08/20/16 0828 08/21/16 0535  NA 133* 139  K 3.2* 3.1*  CL 97* 105  CO2 27 28  GLUCOSE 198* 227*  BUN 30* 18  CREATININE 0.60 0.35*  CALCIUM 8.6* 8.8*    Studies/Results: Dg Chest Port 1 View  Result Date: 08/20/2016 CLINICAL DATA:  Generalized weakness beginning yesterday which shortness-of-breath. EXAM: PORTABLE CHEST 1 VIEW COMPARISON:  Chest CT 06/20/2016 FINDINGS: Lungs are hypoinflated with minimal linear density over the lateral left base likely atelectasis. No evidence of effusion or pneumothorax. Cardiomediastinal silhouette is within normal. Mild degenerate change of the spine. IMPRESSION: Minimal linear atelectasis left base. Electronically Signed   By: Marin Olp M.D.   On: 08/20/2016 08:54    Medications: I have reviewed the patient's current medications.  Assessment/Plan: 1. History of iron deficiency anemia. Mild normocytic anemia during hospital admission May 2018 2. Sickle cell trait. 3. Status post hysterectomy. 4. History of dysphagia. She underwent an endoscopy by Dr. Collene Mares.  5. Diabetes 6. Centrally necrotic, enhancing left temporal mass on MRI brain 06/18/2016-imaging features consistent with a high-grade glioma  CTs chest, abdomen, and pelvis 06/20/2016-no evidence of a primary tumor site or metastatic disease  Initiation of radiation/temozolomide 07/03/2016; Completed 07/24/2016 7. History of Nausea/vomiting and headaches secondary to #6 8. Dry skin rash both hands. 9. Viral appearing  lesions right medial buttock. Status post 7 day course of Valtrex-healed 10. Markedly elevated BUN/creatinine 07/24/2016-dehydration?Marland Kitchen She received IV fluids. Labs improved 07/27/2016    @RRHLOS @  Donneta Romberg, MD   08/21/2016, 3:07 PM

## 2016-08-22 LAB — BASIC METABOLIC PANEL
ANION GAP: 8 (ref 5–15)
BUN: 15 mg/dL (ref 6–20)
CHLORIDE: 106 mmol/L (ref 101–111)
CO2: 25 mmol/L (ref 22–32)
Calcium: 8.6 mg/dL — ABNORMAL LOW (ref 8.9–10.3)
Creatinine, Ser: 0.42 mg/dL — ABNORMAL LOW (ref 0.44–1.00)
GFR calc non Af Amer: >60 mL/min (ref >60)
Glucose, Bld: 269 mg/dL — ABNORMAL HIGH (ref 65–99)
POTASSIUM: 4.1 mmol/L (ref 3.5–5.1)
SODIUM: 139 mmol/L (ref 135–145)

## 2016-08-22 LAB — PREPARE RBC (CROSSMATCH)

## 2016-08-22 LAB — GLUCOSE, CAPILLARY
GLUCOSE-CAPILLARY: 261 mg/dL — AB (ref 65–99)
Glucose-Capillary: 206 mg/dL — ABNORMAL HIGH (ref 65–99)
Glucose-Capillary: 330 mg/dL — ABNORMAL HIGH (ref 65–99)
Glucose-Capillary: 326 mg/dL — ABNORMAL HIGH (ref 65–99)

## 2016-08-22 LAB — CBC
HCT: 18.7 % — ABNORMAL LOW (ref 36.0–46.0)
HEMOGLOBIN: 6.6 g/dL — AB (ref 12.0–15.0)
MCH: 29.1 pg (ref 26.0–34.0)
MCHC: 35.3 g/dL (ref 30.0–36.0)
MCV: 82.4 fL (ref 78.0–100.0)
Platelets: 91 10*3/uL — ABNORMAL LOW (ref 150–400)
RBC: 2.27 MIL/uL — AB (ref 3.87–5.11)
RDW: 17.3 % — ABNORMAL HIGH (ref 11.5–15.5)
WBC: 3.3 10*3/uL — ABNORMAL LOW (ref 4.0–10.5)

## 2016-08-22 LAB — DIC (DISSEMINATED INTRAVASCULAR COAGULATION) PANEL
APTT: 27 s (ref 24–36)
FIBRINOGEN: 445 mg/dL (ref 210–475)
INR: 1.15
PROTHROMBIN TIME: 14.7 s (ref 11.4–15.2)

## 2016-08-22 LAB — HEMOGLOBIN AND HEMATOCRIT, BLOOD
HEMATOCRIT: 29.7 % — AB (ref 36.0–46.0)
HEMOGLOBIN: 10.4 g/dL — AB (ref 12.0–15.0)

## 2016-08-22 LAB — SAVE SMEAR

## 2016-08-22 LAB — DIC (DISSEMINATED INTRAVASCULAR COAGULATION)PANEL
D-Dimer, Quant: 2.91 ug/mL-FEU — ABNORMAL HIGH (ref 0.00–0.50)
Platelets: 90 10*3/uL — ABNORMAL LOW (ref 150–400)
Smear Review: NONE SEEN

## 2016-08-22 LAB — VANCOMYCIN, TROUGH: Vancomycin Tr: 11 ug/mL — ABNORMAL LOW (ref 15–20)

## 2016-08-22 MED ORDER — CHLORHEXIDINE GLUCONATE CLOTH 2 % EX PADS
6.0000 | MEDICATED_PAD | Freq: Every day | CUTANEOUS | Status: AC
  Administered 2016-08-22 – 2016-08-26 (×5): 6 via TOPICAL

## 2016-08-22 MED ORDER — MUPIROCIN 2 % EX OINT
1.0000 "application " | TOPICAL_OINTMENT | Freq: Two times a day (BID) | CUTANEOUS | Status: DC
  Administered 2016-08-22 – 2016-08-26 (×9): 1 via NASAL
  Filled 2016-08-22 (×3): qty 22

## 2016-08-22 MED ORDER — EMOLLIENT BASE EX CREA
1.0000 "application " | TOPICAL_CREAM | Freq: Every day | CUTANEOUS | Status: DC
  Filled 2016-08-22: qty 1

## 2016-08-22 MED ORDER — SODIUM CHLORIDE 0.9 % IV SOLN
Freq: Once | INTRAVENOUS | Status: AC
  Administered 2016-08-22: 12:00:00 via INTRAVENOUS

## 2016-08-22 MED ORDER — IOPAMIDOL (ISOVUE-300) INJECTION 61%
15.0000 mL | Freq: Once | INTRAVENOUS | Status: DC | PRN
  Administered 2016-08-22: 15 mL via ORAL
  Filled 2016-08-22: qty 30

## 2016-08-22 MED ORDER — FUROSEMIDE 10 MG/ML IJ SOLN: 20.0000 mg | Freq: Once | INTRAMUSCULAR | Status: DC

## 2016-08-22 MED ORDER — IOPAMIDOL (ISOVUE-300) INJECTION 61%
INTRAVENOUS | Status: AC
  Filled 2016-08-22: qty 30

## 2016-08-22 MED ORDER — SODIUM CHLORIDE 0.9 % IV SOLN: Freq: Once | INTRAVENOUS | Status: DC

## 2016-08-22 MED ORDER — SONAFINE EX EMUL
1.0000 "application " | Freq: Every day | CUTANEOUS | Status: DC
  Administered 2016-08-22 – 2016-08-26 (×5): 1 via TOPICAL
  Filled 2016-08-22 (×2): qty 1

## 2016-08-22 MED ORDER — METHYLPREDNISOLONE SODIUM SUCC 125 MG IJ SOLR: 80.0000 mg | Freq: Once | INTRAMUSCULAR | Status: DC

## 2016-08-22 MED ORDER — SODIUM CHLORIDE 0.9 % IV SOLN
Freq: Once | INTRAVENOUS | Status: AC
  Administered 2016-08-22: 10:00:00 via INTRAVENOUS

## 2016-08-22 NOTE — Progress Notes (Signed)
When reviewing chart, noticed pt had transfer order for tele bed entered at 1428 this afternoon.  Discussed with [place] (current night shift primary RN)  d/t any concerns of why pt was not transferred.  She advised that she didn't see any reasons or concerns of why pt could not transfer.  I notified Fransisca Connors to review chart to make sure it was still ok to transfer pt.  Belenda Cruise advised that pt could still transfer tele bed.  I advised primary RN of such.  I also notified secretary who has requested a tele bed.  [place] [facility] Care Coordinator

## 2016-08-22 NOTE — Progress Notes (Signed)
PT Cancellation Note  Patient Details Name: Jaime [name] MRN: [id number] DOB: 09/28/1965   Cancelled Treatment:    Reason Eval/Treat Not Completed: Medical issues which prohibited therapy . Hgb 6.6, to get more blood. Check back at another time.  Claretha Cooper 08/22/2016, 8:40 AM Tresa Endo PT [phone]

## 2016-08-22 NOTE — Progress Notes (Signed)
Inpatient Diabetes Program Recommendations  AACE/ADA: New Consensus Statement on Inpatient Glycemic Control (2015)  Target Ranges:  Prepandial:   less than 140 mg/dL      Peak postprandial:   less than 180 mg/dL (1-2 hours)      Critically ill patients:  140 - 180 mg/dL   Lab Results  Component Value Date   GLUCAP 330 (H) 08/22/2016   HGBA1C 6.4 (H) 08/20/2016    Review of Glycemic Control  FBS and post-prandial blood sugars > 180 mg/dL. Needs insulin adjustment.  Inpatient Diabetes Program Recommendations:    Increase Lantus to 20 units QHS Add Novolog 3 units tidwc for meal coverage insulin while on steroids.  Will follow.  Thank you. Lorenda Peck, RD, LDN, CDE Inpatient Diabetes Coordinator [phone]

## 2016-08-22 NOTE — Progress Notes (Addendum)
Patient ID: Jaime [name], female   DOB: 1965/10/04, 51 y.o.   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   Assessment & Plan:   Principal Problem:   Symptomatic anemia, pancytopenia, ? Hemolysis  - Hg up post 2 U transfusion: 4.9 --> 7.7, now down to 6.6 this AM - no clear evidence of bleeding - per Dr. Benay Spice recommendations, will proceed with bone marrow biopsy for now  - no needs for CT abd at this time  - platelet count is also down this AM, WBC remain relatively stable  - will plan to transfuse two U PRBC this AM and will repeat CBC in AM   Active Problems:   SIRS secondary to acute illness pancytopenia, acute blood loss anemia, unlikely an infectious etiology  - unclear etiology - blood and urine cultures final report still pending  - on vanc and zosyn day #3 - if negative cultures, can stop ABX in next 24 hours     Diabetes mellitus type 2 with retinopathy ([place]) - keep on Lantus 17 U QHS, SSI    Glioblastoma multiforme of temporal lobe ([place]) - mental status clear this AM - pt tolerating diet well - transfer to telemetry unit due to ongoing tachycardia - continue dexamethasone, keppra - PT eval requested     Dehydration, tachycardia - from poor oral intake  - overall improving with IVF     Hypokalemia - supplemented and WNL    Hyponatremia  - resolved with IVF    DVT prophylaxis: SCD's Code Status: DNR Family Communication: pt and family at bedside  Disposition Plan: transfer to tele unit  Consultants:   Oncology   Procedures:   None  Antimicrobials:   Vanc 7/22 -->  Zosyn 7/22 -->  Subjective: Pt reports feeling tired but overall better.   Objective: Vitals:   08/22/16 0959 08/22/16  1000 08/22/16 1145 08/22/16 1200  BP: 94/64 (!) 96/59 100/65 100/70  Pulse: (!) 102 (!) 101 (!) 103 98  Resp: 20 16 20  (!) 22  Temp: 98.6 F (37 C)  98.4 F (36.9 C) 98.4 F (36.9 C)  TempSrc: Oral  Oral Oral  SpO2: 100% 100% 100% 100%  Weight:      Height:        Intake/Output Summary (Last 24 hours) at 08/22/16 1404 Last data filed at 08/22/16 0715  Gross per 24 hour  Intake             1975 ml  Output             1900 ml  Net               75 ml   Filed Weights   08/20/16 0803 08/20/16 0834  Weight: 65.8 kg (145 lb) 64.4 kg (142 lb)   Physical Exam  Constitutional: Appears well-developed and well-nourished. No distress.  CVS: tachycardia, no gallops, no carotid bruit.  Pulmonary: Effort and breath sounds normal, no stridor, rhonchi, wheezes, rales.  Abdominal: Soft. BS +,  no distension, tenderness, rebound or guarding.  Musculoskeletal: Normal range of motion. No edema and no tenderness.   Data Reviewed: I have personally reviewed following labs and imaging studies  CBC:  Recent Labs Lab 08/17/16 1331  08/20/16 0828 08/21/16 0535 08/22/16 0319  WBC 4.1 3.0* 3.2* 3.3*  NEUTROABS 2.8 1.8  --   --   HGB 8.9* 4.9* 7.7* 6.6*  HCT 27.3* 14.5* 22.0* 18.7*  MCV 88.1 86.3 82.1 82.4  PLT 86* 106* 100* 90*  91*   Basic Metabolic Panel:  Recent Labs Lab 08/20/16 0828 08/21/16 0535 08/22/16 0319  NA 133* 139 139  K 3.2* 3.1* 4.1  CL 97* 105 106  CO2 27 28 25   GLUCOSE 198* 227* 269*  BUN 30* 18 15  CREATININE 0.60 0.35* 0.42*  CALCIUM 8.6* 8.8* 8.6*   Liver Function Tests:  Recent Labs Lab 08/20/16 0828 08/21/16 2035  AST 31  --   ALT 23  --   ALKPHOS 53  --   BILITOT 2.8* 1.7*  PROT 5.9*  --   ALBUMIN 2.7*  --    Cardiac Enzymes:  Recent Labs Lab 08/20/16 1545 08/21/16 0033 08/21/16 0535  TROPONINI <0.03 <0.03 <0.03   HbA1C:  Recent Labs  08/20/16 1545  HGBA1C 6.4*   CBG:  Recent Labs Lab 08/21/16 1125 08/21/16 1732  08/21/16 2136 08/22/16 0745 08/22/16 1259  GLUCAP 275* 286* 343* 206* 330*   Thyroid Function Tests:  Recent Labs  08/20/16 1545  TSH 1.021   Urine analysis:    Component Value Date/Time   COLORURINE YELLOW 08/20/2016 0827   APPEARANCEUR HAZY (A) 08/20/2016 0827   LABSPEC 1.010 08/20/2016 0827   LABSPEC 1.010 08/09/2016 1502   PHURINE 5.0 08/20/2016 0827   GLUCOSEU 50 (A) 08/20/2016 0827   GLUCOSEU Negative 08/09/2016 1502   HGBUR NEGATIVE 08/20/2016 0827   BILIRUBINUR NEGATIVE 08/20/2016 0827   BILIRUBINUR Negative 08/09/2016 1502   KETONESUR NEGATIVE 08/20/2016 0827   PROTEINUR NEGATIVE 08/20/2016 0827   UROBILINOGEN 0.2 08/09/2016 1502   NITRITE NEGATIVE 08/20/2016 0827   LEUKOCYTESUR MODERATE (A) 08/20/2016 0827   LEUKOCYTESUR Negative 08/09/2016 1502   Recent Results (from the past 240 hour(s))  Blood Culture (routine x 2)     Status: None (Preliminary result)   Collection Time: 08/20/16  8:25 AM  Result Value Ref Range Status   Specimen Description BLOOD RIGHT WRIST  Final   Special Requests   Final    BOTTLES DRAWN AEROBIC AND ANAEROBIC Blood Culture adequate volume   Culture   Final    NO GROWTH 1 DAY Performed at [facility], [place] [address], [place], [place] [address]    Report Status PENDING  Incomplete  Blood Culture (routine x 2)     Status: None (Preliminary result)   Collection Time: 08/20/16  8:27 AM  Result Value Ref Range Status   Specimen Description BLOOD LEFT ANTECUBITAL  Final   Special Requests   Final    BOTTLES DRAWN AEROBIC AND ANAEROBIC Blood Culture adequate volume   Culture   Final    NO GROWTH 1 DAY Performed at [facility], [place] [address] [place], [place] [address]    Report Status PENDING  Incomplete  Urine culture     Status: Abnormal   Collection Time: 08/20/16  8:27 AM  Result Value Ref Range Status   Specimen Description URINE, RANDOM  Final   Special Requests NONE  Final   Culture MULTIPLE SPECIES PRESENT,  SUGGEST RECOLLECTION (A)  Final   Report Status 08/21/2016 FINAL  Final  MRSA PCR Screening     Status: Abnormal   Collection Time: 08/21/16  6:05 PM  Result Value Ref Range Status  MRSA by PCR POSITIVE (A) NEGATIVE Final    Comment:        The GeneXpert MRSA Assay (FDA approved for NASAL specimens only), is one component of a comprehensive MRSA colonization surveillance program. It is not intended to diagnose MRSA infection nor to guide or monitor treatment for MRSA infections. RESULT CALLED TO, READ BACK BY AND VERIFIED WITH: SCOTT,B RN 7.23.18 @2106  ZANDO,C     Radiology Studies: No results found. Scheduled Meds: . Chlorhexidine Gluconate Cloth  6 each Topical Q0600  . dexamethasone  4 mg Oral BID WC  . famotidine  20 mg Oral BID  . feeding supplement  1 Container Oral BID BM  . insulin aspart  0-15 Units Subcutaneous TID WC  . insulin aspart  0-5 Units Subcutaneous QHS  . insulin glargine  17 Units Subcutaneous QHS  . iopamidol      . levETIRAcetam  500 mg Oral BID  . multivitamin with minerals  1 tablet Oral Daily  . mupirocin ointment  1 application Nasal BID  . protein supplement shake  11 oz Oral BID BM  . sodium chloride flush  3 mL Intravenous Q12H  . SONAFINE  1 application Topical Daily  . valACYclovir  1,000 mg Oral Daily   Continuous Infusions: . sodium chloride    . sodium chloride    . piperacillin-tazobactam (ZOSYN)  IV Stopped (08/22/16 0912)  . vancomycin 750 mg (08/22/16 1206)     LOS: 2 days   Time spent: 35 minutes   Faye Ramsay, MD [facility] Pager [phone]  If 7PM-7AM, please contact night-coverage [email or web address] Password [facility] 08/22/2016, 2:04 PM

## 2016-08-22 NOTE — Progress Notes (Signed)
Pharmacy Antibiotic Note  Jaime [name] is a 51 y.o. female admitted on 08/20/2016 with sepsis.  Pharmacy has been consulted for Vanc/Zosyn dosing on 7/22.  Plan: 1) Continue vancomycin 750mg  IV q12 2) Continue Zosyn 3.375g IV q8 (extended interval infusion) 3) Daily SCr 4) Will order vanc trough tonight prior to midnight dose - goal 15-20 mcg/mL 5) narrow or discontinue abx's as soon as clinically appropriate - cultures negative to date  Height: 5\' 5"  (165.1 cm) Weight: 142 lb (64.4 kg) IBW/kg (Calculated) : 57  Temp (24hrs), Avg:98 F (36.7 C), Min:97.4 F (36.3 C), Max:98.7 F (37.1 C)   Recent Labs Lab 08/17/16 1331 08/20/16 0828 08/20/16 0840 08/20/16 1144 08/21/16 0535 08/22/16 0319  WBC 4.1 3.0*  --   --  3.2* 3.3*  CREATININE  --  0.60  --   --  0.35* 0.42*  LATICACIDVEN  --   --  2.24* 1.67  --   --     Estimated Creatinine Clearance: 76.5 mL/min (A) (by C-G formula based on SCr of 0.42 mg/dL (L)).    No Known Allergies    Thank you for allowing pharmacy to be a part of this patient's care.   Adrian Saran, PharmD, BCPS Pager [phone] 08/22/2016 8:57 AM

## 2016-08-22 NOTE — Progress Notes (Signed)
CRITICAL VALUE ALERT  Critical Value: Hgb 6.6  Date & Time Notied: 0415  Provider Notified: [facility]  Orders Received/Actions taken: 1 unit RBC's ordered approximately 2010

## 2016-08-22 NOTE — Progress Notes (Signed)
Placed order for 1 Unit of blood per protocol. Previous unit was on unit >30 min per protocol, new blood product requested.

## 2016-08-22 NOTE — Progress Notes (Signed)
IP PROGRESS NOTE  Subjective:   Ms. Hirsch denies nausea and headache. No complaint.    Objective: Vital signs in last 24 hours: Blood pressure 100/70, pulse 98, temperature 98.4 F (36.9 C), temperature source Oral, resp. rate (!) 22, height 5' 5"  (1.651 m), weight 142 lb (64.4 kg), last menstrual period 10/31/2005, SpO2 100 %.  Intake/Output from previous day: 07/23 0701 - 07/24 0700 In: 3630 [P.O.:1320; I.V.:2130; Blood:30; IV Piggyback:150] Out: 2225 [Urine:2225]  Physical Exam:   Abdomen: No hepatomegaly, no mass, nontender Extremities: No leg edema    Lab Results:  Recent Labs  08/21/16 0535 08/22/16 0319  WBC 3.2* 3.3*  HGB 7.7* 6.6*  HCT 22.0* 18.7*  PLT 100* 90*  91*  Blood smear 08/22/2016: The platelets appear normal in number, large platelets are present. There are a few band neutrophils. No blasts. Rare nucleated red cell. A few ovalocytes and rare teardrop. Mild increase in macrocytic polychromatophilic cells. No schistocytes.  BMET  Recent Labs  08/21/16 0535 08/22/16 0319  NA 139 139  K 3.1* 4.1  CL 105 106  CO2 28 25  GLUCOSE 227* 269*  BUN 18 15  CREATININE 0.35* 0.42*  CALCIUM 8.8* 8.6*  08/21/2016-bilirubin 1.7, indirect bilirubin 1.4, LDH 398 Reticulocyte count 43.4 PT 14.7, PTT 27 Studies/Results: No results found.  Medications: I have reviewed the patient's current medications.  Assessment/Plan: 1. History of iron deficiency anemia. Mild normocytic anemia during hospital admission May 2018 2. Sickle cell trait. 3. Status post hysterectomy. 4. History of dysphagia. She underwent an endoscopy by Dr. Collene Mares.  5. Diabetes 6. Centrally necrotic, enhancing left temporal mass on MRI brain 06/18/2016-imaging features consistent with a high-grade glioma  CTs chest, abdomen, and pelvis 06/20/2016-no evidence of a primary tumor site or metastatic disease  Initiation of radiation/temozolomide 07/03/2016; Completed 07/24/2016  Restaging  brain MRI 08/11/2016-mild increase in the size of the enhancing left temporal mass, decreased edema/mass effect 7. History of Nausea/vomiting and headaches secondary to #6 8. Dry skin rash both hands. 9. Viral appearing lesions right medial buttock. Status post 7 day course of Valtrex-healed 10. Markedly elevated BUN/creatinine-likely related to dehydration versus GI bleeding 11. Severe anemia, mild thrombocytopenia-potentially related to temozolomide/radiation or primary bone marrow process,    She has persistent severe anemia following a red cell transfusions. The platelet count is mildly decreased.  The elevated indirect bilirubin and LDH suggests a degree of hemolysis. She is not mounting an appropriate reticulocytosis. I she could have a leukoerythroblastic process. This would be very unusual toxicity from temozolomide. I doubt the anemia is related to the sickle cell trait. No clinical evidence for hemolytic uremic syndrome or TTP.  Recommendations:  1. Proceed with diagnostic bone marrow biopsy 2. Agree with red cell transfusion today      LOS: 2 days   Donneta Romberg, MD   08/22/2016, 1:58 PM

## 2016-08-23 ENCOUNTER — Inpatient Hospital Stay (HOSPITAL_COMMUNITY): Payer: Medicaid Other

## 2016-08-23 ENCOUNTER — Encounter (HOSPITAL_COMMUNITY): Payer: Self-pay | Admitting: Radiology

## 2016-08-23 DIAGNOSIS — E11319 Type 2 diabetes mellitus with unspecified diabetic retinopathy without macular edema: Secondary | ICD-10-CM

## 2016-08-23 DIAGNOSIS — E43 Unspecified severe protein-calorie malnutrition: Secondary | ICD-10-CM

## 2016-08-23 LAB — CBC WITH DIFFERENTIAL/PLATELET
BASOS ABS: 0 10*3/uL (ref 0.0–0.1)
BASOS PCT: 1 %
EOS PCT: 0 %
Eosinophils Absolute: 0 10*3/uL (ref 0.0–0.7)
HCT: 28 % — ABNORMAL LOW (ref 36.0–46.0)
Hemoglobin: 9.7 g/dL — ABNORMAL LOW (ref 12.0–15.0)
LYMPHS PCT: 30 %
Lymphs Abs: 1.2 10*3/uL (ref 0.7–4.0)
MCH: 29.5 pg (ref 26.0–34.0)
MCHC: 34.6 g/dL (ref 30.0–36.0)
MCV: 85.1 fL (ref 78.0–100.0)
MONO ABS: 0.1 10*3/uL (ref 0.1–1.0)
Monocytes Relative: 3 %
NEUTROS ABS: 2.6 10*3/uL (ref 1.7–7.7)
Neutrophils Relative %: 66 %
PLATELETS: 89 10*3/uL — AB (ref 150–400)
RBC: 3.29 MIL/uL — AB (ref 3.87–5.11)
RDW: 16.4 % — AB (ref 11.5–15.5)
WBC: 3.9 10*3/uL — AB (ref 4.0–10.5)

## 2016-08-23 LAB — COMPREHENSIVE METABOLIC PANEL
ALT: 31 U/L (ref 14–54)
AST: 28 U/L (ref 15–41)
Albumin: 2.7 g/dL — ABNORMAL LOW (ref 3.5–5.0)
Alkaline Phosphatase: 61 U/L (ref 38–126)
Anion gap: 8 (ref 5–15)
BILIRUBIN TOTAL: 1.3 mg/dL — AB (ref 0.3–1.2)
BUN: 22 mg/dL — ABNORMAL HIGH (ref 6–20)
CHLORIDE: 104 mmol/L (ref 101–111)
CO2: 27 mmol/L (ref 22–32)
CREATININE: 0.51 mg/dL (ref 0.44–1.00)
Calcium: 9.1 mg/dL (ref 8.9–10.3)
Glucose, Bld: 250 mg/dL — ABNORMAL HIGH (ref 65–99)
POTASSIUM: 3.8 mmol/L (ref 3.5–5.1)
Sodium: 139 mmol/L (ref 135–145)
TOTAL PROTEIN: 5.9 g/dL — AB (ref 6.5–8.1)

## 2016-08-23 LAB — BPAM RBC
BLOOD PRODUCT EXPIRATION DATE: 201808032359
BLOOD PRODUCT EXPIRATION DATE: 201808032359
BLOOD PRODUCT EXPIRATION DATE: 201808072359
Blood Product Expiration Date: 201807312359
Blood Product Expiration Date: 201808072359
ISSUE DATE / TIME: 201807221109
ISSUE DATE / TIME: 201807240942
ISSUE DATE / TIME: 201807221947
ISSUE DATE / TIME: 201807240648
ISSUE DATE / TIME: 201807241132
UNIT TYPE AND RH: 6200
UNIT TYPE AND RH: 6200
UNIT TYPE AND RH: 6200
Unit Type and Rh: 6200
Unit Type and Rh: 6200

## 2016-08-23 LAB — TYPE AND SCREEN
ABO/RH(D): A POS
Antibody Screen: NEGATIVE
UNIT DIVISION: 0
UNIT DIVISION: 0
UNIT DIVISION: 0
Unit division: 0
Unit division: 0

## 2016-08-23 LAB — HAPTOGLOBIN: HAPTOGLOBIN: 138 mg/dL (ref 34–200)

## 2016-08-23 LAB — GLUCOSE, CAPILLARY
GLUCOSE-CAPILLARY: 183 mg/dL — AB (ref 65–99)
GLUCOSE-CAPILLARY: 183 mg/dL — AB (ref 65–99)
GLUCOSE-CAPILLARY: 405 mg/dL — AB (ref 65–99)
Glucose-Capillary: 470 mg/dL — ABNORMAL HIGH (ref 65–99)

## 2016-08-23 LAB — LACTATE DEHYDROGENASE: LDH: 390 U/L — ABNORMAL HIGH (ref 98–192)

## 2016-08-23 MED ORDER — FENTANYL CITRATE (PF) 100 MCG/2ML IJ SOLN
INTRAMUSCULAR | Status: AC
  Filled 2016-08-23: qty 4

## 2016-08-23 MED ORDER — FLUMAZENIL 0.5 MG/5ML IV SOLN
INTRAVENOUS | Status: AC
  Filled 2016-08-23: qty 5

## 2016-08-23 MED ORDER — FENTANYL CITRATE (PF) 100 MCG/2ML IJ SOLN
INTRAMUSCULAR | Status: AC | PRN
  Administered 2016-08-23: 50 ug via INTRAVENOUS
  Administered 2016-08-23: 25 ug via INTRAVENOUS

## 2016-08-23 MED ORDER — LIDOCAINE HCL (PF) 1 % IJ SOLN
INTRAMUSCULAR | Status: AC | PRN
  Administered 2016-08-23: 10 mL via INTRADERMAL

## 2016-08-23 MED ORDER — NALOXONE HCL 0.4 MG/ML IJ SOLN
INTRAMUSCULAR | Status: AC
  Filled 2016-08-23: qty 1

## 2016-08-23 MED ORDER — MIDAZOLAM HCL 2 MG/2ML IJ SOLN
INTRAMUSCULAR | Status: AC
  Filled 2016-08-23: qty 4

## 2016-08-23 MED ORDER — VANCOMYCIN HCL IN DEXTROSE 1-5 GM/200ML-% IV SOLN
1000.0000 mg | Freq: Two times a day (BID) | INTRAVENOUS | Status: DC
  Administered 2016-08-23 – 2016-08-24 (×2): 1000 mg via INTRAVENOUS
  Filled 2016-08-23 (×2): qty 200

## 2016-08-23 MED ORDER — MIDAZOLAM HCL 2 MG/2ML IJ SOLN
INTRAMUSCULAR | Status: AC | PRN
  Administered 2016-08-23: 0.5 mg via INTRAVENOUS
  Administered 2016-08-23: 1 mg via INTRAVENOUS

## 2016-08-23 MED ORDER — INSULIN ASPART 100 UNIT/ML ~~LOC~~ SOLN
8.0000 [IU] | Freq: Once | SUBCUTANEOUS | Status: AC
  Administered 2016-08-23: 8 [IU] via SUBCUTANEOUS

## 2016-08-23 NOTE — Progress Notes (Signed)
Pharmacy Antibiotic Note  Jaime [name] is a 51 y.o. female admitted on 08/20/2016 with sepsis.  Pharmacy has been consulted for Vanc/Zosyn dosing on 7/22. Vancomycin trough drawn tonight is 11 mcg/ml - below goal of 15-20 mcg/ml for sepsis.   Plan: - increase vancomycin to 1000 mg IV q12 - Continue Zosyn 3.375g IV q8 (extended interval infusion) -Daily SCr  -narrow or discontinue abx's as soon as clinically appropriate - cultures negative to date  Height: 5' 2.5" (158.8 cm) Weight: 155 lb 9.6 oz (70.6 kg) IBW/kg (Calculated) : 51.25  Temp (24hrs), Avg:98.3 F (36.8 C), Min:97.5 F (36.4 C), Max:98.8 F (37.1 C)   Recent Labs Lab 08/17/16 1331 08/20/16 0828 08/20/16 0840 08/20/16 1144 08/21/16 0535 08/22/16 0319 08/22/16 2252  WBC 4.1 3.0*  --   --  3.2* 3.3*  --   CREATININE  --  0.60  --   --  0.35* 0.42*  --   LATICACIDVEN  --   --  2.24* 1.67  --   --   --   VANCOTROUGH  --   --   --   --   --   --  11*    Estimated Creatinine Clearance: 79.2 mL/min (A) (by C-G formula based on SCr of 0.42 mg/dL (L)).    No Known Allergies    Thank you for allowing pharmacy to be a part of this patient's care.  Eudelia Bunch, Pharm.D. [phone] 08/23/2016 12:39 AM

## 2016-08-23 NOTE — Progress Notes (Signed)
Patient transferred to tele bed RM 1422. Report given to Joellen Jersey, RN prior to transport.

## 2016-08-23 NOTE — Consult Note (Signed)
Chief Complaint: Patient was seen in consultation today for CT-guided bone marrow biopsy Chief Complaint  Patient presents with  . Weakness    Referring Physician(s): Jaime [name],B  Supervising Physician: Jaime [name]  Patient Status: [facility] - In-pt  History of Present Illness: Jaime [name] is a 51 y.o. 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 She has subsequently been transfused but continues to be anemic. She has also thrombocytopena with elevated indirect bilirubin and LDH suggesting a degree of hemolysis. Request now received for diagnostic bone marrow biopsy for further evaluation.  Past Medical History:  Diagnosis Date  . Candida, oral 06/22/2016   Due to steroids 06/22/16  . Diabetes mellitus   . DUB (dysfunctional uterine bleeding)    total abdominal hysterectomy in 2007, ovaries intact  . Furuncle of multiple sites    multiple I and D's  . GERD (gastroesophageal reflux disease)   . Hydradenitis   . Intellectual disability   . 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 ABDOMINAL HYSTERECTOMY    . HYDRADENITIS EXCISION    . TONSILLECTOMY AND ADENOIDECTOMY     in early childhood    Allergies: Patient has no known allergies.  Medications: Prior to Admission medications   Medication Sig Start Date End Date Taking? Authorizing Provider  ACCU-CHEK AVIVA PLUS test strip USE TO TEST BLOOD SUGAR FOUR TIMES DAILY 07/11/16  Yes Minus Liberty, MD  ACCU-CHEK SOFTCLIX LANCETS lancets USE TO CHECK BLOOD SUGAR FOUR TIMES DAILY 07/11/16  Yes Minus Liberty, MD  acetaminophen (TYLENOL) 500 MG tablet Take 500 mg by mouth every 6 (six) hours as needed for moderate pain.     Yes [provider]  aspirin EC 81 MG tablet Take 81 mg by mouth daily.   Yes [provider]  blood glucose meter kit and supplies Dispense based on patient and insurance preference. Use up to four times daily as directed. (FOR ICD-9 250.00, 250.01). 06/21/16  Yes Holley Raring, MD  dexamethasone (DECADRON) 4 MG tablet Take 1 tablet (4 mg total) by mouth 2 (two) times daily with a meal. Patient taking differently: Take 2 mg by mouth daily.  07/24/16  Yes Ladell Pier, MD  emollient (BIAFINE) cream Apply 1 application topically daily. 07/03/16  Yes [provider]  famotidine (PEPCID) 20 MG tablet Take 1 tablet (20 mg total) by mouth 2 (two) times daily. 07/03/16  Yes Minus Liberty, MD  insulin aspart (NOVOLOG FLEXPEN) 100 UNIT/ML FlexPen Inject 5 Units into the skin 3 (three) times daily with meals. Patient taking differently: Inject 3-5 Units into the skin 3 (three) times daily with meals.  06/21/16  Yes Holley Raring, MD  insulin glargine (LANTUS) 100 unit/mL SOPN Inject 0.17 mLs (17 Units total) into the skin at bedtime. 06/28/16  Yes Alphonzo Grieve, MD  Insulin Pen Needle 31G X 5 MM MISC Use for injections under the skin as directed. 06/21/16  Yes Holley Raring, MD  levETIRAcetam (KEPPRA) 500 MG tablet TAKE 1 TABLET BY MOUTH TWICE DAILY 07/17/16  Yes Minus Liberty, MD  metFORMIN (GLUCOPHAGE) 1000 MG tablet Take 1 tablet (1,000 mg total) by mouth 2 (two) times daily with a meal. 06/21/16 09/19/16 Yes Holley Raring, MD  Nutritional Supplements (FEEDING SUPPLEMENT,  BOOST BREEZE,) LIQD Take 1 Bottle by mouth 3 (three) times daily.  08/17/16  Yes [provider]  ondansetron (ZOFRAN ODT) 4 MG disintegrating tablet Take 1 tablet (4 mg total) by mouth every 8 (eight) hours as needed for nausea or vomiting. 07/24/16  Yes Ladell Pier, MD  valACYclovir (VALTREX) 1000 MG tablet Take 1,000 mg by mouth daily.  07/11/16  Yes [provider]  fluticasone  (FLONASE) 50 MCG/ACT nasal spray Place 2 sprays into both nostrils daily. Patient not taking: Reported on 08/13/2016 06/08/16   Barnet Glasgow, NP  lisinopril (PRINIVIL,ZESTRIL) 40 MG tablet Take 1 tablet (40 mg total) by mouth daily. 07/07/16 08/06/16  Minus Liberty, MD  loratadine (CLARITIN) 10 MG tablet Take 1 tablet (10 mg total) by mouth daily. Patient not taking: Reported on 08/13/2016 07/03/16   Minus Liberty, MD  LORazepam (ATIVAN) 0.5 MG tablet 1 tablet po 30 minutes prior to radiation or MRI Patient not taking: Reported on 08/20/2016 06/21/16   Hayden Pedro, PA-C  montelukast (SINGULAIR) 10 MG tablet Take 1 tablet (10 mg total) by mouth at bedtime. Patient not taking: Reported on 08/20/2016 06/08/16   Barnet Glasgow, NP  temozolomide (TEMODAR) 140 MG capsule Take 1 capsule (140 mg total) by mouth daily. May take on an empty stomach or at bedtime to decrease nausea & vomiting. Patient not taking: Reported on 08/20/2016 06/27/16   Ladell Pier, MD     Family History  Problem Relation Age of Onset  . Cancer Mother 66       Breast cancer  . Hypertension Mother   . Depression Mother   . Sickle cell trait Mother   . Cancer Father 19       lung cancer, was a long term smoker    Social History   Social History  . Marital status: Single    Spouse name: N/A  . Number of children: N/A  . Years of education: N/A   Social History Main Topics  . Smoking status: Never Smoker  . Smokeless tobacco: Never Used  . Alcohol use No  . Drug use: No  . Sexual activity: Not Currently    Birth control/ protection: Surgical   Other Topics Concern  . 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   No kids.   No sexual activity   Lives with her mother    ECOG Status:   Review of Systems see above; currently denies fever, headache, chest pain, dyspnea,  cough,,/back pain, nausea vomiting or bleeding.  Vital Signs: BP 105/68 (BP Location: Right Arm)   Pulse (!) 104   Temp 99.1 F (37.3 C) (Oral)   Resp 18   Ht 5' 2.5" (1.588 m)   Wt 155 lb 9.6 oz (70.6 kg)   LMP 10/31/2005   SpO2 99%   BMI 28.01 kg/m   Physical Exam awake, answers most questions appropriately with slight delay. Chest clear to auscultation bilaterally. Heart with tachycardia but regular rhythm. Abdomen soft, positive bowel sounds, nontender. No lower extremity edema.  Mallampati Score:     Imaging: Mr Jeri Cos TT Contrast  Result Date: 08/11/2016 CLINICAL DATA:  51 y/o F; temporal lobe glioblastoma. Radiation and temozolomide treatment completed 07/24/2016. Currently receiving Decadron. EXAM: MRI HEAD WITHOUT AND WITH CONTRAST TECHNIQUE: Multiplanar, multiecho pulse sequences of the brain and surrounding structures were obtained without and with intravenous  contrast. CONTRAST:  44m MULTIHANCE GADOBENATE DIMEGLUMINE 529 MG/ML IV SOLN COMPARISON:  06/18/2016 MRI of the head. FINDINGS: Brain: Necrotic mass centered within the left medial temporal lobe and hippocampus extending as far posteriorly as the left forceps of splenium of corpus callosum. The enhancing component measures up to 63 x 51 x 33 mm (AP x ML x CC series 13, image 23 and series 14, image 32). On the prior study measured in a similar fashion the mass measured 60 x 48 x 29 mm. T2 FLAIR signal abnormality extends throughout white matter in the left temporal lobe, left occipital lobe, left splenium of corpus callosum, left posterior limb of internal capsule, and left subinsular white matter. In comparison with the prior study the distribution T2 FLAIR signal abnormality is slightly decreased in its superior extension into the parietal lobe and anterior extension into the insula. Mass effect effaces the temporal horn of left lateral ventricle, abuts the left cerebral peduncle with rightward displacement, and 6 mm of  left-to-right midline shift. No focus of reduced diffusion elsewhere in the brain to suggest acute or early subacute infarct. No evidence for acute intracranial hemorrhage. No additional focus of enhancement is identified to suggest multicentric disease. Vascular: Normal flow voids. Skull and upper cervical spine: Normal marrow signal. Sinuses/Orbits: Negative. Other: None. IMPRESSION: 1. Necrotic enhancing mass within the left medial temporal lobe. The enhancing portion of the mass has slightly increased in size in comparison with the prior MRI of the brain. 2. Surround T2 FLAIR hyperintense signal abnormality may represent edema and/or nonenhancing tumor. Signal abnormality is decreased in anterior extension into insula and superior extension into parietal lobe. 3. Decreased mass effect with 6 mm left-to-right midline shift, previously 11 mm. 4. Overall mixed imaging response with increasing enhancing tumor and decreased edema / mass effect. These results were called by telephone at the time of interpretation on 08/11/2016 at 6:08 pm to Dr. FBurr Medico who verbally acknowledged these results. Electronically Signed   By: LKristine GarbeM.D.   On: 08/11/2016 18:11   Dg Chest Port 1 View  Result Date: 08/20/2016 CLINICAL DATA:  Generalized weakness beginning yesterday which shortness-of-breath. EXAM: PORTABLE CHEST 1 VIEW COMPARISON:  Chest CT 06/20/2016 FINDINGS: Lungs are hypoinflated with minimal linear density over the lateral left base likely atelectasis. No evidence of effusion or pneumothorax. Cardiomediastinal silhouette is within normal. Mild degenerate change of the spine. IMPRESSION: Minimal linear atelectasis left base. Electronically Signed   By: DMarin OlpM.D.   On: 08/20/2016 08:54    Labs:  CBC:  Recent Labs  08/17/16 1331 08/20/16 0828 08/21/16 0535 08/22/16 0319 08/22/16 1928  WBC 4.1 3.0* 3.2* 3.3*  --   HGB 8.9* 4.9* 7.7* 6.6* 10.4*  HCT 27.3* 14.5* 22.0* 18.7* 29.7*    PLT 86* 106* 100* 90*  91*  --     COAGS:  Recent Labs  06/18/16 0453 08/22/16 0319  INR 1.12 1.15  APTT 28 27    BMP:  Recent Labs  08/20/16 0828 08/21/16 0535 08/22/16 0319 08/23/16 0357  NA 133* 139 139 139  K 3.2* 3.1* 4.1 3.8  CL 97* 105 106 104  CO2 27 28 25 27   GLUCOSE 198* 227* 269* 250*  BUN 30* 18 15 22*  CALCIUM 8.6* 8.8* 8.6* 9.1  CREATININE 0.60 0.35* 0.42* 0.51  GFRNONAA >60 >60 >60 >60  GFRAA >60 >60 >60 >60    LIVER FUNCTION TESTS:  Recent Labs  08/09/16 1502 08/13/16 1705 08/20/16 0828 08/21/16  2035 08/23/16 0357  BILITOT 0.56 0.3 2.8* 1.7* 1.3*  AST 17 20 31   --  28  ALT 14 15 23   --  31  ALKPHOS 48 42 53  --  61  PROT 6.9 5.9* 5.9*  --  5.9*  ALBUMIN 3.3* 3.0* 2.7*  --  2.7*    TUMOR MARKERS: No results for input(s): AFPTM, CEA, CA199, CHROMGRNA in the last 8760 hours.  Assessment and Plan: 51 y.o. 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 She has subsequently been transfused but continues to be anemic. She has also thrombocytopena with elevated indirect bilirubin and LDH suggesting a degree of hemolysis. Request now received for diagnostic bone marrow biopsy for further evaluation.Risks and benefits discussed with the patient/mother including, but not limited to bleeding, infection, damage to adjacent structures or low yield requiring additional tests.All of the patient's questions were answered, patient is agreeable to proceed.Consent signed and in chart.Procedure scheduled for this morning.       Thank you for this interesting consult.  I greatly enjoyed meeting Jaime [name] and look forward to participating in their care.  A copy of this report was sent to the requesting provider on this  date.  Electronically Signed: D. Rowe Robert, PA-C 08/23/2016, 9:28 AM   I spent a total of 25 minutes    in face to face in clinical consultation, greater than 50% of which was counseling/coordinating care for CT guided bone marrow biopsy

## 2016-08-23 NOTE — Progress Notes (Signed)
PT Cancellation Note  Patient Details Name: LONETA TAMPLIN MRN: [id number] DOB: 02-Jun-1965   Cancelled Treatment:    Reason Eval/Treat Not Completed: Patient at procedure or test/unavailable.  Will check back as schedule permits.   Arsenio Schnorr,KATHrine E 08/23/2016, 9:38 AM Carmelia Bake, PT, DPT 08/23/2016 Pager: [phone]

## 2016-08-23 NOTE — Progress Notes (Signed)
Physical Therapy Evaluation Patient Details Name: Jaime [name] MRN: [id number] DOB: October 12, 1965 Today's Date: 08/23/2016   History of Present Illness  Pt is a 51 y.o. female s/p chemotherapy for a high-grade glioma. Pt presented to the hospital after several days of generalized weakness. Pt admitted for symptomatic anemia.  Clinical Impression  Pt admitted with conditions listed above. Pt currently with functional limitations due to the deficits listed below. Pt will benefit from skilled PT to increase their independence and safety with mobility to allow for discharge. Pt able to ambulate 400 feet without assistive device but lost her balance twice and required assist to steady. Pt also drifted towards the right during ambulation.     Follow Up Recommendations Home health PT;Supervision - Intermittent    Equipment Recommendations  Rolling walker with 5" wheels    Recommendations for Other Services       Precautions / Restrictions Precautions Precautions: Fall Restrictions Weight Bearing Restrictions: No      Mobility  Bed Mobility Overal bed mobility: Needs Assistance Bed Mobility: Supine to Sit     Supine to sit: Min guard     General bed mobility comments: Pt required hand held assist to turn over in bed   Transfers Overall transfer level: Needs assistance Equipment used: None Transfers: Sit to/from Stand Sit to Stand: Min guard         General transfer comment: Pt able to rise and steady on her own, min/guard provided for safety   Ambulation/Gait Ambulation/Gait assistance: Min assist Ambulation Distance (Feet): 400 Feet Assistive device: None Gait Pattern/deviations: Step-through pattern;Drifts right/left;Decreased stride length Gait velocity: decr   General Gait Details: Pt able to ambulate without assistive device, pt continually drifted right throughout session, pt had a loss of balance twice and required assist to steady  Stairs             Wheelchair Mobility    Modified Rankin (Stroke Patients Only)       Balance Overall balance assessment: Needs assistance   Sitting balance-Leahy Scale: Fair       Standing balance-Leahy Scale: Good                 High Level Balance Comments: When ambulating pt drifted towards the right and lost her balance twice while ambulating 400 feet             Pertinent Vitals/Pain Pain Assessment: No/denies pain    Home Living Family/patient expects to be discharged to:: Private residence Living Arrangements: Parent Available Help at Discharge: Family Type of Home: House Home Access: Level entry     Home Layout: Able to live on main level with bedroom/bathroom        Prior Function Level of Independence: Independent               Hand Dominance        Extremity/Trunk Assessment   Upper Extremity Assessment Upper Extremity Assessment: Overall WFL for tasks assessed    Lower Extremity Assessment Lower Extremity Assessment: Generalized weakness       Communication   Communication: No difficulties  Cognition Arousal/Alertness: Awake/alert Behavior During Therapy: WFL for tasks assessed/performed Overall Cognitive Status: History of cognitive impairments - at baseline                                 General Comments: Pt reports having equipment at home that mom states they do not have,  pt has history of intellectual disability       General Comments      Exercises     Assessment/Plan    PT Assessment Patient needs continued PT services  PT Problem List Decreased strength;Decreased safety awareness;Decreased coordination;Decreased activity tolerance;Decreased balance;Decreased knowledge of use of DME       PT Treatment Interventions Gait training;Therapeutic activities;Therapeutic exercise;Patient/family education;Functional mobility training    PT Goals (Current goals can be found in the Care Plan section)  Acute Rehab  PT Goals PT Goal Formulation: With patient Time For Goal Achievement: 09/06/16 Potential to Achieve Goals: Good    Frequency Min 3X/week   Barriers to discharge        Co-evaluation               AM-PAC PT "6 Clicks" Daily Activity  Outcome Measure Difficulty turning over in bed (including adjusting bedclothes, sheets and blankets)?: Total Difficulty moving from lying on back to sitting on the side of the bed? : Total Difficulty sitting down on and standing up from a chair with arms (e.g., wheelchair, bedside commode, etc,.)?: A Little Help needed moving to and from a bed to chair (including a wheelchair)?: A Little Help needed walking in hospital room?: A Little Help needed climbing 3-5 steps with a railing? : A Little 6 Click Score: 14    End of Session Equipment Utilized During Treatment: Gait belt Activity Tolerance: Patient tolerated treatment well Patient left: in chair;with family/visitor present;with call bell/phone within reach;with chair alarm set Nurse Communication: Mobility status (RN observed mobility) PT Visit Diagnosis: Difficulty in walking, not elsewhere classified (R26.2)    Time: 1333-1350 PT Time Calculation (min) (ACUTE ONLY): 17 min   Charges:   PT Evaluation $PT Eval Low Complexity: 1 Procedure     PT G CodesOlegario [name], SPT  Reino Bellis 08/23/2016, 2:54 PM

## 2016-08-23 NOTE — Procedures (Signed)
Interventional Radiology Procedure Note  Procedure: CT guided aspirate and core biopsy of right iliac bone Complications: None Recommendations: - Bedrest supine x 1 hrs - Follow biopsy results  Nadina Fomby T. Berlie Hatchel, M.D Pager:  [phone]   

## 2016-08-23 NOTE — Progress Notes (Signed)
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   Assessment & Plan: Principal Problem:   Symptomatic anemia Active Problems:   Diabetes mellitus type 2 with retinopathy (HCC)   Glioblastoma multiforme of temporal lobe (HCC)   Dehydration   Acute prerenal azotemia   SIRS (systemic inflammatory response syndrome) (HCC)   Protein-calorie malnutrition, severe   Symptomatic anemia, pancytopenia, ? Hemolysis  -Hg up post 2 U transfusion: 4.9 --> 7.7, down to 6.6 on 7/24, transfused 2 additional units, improved appropriately to 9.7 this morning.  Continues to have leukopenia as well as thrombocytopenia -no clear evidence of bleeding -per Dr. Benay Spice recommendations, will proceed with bone marrow biopsy for now, IR did biopsy 7/25  SIRS secondary to acute illness pancytopenia, acute blood loss anemia, unlikely an infectious etiology  -unclear etiology -blood and urine cultures final report still pending, no growth at 3 days -on vanc and zosyn day #4 -if negative cultures, can stop ABX in next 24 hours   Diabetes mellitus type 2 with retinopathy (HCC) -Continue Lantus and sliding scale, CBGs 180s-250s  Glioblastoma multiforme of temporal lobe (HCC) -mental status clear this AM  Dehydration, tachycardia -from poor oral intake  -overall improving with IVF   Hypokalemia -supplemented and WNL  Hyponatremia  -resolved with IVF    DVT prophylaxis: SCD Code Status: DNR Family Communication: mother at bedside Disposition Plan: home when ready   Consultants:   Oncology  IR  Procedures:   BM biopsy 7/25  Antimicrobials:  Vanc 7/22 -->  Zosyn 7/22 -->   Subjective: - no chest pain, shortness of breath, no  abdominal pain, nausea or vomiting. Tired.  Objective: Vitals:   08/23/16 1023 08/23/16 1028 08/23/16 1033 08/23/16 1037  BP: 106/73 110/73 107/72   Pulse: (!) 102 (!) 101 (!) 101   Resp: 17 18 14    Temp:      TempSrc:      SpO2: 100% 100% 100% 98%  Weight:      Height:        Intake/Output Summary (Last 24 hours) at 08/23/16 1346 Last data filed at 08/23/16 0601  Gross per 24 hour  Intake             1085 ml  Output             2200 ml  Net            -1115 ml   Filed Weights   08/20/16 0803 08/20/16 0834 08/23/16 0007  Weight: 65.8 kg (145 lb) 64.4 kg (142 lb) 70.6 kg (155 lb 9.6 oz)    Examination:  Vitals:   08/23/16 1023 08/23/16 1028 08/23/16 1033 08/23/16 1037  BP: 106/73 110/73 107/72   Pulse: (!) 102 (!) 101 (!) 101   Resp: 17 18 14    Temp:      TempSrc:      SpO2: 100% 100% 100% 98%  Weight:      Height:        Constitutional: NAD Neck: normal, supple, no masses, no thyromegaly Respiratory: clear to auscultation bilaterally, no wheezing, no crackles. Normal respiratory effort.  Cardiovascular: Regular rate and rhythm, no murmurs / rubs / gallops. No LE edema. 2+ pedal pulses. Abdomen: no tenderness. Bowel  sounds positive.   Skin: no rashes, lesions, ulcers. No induration Neurologic: non focal   Data Reviewed: I have independently reviewed following labs and imaging studies  CBC:  Recent Labs Lab 08/17/16 1331 08/20/16 0828 08/21/16 0535 08/22/16 0319 08/22/16 1928 08/23/16 1119  WBC 4.1 3.0* 3.2* 3.3*  --  3.9*  NEUTROABS 2.8 1.8  --   --   --  2.6  HGB 8.9* 4.9* 7.7* 6.6* 10.4* 9.7*  HCT 27.3* 14.5* 22.0* 18.7* 29.7* 28.0*  MCV 88.1 86.3 82.1 82.4  --  85.1  PLT 86* 106* 100* 90*  91*  --  89*   Basic Metabolic Panel:  Recent Labs Lab 08/20/16 0828 08/21/16 0535 08/22/16 0319 08/23/16 0357  NA 133* 139 139 139  K 3.2* 3.1* 4.1 3.8  CL 97* 105 106 104  CO2 27 28 25 27   GLUCOSE 198* 227* 269* 250*  BUN 30* 18 15 22*    CREATININE 0.60 0.35* 0.42* 0.51  CALCIUM 8.6* 8.8* 8.6* 9.1   GFR: Estimated Creatinine Clearance: 79.2 mL/min (by C-G formula based on SCr of 0.51 mg/dL). Liver Function Tests:  Recent Labs Lab 08/20/16 0828 08/21/16 2035 08/23/16 0357  AST 31  --  28  ALT 23  --  31  ALKPHOS 53  --  61  BILITOT 2.8* 1.7* 1.3*  PROT 5.9*  --  5.9*  ALBUMIN 2.7*  --  2.7*   No results for input(s): LIPASE, AMYLASE in the last 168 hours. No results for input(s): AMMONIA in the last 168 hours. Coagulation Profile:  Recent Labs Lab 08/22/16 0319  INR 1.15   Cardiac Enzymes:  Recent Labs Lab 08/20/16 1545 08/21/16 0033 08/21/16 0535  TROPONINI <0.03 <0.03 <0.03   BNP (last 3 results) No results for input(s): PROBNP in the last 8760 hours. HbA1C:  Recent Labs  08/20/16 1545  HGBA1C 6.4*   CBG:  Recent Labs Lab 08/22/16 1259 08/22/16 1649 08/22/16 2158 08/23/16 0801 08/23/16 1315  GLUCAP 330* 326* 261* 183* 183*   Lipid Profile: No results for input(s): CHOL, HDL, LDLCALC, TRIG, CHOLHDL, LDLDIRECT in the last 72 hours. Thyroid Function Tests:  Recent Labs  08/20/16 1545  TSH 1.021   Anemia Panel:  Recent Labs  08/21/16 2035  RETICCTPCT 1.8   Urine analysis:    Component Value Date/Time   COLORURINE YELLOW 08/20/2016 0827   APPEARANCEUR HAZY (A) 08/20/2016 0827   LABSPEC 1.010 08/20/2016 0827   LABSPEC 1.010 08/09/2016 1502   PHURINE 5.0 08/20/2016 0827   GLUCOSEU 50 (A) 08/20/2016 0827   GLUCOSEU Negative 08/09/2016 1502   HGBUR NEGATIVE 08/20/2016 0827   BILIRUBINUR NEGATIVE 08/20/2016 0827   BILIRUBINUR Negative 08/09/2016 1502   KETONESUR NEGATIVE 08/20/2016 0827   PROTEINUR NEGATIVE 08/20/2016 0827   UROBILINOGEN 0.2 08/09/2016 1502   NITRITE NEGATIVE 08/20/2016 0827   LEUKOCYTESUR MODERATE (A) 08/20/2016 0827   LEUKOCYTESUR Negative 08/09/2016 1502   Sepsis Labs: Invalid input(s): PROCALCITONIN, LACTICIDVEN  Recent Results (from the  past 240 hour(s))  Blood Culture (routine x 2)     Status: None (Preliminary result)   Collection Time: 08/20/16  8:25 AM  Result Value Ref Range Status   Specimen Description BLOOD RIGHT WRIST  Final   Special Requests   Final    BOTTLES DRAWN AEROBIC AND ANAEROBIC Blood Culture adequate volume   Culture   Final    NO GROWTH 3 DAYS Performed at [facility], [address], [place], [place] [address]  Report Status PENDING  Incomplete  Blood Culture (routine x 2)     Status: None (Preliminary result)   Collection Time: 08/20/16  8:27 AM  Result Value Ref Range Status   Specimen Description BLOOD LEFT ANTECUBITAL  Final   Special Requests   Final    BOTTLES DRAWN AEROBIC AND ANAEROBIC Blood Culture adequate volume   Culture   Final    NO GROWTH 3 DAYS Performed at [facility], [address] [place], [place] [address]    Report Status PENDING  Incomplete  Urine culture     Status: Abnormal   Collection Time: 08/20/16  8:27 AM  Result Value Ref Range Status   Specimen Description URINE, RANDOM  Final   Special Requests NONE  Final   Culture MULTIPLE SPECIES PRESENT, SUGGEST RECOLLECTION (A)  Final   Report Status 08/21/2016 FINAL  Final  MRSA PCR Screening     Status: Abnormal   Collection Time: 08/21/16  6:05 PM  Result Value Ref Range Status   MRSA by PCR POSITIVE (A) NEGATIVE Final    Comment:        The GeneXpert MRSA Assay (FDA approved for NASAL specimens only), is one component of a comprehensive MRSA colonization surveillance program. It is not intended to diagnose MRSA infection nor to guide or monitor treatment for MRSA infections. RESULT CALLED TO, READ BACK BY AND VERIFIED WITH: SCOTT,B RN 7.23.18 @2106  ZANDO,C       Radiology Studies: Ct Biopsy  Result Date: September 07, 2016 CLINICAL DATA:  Severe anemia and thrombocytopenia. EXAM: CT GUIDED BONE MARROW ASPIRATION AND BIOPSY ANESTHESIA/SEDATION: Versed 1.5 mg IV, Fentanyl 75 mcg IV Total  Moderate Sedation Time:  16 minutes. The patient's level of consciousness and physiologic status were continuously monitored during the procedure by Radiology nursing. PROCEDURE: The procedure risks, benefits, and alternatives were explained to the patient. Questions regarding the procedure were encouraged and answered. The patient understands and consents to the procedure. A time out was performed prior to initiating the procedure. The right gluteal region was prepped with chlorhexidine. Sterile gown and sterile gloves were used for the procedure. Local anesthesia was provided with 1% Lidocaine. Under CT guidance, an 11 gauge On Control bone cutting needle was advanced from a posterior approach into the right iliac bone. Needle positioning was confirmed with CT. Initial non heparinized and heparinized aspirate samples were obtained of bone marrow. Core biopsy was performed via the On Control drill needle. COMPLICATIONS: None FINDINGS: Inspection of initial aspirate did reveal visible particles. Intact core biopsy sample was obtained. IMPRESSION: CT guided bone marrow biopsy of right posterior iliac bone with both aspirate and core samples obtained. Electronically Signed   By: Aletta Edouard M.D.   On: 2016/09/07 12:18   Ct Bone Marrow Biopsy & Aspiration  Result Date: 09/07/2016 CLINICAL DATA:  Severe anemia and thrombocytopenia. EXAM: CT GUIDED BONE MARROW ASPIRATION AND BIOPSY ANESTHESIA/SEDATION: Versed 1.5 mg IV, Fentanyl 75 mcg IV Total Moderate Sedation Time:  16 minutes. The patient's level of consciousness and physiologic status were continuously monitored during the procedure by Radiology nursing. PROCEDURE: The procedure risks, benefits, and alternatives were explained to the patient. Questions regarding the procedure were encouraged and answered. The patient understands and consents to the procedure. A time out was performed prior to initiating the procedure. The right gluteal region was prepped  with chlorhexidine. Sterile gown and sterile gloves were used for the procedure. Local anesthesia was provided with 1% Lidocaine. Under CT guidance, an  11 gauge On Control bone cutting needle was advanced from a posterior approach into the right iliac bone. Needle positioning was confirmed with CT. Initial non heparinized and heparinized aspirate samples were obtained of bone marrow. Core biopsy was performed via the On Control drill needle. COMPLICATIONS: None FINDINGS: Inspection of initial aspirate did reveal visible particles. Intact core biopsy sample was obtained. IMPRESSION: CT guided bone marrow biopsy of right posterior iliac bone with both aspirate and core samples obtained. Electronically Signed   By: Aletta Edouard M.D.   On: 08/23/2016 12:18     Scheduled Meds: . Chlorhexidine Gluconate Cloth  6 each Topical Q0600  . dexamethasone  4 mg Oral BID WC  . famotidine  20 mg Oral BID  . feeding supplement  1 Container Oral BID BM  . fentaNYL      . insulin aspart  0-15 Units Subcutaneous TID WC  . insulin aspart  0-5 Units Subcutaneous QHS  . insulin glargine  17 Units Subcutaneous QHS  . levETIRAcetam  500 mg Oral BID  . midazolam      . multivitamin with minerals  1 tablet Oral Daily  . mupirocin ointment  1 application Nasal BID  . protein supplement shake  11 oz Oral BID BM  . sodium chloride flush  3 mL Intravenous Q12H  . SONAFINE  1 application Topical Daily  . valACYclovir  1,000 mg Oral Daily   Continuous Infusions: . sodium chloride    . sodium chloride    . piperacillin-tazobactam (ZOSYN)  IV Stopped (08/23/16 0901)  . vancomycin 1,000 mg (08/23/16 1333)   Marzetta Board, MD, PhD [facility] Pager [phone]  If 7PM-7AM, please contact night-coverage [email or web address] Password [email or web address] 08/23/2016, 1:46 PM

## 2016-08-24 ENCOUNTER — Other Ambulatory Visit: Payer: Self-pay

## 2016-08-24 DIAGNOSIS — C712 Malignant neoplasm of temporal lobe: Secondary | ICD-10-CM

## 2016-08-24 DIAGNOSIS — D509 Iron deficiency anemia, unspecified: Secondary | ICD-10-CM

## 2016-08-24 LAB — HEPATIC FUNCTION PANEL
ALBUMIN: 3 g/dL — AB (ref 3.5–5.0)
ALK PHOS: 76 U/L (ref 38–126)
ALT: 64 U/L — ABNORMAL HIGH (ref 14–54)
AST: 83 U/L — ABNORMAL HIGH (ref 15–41)
Bilirubin, Direct: 0.2 mg/dL (ref 0.1–0.5)
Indirect Bilirubin: 0.8 mg/dL (ref 0.3–0.9)
TOTAL PROTEIN: 6.3 g/dL — AB (ref 6.5–8.1)
Total Bilirubin: 1 mg/dL (ref 0.3–1.2)

## 2016-08-24 LAB — GLUCOSE, CAPILLARY
GLUCOSE-CAPILLARY: 286 mg/dL — AB (ref 65–99)
GLUCOSE-CAPILLARY: 402 mg/dL — AB (ref 65–99)
Glucose-Capillary: 185 mg/dL — ABNORMAL HIGH (ref 65–99)
Glucose-Capillary: 302 mg/dL — ABNORMAL HIGH (ref 65–99)

## 2016-08-24 LAB — CBC
HEMATOCRIT: 27.7 % — AB (ref 36.0–46.0)
HEMOGLOBIN: 9.3 g/dL — AB (ref 12.0–15.0)
MCH: 28.5 pg (ref 26.0–34.0)
MCHC: 33.6 g/dL (ref 30.0–36.0)
MCV: 85 fL (ref 78.0–100.0)
Platelets: 90 10*3/uL — ABNORMAL LOW (ref 150–400)
RBC: 3.26 MIL/uL — AB (ref 3.87–5.11)
RDW: 16.6 % — ABNORMAL HIGH (ref 11.5–15.5)
WBC: 2.9 10*3/uL — AB (ref 4.0–10.5)

## 2016-08-24 LAB — LACTATE DEHYDROGENASE: LDH: 419 U/L — AB (ref 98–192)

## 2016-08-24 LAB — BASIC METABOLIC PANEL
ANION GAP: 8 (ref 5–15)
BUN: 14 mg/dL (ref 6–20)
CHLORIDE: 103 mmol/L (ref 101–111)
CO2: 27 mmol/L (ref 22–32)
Calcium: 8.7 mg/dL — ABNORMAL LOW (ref 8.9–10.3)
Creatinine, Ser: 0.41 mg/dL — ABNORMAL LOW (ref 0.44–1.00)
GFR calc Af Amer: >60 mL/min (ref >60)
GLUCOSE: 258 mg/dL — AB (ref 65–99)
POTASSIUM: 3 mmol/L — AB (ref 3.5–5.1)
Sodium: 138 mmol/L (ref 135–145)

## 2016-08-24 MED ORDER — DEXAMETHASONE 4 MG PO TABS
4.0000 mg | ORAL_TABLET | Freq: Every day | ORAL | Status: DC
  Administered 2016-08-25 – 2016-08-26 (×2): 4 mg via ORAL
  Filled 2016-08-24 (×2): qty 1

## 2016-08-24 MED ORDER — POTASSIUM CHLORIDE CRYS ER 20 MEQ PO TBCR
40.0000 meq | EXTENDED_RELEASE_TABLET | ORAL | Status: AC
  Administered 2016-08-24 (×2): 40 meq via ORAL
  Filled 2016-08-24 (×2): qty 2

## 2016-08-24 NOTE — Progress Notes (Signed)
IP PROGRESS NOTE  Subjective:   Jaime [name] nausea and headache. She feels well. She was able to ambulate with physical therapy.    Objective: Vital signs in last 24 hours: Blood pressure 100/69, pulse 87, temperature 98.6 F (37 C), temperature source Oral, resp. rate 18, height 5' 2.5" (1.588 m), weight 155 lb 9.6 oz (70.6 kg), last menstrual period 10/31/2005, SpO2 98 %.  Intake/Output from previous day: 07/25 0701 - 07/26 0700 In: 500 [IV Piggyback:500] Out: 2850 [Urine:2850]  Physical Exam: HEENT: Sclera anicteric Lungs: Clear bilaterally Cardiac: Regular rate and rhythm Abdomen: No hepatomegaly, no mass, nontender Extremities: No leg edema    Lab Results:  Recent Labs  08/23/16 1119 08/24/16 0426  WBC 3.9* 2.9*  HGB 9.7* 9.3*  HCT 28.0* 27.7*  PLT 89* 90*  Blood smear 08/22/2016: The platelets appear normal in number, large platelets are present. There are a few band neutrophils. No blasts. Rare nucleated red cell. A few ovalocytes and rare teardrop. Mild increase in macrocytic polychromatophilic cells. No schistocytes.  BMET  Recent Labs  08/23/16 0357 08/24/16 0426  NA 139 138  K 3.8 3.0*  CL 104 103  CO2 27 27  GLUCOSE 250* 258*  BUN 22* 14  CREATININE 0.51 0.41*  CALCIUM 9.1 8.7*  08/21/2016-bilirubin 1.7, indirect bilirubin 1.4, LDH 398 Reticulocyte count 43.4 PT 14.7, PTT 27 Studies/Results: Ct Biopsy  Result Date: 08/23/2016 CLINICAL DATA:  Severe anemia and thrombocytopenia. EXAM: CT GUIDED BONE MARROW ASPIRATION AND BIOPSY ANESTHESIA/SEDATION: Versed 1.5 mg IV, Fentanyl 75 mcg IV Total Moderate Sedation Time:  16 minutes. The patient's level of consciousness and physiologic status were continuously monitored during the procedure by Radiology nursing. PROCEDURE: The procedure risks, benefits, and alternatives were explained to the patient. Questions regarding the procedure were encouraged and answered. The patient understands and consents to the  procedure. A time out was performed prior to initiating the procedure. The right gluteal region was prepped with chlorhexidine. Sterile gown and sterile gloves were used for the procedure. Local anesthesia was provided with 1% Lidocaine. Under CT guidance, an 11 gauge On Control bone cutting needle was advanced from a posterior approach into the right iliac bone. Needle positioning was confirmed with CT. Initial non heparinized and heparinized aspirate samples were obtained of bone marrow. Core biopsy was performed via the On Control drill needle. COMPLICATIONS: None FINDINGS: Inspection of initial aspirate did reveal visible particles. Intact core biopsy sample was obtained. IMPRESSION: CT guided bone marrow biopsy of right posterior iliac bone with both aspirate and core samples obtained. Electronically Signed   By: Aletta Edouard M.D.   On: 08/23/2016 12:18   Ct Bone Marrow Biopsy & Aspiration  Result Date: 08/23/2016 CLINICAL DATA:  Severe anemia and thrombocytopenia. EXAM: CT GUIDED BONE MARROW ASPIRATION AND BIOPSY ANESTHESIA/SEDATION: Versed 1.5 mg IV, Fentanyl 75 mcg IV Total Moderate Sedation Time:  16 minutes. The patient's level of consciousness and physiologic status were continuously monitored during the procedure by Radiology nursing. PROCEDURE: The procedure risks, benefits, and alternatives were explained to the patient. Questions regarding the procedure were encouraged and answered. The patient understands and consents to the procedure. A time out was performed prior to initiating the procedure. The right gluteal region was prepped with chlorhexidine. Sterile gown and sterile gloves were used for the procedure. Local anesthesia was provided with 1% Lidocaine. Under CT guidance, an 11 gauge On Control bone cutting needle was advanced from a posterior approach into the right iliac bone. Needle positioning  was confirmed with CT. Initial non heparinized and heparinized aspirate samples were  obtained of bone marrow. Core biopsy was performed via the On Control drill needle. COMPLICATIONS: None FINDINGS: Inspection of initial aspirate did reveal visible particles. Intact core biopsy sample was obtained. IMPRESSION: CT guided bone marrow biopsy of right posterior iliac bone with both aspirate and core samples obtained. Electronically Signed   By: Aletta Edouard M.D.   On: 08/23/2016 12:18    Medications: I have reviewed the patient's current medications.  Assessment/Plan: 1. History of iron deficiency anemia. Mild normocytic anemia during hospital admission May 2018 2. Sickle cell trait. 3. Status post hysterectomy. 4. History of dysphagia. She underwent an endoscopy by Dr. Collene Mares.  5. Diabetes 6. 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 History of Nausea/vomiting and headaches secondary to #6 8. Dry skin rash both hands. 9. Viral appearing lesions right medial buttock. Status post 7 day course of Valtrex-healed 10. History of Markedly elevated BUN/creatinine-likely related to dehydration versus GI bleeding 11. Severe anemia, mild thrombocytopenia-potentially related to temozolomide/radiation or primary bone marrow process,   She has stable anemia/thrombocytopenia. Results from the bone marrow biopsy 08/23/2016 are pending. The anemia/thrombocytopenia may be related to toxicity from temozolomide. We will follow-up on the bone marrow biopsy results. She was last transfused with packed red blood cells on 08/22/2016.  She can be discharged if the hemoglobin is stable and she does not have recurrent  nausea/vomiting on 08/25/2016. Outpatient follow-up will be scheduled at the [facility] for 08/29/2016.    Recommendations:  1. Follow-up bone marrow biopsy result 2. Continue Decadron 3. Outpatient follow-up at the Cancer center 08/29/2016 4. Please call oncology as needed      LOS: 4 days   Donneta Romberg, MD   08/24/2016, 7:15 AM

## 2016-08-24 NOTE — Progress Notes (Signed)
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   Assessment & Plan: Principal Problem:   Symptomatic anemia Active Problems:   Diabetes mellitus type 2 with retinopathy (HCC)   Glioblastoma multiforme of temporal lobe (HCC)   Dehydration   Acute prerenal azotemia   SIRS (systemic inflammatory response syndrome) (HCC)   Protein-calorie malnutrition, severe   Symptomatic anemia, pancytopenia, ? Hemolysis  -Hg up post 2 U transfusion: 4.9 --> 7.7, down to 6.6 on 7/24, transfused 2 additional units, improved appropriately to 9.7 on 7/25, 9.3 today  -Continues to have leukopenia as well as thrombocytopenia -no clear evidence of bleeding, slight evidence of hemolysis -Oncology following, discussed with Dr. Benay Spice this morning, she is status post bone marrow biopsy 7/25  SIRS secondary to acute illness pancytopenia, acute blood loss anemia, unlikely an infectious etiology  -unclear etiology -blood and urine cultures final report still pending, no growth at 4 days -on vanc and zosyn day #5, with cultures negative will discontinue antibiotics today monitor off of antibiotics  Diabetes mellitus type 2 with retinopathy ([place]) -Continue Lantus and sliding scale, CBGs 180s-250s -CBGs continue to remain high, will decrease the steroid dose  Glioblastoma multiforme of temporal lobe (HCC) -mental status clear this AM, outpatient follow-up with oncology  Dehydration, tachycardia -from poor oral intake  -overall improving with IVF   Hypokalemia -needs supplementation again today, potassium 3.0.  Hyponatremia  -resolved with IVF sodium, within normal limits the last couple of days   DVT prophylaxis: SCD Code Status:  DNR Family Communication: mother at bedside Disposition Plan: home when ready, likely 1 day  Consultants:   Oncology  IR  Procedures:   BM biopsy 7/25  Antimicrobials:  Vanc 7/22 --> 7/26  Zosyn 7/22 --> 7/26  Subjective: -States that she is feeling pretty good today, no chest pain, shortness of breath.  No lightheadedness or dizziness.  Denies any fever or chills.  Objective: Vitals:   08/23/16 1433 08/23/16 1700 08/23/16 2216 08/24/16 0559  BP: (!) 115/46  94/66 100/69  Pulse: 68  (!) 104 87  Resp: 16  16 18   Temp: 97.9 F (36.6 C)  98.4 F (36.9 C) 98.6 F (37 C)  TempSrc: Oral  Oral Oral  SpO2:  98% 100% 98%  Weight:      Height:        Intake/Output Summary (Last 24 hours) at 08/24/16 1138 Last data filed at 08/24/16 0600  Gross per 24 hour  Intake              500 ml  Output             2850 ml  Net            -2350 ml   Filed Weights   08/20/16 0803 08/20/16 0834 08/23/16 0007  Weight: 65.8 kg (145 lb) 64.4 kg (142 lb) 70.6 kg (155 lb 9.6 oz)    Examination:  Vitals:   08/23/16 1433 08/23/16 1700 08/23/16 2216 08/24/16 0559  BP: (!) 115/46  94/66 100/69  Pulse: 68  (!) 104 87  Resp: 16  16 18   Temp: 97.9 F (36.6 C)  98.4 F (36.9 C) 98.6 F (37 C)  TempSrc: Oral  Oral Oral  SpO2:  98% 100% 98%  Weight:      Height:       Constitutional: NAD, calm, comfortable Eyes: lids and conjunctivae normal Respiratory: clear to auscultation bilaterally, no wheezing, no crackles. Normal respiratory effort.  Cardiovascular: Regular rate and rhythm, no murmurs / rubs / gallops. No LE edema. 2+ pedal pulses.  Abdomen: no tenderness. Bowel sounds positive.  Skin: no rashes, lesions, ulcers. No induration Neurologic: non focal  Data Reviewed: I have independently reviewed following labs and imaging studies  CBC:  Recent Labs Lab 08/17/16 1331  08/20/16 0828 08/21/16 0535 08/22/16 0319 08/22/16 1928 08/23/16 1119 08/24/16 0426  WBC 4.1  --   3.0* 3.2* 3.3*  --  3.9* 2.9*  NEUTROABS 2.8  --  1.8  --   --   --  2.6  --   HGB 8.9*  < > 4.9* 7.7* 6.6* 10.4* 9.7* 9.3*  HCT 27.3*  < > 14.5* 22.0* 18.7* 29.7* 28.0* 27.7*  MCV 88.1  --  86.3 82.1 82.4  --  85.1 85.0  PLT 86*  --  106* 100* 90*  91*  --  89* 90*  < > = values in this interval not displayed. Basic Metabolic Panel:  Recent Labs Lab 08/20/16 0828 08/21/16 0535 08/22/16 0319 08/23/16 0357 08/24/16 0426  NA 133* 139 139 139 138  K 3.2* 3.1* 4.1 3.8 3.0*  CL 97* 105 106 104 103  CO2 27 28 25 27 27   GLUCOSE 198* 227* 269* 250* 258*  BUN 30* 18 15 22* 14  CREATININE 0.60 0.35* 0.42* 0.51 0.41*  CALCIUM 8.6* 8.8* 8.6* 9.1 8.7*   GFR: Estimated Creatinine Clearance: 79.2 mL/min (A) (by C-G formula based on SCr of 0.41 mg/dL (L)). Liver Function Tests:  Recent Labs Lab 08/20/16 0828 08/21/16 2035 08/23/16 0357  AST 31  --  28  ALT 23  --  31  ALKPHOS 53  --  61  BILITOT 2.8* 1.7* 1.3*  PROT 5.9*  --  5.9*  ALBUMIN 2.7*  --  2.7*   No results for input(s): LIPASE, AMYLASE in the last 168 hours. No results for input(s): AMMONIA in the last 168 hours. Coagulation Profile:  Recent Labs Lab 08/22/16 0319  INR 1.15   Cardiac Enzymes:  Recent Labs Lab 08/20/16 1545 08/21/16 0033 08/21/16 0535  TROPONINI <0.03 <0.03 <0.03   BNP (last 3 results) No results for input(s): PROBNP in the last 8760 hours. HbA1C: No results for input(s): HGBA1C in the last 72 hours. CBG:  Recent Labs Lab 08/23/16 0801 08/23/16 1315 08/23/16 1659 08/23/16 2031 08/24/16 0735  GLUCAP 183* 183* 405* 470* 185*   Lipid Profile: No results for input(s): CHOL, HDL, LDLCALC, TRIG, CHOLHDL, LDLDIRECT in the last 72 hours. Thyroid Function Tests: No results for input(s): TSH, T4TOTAL, FREET4, T3FREE, THYROIDAB in the last 72 hours. Anemia Panel:  Recent Labs  08/21/16 2035  RETICCTPCT 1.8   Urine analysis:    Component Value Date/Time   COLORURINE YELLOW  08/20/2016 0827   APPEARANCEUR HAZY (A) 08/20/2016 0827   LABSPEC 1.010 08/20/2016 0827   LABSPEC 1.010 08/09/2016 1502   PHURINE 5.0 08/20/2016 0827   GLUCOSEU 50 (A) 08/20/2016 0827   GLUCOSEU Negative 08/09/2016 1502   HGBUR NEGATIVE 08/20/2016 0827   BILIRUBINUR NEGATIVE 08/20/2016 0827   BILIRUBINUR Negative 08/09/2016 1502   KETONESUR NEGATIVE 08/20/2016 0827   PROTEINUR NEGATIVE 08/20/2016 0827   UROBILINOGEN 0.2 08/09/2016 1502   NITRITE NEGATIVE 08/20/2016 0827   LEUKOCYTESUR  MODERATE (A) 08/20/2016 0827   LEUKOCYTESUR Negative 08/09/2016 1502   Sepsis Labs: Invalid input(s): PROCALCITONIN, LACTICIDVEN  Recent Results (from the past 240 hour(s))  Blood Culture (routine x 2)     Status: None (Preliminary result)   Collection Time: 08/20/16  8:25 AM  Result Value Ref Range Status   Specimen Description BLOOD RIGHT WRIST  Final   Special Requests   Final    BOTTLES DRAWN AEROBIC AND ANAEROBIC Blood Culture adequate volume   Culture   Final    NO GROWTH 3 DAYS Performed at [facility], [address], [place], [place] [address]    Report Status PENDING  Incomplete  Blood Culture (routine x 2)     Status: None (Preliminary result)   Collection Time: 08/20/16  8:27 AM  Result Value Ref Range Status   Specimen Description BLOOD LEFT ANTECUBITAL  Final   Special Requests   Final    BOTTLES DRAWN AEROBIC AND ANAEROBIC Blood Culture adequate volume   Culture   Final    NO GROWTH 3 DAYS Performed at [facility], [place] [address] [place],  [address]    Report Status PENDING  Incomplete  Urine culture     Status: Abnormal   Collection Time: 08/20/16  8:27 AM  Result Value Ref Range Status   Specimen Description URINE, RANDOM  Final   Special Requests NONE  Final   Culture MULTIPLE SPECIES PRESENT, SUGGEST RECOLLECTION (A)  Final   Report Status 08/21/2016 FINAL  Final  MRSA PCR Screening     Status: Abnormal   Collection Time: 08/21/16  6:05 PM   Result Value Ref Range Status   MRSA by PCR POSITIVE (A) NEGATIVE Final    Comment:        The GeneXpert MRSA Assay (FDA approved for NASAL specimens only), is one component of a comprehensive MRSA colonization surveillance program. It is not intended to diagnose MRSA infection nor to guide or monitor treatment for MRSA infections. RESULT CALLED TO, READ BACK BY AND VERIFIED WITH: SCOTT,B RN 7.23.18 @2106  ZANDO,C       Radiology Studies: Ct Biopsy  Result Date: 09-09-2016 CLINICAL DATA:  Severe anemia and thrombocytopenia. EXAM: CT GUIDED BONE MARROW ASPIRATION AND BIOPSY ANESTHESIA/SEDATION: Versed 1.5 mg IV, Fentanyl 75 mcg IV Total Moderate Sedation Time:  16 minutes. The patient's level of consciousness and physiologic status were continuously monitored during the procedure by Radiology nursing. PROCEDURE: The procedure risks, benefits, and alternatives were explained to the patient. Questions regarding the procedure were encouraged and answered. The patient understands and consents to the procedure. A time out was performed prior to initiating the procedure. The right gluteal region was prepped with chlorhexidine. Sterile gown and sterile gloves were used for the procedure. Local anesthesia was provided with 1% Lidocaine. Under CT guidance, an 11 gauge On Control bone cutting needle was advanced from a posterior approach into the right iliac bone. Needle positioning was confirmed with CT. Initial non heparinized and heparinized aspirate samples were obtained of bone marrow. Core biopsy was performed via the On Control drill needle. COMPLICATIONS: None FINDINGS: Inspection of initial aspirate did reveal visible particles. Intact core biopsy sample was obtained. IMPRESSION: CT guided bone marrow biopsy of right posterior iliac bone with both aspirate and core samples obtained. Electronically Signed   By: Aletta Edouard M.D.   On: 09-Sep-2016 12:18   Ct Bone Marrow Biopsy &  Aspiration  Result Date: September 09, 2016 CLINICAL DATA:  Severe  anemia and thrombocytopenia. EXAM: CT GUIDED BONE MARROW ASPIRATION AND BIOPSY ANESTHESIA/SEDATION: Versed 1.5 mg IV, Fentanyl 75 mcg IV Total Moderate Sedation Time:  16 minutes. The patient's level of consciousness and physiologic status were continuously monitored during the procedure by Radiology nursing. PROCEDURE: The procedure risks, benefits, and alternatives were explained to the patient. Questions regarding the procedure were encouraged and answered. The patient understands and consents to the procedure. A time out was performed prior to initiating the procedure. The right gluteal region was prepped with chlorhexidine. Sterile gown and sterile gloves were used for the procedure. Local anesthesia was provided with 1% Lidocaine. Under CT guidance, an 11 gauge On Control bone cutting needle was advanced from a posterior approach into the right iliac bone. Needle positioning was confirmed with CT. Initial non heparinized and heparinized aspirate samples were obtained of bone marrow. Core biopsy was performed via the On Control drill needle. COMPLICATIONS: None FINDINGS: Inspection of initial aspirate did reveal visible particles. Intact core biopsy sample was obtained. IMPRESSION: CT guided bone marrow biopsy of right posterior iliac bone with both aspirate and core samples obtained. Electronically Signed   By: Aletta Edouard M.D.   On: 08/23/2016 12:18     Scheduled Meds: . Chlorhexidine Gluconate Cloth  6 each Topical Q0600  . dexamethasone  4 mg Oral BID WC  . famotidine  20 mg Oral BID  . feeding supplement  1 Container Oral BID BM  . insulin aspart  0-15 Units Subcutaneous TID WC  . insulin aspart  0-5 Units Subcutaneous QHS  . insulin glargine  17 Units Subcutaneous QHS  . levETIRAcetam  500 mg Oral BID  . multivitamin with minerals  1 tablet Oral Daily  . mupirocin ointment  1 application Nasal BID  . protein supplement shake   11 oz Oral BID BM  . sodium chloride flush  3 mL Intravenous Q12H  . SONAFINE  1 application Topical Daily  . valACYclovir  1,000 mg Oral Daily   Continuous Infusions: . sodium chloride    . sodium chloride     Marzetta Board, MD, PhD [facility] Pager [phone]  If 7PM-7AM, please contact night-coverage [email or web address] Password [facility] 08/24/2016, 11:38 AM

## 2016-08-24 NOTE — Progress Notes (Signed)
Pt will discharge home with [facility]. Referral given to in house rep.

## 2016-08-24 NOTE — Progress Notes (Signed)
Physical Therapy Treatment Patient Details Name: Jaime [name] MRN: [id number] DOB: 21-Jun-1965 Today's Date: 08/24/2016    History of Present Illness Pt is a 51 y.o. female s/p chemotherapy for a high-grade glioma. Pt presented to the hospital after several days of generalized weakness. Pt admitted for symptomatic anemia.    PT Comments    Pt tolerated treatment session well today. During session pt's HR elevated to 140 BPM so pt was returned to the room after 200 feet. RN notified of elevated HR. Pt continues to drift towards the right during ambulation.  Follow Up Recommendations  Home health PT;Supervision - Intermittent     Equipment Recommendations  Rolling walker with 5" wheels    Recommendations for Other Services       Precautions / Restrictions Precautions Precautions: Fall Precaution Comments: Monitor HR  Restrictions Weight Bearing Restrictions: No    Mobility  Bed Mobility Overal bed mobility: Needs Assistance Bed Mobility: Supine to Sit     Supine to sit: Supervision     General bed mobility comments: Supervision provided for safety, pt able to turn over and sit at EOB  Transfers Overall transfer level: Needs assistance   Transfers: Sit to/from Stand Sit to Stand: Min guard         General transfer comment: Pt able to rise and steady on her own, min/guard provided for safety   Ambulation/Gait Ambulation/Gait assistance: Min assist Ambulation Distance (Feet): 200 Feet Assistive device: None Gait Pattern/deviations: Step-through pattern;Drifts right/left;Decreased stride length Gait velocity: decr   General Gait Details: Pt able to ambulate without assistive device, pt continually drifted right throughout session, Pt's HR elevated to 140 bpm during session so pt was returned to her room   Stairs            Wheelchair Mobility    Modified Rankin (Stroke Patients Only)       Balance Overall balance assessment: Needs assistance    Sitting balance-Leahy Scale: Good       Standing balance-Leahy Scale: Good                 High Level Balance Comments: Pt continually drifts towards the right while ambulating             Cognition Arousal/Alertness: Awake/alert Behavior During Therapy: WFL for tasks assessed/performed Overall Cognitive Status: History of cognitive impairments - at baseline                                 General Comments: Pt appropriate during session      Exercises      General Comments        Pertinent Vitals/Pain Pain Assessment: No/denies pain    Home Living                      Prior Function            PT Goals (current goals can now be found in the care plan section) Acute Rehab PT Goals PT Goal Formulation: With patient Time For Goal Achievement: 09/06/16 Potential to Achieve Goals: Good Progress towards PT goals: Progressing toward goals    Frequency    Min 3X/week      PT Plan Current plan remains appropriate    Co-evaluation              AM-PAC PT "6 Clicks" Daily Activity  Outcome Measure  Difficulty turning over in  bed (including adjusting bedclothes, sheets and blankets)?: A Little Difficulty moving from lying on back to sitting on the side of the bed? : A Little Difficulty sitting down on and standing up from a chair with arms (e.g., wheelchair, bedside commode, etc,.)?: A Little Help needed moving to and from a bed to chair (including a wheelchair)?: A Little Help needed walking in hospital room?: A Little Help needed climbing 3-5 steps with a railing? : A Little 6 Click Score: 18    End of Session Equipment Utilized During Treatment: Gait belt Activity Tolerance: Patient tolerated treatment well Patient left: in chair;with call bell/phone within reach;with chair alarm set Nurse Communication: Mobility status;Other (comment) (Nurse was informed of elevated HR during session) PT Visit Diagnosis: Difficulty  in walking, not elsewhere classified (R26.2)     Time: 9774-1423 PT Time Calculation (min) (ACUTE ONLY): 14 min  Charges:  $Gait Training: 8-22 mins                    G Codes:       Olegario Shearer, SPT   Reino Bellis 08/24/2016, 2:55 PM

## 2016-08-24 NOTE — Progress Notes (Signed)
Inpatient Diabetes Program Recommendations  AACE/ADA: New Consensus Statement on Inpatient Glycemic Control (2015)  Target Ranges:  Prepandial:   less than 140 mg/dL      Peak postprandial:   less than 180 mg/dL (1-2 hours)      Critically ill patients:  140 - 180 mg/dL   Lab Results  Component Value Date   GLUCAP 185 (H) 08/24/2016   HGBA1C 6.4 (H) 08/20/2016    Review of Glycemic Control  FBS and post-prandial blood sugars > 180 mg/dL. Needs insulin adjustment.  Inpatient Diabetes Program Recommendations:    Increase Lantus to 20 units QHS Add Novolog 3 units tidwc for meal coverage insulin while on steroids.  Will follow.   Thank you. Lorenda Peck, RD, LDN, CDE Inpatient Diabetes Coordinator [phone]

## 2016-08-25 DIAGNOSIS — E860 Dehydration: Secondary | ICD-10-CM

## 2016-08-25 LAB — BASIC METABOLIC PANEL
ANION GAP: 9 (ref 5–15)
BUN: 12 mg/dL (ref 6–20)
CALCIUM: 8.8 mg/dL — AB (ref 8.9–10.3)
CHLORIDE: 105 mmol/L (ref 101–111)
CO2: 25 mmol/L (ref 22–32)
CREATININE: 0.38 mg/dL — AB (ref 0.44–1.00)
GFR calc non Af Amer: >60 mL/min (ref >60)
GLUCOSE: 251 mg/dL — AB (ref 65–99)
Potassium: 3.1 mmol/L — ABNORMAL LOW (ref 3.5–5.1)
Sodium: 139 mmol/L (ref 135–145)

## 2016-08-25 LAB — CBC
HCT: 28 % — ABNORMAL LOW (ref 36.0–46.0)
HEMOGLOBIN: 9.4 g/dL — AB (ref 12.0–15.0)
MCH: 29.8 pg (ref 26.0–34.0)
MCHC: 33.6 g/dL (ref 30.0–36.0)
MCV: 88.9 fL (ref 78.0–100.0)
Platelets: 95 10*3/uL — ABNORMAL LOW (ref 150–400)
RBC: 3.15 MIL/uL — AB (ref 3.87–5.11)
RDW: 17.2 % — ABNORMAL HIGH (ref 11.5–15.5)
WBC: 2.5 10*3/uL — ABNORMAL LOW (ref 4.0–10.5)

## 2016-08-25 LAB — GLUCOSE, CAPILLARY
GLUCOSE-CAPILLARY: 321 mg/dL — AB (ref 65–99)
Glucose-Capillary: 195 mg/dL — ABNORMAL HIGH (ref 65–99)
Glucose-Capillary: 252 mg/dL — ABNORMAL HIGH (ref 65–99)
Glucose-Capillary: 326 mg/dL — ABNORMAL HIGH (ref 65–99)

## 2016-08-25 LAB — CULTURE, BLOOD (ROUTINE X 2)
CULTURE: NO GROWTH
Culture: NO GROWTH
Special Requests: ADEQUATE
Special Requests: ADEQUATE

## 2016-08-25 MED ORDER — HYDROCODONE-ACETAMINOPHEN 5-325 MG PO TABS
1.0000 | ORAL_TABLET | Freq: Once | ORAL | Status: AC
  Administered 2016-08-25: 1 via ORAL
  Filled 2016-08-25: qty 1

## 2016-08-25 MED ORDER — POTASSIUM CHLORIDE CRYS ER 20 MEQ PO TBCR
40.0000 meq | EXTENDED_RELEASE_TABLET | Freq: Two times a day (BID) | ORAL | Status: AC
  Administered 2016-08-25 (×2): 40 meq via ORAL
  Filled 2016-08-25 (×2): qty 2

## 2016-08-25 MED ORDER — SODIUM CHLORIDE 0.9 % IV SOLN
INTRAVENOUS | Status: DC
  Administered 2016-08-25 (×2): via INTRAVENOUS

## 2016-08-25 NOTE — Progress Notes (Signed)
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   Assessment & Plan: Principal Problem:   Symptomatic anemia Active Problems:   Diabetes mellitus type 2 with retinopathy (HCC)   Glioblastoma multiforme of temporal lobe (HCC)   Dehydration   Acute prerenal azotemia   SIRS (systemic inflammatory response syndrome) (HCC)   Protein-calorie malnutrition, severe   Symptomatic anemia, pancytopenia, ? Hemolysis  -Hg up post 2 U transfusion: 4.9 --> 7.7, down to 6.6 on 7/24, transfused 2 additional units, improved appropriately to 9.7 on 7/25, 9.3 >> 9.4 today, seems to be stable, no bleeding -Continues to have leukopenia as well as thrombocytopenia, overall stable -no clear evidence of bleeding, slight evidence of hemolysis -Culture following, she is status post bone marrow biopsy on 7/25, which showed NORMOCELLULAR BONE MARROW FOR AGE WITH TRILINEAGE HEMATOPOIESIS.  SIRS secondary to acute illness pancytopenia, acute blood loss anemia, unlikely an infectious etiology  -unclear etiology -blood and urine cultures final report still pending, no growth at 4 days -She was on vancomycin and Zosyn for 5 days, cultures have remained negative and antibiotics were discontinued on 7/26   Diabetes mellitus type 2 with retinopathy (Echo) -Continue Lantus and sliding scale -Fasting CBG 195 this morning, continue current regimen on Decadron decreased on 7/26  Glioblastoma multiforme of temporal lobe (HCC) -mental status clear this AM, outpatient follow-up with oncology -Complains of a severe headache this morning, unrelieved with Tylenol, will escalate to narcotics  Dehydration, tachycardia -from poor oral intake  -Patient's blood pressure on  the low side, she has slight tachycardia this morning, provide IV fluids for now  Hypokalemia -needs supplementation again today, potassium 3.1 -Recheck in a.m.  Hyponatremia  -resolved with IVF sodium, within normal limits the last couple of days   DVT prophylaxis: SCD Code Status: DNR Family Communication: mother at bedside Disposition Plan: home when ready  Consultants:   Oncology  IR  Procedures:   BM biopsy 7/25  Antimicrobials:  Vanc 7/22 --> 7/26  Zosyn 7/22 --> 7/26  Subjective: -Complains of a severe headache, no chest pain, shortness of breath, no abdominal pain, nausea vomiting  Objective: Vitals:   08/24/16 1709 08/24/16 1906 08/25/16 0646 08/25/16 0659  BP:  107/69 98/65   Pulse: 98 (!) 110 (!) 108 (!) 101  Resp:  18 18   Temp:  98.2 F (36.8 C) 99.1 F (37.3 C)   TempSrc:  Oral Oral   SpO2:  98% 97%   Weight:      Height:        Intake/Output Summary (Last 24 hours) at 08/25/16 1233 Last data filed at 08/25/16 0645  Gross per 24 hour  Intake              240 ml  Output             3050 ml  Net            -2810 ml   Filed Weights   08/20/16 0803 08/20/16 0834 08/23/16 0007  Weight: 65.8 kg (145 lb) 64.4 kg (142 lb) 70.6 kg (155 lb 9.6 oz)    Examination:  Vitals:   08/24/16 1709 08/24/16 1906 08/25/16 0646 08/25/16 0659  BP:  107/69 98/65   Pulse: 98 (!)  110 (!) 108 (!) 101  Resp:  18 18   Temp:  98.2 F (36.8 C) 99.1 F (37.3 C)   TempSrc:  Oral Oral   SpO2:  98% 97%   Weight:      Height:       Constitutional: NAD Eyes: lids and conjunctivae normal Respiratory: clear to auscultation bilaterally, no wheezing, no crackles. Normal respiratory effort.  Cardiovascular: Regular rate and rhythm, no murmurs / rubs / gallops. No LE edema. 2+ pedal pulses.  Abdomen: no tenderness. Bowel sounds positive.  Skin: no rashes, lesions, ulcers. No induration Neurologic: non focal   Data Reviewed: I have independently reviewed  following labs and imaging studies  CBC:  Recent Labs Lab 08/20/16 0828 08/21/16 0535 08/22/16 0319 08/22/16 1928 08/23/16 1119 08/24/16 0426 08/25/16 0929  WBC 3.0* 3.2* 3.3*  --  3.9* 2.9* 2.5*  NEUTROABS 1.8  --   --   --  2.6  --   --   HGB 4.9* 7.7* 6.6* 10.4* 9.7* 9.3* 9.4*  HCT 14.5* 22.0* 18.7* 29.7* 28.0* 27.7* 28.0*  MCV 86.3 82.1 82.4  --  85.1 85.0 88.9  PLT 106* 100* 90*  91*  --  89* 90* 95*   Basic Metabolic Panel:  Recent Labs Lab 08/21/16 0535 08/22/16 0319 08/23/16 0357 08/24/16 0426 08/25/16 0929  NA 139 139 139 138 139  K 3.1* 4.1 3.8 3.0* 3.1*  CL 105 106 104 103 105  CO2 28 25 27 27 25   GLUCOSE 227* 269* 250* 258* 251*  BUN 18 15 22* 14 12  CREATININE 0.35* 0.42* 0.51 0.41* 0.38*  CALCIUM 8.8* 8.6* 9.1 8.7* 8.8*   GFR: Estimated Creatinine Clearance: 79.2 mL/min (A) (by C-G formula based on SCr of 0.38 mg/dL (L)). Liver Function Tests:  Recent Labs Lab 08/20/16 0828 08/21/16 2035 08/23/16 0357 08/24/16 1455  AST 31  --  28 83*  ALT 23  --  31 64*  ALKPHOS 53  --  61 76  BILITOT 2.8* 1.7* 1.3* 1.0  PROT 5.9*  --  5.9* 6.3*  ALBUMIN 2.7*  --  2.7* 3.0*   No results for input(s): LIPASE, AMYLASE in the last 168 hours. No results for input(s): AMMONIA in the last 168 hours. Coagulation Profile:  Recent Labs Lab 08/22/16 0319  INR 1.15   Cardiac Enzymes:  Recent Labs Lab 08/20/16 1545 08/21/16 0033 08/21/16 0535  TROPONINI <0.03 <0.03 <0.03   BNP (last 3 results) No results for input(s): PROBNP in the last 8760 hours. HbA1C: No results for input(s): HGBA1C in the last 72 hours. CBG:  Recent Labs Lab 08/24/16 0735 08/24/16 1206 08/24/16 1646 08/24/16 2038 08/25/16 0753  GLUCAP 185* 302* 286* 402* 195*   Lipid Profile: No results for input(s): CHOL, HDL, LDLCALC, TRIG, CHOLHDL, LDLDIRECT in the last 72 hours. Thyroid Function Tests: No results for input(s): TSH, T4TOTAL, FREET4, T3FREE, THYROIDAB in the last 72  hours. Anemia Panel: No results for input(s): VITAMINB12, FOLATE, FERRITIN, TIBC, IRON, RETICCTPCT in the last 72 hours. Urine analysis:    Component Value Date/Time   COLORURINE YELLOW 08/20/2016 0827   APPEARANCEUR HAZY (A) 08/20/2016 0827   LABSPEC 1.010 08/20/2016 0827   LABSPEC 1.010 08/09/2016 1502   PHURINE 5.0 08/20/2016 0827   GLUCOSEU 50 (A) 08/20/2016 0827   GLUCOSEU Negative 08/09/2016 1502   HGBUR NEGATIVE 08/20/2016 0827   BILIRUBINUR NEGATIVE 08/20/2016 0827   BILIRUBINUR Negative 08/09/2016 [place] 08/20/2016 0827   PROTEINUR  NEGATIVE 08/20/2016 0827   UROBILINOGEN 0.2 08/09/2016 1502   NITRITE NEGATIVE 08/20/2016 0827   LEUKOCYTESUR MODERATE (A) 08/20/2016 0827   LEUKOCYTESUR Negative 08/09/2016 1502   Sepsis Labs: Invalid input(s): PROCALCITONIN, LACTICIDVEN  Recent Results (from the past 240 hour(s))  Blood Culture (routine x 2)     Status: None (Preliminary result)   Collection Time: 08/20/16  8:25 AM  Result Value Ref Range Status   Specimen Description BLOOD RIGHT WRIST  Final   Special Requests   Final    BOTTLES DRAWN AEROBIC AND ANAEROBIC Blood Culture adequate volume   Culture   Final    NO GROWTH 4 DAYS Performed at [facility], [address] [place], [place] [address]    Report Status PENDING  Incomplete  Blood Culture (routine x 2)     Status: None (Preliminary result)   Collection Time: 08/20/16  8:27 AM  Result Value Ref Range Status   Specimen Description BLOOD LEFT ANTECUBITAL  Final   Special Requests   Final    BOTTLES DRAWN AEROBIC AND ANAEROBIC Blood Culture adequate volume   Culture   Final    NO GROWTH 4 DAYS Performed at [facility], [place] [address], [place], [place] [address]    Report Status PENDING  Incomplete  Urine culture     Status: Abnormal   Collection Time: 08/20/16  8:27 AM  Result Value Ref Range Status   Specimen Description URINE, RANDOM  Final   Special Requests NONE  Final    Culture MULTIPLE SPECIES PRESENT, SUGGEST RECOLLECTION (A)  Final   Report Status 08/21/2016 FINAL  Final  MRSA PCR Screening     Status: Abnormal   Collection Time: 08/21/16  6:05 PM  Result Value Ref Range Status   MRSA by PCR POSITIVE (A) NEGATIVE Final    Comment:        The GeneXpert MRSA Assay (FDA approved for NASAL specimens only), is one component of a comprehensive MRSA colonization surveillance program. It is not intended to diagnose MRSA infection nor to guide or monitor treatment for MRSA infections. RESULT CALLED TO, READ BACK BY AND VERIFIED WITH: SCOTT,B RN 7.23.18 @2106  ZANDO,C       Radiology Studies: No results found.   Scheduled Meds: . Chlorhexidine Gluconate Cloth  6 each Topical Q0600  . dexamethasone  4 mg Oral Daily  . famotidine  20 mg Oral BID  . feeding supplement  1 Container Oral BID BM  . insulin aspart  0-15 Units Subcutaneous TID WC  . insulin aspart  0-5 Units Subcutaneous QHS  . insulin glargine  17 Units Subcutaneous QHS  . levETIRAcetam  500 mg Oral BID  . multivitamin with minerals  1 tablet Oral Daily  . mupirocin ointment  1 application Nasal BID  . protein supplement shake  11 oz Oral BID BM  . sodium chloride flush  3 mL Intravenous Q12H  . SONAFINE  1 application Topical Daily  . valACYclovir  1,000 mg Oral Daily   Continuous Infusions: . sodium chloride    . sodium chloride    . sodium chloride 75 mL/hr at 08/25/16 7096   Marzetta Board, MD, PhD [facility] Pager [phone]  If 7PM-7AM, please contact night-coverage [email or web address] Password [facility] 08/25/2016, 12:33 PM

## 2016-08-25 NOTE — Progress Notes (Signed)
PT Cancellation Note  Patient Details Name: Jaime [name] MRN: [id number] DOB: 1965-04-22   Cancelled Treatment:    Reason Eval/Treat Not Completed: Pt resting comfortably. Spoke with visitor who reported pt was having headaches today. Will check back another day.    Weston Anna, MPT Pager: [phone]

## 2016-08-26 ENCOUNTER — Other Ambulatory Visit: Payer: Self-pay | Admitting: Oncology

## 2016-08-26 DIAGNOSIS — R6510 Systemic inflammatory response syndrome (SIRS) of non-infectious origin without acute organ dysfunction: Secondary | ICD-10-CM

## 2016-08-26 DIAGNOSIS — R7989 Other specified abnormal findings of blood chemistry: Secondary | ICD-10-CM

## 2016-08-26 DIAGNOSIS — C712 Malignant neoplasm of temporal lobe: Secondary | ICD-10-CM

## 2016-08-26 LAB — CBC
HEMATOCRIT: 27.2 % — AB (ref 36.0–46.0)
Hemoglobin: 9.1 g/dL — ABNORMAL LOW (ref 12.0–15.0)
MCH: 29.7 pg (ref 26.0–34.0)
MCHC: 33.5 g/dL (ref 30.0–36.0)
MCV: 88.9 fL (ref 78.0–100.0)
Platelets: 104 10*3/uL — ABNORMAL LOW (ref 150–400)
RBC: 3.06 MIL/uL — ABNORMAL LOW (ref 3.87–5.11)
RDW: 17.2 % — AB (ref 11.5–15.5)
WBC: 3.1 10*3/uL — ABNORMAL LOW (ref 4.0–10.5)

## 2016-08-26 LAB — BASIC METABOLIC PANEL
ANION GAP: 8 (ref 5–15)
BUN: 8 mg/dL (ref 6–20)
CO2: 26 mmol/L (ref 22–32)
Calcium: 8.6 mg/dL — ABNORMAL LOW (ref 8.9–10.3)
Chloride: 106 mmol/L (ref 101–111)
Creatinine, Ser: 0.33 mg/dL — ABNORMAL LOW (ref 0.44–1.00)
GFR calc Af Amer: >60 mL/min (ref >60)
GLUCOSE: 169 mg/dL — AB (ref 65–99)
Potassium: 3.7 mmol/L (ref 3.5–5.1)
Sodium: 140 mmol/L (ref 135–145)

## 2016-08-26 LAB — GLUCOSE, CAPILLARY
Glucose-Capillary: 181 mg/dL — ABNORMAL HIGH (ref 65–99)
Glucose-Capillary: 193 mg/dL — ABNORMAL HIGH (ref 65–99)

## 2016-08-26 NOTE — Care Management Note (Signed)
Case Management Note  Patient Details  Name: Jaime [name] MRN: [id number] Date of Birth: 09-09-1965  Subjective/Objective:   Symptomatic anemia, DM                 Action/Plan: Discharge Planning: please see previous NCM notes.  Chart reviewed. [place] arranged with [facility]. Message sent to rep to make aware of dc home today. Pt has RW with seat in room.   PCP Katherine Roan MD  Expected Discharge Date:  08/26/16               Expected Discharge Plan:  [place]  In-House Referral:  Clinical Social Work  Discharge planning Services  CM Consult  Post Acute Care Choice:  Home Health Choice offered to:  Patient  DME Arranged:  Walker rolling with seat DME Agency:  [facility] Arranged:  RN, Nurse's Aide [place] Agency:  [facility]  Status of Service:  Completed, signed off  If discussed at [place] of Stay Meetings, dates discussed:    Additional Comments:  Erenest Rasher, RN 08/26/2016, 9:49 AM

## 2016-08-26 NOTE — Discharge Instructions (Signed)
Follow with Katherine Roan, MD in 2-4 weeks  Please get a complete blood count and chemistry panel checked by your Primary MD at your next visit, and again as instructed by your Primary MD. Please get your medications reviewed and adjusted by your Primary MD.  Please request your Primary MD to go over all Hospital Tests and Procedure/Radiological results at the follow up, please get all Hospital records sent to your Prim MD by signing hospital release before you go home.  If you had Pneumonia of Lung problems at the Hospital: Please get a 2 view Chest X ray done in 6-8 weeks after hospital discharge or sooner if instructed by your Primary MD.  If you have Congestive Heart Failure: Please call your Cardiologist or Primary MD anytime you have any of the following symptoms:  1) 3 pound weight gain in 24 hours or 5 pounds in 1 week  2) shortness of breath, with or without a dry hacking cough  3) swelling in the hands, feet or stomach  4) if you have to sleep on extra pillows at night in order to breathe  Follow cardiac low salt diet and 1.5 lit/day fluid restriction.  If you have diabetes Accuchecks 4 times/day, Once in AM empty stomach and then before each meal. Log in all results and show them to your primary doctor at your next visit. If any glucose reading is under 80 or above 300 call your primary MD immediately.  If you have Seizure/Convulsions/Epilepsy: Please do not drive, operate heavy machinery, participate in activities at heights or participate in high speed sports until you have seen by Primary MD or a Neurologist and advised to do so again.  If you had Gastrointestinal Bleeding: Please ask your Primary MD to check a complete blood count within one week of discharge or at your next visit. Your endoscopic/colonoscopic biopsies that are pending at the time of discharge, will also need to followed by your Primary MD.  Get Medicines reviewed and adjusted. Please take all your  medications with you for your next visit with your Primary MD  Please request your Primary MD to go over all hospital tests and procedure/radiological results at the follow up, please ask your Primary MD to get all Hospital records sent to his/her office.  If you experience worsening of your admission symptoms, develop shortness of breath, life threatening emergency, suicidal or homicidal thoughts you must seek medical attention immediately by calling 911 or calling your MD immediately  if symptoms less severe.  You must read complete instructions/literature along with all the possible adverse reactions/side effects for all the Medicines you take and that have been prescribed to you. Take any new Medicines after you have completely understood and accpet all the possible adverse reactions/side effects.   Do not drive or operate heavy machinery when taking Pain medications.   Do not take more than prescribed Pain, Sleep and Anxiety Medications  Special Instructions: If you have smoked or chewed Tobacco  in the last 2 yrs please stop smoking, stop any regular Alcohol  and or any Recreational drug use.  Wear Seat belts while driving.  Please note You were cared for by a hospitalist during your hospital stay. If you have any questions about your discharge medications or the care you received while you were in the hospital after you are discharged, you can call the unit and asked to speak with the hospitalist on call if the hospitalist that took care of you is not available.  Once you are discharged, your primary care physician will handle any further medical issues. Please note that NO REFILLS for any discharge medications will be authorized once you are discharged, as it is imperative that you return to your primary care physician (or establish a relationship with a primary care physician if you do not have one) for your aftercare needs so that they can reassess your need for medications and monitor your  lab values.  You can reach the hospitalist office at phone [phone] or fax [phone]   If you do not have a primary care physician, you can call [phone] for a physician referral.  Activity: As tolerated with Full fall precautions use walker/cane & assistance as needed  Diet: regular  Disposition Home

## 2016-08-26 NOTE — Discharge Summary (Signed)
Physician Discharge Summary  Jaime [name] WGN:[id number] DOB: Mar 12, 1965 DOA: 08/20/2016  PCP: Katherine Roan, MD  Admit date: 08/20/2016 Discharge date: 08/26/2016  Admitted From: home Disposition:  Home with [facility]  Recommendations for Outpatient Follow-up:  1. Follow up in cancer center as scheduled in 3 days   Home Health: PT Equipment/Devices: none   Discharge Condition: stable CODE STATUS: DNR Diet recommendation: regular  HPI: Jaime [name] is a 51 y.o. 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 No history of cough or sputum production. No history of chest pain. She had been so weak that requires assistance with ADLs at home. Lastly she has been lethargic with decreased responsiveness, and has had poor oral intake. No history of fever or chills. ED Course: At the ED patient was hypotensive with systolic blood pressures in the 70s, tachycardic, with elevated lactic acid level. Hemoglobin was 4.9. She was given IV fluid boluses and 2 units packed red blood cell transfusion initiated thereafter  Hospital Course: Discharge Diagnoses:  Principal Problem:   Symptomatic anemia Active Problems:   Diabetes mellitus type 2 with retinopathy (HCC)   Glioblastoma multiforme of temporal lobe (HCC)   Dehydration   Acute prerenal azotemia   SIRS (systemic inflammatory response syndrome) (HCC)   Protein-calorie malnutrition, severe  Symptomatic anemia, most likely due to hemolysis -Patient was admitted to the hospital with weakness in the setting of symptomatic anemia. She underwent transfusion of total of 4 units packed red blood cells, most recent on 7/24. Her hemoglobin has remained stable since without significant decrease. Oncology was consulted and followed patient while hospitalized.  She had no clear evidence of bleeding, and a fecal occult was negative. Oncology recommended a bone marrow biopsy, which was on 7/25, and that showed normal cellular bone marrow for age with trilineage hematopoiesis. She also had mild leukopenia as well as cytopenia, which were also stable, and started to recover on the date of discharge. Her blood counts remained stable, and she was discharged home with close outpatient follow-up with oncology. SIRS secondary to acute illness pancytopenia, acute blood loss anemia, unlikely an infectious etiology -unclear etiology, cultures remain negative, she was initially on broad-spectrum antibiotics with vancomycin and Zosyn for about 5 days, and eventually antibiotics were discontinued on 7/26. She remained afebrile. Diabetes mellitus type 2 with retinopathy ([place]) -continue home regimen Glioblastoma multiforme of temporal lobe ([place]) -mental status clear this AM, outpatient follow-up with oncology.   Discharge Instructions   Allergies as of 08/26/2016   No Known Allergies     Medication List    STOP taking these medications   lisinopril 40 MG tablet Commonly known as:  PRINIVIL,ZESTRIL     TAKE these medications   ACCU-CHEK AVIVA PLUS test strip Generic drug:  glucose blood USE TO TEST BLOOD SUGAR FOUR TIMES DAILY   ACCU-CHEK SOFTCLIX LANCETS lancets USE TO CHECK BLOOD SUGAR FOUR TIMES DAILY   acetaminophen 500 MG tablet Commonly known as:  TYLENOL Take 500 mg by mouth every 6 (six) hours as needed for moderate pain.   aspirin EC 81 MG tablet Take 81 mg by mouth daily.   blood glucose meter kit and supplies Dispense based on patient and insurance preference. Use up to four times daily as directed. (FOR ICD-9 250.00, 250.01).   dexamethasone 4 MG tablet Commonly known as:  DECADRON Take 1 tablet (4 mg total) by mouth 2 (two) times daily with a meal. What changed:  how much to take  when to take this   emollient cream Commonly known  as:  BIAFINE Apply 1 application topically daily.   famotidine 20 MG tablet Commonly known as:  PEPCID Take 1 tablet (20 mg total) by mouth 2 (two) times daily.   feeding supplement (BOOST BREEZE) Liqd Take 1 Bottle by mouth 3 (three) times daily.   fluticasone 50 MCG/ACT nasal spray Commonly known as:  FLONASE Place 2 sprays into both nostrils daily.   insulin aspart 100 UNIT/ML FlexPen Commonly known as:  NOVOLOG FLEXPEN Inject 5 Units into the skin 3 (three) times daily with meals. What changed:  how much to take   insulin glargine 100 unit/mL Sopn Commonly known as:  LANTUS Inject 0.17 mLs (17 Units total) into the skin at bedtime.   Insulin Pen Needle 31G X 5 MM Misc Use for injections under the skin as directed.   levETIRAcetam 500 MG tablet Commonly known as:  KEPPRA TAKE 1 TABLET BY MOUTH TWICE DAILY   loratadine 10 MG tablet Commonly known as:  CLARITIN Take 1 tablet (10 mg total) by mouth daily.   LORazepam 0.5 MG tablet Commonly known as:  ATIVAN 1 tablet po 30 minutes prior to radiation or MRI   metFORMIN 1000 MG tablet Commonly known as:  GLUCOPHAGE Take 1 tablet (1,000 mg total) by mouth 2 (two) times daily with a meal.   montelukast 10 MG tablet Commonly known as:  SINGULAIR Take 1 tablet (10 mg total) by mouth at bedtime.   ondansetron 4 MG disintegrating tablet Commonly known as:  ZOFRAN ODT Take 1 tablet (4 mg total) by mouth every 8 (eight) hours as needed for nausea or vomiting.   temozolomide 140 MG capsule Commonly known as:  TEMODAR Take 1 capsule (140 mg total) by mouth daily. May take on an empty stomach or at bedtime to decrease nausea & vomiting.   valACYclovir 1000 MG tablet Commonly known as:  VALTREX Take 1,000 mg by mouth daily.            Durable Medical Equipment        Start     Ordered   08/24/16 1109  For home use only DME 4 wheeled rolling walker with seat  Once    Question:  Patient needs a walker to treat with  the following condition  Answer:  Balance disorder   08/24/16 1109     Follow-up Information    Ladell Pier, MD. Schedule an appointment as soon as possible for a visit in 2 week(s).   Specialty:  Oncology Contact information: [place] [address] [phone]        [facility] Follow up.   Why:  Home Health RN and aide Contact information: [phone]         No Known Allergies  Consultations:  Oncology  Interventional radiology  Procedures/Studies:  Bone marrow biopsy on 7/25  Mr Brain W Wo Contrast  Result Date: 08/11/2016 CLINICAL DATA:  51 y/o F; temporal lobe glioblastoma. Radiation and temozolomide treatment completed 07/24/2016. Currently receiving Decadron. EXAM: MRI HEAD WITHOUT AND WITH CONTRAST TECHNIQUE: Multiplanar, multiecho pulse sequences of the brain and surrounding structures were obtained without and with intravenous contrast. CONTRAST:  3m MULTIHANCE GADOBENATE DIMEGLUMINE 529 MG/ML IV SOLN COMPARISON:  06/18/2016 MRI of the head. FINDINGS: Brain: Necrotic mass centered within the  left medial temporal lobe and hippocampus extending as far posteriorly as the left forceps of splenium of corpus callosum. The enhancing component measures up to 63 x 51 x 33 mm (AP x ML x CC series 13, image 23 and series 14, image 32). On the prior study measured in a similar fashion the mass measured 60 x 48 x 29 mm. T2 FLAIR signal abnormality extends throughout white matter in the left temporal lobe, left occipital lobe, left splenium of corpus callosum, left posterior limb of internal capsule, and left subinsular white matter. In comparison with the prior study the distribution T2 FLAIR signal abnormality is slightly decreased in its superior extension into the parietal lobe and anterior extension into the insula. Mass effect effaces the temporal horn of left lateral ventricle, abuts the left cerebral peduncle with rightward  displacement, and 6 mm of left-to-right midline shift. No focus of reduced diffusion elsewhere in the brain to suggest acute or early subacute infarct. No evidence for acute intracranial hemorrhage. No additional focus of enhancement is identified to suggest multicentric disease. Vascular: Normal flow voids. Skull and upper cervical spine: Normal marrow signal. Sinuses/Orbits: Negative. Other: None. IMPRESSION: 1. Necrotic enhancing mass within the left medial temporal lobe. The enhancing portion of the mass has slightly increased in size in comparison with the prior MRI of the brain. 2. Surround T2 FLAIR hyperintense signal abnormality may represent edema and/or nonenhancing tumor. Signal abnormality is decreased in anterior extension into insula and superior extension into parietal lobe. 3. Decreased mass effect with 6 mm left-to-right midline shift, previously 11 mm. 4. Overall mixed imaging response with increasing enhancing tumor and decreased edema / mass effect. These results were called by telephone at the time of interpretation on 08/11/2016 at 6:08 pm to Dr. Burr Medico, who verbally acknowledged these results. Electronically Signed   By: Kristine Garbe M.D.   On: 08/11/2016 18:11   Ct Biopsy  Result Date: 08/23/2016 CLINICAL DATA:  Severe anemia and thrombocytopenia. EXAM: CT GUIDED BONE MARROW ASPIRATION AND BIOPSY ANESTHESIA/SEDATION: Versed 1.5 mg IV, Fentanyl 75 mcg IV Total Moderate Sedation Time:  16 minutes. The patient's level of consciousness and physiologic status were continuously monitored during the procedure by Radiology nursing. PROCEDURE: The procedure risks, benefits, and alternatives were explained to the patient. Questions regarding the procedure were encouraged and answered. The patient understands and consents to the procedure. A time out was performed prior to initiating the procedure. The right gluteal region was prepped with chlorhexidine. Sterile gown and sterile gloves were  used for the procedure. Local anesthesia was provided with 1% Lidocaine. Under CT guidance, an 11 gauge On Control bone cutting needle was advanced from a posterior approach into the right iliac bone. Needle positioning was confirmed with CT. Initial non heparinized and heparinized aspirate samples were obtained of bone marrow. Core biopsy was performed via the On Control drill needle. COMPLICATIONS: None FINDINGS: Inspection of initial aspirate did reveal visible particles. Intact core biopsy sample was obtained. IMPRESSION: CT guided bone marrow biopsy of right posterior iliac bone with both aspirate and core samples obtained. Electronically Signed   By: Aletta Edouard M.D.   On: 08/23/2016 12:18   Dg Chest Port 1 View  Result Date: 08/20/2016 CLINICAL DATA:  Generalized weakness beginning yesterday which shortness-of-breath. EXAM: PORTABLE CHEST 1 VIEW COMPARISON:  Chest CT 06/20/2016 FINDINGS: Lungs are hypoinflated with minimal linear density over the lateral left base likely atelectasis. No evidence of effusion or pneumothorax. Cardiomediastinal silhouette is within normal. Mild  degenerate change of the spine. IMPRESSION: Minimal linear atelectasis left base. Electronically Signed   By: Marin Olp M.D.   On: 08/20/2016 08:54   Ct Bone Marrow Biopsy & Aspiration  Result Date: 08/23/2016 CLINICAL DATA:  Severe anemia and thrombocytopenia. EXAM: CT GUIDED BONE MARROW ASPIRATION AND BIOPSY ANESTHESIA/SEDATION: Versed 1.5 mg IV, Fentanyl 75 mcg IV Total Moderate Sedation Time:  16 minutes. The patient's level of consciousness and physiologic status were continuously monitored during the procedure by Radiology nursing. PROCEDURE: The procedure risks, benefits, and alternatives were explained to the patient. Questions regarding the procedure were encouraged and answered. The patient understands and consents to the procedure. A time out was performed prior to initiating the procedure. The right gluteal  region was prepped with chlorhexidine. Sterile gown and sterile gloves were used for the procedure. Local anesthesia was provided with 1% Lidocaine. Under CT guidance, an 11 gauge On Control bone cutting needle was advanced from a posterior approach into the right iliac bone. Needle positioning was confirmed with CT. Initial non heparinized and heparinized aspirate samples were obtained of bone marrow. Core biopsy was performed via the On Control drill needle. COMPLICATIONS: None FINDINGS: Inspection of initial aspirate did reveal visible particles. Intact core biopsy sample was obtained. IMPRESSION: CT guided bone marrow biopsy of right posterior iliac bone with both aspirate and core samples obtained. Electronically Signed   By: Aletta Edouard M.D.   On: 08/23/2016 12:18     Subjective: - no chest pain, shortness of breath, no abdominal pain, nausea or vomiting.   Discharge Exam: Vitals:   08/25/16 2106 08/26/16 0448  BP: 104/62 119/78  Pulse: (!) 119 (!) 103  Resp: 18 18  Temp: 99.2 F (37.3 C) 98.6 F (37 C)   Vitals:   08/25/16 0904 08/25/16 1426 08/25/16 2106 08/26/16 0448  BP: 102/64 105/72 104/62 119/78  Pulse:  (!) 119 (!) 119 (!) 103  Resp:  18 18 18   Temp:  98.7 F (37.1 C) 99.2 F (37.3 C) 98.6 F (37 C)  TempSrc:  Oral Oral Oral  SpO2:  99% 96% 96%  Weight:      Height:        General: Pt is alert, awake, not in acute distress Cardiovascular: RRR, S1/S2 +, no rubs, no gallops Respiratory: CTA bilaterally, no wheezing, no rhonchi Abdominal: Soft, NT, ND, bowel sounds + Extremities: no edema, no cyanosis    The results of significant diagnostics from this hospitalization (including imaging, microbiology, ancillary and laboratory) are listed below for reference.     Microbiology: Recent Results (from the past 240 hour(s))  Blood Culture (routine x 2)     Status: None   Collection Time: 08/20/16  8:25 AM  Result Value Ref Range Status   Specimen Description  BLOOD RIGHT WRIST  Final   Special Requests   Final    BOTTLES DRAWN AEROBIC AND ANAEROBIC Blood Culture adequate volume   Culture   Final    NO GROWTH 5 DAYS Performed at [facility], [address], [place], [place] [address]    Report Status 08/25/2016 FINAL  Final  Blood Culture (routine x 2)     Status: None   Collection Time: 08/20/16  8:27 AM  Result Value Ref Range Status   Specimen Description BLOOD LEFT ANTECUBITAL  Final   Special Requests   Final    BOTTLES DRAWN AEROBIC AND ANAEROBIC Blood Culture adequate volume   Culture   Final  NO GROWTH 5 DAYS Performed at [facility], [place] [address], [place], [place] [address]    Report Status 08/25/2016 FINAL  Final  Urine culture     Status: Abnormal   Collection Time: 08/20/16  8:27 AM  Result Value Ref Range Status   Specimen Description URINE, RANDOM  Final   Special Requests NONE  Final   Culture MULTIPLE SPECIES PRESENT, SUGGEST RECOLLECTION (A)  Final   Report Status 08/21/2016 FINAL  Final  MRSA PCR Screening     Status: Abnormal   Collection Time: 08/21/16  6:05 PM  Result Value Ref Range Status   MRSA by PCR POSITIVE (A) NEGATIVE Final    Comment:        The GeneXpert MRSA Assay (FDA approved for NASAL specimens only), is one component of a comprehensive MRSA colonization surveillance program. It is not intended to diagnose MRSA infection nor to guide or monitor treatment for MRSA infections. RESULT CALLED TO, READ BACK BY AND VERIFIED WITH: SCOTT,B RN 7.23.18 @2106  ZANDO,C      Labs: BNP (last 3 results)  Recent Labs  08/20/16 1545  BNP 46.2   Basic Metabolic Panel:  Recent Labs Lab 08/22/16 0319 08/23/16 0357 08/24/16 0426 08/25/16 0929 08/26/16 0426  NA 139 139 138 139 140  K 4.1 3.8 3.0* 3.1* 3.7  CL 106 104 103 105 106  CO2 25 27 27 25 26   GLUCOSE 269* 250* 258* 251* 169*  BUN 15 22* 14 12 8   CREATININE 0.42* 0.51 0.41* 0.38* 0.33*  CALCIUM 8.6* 9.1 8.7* 8.8* 8.6*     Liver Function Tests:  Recent Labs Lab 08/20/16 0828 08/21/16 2035 08/23/16 0357 08/24/16 1455  AST 31  --  28 83*  ALT 23  --  31 64*  ALKPHOS 53  --  61 76  BILITOT 2.8* 1.7* 1.3* 1.0  PROT 5.9*  --  5.9* 6.3*  ALBUMIN 2.7*  --  2.7* 3.0*   No results for input(s): LIPASE, AMYLASE in the last 168 hours. No results for input(s): AMMONIA in the last 168 hours. CBC:  Recent Labs Lab 08/20/16 0828  08/22/16 0319 08/22/16 1928 08/23/16 1119 08/24/16 0426 08/25/16 0929 08/26/16 0426  WBC 3.0*  < > 3.3*  --  3.9* 2.9* 2.5* 3.1*  NEUTROABS 1.8  --   --   --  2.6  --   --   --   HGB 4.9*  < > 6.6* 10.4* 9.7* 9.3* 9.4* 9.1*  HCT 14.5*  < > 18.7* 29.7* 28.0* 27.7* 28.0* 27.2*  MCV 86.3  < > 82.4  --  85.1 85.0 88.9 88.9  PLT 106*  < > 90*  91*  --  89* 90* 95* 104*  < > = values in this interval not displayed. Cardiac Enzymes:  Recent Labs Lab 08/20/16 1545 08/21/16 0033 08/21/16 0535  TROPONINI <0.03 <0.03 <0.03   BNP: Invalid input(s): POCBNP CBG:  Recent Labs Lab 08/25/16 1130 08/25/16 1643 08/25/16 2104 08/26/16 0730 08/26/16 1157  GLUCAP 252* 326* 321* 181* 193*   D-Dimer No results for input(s): DDIMER in the last 72 hours. Hgb A1c No results for input(s): HGBA1C in the last 72 hours. Lipid Profile No results for input(s): CHOL, HDL, LDLCALC, TRIG, CHOLHDL, LDLDIRECT in the last 72 hours. Thyroid function studies No results for input(s): TSH, T4TOTAL, T3FREE, THYROIDAB in the last 72 hours.  Invalid input(s): FREET3 Anemia work up No results for input(s): VITAMINB12, FOLATE, FERRITIN, TIBC, IRON, RETICCTPCT  in the last 72 hours. Urinalysis    Component Value Date/Time   COLORURINE YELLOW 08/20/2016 0827   APPEARANCEUR HAZY (A) 08/20/2016 0827   LABSPEC 1.010 08/20/2016 0827   LABSPEC 1.010 08/09/2016 1502   PHURINE 5.0 08/20/2016 0827   GLUCOSEU 50 (A) 08/20/2016 0827   GLUCOSEU Negative 08/09/2016 1502   HGBUR NEGATIVE 08/20/2016 0827    BILIRUBINUR NEGATIVE 08/20/2016 0827   BILIRUBINUR Negative 08/09/2016 1502   KETONESUR NEGATIVE 08/20/2016 0827   PROTEINUR NEGATIVE 08/20/2016 0827   UROBILINOGEN 0.2 08/09/2016 1502   NITRITE NEGATIVE 08/20/2016 0827   LEUKOCYTESUR MODERATE (A) 08/20/2016 0827   LEUKOCYTESUR Negative 08/09/2016 1502   Sepsis Labs Invalid input(s): PROCALCITONIN,  WBC,  LACTICIDVEN Microbiology Recent Results (from the past 240 hour(s))  Blood Culture (routine x 2)     Status: None   Collection Time: 08/20/16  8:25 AM  Result Value Ref Range Status   Specimen Description BLOOD RIGHT WRIST  Final   Special Requests   Final    BOTTLES DRAWN AEROBIC AND ANAEROBIC Blood Culture adequate volume   Culture   Final    NO GROWTH 5 DAYS Performed at [facility], [address] [place], [place] [address]    Report Status 08/25/2016 FINAL  Final  Blood Culture (routine x 2)     Status: None   Collection Time: 08/20/16  8:27 AM  Result Value Ref Range Status   Specimen Description BLOOD LEFT ANTECUBITAL  Final   Special Requests   Final    BOTTLES DRAWN AEROBIC AND ANAEROBIC Blood Culture adequate volume   Culture   Final    NO GROWTH 5 DAYS Performed at [facility], [place] [address], [place], [place] [address]    Report Status 08/25/2016 FINAL  Final  Urine culture     Status: Abnormal   Collection Time: 08/20/16  8:27 AM  Result Value Ref Range Status   Specimen Description URINE, RANDOM  Final   Special Requests NONE  Final   Culture MULTIPLE SPECIES PRESENT, SUGGEST RECOLLECTION (A)  Final   Report Status 08/21/2016 FINAL  Final  MRSA PCR Screening     Status: Abnormal   Collection Time: 08/21/16  6:05 PM  Result Value Ref Range Status   MRSA by PCR POSITIVE (A) NEGATIVE Final    Comment:        The GeneXpert MRSA Assay (FDA approved for NASAL specimens only), is one component of a comprehensive MRSA colonization surveillance program. It is not intended to diagnose  MRSA infection nor to guide or monitor treatment for MRSA infections. RESULT CALLED TO, READ BACK BY AND VERIFIED WITH: SCOTT,B RN 7.23.18 @2106  ZANDO,C      Time coordinating discharge: 45 minutes  SIGNED:  Marzetta Board, MD  [facility] 08/26/2016, 2:09 PM Pager [phone]  If 7PM-7AM, please contact night-coverage [email or web address] Password [email or web address]

## 2016-08-26 NOTE — Progress Notes (Signed)
Reviewed discharge information with patient and caregiver. Answered all questions. Patient/caregiver able to teach back medications and reasons to contact MD/911. Patient verbalizes importance of PCP follow up appointment.  Cadee Agro M. Marquese Burkland, RN  

## 2016-08-29 ENCOUNTER — Ambulatory Visit (HOSPITAL_BASED_OUTPATIENT_CLINIC_OR_DEPARTMENT_OTHER): Payer: Medicaid Other | Admitting: Nurse Practitioner

## 2016-08-29 ENCOUNTER — Telehealth: Payer: Self-pay

## 2016-08-29 ENCOUNTER — Other Ambulatory Visit (HOSPITAL_BASED_OUTPATIENT_CLINIC_OR_DEPARTMENT_OTHER): Payer: Medicaid Other

## 2016-08-29 VITALS — BP 84/58 | HR 123 | Temp 98.8°F | Resp 16 | Ht 62.5 in | Wt 147.9 lb

## 2016-08-29 DIAGNOSIS — D649 Anemia, unspecified: Secondary | ICD-10-CM | POA: Diagnosis not present

## 2016-08-29 DIAGNOSIS — C712 Malignant neoplasm of temporal lobe: Secondary | ICD-10-CM

## 2016-08-29 DIAGNOSIS — R51 Headache: Secondary | ICD-10-CM

## 2016-08-29 DIAGNOSIS — C7931 Secondary malignant neoplasm of brain: Secondary | ICD-10-CM | POA: Diagnosis not present

## 2016-08-29 DIAGNOSIS — D696 Thrombocytopenia, unspecified: Secondary | ICD-10-CM | POA: Diagnosis not present

## 2016-08-29 LAB — CBC WITH DIFFERENTIAL/PLATELET
BASO%: 0.3 % (ref 0.0–2.0)
Basophils Absolute: 0 10*3/uL (ref 0.0–0.1)
EOS%: 0 % (ref 0.0–7.0)
Eosinophils Absolute: 0 10*3/uL (ref 0.0–0.5)
HCT: 32.3 % — ABNORMAL LOW (ref 34.8–46.6)
HEMOGLOBIN: 10.5 g/dL — AB (ref 11.6–15.9)
LYMPH%: 31.7 % (ref 14.0–49.7)
MCH: 30.3 pg (ref 25.1–34.0)
MCHC: 32.5 g/dL (ref 31.5–36.0)
MCV: 93.4 fL (ref 79.5–101.0)
MONO#: 0.2 10*3/uL (ref 0.1–0.9)
MONO%: 6.3 % (ref 0.0–14.0)
NEUT%: 61.7 % (ref 38.4–76.8)
NEUTROS ABS: 2.4 10*3/uL (ref 1.5–6.5)
Platelets: 207 10*3/uL (ref 145–400)
RBC: 3.46 10*6/uL — ABNORMAL LOW (ref 3.70–5.45)
RDW: 19.4 % — AB (ref 11.2–14.5)
WBC: 3.8 10*3/uL — AB (ref 3.9–10.3)
lymph#: 1.2 10*3/uL (ref 0.9–3.3)

## 2016-08-29 LAB — COMPREHENSIVE METABOLIC PANEL
ALBUMIN: 3.3 g/dL — AB (ref 3.5–5.0)
ALT: 54 U/L (ref 0–55)
ANION GAP: 11 meq/L (ref 3–11)
AST: 23 U/L (ref 5–34)
Alkaline Phosphatase: 87 U/L (ref 40–150)
BILIRUBIN TOTAL: 0.71 mg/dL (ref 0.20–1.20)
BUN: 15.5 mg/dL (ref 7.0–26.0)
CO2: 26 meq/L (ref 22–29)
CREATININE: 0.7 mg/dL (ref 0.6–1.1)
Calcium: 9.7 mg/dL (ref 8.4–10.4)
Chloride: 102 mEq/L (ref 98–109)
EGFR: >90 mL/min/{1.73_m2} (ref >90)
Glucose: 186 mg/dl — ABNORMAL HIGH (ref 70–140)
Potassium: 3.2 mEq/L — ABNORMAL LOW (ref 3.5–5.1)
SODIUM: 139 meq/L (ref 136–145)
TOTAL PROTEIN: 6.6 g/dL (ref 6.4–8.3)

## 2016-08-29 LAB — LACTATE DEHYDROGENASE: LDH: 390 U/L — ABNORMAL HIGH (ref 125–245)

## 2016-08-29 NOTE — Progress Notes (Addendum)
[place] OFFICE PROGRESS NOTE   Diagnosis:  Brain tumor  INTERVAL HISTORY:   Jaime [name] returns as scheduled. She was hospitalized 08/20/2016 through 08/26/2016 with severe anemia. She received red cell transfusion support. Bone marrow biopsy 08/23/2016 showed normocellular bone marrow for age with trilineage hematopoiesis.  She reports feeling stronger. No nausea or vomiting. She continues to have balance problems. No falls. She reports a "constant headache". She is currently on dexamethasone 2 mg twice daily. She has had intermittent pain at the left upper leg since hospital discharge. She reports adequate fluid intake.  Objective:  Vital signs in last 24 hours:  Blood pressure (!) 84/58, pulse (!) 123, temperature 98.8 F (37.1 C), temperature source Oral, resp. rate 16, height 5' 2.5" (1.588 m), weight 147 lb 14.4 oz (67.1 kg), last menstrual period 10/31/2005, SpO2 98 %.    HEENT: White coating over tongue. No buccal thrush. Resp: Lungs clear bilaterally. Cardio: Regular rate and rhythm. GI: Abdomen soft and nontender. No hepatomegaly. Vascular: No leg edema. Neuro: Alert and oriented. Follows commands.  Skin: No rash. Musculoskeletal: Tender left upper lateral leg. No erythema. No rash.    Lab Results:  Lab Results  Component Value Date   WBC 3.8 (L) 08/29/2016   HGB 10.5 (L) 08/29/2016   HCT 32.3 (L) 08/29/2016   MCV 93.4 08/29/2016   PLT 207 08/29/2016   NEUTROABS 2.4 08/29/2016    Imaging:  No results found.  Medications: I have reviewed the patient's current medications.  Assessment/Plan: 1. History of iron deficiency anemia. Mild normocytic anemia during hospital admission May 2018 2. Sickle cell trait. 3. Status post hysterectomy. 4. History of dysphagia. She underwent an endoscopy by Dr. Collene Mares.  5. Diabetes 6. 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 History of Nausea/vomiting and headaches secondary to #6 8. Dry skin rash both hands. 9. Viral appearing lesions right medial buttock. Status post 7 day course of Valtrex-healed 10. History of Markedly elevated BUN/creatinine-likely related to dehydration versus GI bleeding 11. Severe anemia, mild thrombocytopenia-potentially related to temozolomide/radiation or primary bone marrow process; Bone marrow biopsy 08/23/2016 with normocellular marrow for age with trilineage hematopoiesis.    Disposition: Jaime [name] appears stable. The hemoglobin is better and the platelet count has normalized. The anemia and thrombocytopenia were most likely related to temozolomide/radiation. We will repeat a CBC when she returns in 2 weeks.  Dr. Benay Spice discussed options with regard to the brain tumor to include a supportive care approach with a hospice referral versus a trial of Avastin. Potential toxicities associated with Avastin were reviewed including hypertension, bleeding, blood clots, proteinuria, delayed wound healing, bowel perforation, reversible posterior leukoencephalopathy syndrome. Jaime [name] and her mother decided on a supportive care approach. We will make a referral to the [facility] program. CODE STATUS/end-of-life issues were discussed. She will be placed on NO CODE BLUE status.  She is having headaches. We will increase the dexamethasone to 4 mg twice daily.  She will return for a follow-up visit in 2 weeks. She and her mother understand to contact the office in the interim with any problems.  Patient seen with Dr. Benay Spice. 25 minutes were spent face-to-face at today's visit  with the  majority of that time involved in counseling/coordination of care.    Ned Card ANP/GNP-BC   08/29/2016  11:04 AM   This was a shared visit with Ned Card. Jaime [name] had a nondiagnostic bone marrow biopsy while in the hospital. The hemoglobin and platelet count have recovered. It appears the anemia/thrombocytopenia were related to toxicity from temozolomide.  We discussed the clinical and MRI evidence of tumor progression with Jaime [name] and her family. We discussed comfort/hospice care versus salvage therapy with Avastin. We reviewed the potential toxicities associated with Avastin. They decided on comfort care. We discussed CPR and ACLS issues. She will be placed on a no CODE BLUE status. We made a referral to the [facility] hospice program.  Julieanne Manson, M.D.

## 2016-08-29 NOTE — Telephone Encounter (Signed)
appts made and avs printed for patient 

## 2016-08-30 ENCOUNTER — Other Ambulatory Visit: Payer: Self-pay | Admitting: Nurse Practitioner

## 2016-08-30 MED ORDER — POTASSIUM CHLORIDE CRYS ER 20 MEQ PO TBCR: 20.0000 meq | EXTENDED_RELEASE_TABLET | Freq: Every day | ORAL | 1 refills | Status: DC

## 2016-08-31 ENCOUNTER — Ambulatory Visit (INDEPENDENT_AMBULATORY_CARE_PROVIDER_SITE_OTHER): Payer: Medicaid Other | Admitting: Podiatry

## 2016-08-31 ENCOUNTER — Telehealth: Payer: Self-pay | Admitting: *Deleted

## 2016-08-31 DIAGNOSIS — M79676 Pain in unspecified toe(s): Secondary | ICD-10-CM

## 2016-08-31 DIAGNOSIS — B351 Tinea unguium: Secondary | ICD-10-CM | POA: Diagnosis not present

## 2016-08-31 DIAGNOSIS — E1140 Type 2 diabetes mellitus with diabetic neuropathy, unspecified: Secondary | ICD-10-CM

## 2016-08-31 NOTE — Telephone Encounter (Signed)
TC made to patient regarding prescription for KCL. No answer.  TC made to pt's mother and spoke with her. Informed her that Jaime [name]'s K+ was a bit low this week and that there is a prescription for KCL there. She is to take 1 tablet daily.  Pt's mother voiced understanding and will pick up prescription.

## 2016-08-31 NOTE — Progress Notes (Signed)
Patient ID: Jaime [name], female   DOB: 05/27/1965, 51 y.o.   MRN: [id number] Complaint:  Visit Type: Patient returns to my office for continued preventative foot care services. Complaint: Patient states" my nails have grown long and thick and become painful to walk and wear shoes" Patient has been diagnosed with DM with no foot complications. The patient presents for preventative foot care services. No changes to ROS  Podiatric Exam: Vascular: dorsalis pedis and posterior tibial pulses are palpable bilateral. Capillary return is immediate. Temperature gradient is WNL. Skin turgor WNL  Sensorium: Normal Semmes Weinstein monofilament test. Normal tactile sensation bilaterally. Nail Exam: Pt has thick disfigured discolored nails with subungual debris noted bilateral entire nail hallux through fifth toenails Ulcer Exam: There is no evidence of ulcer or pre-ulcerative changes or infection. Orthopedic Exam: Muscle tone and strength are WNL. No limitations in general ROM. No crepitus or effusions noted. Foot type and digits show no abnormalities. Bony prominences are unremarkable. Skin: No Porokeratosis. No infection or ulcers  Diagnosis:  Onychomycosis, , Pain in right toe, pain in left toes  Treatment & Plan Procedures and Treatment: Consent by patient was obtained for treatment procedures. The patient understood the discussion of treatment and procedures well. All questions were answered thoroughly reviewed. Debridement of mycotic and hypertrophic toenails, 1 through 5 bilateral and clearing of subungual debris. No ulceration, no infection noted.  Return Visit-Office Procedure: Patient instructed to return to the office for a follow up visit 3 months for continued evaluation and treatment.  Carlito Bogert DPM 

## 2016-09-04 ENCOUNTER — Other Ambulatory Visit: Payer: Self-pay

## 2016-09-04 DIAGNOSIS — C712 Malignant neoplasm of temporal lobe: Secondary | ICD-10-CM

## 2016-09-04 MED ORDER — DEXAMETHASONE 4 MG PO TABS: 4.0000 mg | ORAL_TABLET | Freq: Two times a day (BID) | ORAL | 0 refills | Status: DC

## 2016-09-04 MED FILL — BOOST BREEZE LIQUID: 9 days supply | Qty: 6399 | Fill #1

## 2016-09-06 ENCOUNTER — Encounter: Payer: Self-pay | Admitting: *Deleted

## 2016-09-06 ENCOUNTER — Telehealth: Payer: Self-pay | Admitting: Radiation Oncology

## 2016-09-06 ENCOUNTER — Ambulatory Visit: Admission: RE | Admit: 2016-09-06 | Payer: Medicaid Other | Source: Ambulatory Visit | Admitting: Radiation Oncology

## 2016-09-06 NOTE — Telephone Encounter (Signed)
I spoke with [name] mother and confirmed that Jaime [name] is receiving benefits and services through hospice. At this point she's been feeling pretty well and is not having concerns or symptoms her mother feels need to be addressed by [place]. We will cancel her appointment today and see her as needed moving forward.

## 2016-09-06 NOTE — Progress Notes (Signed)
[place] spoke with mother Annice Jolly about the 10.0 one month follow up appointment for today.  The mother responded saying she would need to get her ready and they could arrive around 11 a.m. For the appointment. Alision Dara Lords, P.A. informed about the situation and she plans to call and speak with Mrs. Durnell by telephone now and we will cancel the one month follow up appointment for today.

## 2016-09-07 ENCOUNTER — Encounter (HOSPITAL_COMMUNITY): Payer: Self-pay

## 2016-09-11 LAB — CHROMOSOME ANALYSIS, BONE MARROW

## 2016-09-13 ENCOUNTER — Telehealth: Payer: Self-pay

## 2016-09-13 ENCOUNTER — Other Ambulatory Visit (HOSPITAL_BASED_OUTPATIENT_CLINIC_OR_DEPARTMENT_OTHER): Payer: Medicaid Other

## 2016-09-13 ENCOUNTER — Ambulatory Visit (HOSPITAL_BASED_OUTPATIENT_CLINIC_OR_DEPARTMENT_OTHER): Payer: Medicaid Other | Admitting: Oncology

## 2016-09-13 VITALS — BP 84/54 | HR 102 | Temp 98.6°F | Resp 20 | Ht 62.5 in | Wt 145.2 lb

## 2016-09-13 DIAGNOSIS — C7931 Secondary malignant neoplasm of brain: Secondary | ICD-10-CM

## 2016-09-13 DIAGNOSIS — C712 Malignant neoplasm of temporal lobe: Secondary | ICD-10-CM

## 2016-09-13 DIAGNOSIS — R270 Ataxia, unspecified: Secondary | ICD-10-CM | POA: Diagnosis not present

## 2016-09-13 DIAGNOSIS — C801 Malignant (primary) neoplasm, unspecified: Secondary | ICD-10-CM

## 2016-09-13 DIAGNOSIS — R1110 Vomiting, unspecified: Secondary | ICD-10-CM | POA: Diagnosis present

## 2016-09-13 LAB — CBC WITH DIFFERENTIAL/PLATELET
BASO%: 0.4 % (ref 0.0–2.0)
BASOS ABS: 0 10*3/uL (ref 0.0–0.1)
EOS ABS: 0 10*3/uL (ref 0.0–0.5)
EOS%: 0.3 % (ref 0.0–7.0)
HCT: 34.1 % — ABNORMAL LOW (ref 34.8–46.6)
HGB: 11.4 g/dL — ABNORMAL LOW (ref 11.6–15.9)
LYMPH%: 22.7 % (ref 14.0–49.7)
MCH: 31.4 pg (ref 25.1–34.0)
MCHC: 33.5 g/dL (ref 31.5–36.0)
MCV: 93.6 fL (ref 79.5–101.0)
MONO#: 0.4 10*3/uL (ref 0.1–0.9)
MONO%: 7.2 % (ref 0.0–14.0)
NEUT#: 4.1 10*3/uL (ref 1.5–6.5)
NEUT%: 69.4 % (ref 38.4–76.8)
Platelets: 262 10*3/uL (ref 145–400)
RBC: 3.64 10*6/uL — AB (ref 3.70–5.45)
RDW: 19.6 % — ABNORMAL HIGH (ref 11.2–14.5)
WBC: 6 10*3/uL (ref 3.9–10.3)
lymph#: 1.4 10*3/uL (ref 0.9–3.3)

## 2016-09-13 NOTE — Telephone Encounter (Signed)
appts made and avs printed °

## 2016-09-13 NOTE — Progress Notes (Signed)
  [place] OFFICE PROGRESS NOTE   Diagnosis: Brain tumor  INTERVAL HISTORY:   Jaime [name] returns as scheduled per she has enrolled in the [facility] program. She continues to have intermittent vomiting. She reports vomiting twice this week. Her aunt reports she has ataxia. Jaime [name] has no specific complaint today. No seizure or other focal neurologic symptom.  Objective:   Vital signs in last 24 hours:  Blood pressure (!) 84/54, pulse (!) 102, temperature 98.6 F (37 C), temperature source Oral, resp. rate 20, height 5' 2.5" (1.588 m), weight 145 lb 3.2 oz (65.9 kg), last menstrual period 10/31/2005, SpO2 98 %.    HEENT: Mild whitecoat over the tongue, no buccal thrush Resp: Lungs clear bilaterally Cardio: Regular rate and rhythm GI: No hepatosplenomegaly, nontender Vascular: No leg edema Neuro: Alert, follows commands, the motor exam appears intact in the upper and lower extremities. She ambulated to the examination table without difficulty.    Lab Results:  Lab Results  Component Value Date   WBC 6.0 09/13/2016   HGB 11.4 (L) 09/13/2016   HCT 34.1 (L) 09/13/2016   MCV 93.6 09/13/2016   PLT 262 09/13/2016   NEUTROABS 4.1 09/13/2016     Medications: I have reviewed the patient's current medications.  Assessment/Plan: 1. History of iron deficiency anemia. Mild normocytic anemia during hospital admission May 2018 2. Sickle cell trait. 3. Status post hysterectomy. 4. History of dysphagia. She underwent an endoscopy by Dr. Collene Mares.  5. Diabetes 6. 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 History of Nausea/vomiting and headaches secondary to #6 8. Dry skin rash both hands. 9. Viral appearing lesions right medial buttock. Status post 7 day course of Valtrex-healed 10. History of Markedly elevated BUN/creatinine-likely related to dehydration versus GI bleeding 11. Severe anemia, mild thrombocytopenia-potentially related to temozolomide/radiation or primary bone marrow process; Bone marrow biopsy 08/23/2016 with normocellular marrow for age with trilineage hematopoiesis. The hemoglobin and platelet count have recovered    Disposition:  She appears unchanged. The plan is to continue comfort care. She has enrolled in the [facility] hospice program. I discussed the current status and plans with her aunt. Jaime [name] will return for an office visit in 3 weeks. We are available to see her in the interim as needed.  I reviewed her case with the neuro oncology service.  15 minutes were spent with the patient today. The majority of the time used for counseling and coordination of care.  Donneta Romberg, MD  09/13/2016  11:30 AM

## 2016-09-21 ENCOUNTER — Other Ambulatory Visit: Payer: Self-pay | Admitting: Oncology

## 2016-09-21 ENCOUNTER — Encounter: Payer: Self-pay | Admitting: *Deleted

## 2016-09-21 ENCOUNTER — Encounter: Payer: Self-pay | Admitting: Oncology

## 2016-09-21 NOTE — Progress Notes (Signed)
[place] Work  Clinical Social Work was referred by Development worker, community for assessment of psychosocial needs due to financial concerns.  Per Stefanie Libel, pt recently had power turned off. Clinical Social Worker reviewed chart and contacted pt's mother at home. Per mother, pt receiving hospice services, but has not met with SW or RN through [facility] to date. CSW discussed possible resources to assist and need to share financial concerns/situation with [facility] team for further assistance. As pt is no longer in active treatment, cancer resource assistance is very limited. CSW available to family as a resource and they do plan to share these needs/concerns with their hospice team today.   Clinical Social Work interventions:  Supportive Psychiatric nurse education and referral  Loren Racer, LCSW, OSW-C Clinical Social Worker [place]  [place] Phone: [phone] Fax: [phone]

## 2016-09-21 NOTE — Progress Notes (Signed)
Patient's mother Emmaline Kluver called on 09/20/16 late afternoon and left voicemail regarding utility interruption. Asked if there were any resources available to her to assist with any of. She states no. We assisted before through the [place] and she has used all of her funds. Advised mother I will reach out to SW and give her a call today.  Called [place](SW) and explained situation. Abby Potash will reach out today to follow up on the number I provided.  Patient has my name and contact number for any additional questions or concerns.

## 2016-09-26 ENCOUNTER — Telehealth: Payer: Self-pay | Admitting: Nurse Practitioner

## 2016-09-26 ENCOUNTER — Other Ambulatory Visit: Payer: Self-pay | Admitting: Emergency Medicine

## 2016-09-26 NOTE — Telephone Encounter (Signed)
Scheduled appt per 7/16 sch message - patient Is aware of appt date and time.

## 2016-09-29 ENCOUNTER — Other Ambulatory Visit: Payer: Self-pay | Admitting: *Deleted

## 2016-09-29 MED ORDER — MONTELUKAST SODIUM 10 MG PO TABS: 10.0000 mg | ORAL_TABLET | Freq: Every day | ORAL | 2 refills | Status: DC

## 2016-10-04 ENCOUNTER — Telehealth: Payer: Self-pay | Admitting: Nurse Practitioner

## 2016-10-04 ENCOUNTER — Ambulatory Visit (HOSPITAL_BASED_OUTPATIENT_CLINIC_OR_DEPARTMENT_OTHER): Payer: Medicaid Other | Admitting: Nurse Practitioner

## 2016-10-04 VITALS — BP 96/61 | HR 77 | Temp 98.8°F | Resp 18 | Ht 62.5 in | Wt 146.5 lb

## 2016-10-04 DIAGNOSIS — C7931 Secondary malignant neoplasm of brain: Secondary | ICD-10-CM | POA: Diagnosis not present

## 2016-10-04 DIAGNOSIS — C712 Malignant neoplasm of temporal lobe: Secondary | ICD-10-CM

## 2016-10-04 NOTE — Telephone Encounter (Signed)
Scheduled appt per 9/5 los - Gave patient AVS and calender per los.  

## 2016-10-04 NOTE — Progress Notes (Addendum)
  [place] OFFICE PROGRESS NOTE   Diagnosis:  Brain tumor  INTERVAL HISTORY:   Jaime [name] returns as scheduled. She is enrolled in the [facility] program. She is currently on dexamethasone 4 mg twice daily. She denies nausea. No significant balance issues. She is walking with a cane. No falls. No headaches. She reports a good appetite. No mouth sores. No leg swelling.  Objective:  Vital signs in last 24 hours:  Blood pressure 96/61, pulse 77, temperature 98.8 F (37.1 C), temperature source Oral, resp. rate 18, height 5' 2.5" (1.588 m), weight 146 lb 8 oz (66.5 kg), last menstrual period 10/31/2005, SpO2 100 %.    HEENT: No thrush or ulcers. Resp: Lungs clear bilaterally. Cardio: Regular rate and rhythm. GI: Abdomen soft and nontender. No hepatomegaly. Vascular: No leg edema. Calves soft and nontender. Neuro: Motor strength 5 over 5.    Lab Results:  Lab Results  Component Value Date   WBC 6.0 09/13/2016   HGB 11.4 (L) 09/13/2016   HCT 34.1 (L) 09/13/2016   MCV 93.6 09/13/2016   PLT 262 09/13/2016   NEUTROABS 4.1 09/13/2016    Imaging:  No results found.  Medications: I have reviewed the patient's current medications.  Assessment/Plan: 1. History of iron deficiency anemia. Mild normocytic anemia during hospital admission May 2018 2. Sickle cell trait. 3. Status post hysterectomy. 4. History of dysphagia. She underwent an endoscopy by Dr. Collene Mares.  5. Diabetes 6. 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 History of Nausea/vomiting and headaches secondary to #6.  Improved. 8. Dry skin rash both hands. 9. Viral appearing lesions right medial buttock. Status post 7 day course of Valtrex-healed 10. History of markedly elevated BUN/creatinine-likely related to dehydration versus GI bleeding 11. Severe anemia, mild thrombocytopenia-potentially related to temozolomide/radiation or primary bone marrow process; Bone marrow biopsy 08/23/2016 with normocellular marrow for age with trilineage hematopoiesis. The hemoglobin and platelet count have recovered   Disposition: Jaime [name] appears stable. She is enrolled in the [facility] program. The plan is to continue supportive/comfort care. She will taper the dexamethasone from 4 mg twice daily to 4 mg every morning and 2 mg every evening. She and her mother understand to contact the office if she develops recurrent headache, nausea/vomiting or other new symptoms. She will return for a follow-up visit in approximately one month.  Patient seen with Dr. Benay Spice.    Ned Card ANP/GNP-BC   10/04/2016  12:43 PM  This was a shared visit with Ned Card. Jaime [name] has an improved performance status. We tapered the Decadron dose. Jaime [name] and her family have decided to cancel the appointment with neuro-oncology.  Julieanne Manson, M.D.

## 2016-10-05 ENCOUNTER — Ambulatory Visit: Payer: Medicaid Other | Admitting: Internal Medicine

## 2016-10-12 ENCOUNTER — Other Ambulatory Visit: Payer: Self-pay | Admitting: *Deleted

## 2016-10-12 MED ORDER — INSULIN GLARGINE 100 UNITS/ML SOLOSTAR PEN: 17.0000 [IU] | PEN_INJECTOR | Freq: Every day | SUBCUTANEOUS | 5 refills | Status: DC

## 2016-10-12 NOTE — Telephone Encounter (Signed)
Pt is comfort care with hospice. I do not see need to sch [facility] appt, esp since would be with new PCP.

## 2016-10-23 ENCOUNTER — Other Ambulatory Visit: Payer: Self-pay | Admitting: *Deleted

## 2016-10-23 MED ORDER — POTASSIUM CHLORIDE CRYS ER 20 MEQ PO TBCR: 20.0000 meq | EXTENDED_RELEASE_TABLET | Freq: Every day | ORAL | 1 refills | Status: DC

## 2016-10-24 ENCOUNTER — Other Ambulatory Visit: Payer: Self-pay | Admitting: *Deleted

## 2016-10-24 DIAGNOSIS — C712 Malignant neoplasm of temporal lobe: Secondary | ICD-10-CM

## 2016-10-24 MED ORDER — DEXAMETHASONE 4 MG PO TABS: ORAL_TABLET | ORAL | 0 refills | Status: DC

## 2016-10-24 NOTE — Telephone Encounter (Signed)
Call from [place], [facility] RN requesting refill of Decadron. Pt continues to take 4mg  QAM and 2mg  QPM. Reviewed with Dr. Benay Spice, order received and e-scribed to pharmacy.

## 2016-10-30 ENCOUNTER — Telehealth: Payer: Self-pay | Admitting: Nurse Practitioner

## 2016-10-30 ENCOUNTER — Ambulatory Visit (HOSPITAL_BASED_OUTPATIENT_CLINIC_OR_DEPARTMENT_OTHER): Payer: Medicaid Other | Admitting: Oncology

## 2016-10-30 VITALS — BP 110/83 | HR 82 | Temp 98.6°F | Resp 16 | Ht 62.5 in | Wt 152.3 lb

## 2016-10-30 DIAGNOSIS — Z23 Encounter for immunization: Secondary | ICD-10-CM

## 2016-10-30 DIAGNOSIS — D649 Anemia, unspecified: Secondary | ICD-10-CM | POA: Diagnosis not present

## 2016-10-30 DIAGNOSIS — R635 Abnormal weight gain: Secondary | ICD-10-CM | POA: Diagnosis not present

## 2016-10-30 DIAGNOSIS — C7931 Secondary malignant neoplasm of brain: Secondary | ICD-10-CM

## 2016-10-30 MED ORDER — INFLUENZA VAC SPLIT QUAD 0.5 ML IM SUSY
0.5000 mL | PREFILLED_SYRINGE | Freq: Once | INTRAMUSCULAR | Status: AC
  Administered 2016-10-30: 0.5 mL via INTRAMUSCULAR
  Filled 2016-10-30: qty 0.5

## 2016-10-30 NOTE — Progress Notes (Signed)
  [place] OFFICE PROGRESS NOTE   Diagnosis: Brain tumor  INTERVAL HISTORY:   Jaime [name] returns as scheduled. She is here today with her mother and aunt. Her appetite has improved. She had an episode of emesis yesterday, but in general is not having nausea or vomiting. Her blood sugar returned in the 120s since morning. She is taking Decadron at a dose of 4 mg in the morning and 2 mg at night.  Objective:  Vital signs in last 24 hours:  Blood pressure 110/83, pulse 82, temperature 98.6 F (37 C), temperature source Oral, resp. rate 16, height 5' 2.5" (1.588 m), weight 152 lb 4.8 oz (69.1 kg), last menstrual period 10/31/2005, SpO2 100 %.    HEENT: Mild whitecoat over the tongue, no buccal thrush Resp: Lungs clear bilaterally Cardio: Regular rate and rhythm GI: No hepatomegaly, nontender Vascular: No leg edema    Medications: I have reviewed the patient's current medications.  Assessment/Plan: 1. History of iron deficiency anemia. Mild normocytic anemia during hospital admission May 2018 2. Sickle cell trait. 3. Status post hysterectomy. 4. History of dysphagia. She underwent an endoscopy by Dr. Collene Mares.  5. Diabetes 6. 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 History of Nausea/vomiting and headaches secondary to #6. Improved. 8. Dry skin rash both hands. 9. Viral appearing lesions right medial buttock. Status post 7 day course of Valtrex-healed 10. History of markedly elevated BUN/creatinine-likely related to dehydration versus GI bleeding 11. Severe anemia, mild  thrombocytopenia-potentially related to temozolomide/radiation or primary bone marrow process; Bone marrow biopsy 08/23/2016 with normocellular marrow for age with trilineage hematopoiesis.The hemoglobin and platelet count recovered     Disposition:  Jaime [name] has an improved performance status. She has gained weight. We tapered the Decadron to 2 mg twice daily. She will return for an office visit in 4 weeks. She received an influenza vaccine today.  Jaime [name] continues follow-up with the [facility] program.  15 minutes were spent with the patient today. The majority of the time was used for counseling and coordination of care.  Jaime Romberg, MD  10/30/2016  11:42 AM

## 2016-10-30 NOTE — Telephone Encounter (Signed)
Gave avs and calendar for October  °

## 2016-11-27 ENCOUNTER — Telehealth: Payer: Self-pay | Admitting: Oncology

## 2016-11-27 ENCOUNTER — Ambulatory Visit (HOSPITAL_BASED_OUTPATIENT_CLINIC_OR_DEPARTMENT_OTHER): Payer: Medicaid Other | Admitting: Nurse Practitioner

## 2016-11-27 VITALS — BP 139/75 | HR 73 | Temp 98.3°F | Resp 18 | Ht 62.5 in | Wt 156.1 lb

## 2016-11-27 DIAGNOSIS — C712 Malignant neoplasm of temporal lobe: Secondary | ICD-10-CM

## 2016-11-27 DIAGNOSIS — C7931 Secondary malignant neoplasm of brain: Secondary | ICD-10-CM

## 2016-11-27 MED ORDER — LEVETIRACETAM 500 MG PO TABS: 500.0000 mg | ORAL_TABLET | Freq: Two times a day (BID) | ORAL | 3 refills | Status: DC

## 2016-11-27 MED ORDER — MONTELUKAST SODIUM 10 MG PO TABS: 10.0000 mg | ORAL_TABLET | Freq: Every day | ORAL | 2 refills | Status: DC

## 2016-11-27 NOTE — Progress Notes (Addendum)
  [place] OFFICE PROGRESS NOTE   Diagnosis:  Brain tumor  INTERVAL HISTORY:   [name] returns as scheduled. She continues dexamethasone 2 mg twice a day. She denies nausea. No headaches. Stable balance issues. No visual disturbance. She reports a good appetite. She is gaining weight.  Objective:  Vital signs in last 24 hours:  Blood pressure 139/75, pulse 73, temperature 98.3 F (36.8 C), temperature source Oral, resp. rate 18, height 5' 2.5" (1.588 m), weight 156 lb 1.6 oz (70.8 kg), last menstrual period 10/31/2005, SpO2 99 %.    HEENT: mild white coating over tongue. No buccal thrush. No ulcers. Resp: lungs clear bilaterally. Cardio: regular rate and rhythm. GI: abdomen soft and nontender. No hepatomegaly. Vascular: no leg edema. Calves soft and nontender.   Lab Results:  Lab Results  Component Value Date   WBC 6.0 09/13/2016   HGB 11.4 (L) 09/13/2016   HCT 34.1 (L) 09/13/2016   MCV 93.6 09/13/2016   PLT 262 09/13/2016   NEUTROABS 4.1 09/13/2016    Imaging:  No results found.  Medications: I have reviewed the patient's current medications.  Assessment/Plan: 1. History of iron deficiency anemia. Mild normocytic anemia during hospital admission May 2018 2. Sickle cell trait. 3. Status post hysterectomy. 4. History of dysphagia. She underwent an endoscopy by Dr. Collene Mares.  5. Diabetes 6. 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 History of Nausea/vomiting and headaches secondary to #6. Improved. 8. Dry skin rash both hands. 9. Viral appearing lesions right medial  buttock. Status post 7 day course of Valtrex-healed 10. History of markedly elevated BUN/creatinine-likely related to dehydration versus GI bleeding 11. Severe anemia, mild thrombocytopenia-potentially related to temozolomide/radiation or primary bone marrow process; Bone marrow biopsy 08/23/2016 with normocellular marrow for age with trilineage hematopoiesis.The hemoglobin and platelet count recovered   Disposition: Jaime [name] continues to have an improved performance status. She is followed by the [facility] program. We tapered the Decadron to 2 mg once a day. She will return for a follow-up visit in 6 weeks. She will contact the office in the interim with any problems.   Patient seen with Dr. Benay Spice.    Ned Card ANP/GNP-BC   11/27/2016  12:15 PM This was a shared visit with Ned Card. Jaime [name] appears well. We tapered the Decadron. She will return for an office visit in 6 weeks. Julieanne Manson, M.D.

## 2016-11-27 NOTE — Patient Instructions (Signed)
Decrease Decadron to 2 mg once a day

## 2016-11-27 NOTE — Telephone Encounter (Signed)
Gave relative avs report and appointments for December

## 2016-11-29 ENCOUNTER — Encounter: Payer: Self-pay | Admitting: Nurse Practitioner

## 2016-11-29 NOTE — Progress Notes (Signed)
Received PA request from [facility] for Montelukast.  Called [employer](Kendall) for Medicaid ID [id number] to intiate auth. She states it is preferred and does not require PA.  Called [facility](Matt) whom states they figured it out. I advised they were running through the incorrect insurance. He states the rejection message was sent before they fixed it. He states everything is fine now.

## 2016-12-06 ENCOUNTER — Ambulatory Visit: Payer: Medicaid Other | Admitting: Podiatry

## 2016-12-06 ENCOUNTER — Encounter: Payer: Self-pay | Admitting: Podiatry

## 2016-12-06 DIAGNOSIS — E1140 Type 2 diabetes mellitus with diabetic neuropathy, unspecified: Secondary | ICD-10-CM

## 2016-12-06 DIAGNOSIS — B351 Tinea unguium: Secondary | ICD-10-CM | POA: Diagnosis not present

## 2016-12-06 DIAGNOSIS — M79676 Pain in unspecified toe(s): Secondary | ICD-10-CM

## 2016-12-06 NOTE — Progress Notes (Signed)
Patient ID: Jaime [name], female   DOB: [birth year], 51 y.o.   MRN: [id number] Complaint:  Visit Type: Patient returns to my office for continued preventative foot care services. Complaint: Patient states" my nails have grown long and thick and become painful to walk and wear shoes" Patient has been diagnosed with DM with no foot complications. The patient presents for preventative foot care services. No changes to ROS  Podiatric Exam: Vascular: dorsalis pedis and posterior tibial pulses are palpable bilateral. Capillary return is immediate. Temperature gradient is WNL. Skin turgor WNL  Sensorium: Normal Semmes Weinstein monofilament test. Normal tactile sensation bilaterally. Nail Exam: Pt has thick disfigured discolored nails with subungual debris noted bilateral entire nail hallux through fifth toenails Ulcer Exam: There is no evidence of ulcer or pre-ulcerative changes or infection. Orthopedic Exam: Muscle tone and strength are WNL. No limitations in general ROM. No crepitus or effusions noted. Foot type and digits show no abnormalities. Bony prominences are unremarkable. Skin: No Porokeratosis. No infection or ulcers  Diagnosis:  Onychomycosis, , Pain in right toe, pain in left toes  Treatment & Plan Procedures and Treatment: Consent by patient was obtained for treatment procedures. The patient understood the discussion of treatment and procedures well. All questions were answered thoroughly reviewed. Debridement of mycotic and hypertrophic toenails, 1 through 5 bilateral and clearing of subungual debris. No ulceration, no infection noted.  Return Visit-Office Procedure: Patient instructed to return to the office for a follow up visit 3 months for continued evaluation and treatment.  Gardiner Barefoot DPM

## 2016-12-11 ENCOUNTER — Other Ambulatory Visit: Payer: Self-pay | Admitting: *Deleted

## 2016-12-11 ENCOUNTER — Other Ambulatory Visit: Payer: Self-pay

## 2016-12-11 MED ORDER — POTASSIUM CHLORIDE CRYS ER 20 MEQ PO TBCR: 20.0000 meq | EXTENDED_RELEASE_TABLET | Freq: Every day | ORAL | 1 refills | Status: DC

## 2016-12-11 MED ORDER — INSULIN ASPART 100 UNIT/ML FLEXPEN: 3.0000 [IU] | PEN_INJECTOR | Freq: Three times a day (TID) | SUBCUTANEOUS | 1 refills | Status: DC

## 2016-12-11 MED ORDER — GLUCOSE BLOOD VI STRP: ORAL_STRIP | 2 refills | Status: DC

## 2016-12-13 ENCOUNTER — Other Ambulatory Visit: Payer: Self-pay | Admitting: *Deleted

## 2016-12-13 ENCOUNTER — Other Ambulatory Visit: Payer: Self-pay | Admitting: Oncology

## 2016-12-13 DIAGNOSIS — C712 Malignant neoplasm of temporal lobe: Secondary | ICD-10-CM

## 2016-12-13 MED ORDER — DEXAMETHASONE 2 MG PO TABS: 2.0000 mg | ORAL_TABLET | Freq: Every day | ORAL | 0 refills | Status: DC

## 2016-12-24 ENCOUNTER — Other Ambulatory Visit: Payer: Self-pay | Admitting: Oncology

## 2017-01-08 ENCOUNTER — Encounter: Payer: Medicaid Other | Admitting: Internal Medicine

## 2017-01-09 ENCOUNTER — Ambulatory Visit: Payer: Medicaid Other | Admitting: Oncology

## 2017-01-10 ENCOUNTER — Other Ambulatory Visit: Payer: Self-pay | Admitting: *Deleted

## 2017-01-10 MED ORDER — DEXAMETHASONE 2 MG PO TABS: 2.0000 mg | ORAL_TABLET | Freq: Every day | ORAL | 0 refills | Status: DC

## 2017-01-10 MED ORDER — MONTELUKAST SODIUM 10 MG PO TABS: 10.0000 mg | ORAL_TABLET | Freq: Every day | ORAL | 2 refills | Status: DC

## 2017-01-18 ENCOUNTER — Other Ambulatory Visit: Payer: Self-pay

## 2017-01-18 ENCOUNTER — Ambulatory Visit: Payer: Medicaid Other

## 2017-01-18 ENCOUNTER — Ambulatory Visit: Payer: Medicaid Other | Admitting: Internal Medicine

## 2017-01-18 ENCOUNTER — Encounter (INDEPENDENT_AMBULATORY_CARE_PROVIDER_SITE_OTHER): Payer: Self-pay

## 2017-01-18 VITALS — BP 125/84 | HR 115 | Temp 98.5°F | Ht 62.5 in | Wt 156.8 lb

## 2017-01-18 DIAGNOSIS — M79652 Pain in left thigh: Secondary | ICD-10-CM

## 2017-01-18 DIAGNOSIS — M7918 Myalgia, other site: Secondary | ICD-10-CM

## 2017-01-18 DIAGNOSIS — M79605 Pain in left leg: Secondary | ICD-10-CM

## 2017-01-18 DIAGNOSIS — E11319 Type 2 diabetes mellitus with unspecified diabetic retinopathy without macular edema: Secondary | ICD-10-CM

## 2017-01-18 DIAGNOSIS — Z794 Long term (current) use of insulin: Secondary | ICD-10-CM

## 2017-01-18 MED ORDER — NAPROXEN 375 MG PO TABS: 375.0000 mg | ORAL_TABLET | Freq: Three times a day (TID) | ORAL | 0 refills | Status: DC

## 2017-01-18 NOTE — Progress Notes (Signed)
   CC: Diabetes, leg pain  HPI:  Jaime [name] is a 51 y.o. F with PMHx listed below presenting for Diabetes, leg pain. Please see the A&P for the status of the patient's chronic medical problems.  Patient reports 4 days of 8/10 achy left thigh pain. The pain radiates down her anterior thigh down past her knee and is intermittent, lasting for about an hour at a time. She states she has taken tylenol for the pain, which has helped. She denies numbness, tingling, swelling, headaches. She does report a fall 4 weeks ago but did not experience any pain until 4 days ago.  Past Medical History:  Diagnosis Date  . Candida, oral 06/22/2016   Due to steroids 06/22/16  . Diabetes mellitus   . DUB (dysfunctional uterine bleeding)    total abdominal hysterectomy in 2007, ovaries intact  . Furuncle of multiple sites    multiple I and D's  . GERD (gastroesophageal reflux disease)   . Hydradenitis   . Intellectual disability   . 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  Physical Exam:  Vitals:   01/18/17 1002  BP: 125/84  Pulse: (!) 115  Temp: 98.5 F (36.9 C)  TempSrc: Oral  SpO2: 99%  Weight: 156 lb 12.8 oz (71.1 kg)  Height: 5' 2.5" (1.588 m)   Physical Exam  Constitutional: She appears well-developed and well-nourished.  Eyes: EOM are normal. Right eye exhibits no discharge. Left eye exhibits no discharge.  Pulmonary/Chest: Effort normal. No respiratory distress.  Musculoskeletal: She exhibits no edema or deformity.  Tenderness to palpation of Anterior Left thigh No tenderness of RLE    Assessment & Plan:   See Encounters Tab for problem based charting.  Patient seen with Dr. Lynnae January

## 2017-01-18 NOTE — Patient Instructions (Addendum)
Thank you for allowing [place] to care for you  For your diabetes: - Your meter showed you were within normal range 72% of the time with no lows - Continue to take Lantus 17 Units at bedtime and Novolog 5 units, three times a day with meals  For your left leg pain: - We have provided a prescription for naproxen to be taken every 8 hours as needed for pain; take this medication with food.  Please contact the clinic if you experience worsening symptoms  Please follow up with your PCP in the next 1-2 months

## 2017-01-18 NOTE — Assessment & Plan Note (Addendum)
Patient brought glucometer today for blood sugar check. Her blood sugar has been at goal 72% of the time with no hypoglycemia. AM glucose remains in the 170s-180s with daytime glucose measurement typically 130s-140s. Patient remain well controlled without hypoglycemic episodes. Of note, her decadron prescribed by her oncologist has been tapered to 2mg  Daily. - Continue Lantus 17 Units, qhs - Continue Novolog 5 Units, TID AC - Continue Metformin 1000mg , BID - Follow up with PCP in 1-2 months

## 2017-01-18 NOTE — Assessment & Plan Note (Addendum)
Patient reports 4 days of 8/10 achy left thigh pain. The pain radiates down her anterior thigh down past her knee and is intermittent, lasting for about an hour at a time. She states she has taken tylenol for the pain, which has helped. She denies numbness, tingling, swelling, headaches. She does report a fall 4 weeks ago but did not experience any pain until 4 days ago.  Patient's pain is muscular in nature and has responded to tylenol. Will provided naproxen for pain relief with instruction to reach out if her symptoms worsen. Patient instructed to take this medication with food. - Naproxen 375mg , q8h PRN for Pain, #90

## 2017-01-19 NOTE — Progress Notes (Signed)
Internal Medicine Clinic Attending  I saw and evaluated the patient.  I personally confirmed the key portions of the history and exam documented by Dr. Melvin and I reviewed pertinent patient test results.  The assessment, diagnosis, and plan were formulated together and I agree with the documentation in the resident's note.  

## 2017-01-22 ENCOUNTER — Telehealth: Payer: Self-pay | Admitting: Nurse Practitioner

## 2017-01-22 ENCOUNTER — Ambulatory Visit (HOSPITAL_BASED_OUTPATIENT_CLINIC_OR_DEPARTMENT_OTHER): Payer: Medicaid Other | Admitting: Oncology

## 2017-01-22 ENCOUNTER — Other Ambulatory Visit: Payer: Self-pay | Admitting: *Deleted

## 2017-01-22 VITALS — BP 134/88 | HR 115 | Temp 99.2°F | Resp 18 | Ht 62.5 in | Wt 157.5 lb

## 2017-01-22 DIAGNOSIS — C7931 Secondary malignant neoplasm of brain: Secondary | ICD-10-CM

## 2017-01-22 DIAGNOSIS — D496 Neoplasm of unspecified behavior of brain: Secondary | ICD-10-CM

## 2017-01-22 DIAGNOSIS — C801 Malignant (primary) neoplasm, unspecified: Secondary | ICD-10-CM

## 2017-01-22 NOTE — Progress Notes (Signed)
  [place] OFFICE PROGRESS NOTE   Diagnosis: Glioblastoma  INTERVAL HISTORY:   Jaime [name] returns as scheduled.  She continues Decadron at a dose of 2 mg daily.  She remains on an insulin sliding scale.  No headaches or nausea.  She reports left leg discomfort for the past 1-1/2 weeks.  She has not noted leg swelling.  She is taking naproxen for the pain.  No seizures.  No other complaint.  She remains in the [facility] hospice program.  Objective:  Vital signs in last 24 hours:  Blood pressure 134/88, pulse (!) 115, temperature 99.2 F (37.3 C), temperature source Oral, resp. rate 18, height 5' 2.5" (1.588 m), weight 157 lb 8 oz (71.4 kg), last menstrual period 10/31/2005, SpO2 98 %.    HEENT: Mild white coat over the tongue, no buccal thrush Resp: Lungs clear bilaterally Cardio: Regular rate and rhythm GI: No hepatosplenomegaly, nontender Vascular: The left lower leg is larger than the right side, no erythema or tenderness Neuro: The motor exam appears intact in the upper and lower extremities bilaterally     Medications: I have reviewed the patient's current medications.   Assessment/Plan: 1. History of iron deficiency anemia. Mild normocytic anemia during hospital admission May 2018 2. Sickle cell trait. 3. Status post hysterectomy. 4. History of dysphagia. She underwent an endoscopy by Dr. Collene Mares.  5. Diabetes 6. 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 History of Nausea/vomiting and headaches secondary to #6. Improved. 8. Dry skin rash both hands. 9. Viral appearing lesions  right medial buttock. Status post 7 day course of Valtrex-healed 10. History of markedly elevated BUN/creatinine-likely related to dehydration versus GI bleeding 11. Severe anemia, mild thrombocytopenia-potentially related to temozolomide/radiation or primary bone marrow process; Bone marrow biopsy 08/23/2016 with normocellular marrow for age with trilineage hematopoiesis.The hemoglobin and platelet count recovered   Disposition: She appears stable.  She will continue Decadron at a dose of 2 mg daily.  No clinical evidence for progression of the glioblastoma.  She reports discomfort in the left leg today.  The left leg appears swollen below the knee.  We will refer her for a Doppler of the left leg.  We will initiate anticoagulation as indicated.  Ms. Down will return for an office visit in approximately 6 weeks.  Betsy Coder, MD  01/22/2017  12:49 PM

## 2017-01-22 NOTE — Progress Notes (Signed)
error 

## 2017-01-22 NOTE — Telephone Encounter (Signed)
Gave avs and calendar for February  °

## 2017-01-24 ENCOUNTER — Ambulatory Visit (HOSPITAL_COMMUNITY)
Admission: RE | Admit: 2017-01-24 | Discharge: 2017-01-24 | Disposition: A | Discharge status: Home / Self Care | Payer: Medicaid Other | Source: Ambulatory Visit | Attending: Oncology | Admitting: Oncology

## 2017-01-24 ENCOUNTER — Encounter (HOSPITAL_COMMUNITY): Payer: Medicaid Other

## 2017-01-24 DIAGNOSIS — D496 Neoplasm of unspecified behavior of brain: Secondary | ICD-10-CM | POA: Insufficient documentation

## 2017-01-24 NOTE — Progress Notes (Signed)
*  PRELIMINARY RESULTS* Vascular Ultrasound Left lower extremity venous duplex has been completed.  Preliminary findings: No evidence of deep vein thrombosis in the visualized veins of the left lower extremity.  Negative for baker's cyst.  Preliminary results called to Dr. Gearldine Shown office at 14:41, given to [place].   Everrett Coombe 01/24/2017, 2:40 PM

## 2017-01-25 ENCOUNTER — Other Ambulatory Visit: Payer: Self-pay

## 2017-01-25 ENCOUNTER — Telehealth: Payer: Self-pay

## 2017-01-25 ENCOUNTER — Encounter: Payer: Self-pay | Admitting: Oncology

## 2017-01-25 NOTE — Progress Notes (Signed)
Received note from RN regarding Singulair refill and insurance coverage.  Verified member covered through [employer] for [place] Medicaid. Patient eligible under [facility] Medicaid.  Called [place] [facility]([place]) Interaction ID [id number] who states the pharmacy needs to contact them directly to be instructed what to do.  Called [facility]([place]) to adviser and he states he would contact them.

## 2017-01-25 NOTE — Telephone Encounter (Signed)
Spoke with Jerrel Ivory at [facility] on [address] regarding refills ordered on Singulair for patient. He stated that the medication "needs PA authorization". Voiced understanding and told him I'd follow up and call back.   Saw note in patient chart from Stefanie Libel on 11/29/16. Called back and relayed information to [place]. He stated that "Medicaid is no longer the primary insurance, we will need a new insurance card on file". Voiced understanding.   Spoke with [place] from [facility] and relayed information on refill from [place]. She states "we've had this problem before". She stated that she would follow up if this RN received same answer from [facility].   Prescription given to Care management for PA for refill.

## 2017-01-26 ENCOUNTER — Encounter: Payer: Self-pay | Admitting: Oncology

## 2017-01-26 NOTE — Progress Notes (Signed)
Received PA request for Singulair via fax from [facility].  Called the number listed which was pharmacy support to follow up if it had been taken care of. Representative told me that it wasn't listed as a covered medication and because it was a Hospice patient, someone from Hospice would need to call them to complete the next steps.  Relayed information to Melia(RN).

## 2017-01-31 ENCOUNTER — Other Ambulatory Visit: Payer: Self-pay | Admitting: Nurse Practitioner

## 2017-02-11 ENCOUNTER — Telehealth: Payer: Self-pay | Admitting: Internal Medicine

## 2017-02-11 DIAGNOSIS — M79605 Pain in left leg: Secondary | ICD-10-CM

## 2017-02-12 NOTE — Telephone Encounter (Signed)
It would be unusual for a muscle ache to last this long and require this much naproxen.  The symptoms didn't match the diagnosis at least what was documented, I would like her to come in for reevaluation if this pain is persisting.  I will write for 2 more weeks

## 2017-02-13 ENCOUNTER — Other Ambulatory Visit: Payer: Self-pay | Admitting: *Deleted

## 2017-02-13 MED ORDER — INSULIN PEN NEEDLE 31G X 5 MM MISC: 10 refills | Status: DC

## 2017-02-13 MED ORDER — GLUCOSE BLOOD VI STRP: ORAL_STRIP | 12 refills | Status: DC

## 2017-02-13 NOTE — Telephone Encounter (Signed)
Just spoke with patient's mother, Emmaline Kluver.  Agreed to bring patient in for an appointment on 02/26/17 at 10:15 am in [facility].

## 2017-02-13 NOTE — Telephone Encounter (Signed)
Please schedule pt an appt

## 2017-02-13 NOTE — Telephone Encounter (Signed)
Thanks much 

## 2017-02-15 ENCOUNTER — Ambulatory Visit (INDEPENDENT_AMBULATORY_CARE_PROVIDER_SITE_OTHER): Payer: Medicaid Other | Admitting: Dietician

## 2017-02-15 DIAGNOSIS — Z713 Dietary counseling and surveillance: Secondary | ICD-10-CM | POA: Diagnosis not present

## 2017-02-15 DIAGNOSIS — Z794 Long term (current) use of insulin: Secondary | ICD-10-CM

## 2017-02-15 DIAGNOSIS — E11319 Type 2 diabetes mellitus with unspecified diabetic retinopathy without macular edema: Secondary | ICD-10-CM | POA: Diagnosis not present

## 2017-02-16 NOTE — Progress Notes (Signed)
Diabetes Self-Management Education  Visit Type: First/Initial  Appt. Start Time: 1045 Appt. End Time: 1115  02/16/2017  Jaime [name], identified by name and date of birth, is a 52 y.o. female with a diagnosis of Diabetes: Type 2.   ASSESSMENT  [name] takes metformin, lantus and Novolog for her diabetes Eye exam shows due in chart Lab Results  Component Value Date   HGBA1C 6.4 (H) 08/20/2016     Diabetes Self-Management Education - 02/16/17 1100      Visit Information   Visit Type  First/Initial      Initial Visit   Diabetes Type  Type 2    Are you currently following a meal plan?  Yes    What type of meal plan do you follow?  healhty food choices, 3 meals a day    Are you taking your medications as prescribed?  Yes      Psychosocial Assessment   Patient Belief/Attitude about Diabetes  Motivated to manage diabetes    Self-care barriers  Debilitated state due to current medical condition;Unsteady gait/risk for falls;Lack of material resources    Self-management support  Doctor's office;Family;CDE visits    Other persons present  Parent    Patient Concerns  Nutrition/Meal planning    Preferred Learning Style  No preference indicated    How often do you need to have someone help you when you read instructions, pamphlets, or other written materials from your doctor or pharmacy?  3 - Sometimes      Pre-Education Assessment   Patient understands incorporating nutritional management into lifestyle.  Needs Review      Complications   Last HgB A1C per patient/outside source  6.4 %    Have you had a dilated eye exam in the past 12 months?  Yes  (Pended)     Have you had a dental exam in the past 12 months?  No  (Pended)     Are you checking your feet?  Yes  (Pended)        Individualized Plan for Diabetes Self-Management Training:   Learning Objective:  Patient will have a greater understanding of diabetes self-management. Patient education plan is to attend  individual and/or group sessions per assessed needs and concerns.  My plan to support myself in continuing these changes to care for my diabetes is to attend or contact:   Diabetes Support Groups  Type 2 diabetes support group : 2nd Monday of every month from 6-7 PM at [address] [facility] conference room [phone]   -doctor's office, CDE, Dietitian, pharmacist, church .  Plan:   There are no Patient Instructions on file for this visit.  Expected Outcomes:     Education material provided: My Plate and Carbohydrate counting sheet  If problems or questions, patient to contact team via:  Phone  Future DSME appointment:  4 weeks Debera Lat, RD 02/16/2017 11:56 AM.

## 2017-02-16 NOTE — Patient Instructions (Signed)
Good to see you Jaime [name]!  I signed you up for the class in February 21.   Please call if you have any questions or concerns.  Take care,  Butch Penny [phone]

## 2017-02-19 ENCOUNTER — Other Ambulatory Visit: Payer: Self-pay | Admitting: Radiation Oncology

## 2017-02-20 ENCOUNTER — Other Ambulatory Visit: Payer: Self-pay | Admitting: *Deleted

## 2017-02-20 DIAGNOSIS — E11319 Type 2 diabetes mellitus with unspecified diabetic retinopathy without macular edema: Secondary | ICD-10-CM

## 2017-02-20 MED ORDER — METFORMIN HCL 1000 MG PO TABS: 1000.0000 mg | ORAL_TABLET | Freq: Two times a day (BID) | ORAL | 2 refills | Status: DC

## 2017-02-20 NOTE — Telephone Encounter (Signed)
Refilled

## 2017-02-26 ENCOUNTER — Ambulatory Visit: Payer: Medicaid Other | Admitting: Internal Medicine

## 2017-02-26 ENCOUNTER — Other Ambulatory Visit: Payer: Self-pay

## 2017-02-26 VITALS — BP 109/73 | HR 101 | Temp 98.5°F | Ht 62.5 in | Wt 159.3 lb

## 2017-02-26 DIAGNOSIS — Z23 Encounter for immunization: Secondary | ICD-10-CM | POA: Diagnosis not present

## 2017-02-26 DIAGNOSIS — Z0000 Encounter for general adult medical examination without abnormal findings: Secondary | ICD-10-CM

## 2017-02-26 DIAGNOSIS — M79605 Pain in left leg: Secondary | ICD-10-CM

## 2017-02-26 MED ORDER — ACETAMINOPHEN 500 MG PO TABS: 500.0000 mg | ORAL_TABLET | Freq: Four times a day (QID) | ORAL | 3 refills | Status: DC | PRN

## 2017-02-26 NOTE — Assessment & Plan Note (Signed)
Assessment: Left anterior leg pain Patient was assessed on 12/20 for a 4 day history of achy left thigh pain with radiation from the anterior thigh past her knee. Today she states that the pain has completely resolved. She states she does not need naproxen and will take Tylenol as needed for pain.  Plan -Tylenol when necessary every 6 for pain

## 2017-02-26 NOTE — Assessment & Plan Note (Signed)
Assessment: Healthcare maintenance Patient is due for second series of pneumococcal vaccine. Offered today and patient accepted.  Plan -Pneumococcal polysaccharide vaccine 23-valent given in office

## 2017-02-26 NOTE — Progress Notes (Signed)
   CC: Follow up on anterior left leg pain  HPI:  [name] is a 52 y.o. female with history noted below the presents the acute care clinic for follow-up on anterior left leg pain. Please see problem based charting for the status of patient's chronic medical condition.  Past Medical History:  Diagnosis Date  . Candida, oral 06/22/2016   Due to steroids 06/22/16  . Diabetes mellitus   . DUB (dysfunctional uterine bleeding)    total abdominal hysterectomy in 2007, ovaries intact  . Furuncle of multiple sites    multiple I and D's  . GERD (gastroesophageal reflux disease)   . Hydradenitis   . Intellectual disability   . 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  Neurological: Negative for weakness.     Physical Exam:  Vitals:   02/26/17 1046  BP: 109/73  Pulse: (!) 101  Temp: 98.5 F (36.9 C)  TempSrc: Oral  SpO2: 97%  Weight: 159 lb 4.8 oz (72.3 kg)  Height: 5' 2.5" (1.588 m)   Physical Exam  Constitutional: She is well-developed, well-nourished, and in no distress.  Cardiovascular: Normal rate, regular rhythm and normal heart sounds. Exam reveals no gallop and no friction rub.  No murmur heard. Pulmonary/Chest: Effort normal and breath sounds normal. No respiratory distress. She has no wheezes. She has no rales.  Musculoskeletal:  5/5 motor strength bilaterally in lower extremities No pain noted on extension or flexion of left leg with resistance  Neurological: Gait normal.    Assessment & Plan:   See encounters tab for problem based medical decision making.    Patient discussed with Dr. Angelia Mould

## 2017-02-26 NOTE — Patient Instructions (Signed)
Ms. Glick,  It was a pleasure meeting you today. Please start taking Tylenol as needed if your leg pain returns. Please follow up with your primary care physician.

## 2017-02-27 ENCOUNTER — Other Ambulatory Visit: Payer: Self-pay | Admitting: Nurse Practitioner

## 2017-02-27 NOTE — Progress Notes (Signed)
Internal Medicine Clinic Attending  Case discussed with Dr. Radin Raptis at the time of the visit.  We reviewed the resident's history and exam and pertinent patient test results.  I agree with the assessment, diagnosis, and plan of care documented in the resident's note.  

## 2017-03-05 ENCOUNTER — Encounter: Payer: Self-pay | Admitting: Nurse Practitioner

## 2017-03-05 ENCOUNTER — Inpatient Hospital Stay: Payer: Medicaid Other | Attending: Nurse Practitioner | Admitting: Nurse Practitioner

## 2017-03-05 VITALS — BP 125/84 | HR 108 | Temp 99.0°F | Resp 18 | Ht 62.5 in | Wt 157.4 lb

## 2017-03-05 DIAGNOSIS — C712 Malignant neoplasm of temporal lobe: Secondary | ICD-10-CM | POA: Insufficient documentation

## 2017-03-05 DIAGNOSIS — E119 Type 2 diabetes mellitus without complications: Secondary | ICD-10-CM | POA: Diagnosis not present

## 2017-03-05 DIAGNOSIS — Z79899 Other long term (current) drug therapy: Secondary | ICD-10-CM | POA: Diagnosis not present

## 2017-03-05 DIAGNOSIS — Z923 Personal history of irradiation: Secondary | ICD-10-CM | POA: Diagnosis not present

## 2017-03-05 DIAGNOSIS — Z9221 Personal history of antineoplastic chemotherapy: Secondary | ICD-10-CM | POA: Insufficient documentation

## 2017-03-05 DIAGNOSIS — D573 Sickle-cell trait: Secondary | ICD-10-CM | POA: Insufficient documentation

## 2017-03-05 DIAGNOSIS — D509 Iron deficiency anemia, unspecified: Secondary | ICD-10-CM | POA: Insufficient documentation

## 2017-03-05 DIAGNOSIS — Z90710 Acquired absence of both cervix and uterus: Secondary | ICD-10-CM | POA: Diagnosis not present

## 2017-03-05 NOTE — Progress Notes (Signed)
  [place] OFFICE PROGRESS NOTE   Diagnosis: Glioblastoma  INTERVAL HISTORY:   Jaime [name] returns as scheduled.  She continues Decadron 2 mg daily.  She overall feels well.  She denies headaches.  No vision change.  No balance problems.  No nausea or vomiting.  The left leg pain she was experiencing at the time of her last visit has resolved.  Objective:  Vital signs in last 24 hours:  Blood pressure 125/84, pulse (!) 108, temperature 99 F (37.2 C), temperature source Oral, resp. rate 18, height 5' 2.5" (1.588 m), weight 157 lb 6.4 oz (71.4 kg), last menstrual period 10/31/2005, SpO2 97 %.    HEENT: Pupils equal round and reactive to light.  No thrush. Resp: Lungs clear bilaterally. Cardio: Regular rate and rhythm. GI: Abdomen soft and nontender.  No hepatomegaly. Vascular: No leg edema. Neuro: Alert and oriented.   Lab Results:  Lab Results  Component Value Date   WBC 6.0 09/13/2016   HGB 11.4 (L) 09/13/2016   HCT 34.1 (L) 09/13/2016   MCV 93.6 09/13/2016   PLT 262 09/13/2016   NEUTROABS 4.1 09/13/2016    Imaging:  No results found.  Medications: I have reviewed the patient's current medications.  Assessment/Plan: 1. History of iron deficiency anemia. Mild normocytic anemia during hospital admission May 2018 2. Sickle cell trait. 3. Status post hysterectomy. 4. History of dysphagia. She underwent an endoscopy by Dr. Collene Mares.  5. Diabetes 6. 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 History of Nausea/vomiting and headaches secondary to #6. Improved. 8. Dry skin rash both  hands. 9. Viral appearing lesions right medial buttock. Status post 7 day course of Valtrex-healed 10. History of markedly elevated BUN/creatinine-likely related to dehydration versus GI bleeding 11. Severe anemia, mild thrombocytopenia-potentially related to temozolomide/radiation or primary bone marrow process; Bone marrow biopsy 08/23/2016 with normocellular marrow for age with trilineage hematopoiesis.The hemoglobin and platelet count recovered    Disposition: Jaime [name] appears stable.  There is no clinical evidence for progression of the glioblastoma.  She will continue Decadron 2 mg daily.  We are referring her for a restaging MRI of the brain in approximately 4 weeks.  She will return for a follow-up visit a few days later to review the results.  She will contact the office in the interim with any problems.  Plan reviewed with Dr. Benay Spice.    Ned Card ANP/GNP-BC   03/05/2017  12:41 PM

## 2017-03-07 ENCOUNTER — Telehealth: Payer: Self-pay | Admitting: *Deleted

## 2017-03-07 NOTE — Telephone Encounter (Signed)
Call from [place], [place] with [facility]: Pt will need to revoke hospice benefit or pay out of pocket for CT scan. Dr. Benay Spice made aware, left message for pt's mother to call office to discuss.

## 2017-03-08 ENCOUNTER — Other Ambulatory Visit: Payer: Self-pay | Admitting: Nurse Practitioner

## 2017-03-08 DIAGNOSIS — C712 Malignant neoplasm of temporal lobe: Secondary | ICD-10-CM

## 2017-03-09 NOTE — Telephone Encounter (Signed)
Left message for pt's mother to call office. See note from 03/07/17.

## 2017-03-22 ENCOUNTER — Ambulatory Visit: Payer: Medicaid Other | Admitting: Dietician

## 2017-04-02 ENCOUNTER — Ambulatory Visit (HOSPITAL_COMMUNITY): Admission: RE | Admit: 2017-04-02 | Payer: Medicaid Other | Source: Ambulatory Visit

## 2017-04-02 ENCOUNTER — Other Ambulatory Visit: Payer: Self-pay | Admitting: Nurse Practitioner

## 2017-04-02 ENCOUNTER — Ambulatory Visit: Payer: Medicaid Other | Admitting: Nurse Practitioner

## 2017-04-02 ENCOUNTER — Other Ambulatory Visit: Payer: Medicaid Other

## 2017-04-03 ENCOUNTER — Other Ambulatory Visit: Payer: Medicaid Other

## 2017-04-03 ENCOUNTER — Telehealth: Payer: Self-pay | Admitting: *Deleted

## 2017-04-03 ENCOUNTER — Ambulatory Visit: Payer: Medicaid Other | Admitting: Oncology

## 2017-04-03 NOTE — Telephone Encounter (Signed)
Left message on voicemail for pt's mother to call office re: missed appt.

## 2017-04-05 ENCOUNTER — Telehealth: Payer: Self-pay | Admitting: *Deleted

## 2017-04-05 NOTE — Telephone Encounter (Signed)
Message from [place], [place] case manager reporting pt is coming up on a new hospice benefit period. Will need orders to renew.  Reviewed with Dr. Benay Spice: Would like to see pt in office prior to renewing hospice benefit.  Left message on voicemail for Abigail Butts with above information. Requested she have pt's mother call office.

## 2017-04-09 ENCOUNTER — Ambulatory Visit (HOSPITAL_COMMUNITY)
Admission: RE | Admit: 2017-04-09 | Discharge: 2017-04-09 | Disposition: A | Discharge status: Home / Self Care | Payer: Medicaid Other | Source: Ambulatory Visit | Attending: Nurse Practitioner | Admitting: Nurse Practitioner

## 2017-04-09 ENCOUNTER — Other Ambulatory Visit: Payer: Self-pay | Admitting: Internal Medicine

## 2017-04-09 DIAGNOSIS — R930 Abnormal findings on diagnostic imaging of skull and head, not elsewhere classified: Secondary | ICD-10-CM | POA: Diagnosis not present

## 2017-04-09 DIAGNOSIS — C712 Malignant neoplasm of temporal lobe: Secondary | ICD-10-CM | POA: Diagnosis not present

## 2017-04-09 LAB — CREATININE, SERUM
CREATININE: 0.79 mg/dL (ref 0.44–1.00)
GFR calc non Af Amer: >60 mL/min (ref >60)

## 2017-04-09 MED ORDER — GADOBENATE DIMEGLUMINE 529 MG/ML IV SOLN
15.0000 mL | Freq: Once | INTRAVENOUS | Status: AC | PRN
  Administered 2017-04-09: 15 mL via INTRAVENOUS

## 2017-04-09 MED ORDER — INSULIN GLARGINE 100 UNITS/ML SOLOSTAR PEN: 17.0000 [IU] | PEN_INJECTOR | Freq: Every day | SUBCUTANEOUS | 3 refills | Status: DC

## 2017-04-09 MED ORDER — INSULIN ASPART 100 UNIT/ML FLEXPEN: 3.0000 [IU] | PEN_INJECTOR | Freq: Three times a day (TID) | SUBCUTANEOUS | 3 refills | Status: DC

## 2017-04-09 NOTE — Telephone Encounter (Signed)
refilled 

## 2017-04-09 NOTE — Telephone Encounter (Signed)
PATIENT NEEDS REFILL, NOVOLOG FLEX PEN, [place]

## 2017-04-10 ENCOUNTER — Ambulatory Visit: Payer: Medicaid Other | Admitting: Podiatry

## 2017-04-11 ENCOUNTER — Telehealth: Payer: Self-pay

## 2017-04-11 NOTE — Telephone Encounter (Signed)
No answer/ voicemail full °

## 2017-04-12 NOTE — Telephone Encounter (Signed)
Spoke with pt mother to inform that Dr. Benay Spice is able to see pt Monday afternoon. Also, per MD, MRI looks better overall. Mother voiced understanding. Scheduling message sent.

## 2017-04-16 ENCOUNTER — Encounter: Payer: Self-pay | Admitting: Nurse Practitioner

## 2017-04-16 ENCOUNTER — Inpatient Hospital Stay: Payer: Medicaid Other | Attending: Nurse Practitioner | Admitting: Nurse Practitioner

## 2017-04-16 ENCOUNTER — Telehealth: Payer: Self-pay | Admitting: Oncology

## 2017-04-16 VITALS — BP 104/75 | HR 110 | Temp 98.5°F | Resp 19 | Ht 62.5 in | Wt 149.4 lb

## 2017-04-16 DIAGNOSIS — Z9221 Personal history of antineoplastic chemotherapy: Secondary | ICD-10-CM | POA: Diagnosis not present

## 2017-04-16 DIAGNOSIS — R21 Rash and other nonspecific skin eruption: Secondary | ICD-10-CM | POA: Insufficient documentation

## 2017-04-16 DIAGNOSIS — E119 Type 2 diabetes mellitus without complications: Secondary | ICD-10-CM | POA: Diagnosis not present

## 2017-04-16 DIAGNOSIS — C712 Malignant neoplasm of temporal lobe: Secondary | ICD-10-CM | POA: Insufficient documentation

## 2017-04-16 DIAGNOSIS — D509 Iron deficiency anemia, unspecified: Secondary | ICD-10-CM | POA: Diagnosis not present

## 2017-04-16 DIAGNOSIS — Z923 Personal history of irradiation: Secondary | ICD-10-CM | POA: Insufficient documentation

## 2017-04-16 DIAGNOSIS — D573 Sickle-cell trait: Secondary | ICD-10-CM | POA: Insufficient documentation

## 2017-04-16 DIAGNOSIS — D696 Thrombocytopenia, unspecified: Secondary | ICD-10-CM | POA: Diagnosis not present

## 2017-04-16 DIAGNOSIS — Z90710 Acquired absence of both cervix and uterus: Secondary | ICD-10-CM | POA: Insufficient documentation

## 2017-04-16 DIAGNOSIS — D649 Anemia, unspecified: Secondary | ICD-10-CM | POA: Insufficient documentation

## 2017-04-16 DIAGNOSIS — R112 Nausea with vomiting, unspecified: Secondary | ICD-10-CM | POA: Insufficient documentation

## 2017-04-16 DIAGNOSIS — Z79899 Other long term (current) drug therapy: Secondary | ICD-10-CM | POA: Diagnosis not present

## 2017-04-16 NOTE — Progress Notes (Addendum)
Jaime [name] OFFICE PROGRESS NOTE   Diagnosis: Glioblastoma  INTERVAL HISTORY:   [name] returns as scheduled.  She feels well.  No seizures.  No headaches.  No falls.  She walks with a cane.  She had an episode of nausea/vomiting yesterday.  She attributes this to something that she ate.  Otherwise she has had no nausea or vomiting since her last visit.  Objective:  Vital signs in last 24 hours:  Blood pressure 104/75, pulse (!) 110, temperature 98.5 F (36.9 C), temperature source Oral, resp. rate 19, height 5' 2.5" (1.588 m), weight 149 lb 6.4 oz (67.8 kg), last menstrual period 10/31/2005, SpO2 97 %.    HEENT: No thrush or ulcers. Resp: Lungs clear bilaterally. Cardio: Regular rate and rhythm. GI: Abdomen soft and nontender.  No hepatomegaly. Vascular: No leg edema. Neuro: Alert and oriented.  Follows commands.   Lab Results:  Lab Results  Component Value Date   WBC 6.0 09/13/2016   HGB 11.4 (L) 09/13/2016   HCT 34.1 (L) 09/13/2016   MCV 93.6 09/13/2016   PLT 262 09/13/2016   NEUTROABS 4.1 09/13/2016    Imaging:  No results found.  Medications: I have reviewed the patient's current medications.  Assessment/[name]: 1. History of iron deficiency anemia. Mild normocytic anemia during hospital admission May 2018 2. Sickle cell trait. 3. Status post hysterectomy. 4. History of dysphagia. She underwent an endoscopy by Dr. Collene Mares.  5. Diabetes 6. Centrally necrotic, enhancing left temporal mass on MRI brain 06/18/2016-imaging features consistent with a high-grade glioma  CTs chest, abdomen, and pelvis 06/20/2016-no evidence of a primary tumor site or metastatic disease  Initiation of radiation/temozolomide 07/03/2016; Completed 07/24/2016  Restaging brain MRI 08/11/2016-mild increase in the size of the enhancing left temporal mass, decreased edema/mass effect  Brain MRI 04/09/2017-interval treatment response of primary left temporal lobe GBM.   Increased surrounding FLAIR T2 hyperintense signal extending through the splenium of corpus callosum to right periventricular white matter concerning for interval progression of nonenhancing tumor in a background of treatment-related changes.  New punctate enhancement and irregular signal right temporal horn periventricular white matter concerning for multifocal GBM. 7. History of Nausea/vomiting and headaches secondary to #6. Improved. 8. Dry skin rash both hands. 9. Viral appearing lesions right medial buttock. Status post 7 day course of Valtrex-healed 10. History of markedly elevated BUN/creatinine-likely related to dehydration versus GI bleeding 11. Severe anemia, mild thrombocytopenia-potentially related to temozolomide/radiation or primary bone marrow process; Bone marrow biopsy 08/23/2016 with normocellular marrow for age with trilineage hematopoiesis.The hemoglobin and platelet count recovered   Disposition: [name] appears stable.  Dr. Benay Spice has reviewed the recent brain MRI.  There is a significant improvement.  [name] is to follow with observation and repeat an MRI at a 2-68-monthinterval.  [name].  She is currently on dexamethasone 2 mg daily.  She will begin 2 mg every other day for 1 week and then discontinue.  [name] for follow-up MRI on 06/18/2017.  She will return for a follow-up visit on 06/21/2017.  She will contact the office in the interim with any problems.  Patient seen with Dr. SBenay Spice  CT images reviewed on the computer with Ms. LCalixtoand her [name].    LNed CardANP/GNP-BC   04/16/2017  2:52 PM  This was a shared visit with LNed Card  Ms. LSpadaforaappears asymptomatic from the brain tumor the MRI is improved significantly compared  to the last MRI from July 2018.  We reviewed the MRI images with [name].  I discussed the case and reviewed the images with neuro oncology.  [name] is to follow her with  observation.  We will consider a biopsy if there is clinical or radiologic progression.  She will be scheduled for a repeat brain MRI within the next 2-3 months.  Decadron will be tapered off.  She has been discharged from the hospice program.  Julieanne Manson, MD

## 2017-04-16 NOTE — Telephone Encounter (Signed)
Appointment scheduled AVS/Calendar printed/ Called to scheduled MRI that was ordered per 3/18 los

## 2017-04-19 ENCOUNTER — Ambulatory Visit: Payer: Medicaid Other | Admitting: Dietician

## 2017-04-30 ENCOUNTER — Encounter: Payer: Medicaid Other | Admitting: Internal Medicine

## 2017-04-30 ENCOUNTER — Encounter: Payer: Self-pay | Admitting: Internal Medicine

## 2017-05-02 ENCOUNTER — Other Ambulatory Visit: Payer: Self-pay | Admitting: Oncology

## 2017-05-25 ENCOUNTER — Encounter: Payer: Self-pay | Admitting: Podiatry

## 2017-05-25 ENCOUNTER — Ambulatory Visit: Payer: Medicaid Other | Admitting: Podiatry

## 2017-05-25 DIAGNOSIS — B351 Tinea unguium: Secondary | ICD-10-CM

## 2017-05-25 DIAGNOSIS — E1140 Type 2 diabetes mellitus with diabetic neuropathy, unspecified: Secondary | ICD-10-CM | POA: Diagnosis not present

## 2017-05-25 NOTE — Progress Notes (Signed)
Patient ID: [name], female   DOB: [birth year], 52 y.o.   MRN: [id number] Complaint:  Visit Type: Patient returns to my office for continued preventative foot care services. Complaint: Patient states" my nails have grown long and thick and become painful to walk and wear shoes" Patient has been diagnosed with DM with no foot complications. The patient presents for preventative foot care services. No changes to ROS  Podiatric Exam: Vascular: dorsalis pedis and posterior tibial pulses are palpable bilateral. Capillary return is immediate. Temperature gradient is WNL. Skin turgor WNL  Sensorium: Normal Semmes Weinstein monofilament test. Normal tactile sensation bilaterally. Nail Exam: Pt has thick disfigured discolored nails with subungual debris noted bilateral entire nail hallux through fifth toenails Ulcer Exam: There is no evidence of ulcer or pre-ulcerative changes or infection. Orthopedic Exam: Muscle tone and strength are WNL. No limitations in general ROM. No crepitus or effusions noted. Foot type and digits show no abnormalities. Bony prominences are unremarkable. Skin: No Porokeratosis. No infection or ulcers  Diagnosis:  Onychomycosis, , Pain in right toe, pain in left toes  Treatment & Plan Procedures and Treatment: Consent by patient was obtained for treatment procedures. The patient understood the discussion of treatment and procedures well. All questions were answered thoroughly reviewed. Debridement of mycotic and hypertrophic toenails, 1 through 5 bilateral and clearing of subungual debris. No ulceration, no infection noted.  Return Visit-Office Procedure: Patient instructed to return to the office for a follow up visit 3 months for continued evaluation and treatment.  Gardiner Barefoot DPM

## 2017-05-28 ENCOUNTER — Telehealth: Payer: Self-pay | Admitting: *Deleted

## 2017-05-28 DIAGNOSIS — C712 Malignant neoplasm of temporal lobe: Secondary | ICD-10-CM

## 2017-05-28 MED ORDER — DEXAMETHASONE 4 MG PO TABS: 8.0000 mg | ORAL_TABLET | Freq: Two times a day (BID) | ORAL | 0 refills | Status: DC

## 2017-05-28 NOTE — Telephone Encounter (Signed)
Message from pt's mother reporting she fell out of bed. Returned call, mother reports pt has been "unstable". Her legs are twitching. Mother reports she is having a difficult time with ambulation, has almost fallen several times. Mother also reports decreased appetite. Reviewed call with Dr. Benay Spice: We can work her in this afternoon or tomorrow morning or pt can go to ED for evaluation. Discussed with mother, she would like to bring pt in here tomorrow. Per Dr. Benay Spice: Begin Decadron 8mg  PO BID. Appt given for 4/30.

## 2017-05-29 ENCOUNTER — Inpatient Hospital Stay: Payer: Medicaid Other | Attending: Nurse Practitioner | Admitting: Oncology

## 2017-05-29 ENCOUNTER — Telehealth: Payer: Self-pay | Admitting: *Deleted

## 2017-05-29 ENCOUNTER — Other Ambulatory Visit: Payer: Self-pay | Admitting: *Deleted

## 2017-05-29 ENCOUNTER — Ambulatory Visit: Payer: Medicaid Other

## 2017-05-29 ENCOUNTER — Telehealth: Payer: Self-pay | Admitting: Oncology

## 2017-05-29 VITALS — BP 139/88 | HR 84 | Temp 97.8°F | Resp 17 | Ht 62.5 in | Wt 141.7 lb

## 2017-05-29 DIAGNOSIS — R410 Disorientation, unspecified: Secondary | ICD-10-CM | POA: Diagnosis not present

## 2017-05-29 DIAGNOSIS — Z923 Personal history of irradiation: Secondary | ICD-10-CM | POA: Diagnosis not present

## 2017-05-29 DIAGNOSIS — E119 Type 2 diabetes mellitus without complications: Secondary | ICD-10-CM | POA: Diagnosis not present

## 2017-05-29 DIAGNOSIS — R251 Tremor, unspecified: Secondary | ICD-10-CM | POA: Insufficient documentation

## 2017-05-29 DIAGNOSIS — C712 Malignant neoplasm of temporal lobe: Secondary | ICD-10-CM | POA: Diagnosis not present

## 2017-05-29 DIAGNOSIS — D573 Sickle-cell trait: Secondary | ICD-10-CM | POA: Diagnosis not present

## 2017-05-29 DIAGNOSIS — D509 Iron deficiency anemia, unspecified: Secondary | ICD-10-CM | POA: Insufficient documentation

## 2017-05-29 DIAGNOSIS — Z90710 Acquired absence of both cervix and uterus: Secondary | ICD-10-CM | POA: Diagnosis not present

## 2017-05-29 DIAGNOSIS — R531 Weakness: Secondary | ICD-10-CM | POA: Insufficient documentation

## 2017-05-29 DIAGNOSIS — Z9221 Personal history of antineoplastic chemotherapy: Secondary | ICD-10-CM | POA: Diagnosis not present

## 2017-05-29 DIAGNOSIS — Z79899 Other long term (current) drug therapy: Secondary | ICD-10-CM | POA: Insufficient documentation

## 2017-05-29 NOTE — Telephone Encounter (Signed)
Pt's mother declined appointment at [facility]. Would rather have MRI at [facility]. Lorazepam instructions reviewed, mother reports they have some at home.

## 2017-05-29 NOTE — Progress Notes (Signed)
Pt's mother requested to have MRI done at [place] moved up to 5/3 @ 1230. Case will be discussed in tumor board on 5/6, pt will see Dr. Mickeal Skinner 06/05/17.

## 2017-05-29 NOTE — Telephone Encounter (Signed)
aptts already scheduled per 4/30 los - 5/7 appt with Vaslow scheduled already - per Oren Beckmann leave appts as is .

## 2017-05-29 NOTE — Progress Notes (Signed)
[name] OFFICE PROGRESS NOTE   Diagnosis: Glioblastoma  INTERVAL HISTORY:   Jaime [name] returns prior to his scheduled visit.  Decadron was tapered to off after the office visit here 04/16/2017.  Jaime [name] is no longer enrolled in the [facility] hospice program. Jaime [name] presents today with Jaime [name].  Jaime [name] has a one-week history of left leg weakness and increased confusion.  Jaime [name] reports 3-4 episodes of "twitching" of the left lower leg over the past week.  These episodes last 3 to 4 minutes.  No other seizure activity.  Jaime [name] contacted the office yesterday with a report of the above symptoms.  Jaime [name] was placed on Decadron.  Jaime [name] reports Jaime [name] is less confused today.  Objective:  Vital signs in last 24 hours:  Blood pressure 139/88, pulse (!) 120, temperature 97.8 F (36.6 C), temperature source Oral, resp. rate 17, height 5' 2.5" (1.588 m), weight 141 lb 11.2 oz (64.3 kg), last menstrual period 10/31/2005, SpO2 97 %.   HEENT: No thrush Resp: Clear bilaterally Cardio: Regular rate and rhythm Vascular: No leg edema Neuro: Alert, follows commands, Jaime [name] favors the left leg when ambulating, the motor exam appears intact in the upper and lower extremities.  The face is symmetric.  Jaime [name] has difficulty answering questions and looks to Jaime [name] for answers.      Medications: I have reviewed the patient's current medications.   Assessment/Plan: 1. History of iron deficiency anemia. Mild normocytic anemia during hospital admission May 2018 2. Sickle cell trait. 3. Status post hysterectomy. 4. History of dysphagia. Jaime [name] underwent an endoscopy by Dr. Collene Mares.  5. Diabetes 6. Centrally necrotic, enhancing left temporal mass on MRI brain 06/18/2016-imaging features consistent with a high-grade glioma  CTs chest, abdomen, and pelvis 06/20/2016-no evidence of a primary tumor site or metastatic disease  Initiation of radiation/temozolomide 07/03/2016; Completed  07/24/2016  Restaging brain MRI 08/11/2016-mild increase in the size of the enhancing left temporal mass, decreased edema/mass effect  Brain MRI 04/09/2017-interval treatment response of primary left temporal lobe GBM.  Increased surrounding FLAIR T2 hyperintense signal extending through the splenium of corpus callosum to right periventricular white matter concerning for interval progression of nonenhancing tumor in a background of treatment-related changes.  New punctate enhancement and irregular signal right temporal horn periventricular white matter concerning for multifocal GBM.  Increased confusion, left-sided weakness 29 2019-Decadron resumed 7. History of Nausea/vomiting and headaches secondary to #6. Improved. 8. Dry skin rash both hands. 9. Viral appearing lesions right medial buttock. Status post 7 day course of Valtrex-healed 10. History of markedly elevated BUN/creatinine-likely related to dehydration versus GI bleeding 11. Severe anemia, mild thrombocytopenia-potentially related to temozolomide/radiation or primary bone marrow process; Bone marrow biopsy 08/23/2016 with normocellular marrow for age with trilineage hematopoiesis.The hemoglobin and platelet count recovered   Disposition: Jaime [name] has clinical evidence of disease progression.  Jaime [name] now has left-sided symptoms, concerning for cross midline progression of the GBM.  Jaime [name] was started back on Decadron yesterday and is partially improved today.  The left leg "tremors "may have been related to partial seizure activity.  Jaime [name] continues Keppra.  Hopefully the Decadron will control seizures.  We will refer Jaime [name] for an urgent MRI of the brain and schedule an appointment with Dr. Mickeal Skinner for as soon as possible.  Jaime [name] understands the Decadron will likely result in an elevated blood sugar.  They will contact us if the blood sugar returns at greater than 300.  Jaime [name] will return for an  office visit in 1 week.  25 minutes  were spent with the patient today.  The majority of the time was used for counseling and coordination of care.  Betsy Coder, MD  05/29/2017  12:45 PM

## 2017-05-30 ENCOUNTER — Telehealth: Payer: Self-pay | Admitting: *Deleted

## 2017-05-30 NOTE — Telephone Encounter (Signed)
Spoke with pt's mother, instructed her to check blood sugar every morning, call office if greater than 300- per Dr. Benay Spice. She voiced understanding. Stated she will check it now.

## 2017-05-31 ENCOUNTER — Other Ambulatory Visit: Payer: Self-pay | Admitting: Internal Medicine

## 2017-05-31 DIAGNOSIS — Z1231 Encounter for screening mammogram for malignant neoplasm of breast: Secondary | ICD-10-CM

## 2017-06-01 ENCOUNTER — Telehealth: Payer: Self-pay | Admitting: *Deleted

## 2017-06-01 ENCOUNTER — Ambulatory Visit (HOSPITAL_COMMUNITY): Payer: Medicaid Other

## 2017-06-01 ENCOUNTER — Ambulatory Visit (HOSPITAL_COMMUNITY)
Admission: RE | Admit: 2017-06-01 | Discharge: 2017-06-01 | Disposition: A | Discharge status: Home / Self Care | Payer: Medicaid Other | Source: Ambulatory Visit | Attending: Nurse Practitioner | Admitting: Nurse Practitioner

## 2017-06-01 DIAGNOSIS — C712 Malignant neoplasm of temporal lobe: Secondary | ICD-10-CM | POA: Diagnosis present

## 2017-06-01 DIAGNOSIS — G919 Hydrocephalus, unspecified: Secondary | ICD-10-CM | POA: Diagnosis not present

## 2017-06-01 LAB — CREATININE, SERUM
CREATININE: 0.52 mg/dL (ref 0.44–1.00)
GFR calc non Af Amer: >60 mL/min (ref >60)

## 2017-06-01 MED ORDER — GADOBENATE DIMEGLUMINE 529 MG/ML IV SOLN
15.0000 mL | Freq: Once | INTRAVENOUS | Status: AC | PRN
  Administered 2017-06-01: 15 mL via INTRAVENOUS

## 2017-06-01 NOTE — Telephone Encounter (Signed)
Left message on voicemail for pt's mother, MRI has been approved. Need to know if she can get her to radiology today so result can be reviewed in conference on 06/04/17.

## 2017-06-05 ENCOUNTER — Encounter: Payer: Self-pay | Admitting: Internal Medicine

## 2017-06-05 ENCOUNTER — Inpatient Hospital Stay: Payer: Medicaid Other | Attending: Nurse Practitioner | Admitting: Internal Medicine

## 2017-06-05 ENCOUNTER — Other Ambulatory Visit: Payer: Self-pay

## 2017-06-05 ENCOUNTER — Telehealth: Payer: Self-pay | Admitting: Internal Medicine

## 2017-06-05 DIAGNOSIS — Z803 Family history of malignant neoplasm of breast: Secondary | ICD-10-CM | POA: Diagnosis not present

## 2017-06-05 DIAGNOSIS — K219 Gastro-esophageal reflux disease without esophagitis: Secondary | ICD-10-CM

## 2017-06-05 DIAGNOSIS — Z9181 History of falling: Secondary | ICD-10-CM | POA: Diagnosis not present

## 2017-06-05 DIAGNOSIS — Z7982 Long term (current) use of aspirin: Secondary | ICD-10-CM | POA: Diagnosis not present

## 2017-06-05 DIAGNOSIS — E119 Type 2 diabetes mellitus without complications: Secondary | ICD-10-CM | POA: Diagnosis not present

## 2017-06-05 DIAGNOSIS — C712 Malignant neoplasm of temporal lobe: Secondary | ICD-10-CM

## 2017-06-05 DIAGNOSIS — G259 Extrapyramidal and movement disorder, unspecified: Secondary | ICD-10-CM | POA: Diagnosis not present

## 2017-06-05 DIAGNOSIS — Z79899 Other long term (current) drug therapy: Secondary | ICD-10-CM

## 2017-06-05 DIAGNOSIS — Z794 Long term (current) use of insulin: Secondary | ICD-10-CM

## 2017-06-05 DIAGNOSIS — Z801 Family history of malignant neoplasm of trachea, bronchus and lung: Secondary | ICD-10-CM

## 2017-06-05 DIAGNOSIS — R270 Ataxia, unspecified: Secondary | ICD-10-CM | POA: Diagnosis not present

## 2017-06-05 DIAGNOSIS — H547 Unspecified visual loss: Secondary | ICD-10-CM | POA: Diagnosis not present

## 2017-06-05 MED ORDER — LEVETIRACETAM 500 MG PO TABS: 1500.0000 mg | ORAL_TABLET | Freq: Two times a day (BID) | ORAL | 5 refills | Status: DC

## 2017-06-05 NOTE — Progress Notes (Signed)
START ON PATHWAY REGIMEN - Neuro     A cycle is every 14 days:     Bevacizumab   **Always confirm dose/schedule in your pharmacy ordering system**  Patient Characteristics: Glioblastoma, Recurrent or Progressive, Nonsurgical Candidate, Systemic Therapy Candidate Disease Classification: Glioma Disease Classification: Glioblastoma Disease Status: Recurrent or Progressive Treatment Classification: Nonsurgical Candidate Treatment (Nonsurgical/Adjuvant): Systemic Therapy Candidate Intent of Therapy: Non-Curative / Palliative Intent, Discussed with Patient 

## 2017-06-05 NOTE — Telephone Encounter (Signed)
Scheduled appt per 5/7 sch message - gave patient AVS and calender per los.

## 2017-06-05 NOTE — Progress Notes (Signed)
[facility], [place] [address] [phone]   Interval Evaluation  Date of Service: 06/05/17 Patient Name: Jaime [name] Patient MRN: [id number] Patient DOB: 04-25-1965 Provider: Ventura Sellers, MD  Identifying Statement:  Jaime [name] is a 52 y.o. female with left temporal glioblastoma    Interval History: The patient's records from the referring physician were obtained and reviewed and the patient interviewed to confirm this HPI.  Jaime [name] presents today with her mother and aunt for follow up at the request of Dr. Benay Spice.  She and her family describe recent clinical decline, described as "unsteady walking" with frequent falls, "bumping into furniture", and noted visual impairment.  They also described frequent/daily episodes of "blank staring" which last for less than 1 minute with quick return to baseline.  There are also frequent/daily episodes of involuntary shaking of the left leg, also lasting for ~1 minute.  These may be more frequent at night.  Continues to take Keppra and decadron recently started by Dr. Benay Spice 88m BID.  Medications: Current Outpatient Medications on File Prior to Visit  Medication Sig Dispense Refill  . ACCU-CHEK SOFTCLIX LANCETS lancets USE TO CHECK BLOOD SUGAR FOUR TIMES DAILY 100 each 0  . acetaminophen (TYLENOL) 500 MG tablet Take 1 tablet (500 mg total) by mouth every 6 (six) hours as needed for moderate pain. 30 tablet 3  . aspirin EC 81 MG tablet Take 81 mg by mouth daily.    . blood glucose meter kit and supplies Dispense based on patient and insurance preference. Use up to four times daily as directed. (FOR ICD-9 250.00, 250.01). 1 each 0  . dexamethasone (DECADRON) 4 MG tablet Take 2 tablets (8 mg total) by mouth 2 (two) times daily with a meal. 56 tablet 0  . famotidine (PEPCID) 20 MG tablet Take 1 tablet (20 mg total) by mouth 2 (two) times daily. 30 tablet 0  .  fluticasone (FLONASE) 50 MCG/ACT nasal spray Place 2 sprays into both nostrils daily. 15.8 g 2  . glucose blood (ACCU-CHEK AVIVA PLUS) test strip USE TO TEST BLOOD SUGAR FOUR TIMES DAILY Diagnosis code:E11.319 360 each 12  . insulin aspart (NOVOLOG FLEXPEN) 100 UNIT/ML FlexPen Inject 3-5 Units into the skin 3 (three) times daily with meals. 45 mL 3  . insulin glargine (LANTUS) 100 unit/mL SOPN Inject 0.17 mLs (17 Units total) into the skin at bedtime. 45 mL 3  . Insulin Pen Needle 31G X 5 MM MISC Use for injections under the skin as directed. Use 4 times daily. diag code E11.319 insulin dependent 400 each 10  . lisinopril (PRINIVIL,ZESTRIL) 40 MG tablet TK 1 T PO  D  1  . loratadine (CLARITIN) 10 MG tablet Take 1 tablet (10 mg total) by mouth daily. 30 tablet 2  . metFORMIN (GLUCOPHAGE) 1000 MG tablet Take 1 tablet (1,000 mg total) by mouth 2 (two) times daily with a meal. 180 tablet 2  . montelukast (SINGULAIR) 10 MG tablet Take 1 tablet (10 mg total) by mouth at bedtime. 30 tablet 2  . ondansetron (ZOFRAN ODT) 4 MG disintegrating tablet Take 1 tablet (4 mg total) by mouth every 8 (eight) hours as needed for nausea or vomiting. 20 tablet 0  . potassium chloride SA (K-DUR,KLOR-CON) 20 MEQ tablet TAKE 1 TABLET BY MOUTH DAILY 30 tablet 0  . emollient (BIAFINE) cream Apply 1 application topically daily.    .Marland KitchenLORazepam (ATIVAN) 0.5 MG  tablet 1 tablet po 30 minutes prior to radiation or MRI (Patient not taking: Reported on 06/05/2017) 30 tablet 0  . Nutritional Supplements (FEEDING SUPPLEMENT, BOOST BREEZE,) LIQD Take 1 Bottle by mouth 3 (three) times daily.   0   Current Facility-Administered Medications on File Prior to Visit  Medication Dose Route Frequency Provider Last Rate Last Dose  . 0.9 %  sodium chloride infusion   Intravenous Continuous Ladell Pier, MD   Stopped at 07/27/16 1630    Allergies: No Known Allergies Past Medical History:  Past Medical History:  Diagnosis Date  . Candida,  oral 06/22/2016   Due to steroids 06/22/16  . Diabetes mellitus   . DUB (dysfunctional uterine bleeding)    total abdominal hysterectomy in 2007, ovaries intact  . Furuncle of multiple sites    multiple I and D's  . GERD (gastroesophageal reflux disease)   . Hydradenitis   . Intellectual disability   . 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 ABDOMINAL HYSTERECTOMY    . HYDRADENITIS EXCISION    . TONSILLECTOMY AND ADENOIDECTOMY     in early childhood   Social History:  Social History   Socioeconomic History  . Marital status: Single    Spouse name: Not on file  . Number of children: Not on file  . Years of education: Not on file  . Highest education level: Not on file  Occupational History  . Not on file  Social Needs  . Financial resource strain: Not on file  . Food insecurity:    Worry: Not on file    Inability: Not on file  . Transportation needs:    Medical: Not on file    Non-medical: Not on file  Tobacco Use  . Smoking status: Never Smoker  . Smokeless tobacco: Never Used  Substance and Sexual Activity  . Alcohol use: No    Alcohol/week: 0.0 oz  . Drug use: No  . Sexual activity: Not Currently    Birth control/protection: Surgical  Lifestyle  . Physical activity:    Days per week: Not on file    Minutes per session: Not on file  . Stress: Not on file  Relationships  . Social connections:    Talks on phone: Not on file    Gets together: Not on file    Attends religious service: Not on file    Active member of club or organization: Not on file    Attends meetings of clubs or organizations: Not on file    Relationship status: Not on file  . Intimate partner violence:    Fear of current or ex partner: Not on file    Emotionally abused: Not on file    Physically abused: Not on file    Forced sexual activity: Not on file  Other Topics Concern  . 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   No kids.   No sexual activity   Lives with her mother   Family History:  Family History  Problem Relation Age of Onset  . Cancer Mother 49       Breast cancer  . Hypertension Mother   . Depression Mother   . Sickle cell trait Mother   . Cancer Father 1       lung cancer, was  a long term smoker    Review of Systems: Constitutional: Denies fevers, chills or abnormal weight loss Eyes: Denies blurriness of vision Ears, nose, mouth, throat, and face: Denies mucositis or sore throat Respiratory: Denies cough, dyspnea or wheezes Cardiovascular: Denies palpitation, chest discomfort or lower extremity swelling Gastrointestinal:  Denies nausea, constipation, diarrhea GU: Denies dysuria or incontinence Skin: Denies abnormal skin rashes Neurological: Per HPI Musculoskeletal: Denies joint pain, back or neck discomfort. No decrease in ROM Behavioral/Psych: Denies anxiety, disturbance in thought content, and mood instability  Physical Exam: Vitals:   06/05/17 1113  BP: 116/87  Pulse: (!) 125  Resp: 18  Temp: 98.8 F (37.1 C)  SpO2: 96%   KPS: 70. General: Alert, cooperative, pleasant, in no acute distress Head: Craniotomy scar noted, dry and intact. EENT: No conjunctival injection or scleral icterus. Oral mucosa moist Lungs: Resp effort normal Cardiac: Regular rate and rhythm Abdomen: Soft, non-distended abdomen Skin: No rashes cyanosis or petechiae. Extremities: No clubbing or edema  Neurologic Exam: Mental Status: Awake, alert, attentive to examiner. Oriented to self and environment. Language is fluent with intact comprehension to simple or one step lateralized commands.  Noted spatial neglect of right environment and body.    Cranial Nerves: Visual acuity is grossly normal. 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  Extra-ocular movements intact. No ptosis. Face is symmetric, tongue midline. Motor: Tone and bulk are normal. Power is full in both arms and legs. Reflexes are symmetric, no pathologic reflexes present. Intact finger to nose bilaterally Sensory: Intact to light touch and temperature Gait: Difficulty with independent gait, falling to the right.     Labs: I have reviewed the data as listed    Component Value Date/Time   NA 139 08/29/2016 1019   K 3.2 (L) 08/29/2016 1019   CL 106 08/26/2016 0426   CO2 26 08/29/2016 1019   GLUCOSE 186 (H) 08/29/2016 1019   BUN 15.5 08/29/2016 1019   CREATININE 0.52 06/01/2017 1319   CREATININE 0.7 08/29/2016 1019   CALCIUM 9.7 08/29/2016 1019   PROT 6.6 08/29/2016 1019   ALBUMIN 3.3 (L) 08/29/2016 1019   AST 23 08/29/2016 1019   ALT 54 08/29/2016 1019   ALKPHOS 87 08/29/2016 1019   BILITOT 0.71 08/29/2016 1019   GFRNONAA >60 06/01/2017 1319   GFRNONAA >89 07/07/2013 1529   GFRAA >60 06/01/2017 1319   GFRAA >89 07/07/2013 1529   Lab Results  Component Value Date   WBC 6.0 09/13/2016   NEUTROABS 4.1 09/13/2016   HGB 11.4 (L) 09/13/2016   HCT 34.1 (L) 09/13/2016   MCV 93.6 09/13/2016   PLT 262 09/13/2016    Imaging: [facility] Clinician Interpretation: I have personally reviewed the CNS images as listed.  My interpretation, in the context of the patient's clinical presentation, is progressive disease  Mr Jeri Cos Wo Contrast  Result Date: 06/01/2017 CLINICAL DATA:  Glioblastoma multiform left temporal lobe EXAM: MRI HEAD WITHOUT AND WITH CONTRAST TECHNIQUE: Multiplanar, multiecho pulse sequences of the brain and surrounding structures were obtained without and with intravenous contrast. CONTRAST:  21m MULTIHANCE GADOBENATE DIMEGLUMINE 529 MG/ML IV SOLN COMPARISON:  MRI 04/09/2017, 08/11/2016 FINDINGS: Brain: Marked progression of enhancing tumor in the left medial temporal lobe. Lobular masslike area of enhancement now measures approximately 5.7 x 3.8  x 4.3 cm. This projects into the left lateral ventricle. This component of the mass shows progressive susceptibility suggesting hemorrhage which has developed since the prior study. Significant progression of white matter hyperintensity throughout  the left temporal lobe with extension into the left occipital parietal lobe. Nodular masslike enhancement in the perimesencephalic cistern anteriorly and on the right has progressed compatible with CSF spread of tumor. Small amount of enhancement in the floor of the fourth ventricle may also represent tumor. Progressive hydrocephalus. Progressive midline shift now approximately 7 mm. Progressive periventricular white matter hyperintensity. Some of this is likely due to transependymal resorption of CSF due to hydrocephalus. Negative for acute infarct. Vascular: Normal arterial flow void Skull and upper cervical spine: Negative Sinuses/Orbits: Negative Other: None IMPRESSION: Rapid progression of tumor since the prior study. The main component of the tumor in the left temporal lobe shows significant progression in size in interval hemorrhage. Extensive white matter hyperintensity throughout the left temporal lobe extending into the left occipital parietal lobes also has progressed. Increased mass-effect and 7 mm midline shift to the right. 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 Progressive hydrocephalus These results will be called to the ordering clinician or representative by the Radiologist Assistant, and communication documented in the PACS or zVision Dashboard. Electronically Signed   By: Franchot Gallo M.D.   On: 06/01/2017 17:23    Pathology:  Assessment/Plan 1. Glioblastoma multiforme of temporal lobe [facility])  Jaime [name] is clinically and radiographically progressive today.  We extensively discussed treatment options at bedside with Jaime [name] and her family.  Because of previous cytopenias, she is  unlikely to tolerate or benefit from salvage cytotoxic chemotherapy such as CCNU or carboplatin.  After extensive discussion of risks and benefits, Jaime [name] would like to initiate therapy with single agent Bevacizumab, 30m/kg IV q2 weeks.  Urine protein and CBC/CMP will be checked prior to each infusion.  We discussed potential side effects including hypertension, clotting/bleeding events, and impaired wound healing.  Therapy should be held for the following:  ANC less than 500  Platelets less than 50,000  LFT or creatinine greater than 2x ULN  If clinical concerns/contraindications develop  Screening for potential clinical trials was performed and discussed using eligibility criteria for [facility], as well as national database available on [email or web address]    The patient is not a candidate for a research protocol at this time due to poor functional status.   Because of events described in history consistent with likely seizures, we recommended increasing Keppra to [id number].  We asked family to keep a seizure diary for better characterization of frequency.  Decadron can be continued for now at 56m40mID given degree of progression and symptom burden.  We spent ten additional minutes teaching regarding the natural history, biology, and historical experience in the treatment of brain tumors. We then discussed in detail the current recommendations for therapy focusing on the mode of administration, mechanism of action, anticipated toxicities, and quality of life issues associated with this plan. We also provided teaching sheets for the patient to take home as an additional resource.  We appreciate the opportunity to participate in the care of Jaime [name] should return to clinic prior to first infusion.  All questions were answered. The patient knows to call the clinic with any problems, questions or concerns. No barriers to  learning were detected.  The total time spent in the encounter was 40 minutes and more than 50% was on counseling and review of test results   ZacVentura SellersD Medical Director of Neuro-Oncology [facility] [place]/07/19  3:45 PM

## 2017-06-07 ENCOUNTER — Other Ambulatory Visit: Payer: Self-pay | Admitting: *Deleted

## 2017-06-07 DIAGNOSIS — C712 Malignant neoplasm of temporal lobe: Secondary | ICD-10-CM

## 2017-06-10 ENCOUNTER — Emergency Department (HOSPITAL_COMMUNITY): Payer: Medicaid Other

## 2017-06-10 ENCOUNTER — Encounter (HOSPITAL_COMMUNITY): Payer: Self-pay | Admitting: Internal Medicine

## 2017-06-10 ENCOUNTER — Other Ambulatory Visit: Payer: Self-pay

## 2017-06-10 ENCOUNTER — Inpatient Hospital Stay (HOSPITAL_COMMUNITY): Payer: Medicaid Other

## 2017-06-10 ENCOUNTER — Inpatient Hospital Stay (HOSPITAL_COMMUNITY)
Admission: EM | Admit: 2017-06-10 | Discharge: 2017-06-23 | DRG: 054 | Disposition: A | Discharge status: Skilled Nursing Facility | Payer: Medicaid Other | Attending: Family Medicine | Admitting: Family Medicine

## 2017-06-10 DIAGNOSIS — T380X5A Adverse effect of glucocorticoids and synthetic analogues, initial encounter: Secondary | ICD-10-CM | POA: Diagnosis present

## 2017-06-10 DIAGNOSIS — Z90710 Acquired absence of both cervix and uterus: Secondary | ICD-10-CM

## 2017-06-10 DIAGNOSIS — Z751 Person awaiting admission to adequate facility elsewhere: Secondary | ICD-10-CM | POA: Diagnosis not present

## 2017-06-10 DIAGNOSIS — Z9089 Acquired absence of other organs: Secondary | ICD-10-CM

## 2017-06-10 DIAGNOSIS — Z801 Family history of malignant neoplasm of trachea, bronchus and lung: Secondary | ICD-10-CM | POA: Diagnosis not present

## 2017-06-10 DIAGNOSIS — Z8249 Family history of ischemic heart disease and other diseases of the circulatory system: Secondary | ICD-10-CM

## 2017-06-10 DIAGNOSIS — R296 Repeated falls: Secondary | ICD-10-CM | POA: Diagnosis present

## 2017-06-10 DIAGNOSIS — K219 Gastro-esophageal reflux disease without esophagitis: Secondary | ICD-10-CM | POA: Diagnosis present

## 2017-06-10 DIAGNOSIS — Z923 Personal history of irradiation: Secondary | ICD-10-CM

## 2017-06-10 DIAGNOSIS — F79 Unspecified intellectual disabilities: Secondary | ICD-10-CM | POA: Diagnosis present

## 2017-06-10 DIAGNOSIS — G936 Cerebral edema: Secondary | ICD-10-CM | POA: Diagnosis present

## 2017-06-10 DIAGNOSIS — Z794 Long term (current) use of insulin: Secondary | ICD-10-CM | POA: Diagnosis not present

## 2017-06-10 DIAGNOSIS — C712 Malignant neoplasm of temporal lobe: Secondary | ICD-10-CM | POA: Diagnosis present

## 2017-06-10 DIAGNOSIS — I10 Essential (primary) hypertension: Secondary | ICD-10-CM | POA: Diagnosis present

## 2017-06-10 DIAGNOSIS — Z803 Family history of malignant neoplasm of breast: Secondary | ICD-10-CM | POA: Diagnosis not present

## 2017-06-10 DIAGNOSIS — Z832 Family history of diseases of the blood and blood-forming organs and certain disorders involving the immune mechanism: Secondary | ICD-10-CM | POA: Diagnosis not present

## 2017-06-10 DIAGNOSIS — Z818 Family history of other mental and behavioral disorders: Secondary | ICD-10-CM | POA: Diagnosis not present

## 2017-06-10 DIAGNOSIS — Z7982 Long term (current) use of aspirin: Secondary | ICD-10-CM

## 2017-06-10 DIAGNOSIS — R4182 Altered mental status, unspecified: Secondary | ICD-10-CM

## 2017-06-10 DIAGNOSIS — Z66 Do not resuscitate: Secondary | ICD-10-CM | POA: Diagnosis present

## 2017-06-10 DIAGNOSIS — R569 Unspecified convulsions: Secondary | ICD-10-CM

## 2017-06-10 DIAGNOSIS — G40909 Epilepsy, unspecified, not intractable, without status epilepticus: Secondary | ICD-10-CM | POA: Diagnosis present

## 2017-06-10 DIAGNOSIS — E09319 Drug or chemical induced diabetes mellitus with unspecified diabetic retinopathy without macular edema: Secondary | ICD-10-CM | POA: Diagnosis present

## 2017-06-10 DIAGNOSIS — G919 Hydrocephalus, unspecified: Secondary | ICD-10-CM | POA: Diagnosis present

## 2017-06-10 DIAGNOSIS — H547 Unspecified visual loss: Secondary | ICD-10-CM | POA: Diagnosis present

## 2017-06-10 DIAGNOSIS — E11319 Type 2 diabetes mellitus with unspecified diabetic retinopathy without macular edema: Secondary | ICD-10-CM | POA: Diagnosis present

## 2017-06-10 DIAGNOSIS — Z515 Encounter for palliative care: Secondary | ICD-10-CM | POA: Diagnosis present

## 2017-06-10 DIAGNOSIS — J189 Pneumonia, unspecified organism: Secondary | ICD-10-CM

## 2017-06-10 DIAGNOSIS — G9389 Other specified disorders of brain: Secondary | ICD-10-CM | POA: Diagnosis present

## 2017-06-10 DIAGNOSIS — R000 Tachycardia, unspecified: Secondary | ICD-10-CM | POA: Diagnosis present

## 2017-06-10 DIAGNOSIS — G932 Benign intracranial hypertension: Secondary | ICD-10-CM | POA: Diagnosis present

## 2017-06-10 DIAGNOSIS — C719 Malignant neoplasm of brain, unspecified: Secondary | ICD-10-CM

## 2017-06-10 DIAGNOSIS — G939 Disorder of brain, unspecified: Secondary | ICD-10-CM | POA: Diagnosis not present

## 2017-06-10 DIAGNOSIS — J181 Lobar pneumonia, unspecified organism: Secondary | ICD-10-CM | POA: Diagnosis not present

## 2017-06-10 DIAGNOSIS — G9341 Metabolic encephalopathy: Secondary | ICD-10-CM

## 2017-06-10 DIAGNOSIS — L8990 Pressure ulcer of unspecified site, unspecified stage: Secondary | ICD-10-CM

## 2017-06-10 LAB — CBC WITH DIFFERENTIAL/PLATELET
Basophils Absolute: 0 10*3/uL (ref 0.0–0.1)
Basophils Relative: 0 %
Eosinophils Absolute: 0 10*3/uL (ref 0.0–0.7)
Eosinophils Relative: 0 %
HEMATOCRIT: 39.6 % (ref 36.0–46.0)
Hemoglobin: 13.2 g/dL (ref 12.0–15.0)
LYMPHS PCT: 4 %
Lymphs Abs: 0.6 10*3/uL — ABNORMAL LOW (ref 0.7–4.0)
MCH: 29.6 pg (ref 26.0–34.0)
MCHC: 33.3 g/dL (ref 30.0–36.0)
MCV: 88.8 fL (ref 78.0–100.0)
MONO ABS: 0.3 10*3/uL (ref 0.1–1.0)
MONOS PCT: 2 %
NEUTROS ABS: 14.5 10*3/uL — AB (ref 1.7–7.7)
Neutrophils Relative %: 94 %
Platelets: 257 10*3/uL (ref 150–400)
RBC: 4.46 MIL/uL (ref 3.87–5.11)
RDW: 14.1 % (ref 11.5–15.5)
WBC: 15.5 10*3/uL — ABNORMAL HIGH (ref 4.0–10.5)

## 2017-06-10 LAB — COMPREHENSIVE METABOLIC PANEL
ALK PHOS: 39 U/L (ref 38–126)
ALT: 16 U/L (ref 14–54)
AST: 32 U/L (ref 15–41)
Albumin: 3.5 g/dL (ref 3.5–5.0)
Anion gap: 11 (ref 5–15)
BUN: 11 mg/dL (ref 6–20)
CALCIUM: 9.4 mg/dL (ref 8.9–10.3)
CO2: 27 mmol/L (ref 22–32)
Chloride: 101 mmol/L (ref 101–111)
Creatinine, Ser: 0.58 mg/dL (ref 0.44–1.00)
GFR calc Af Amer: >60 mL/min (ref >60)
Glucose, Bld: 175 mg/dL — ABNORMAL HIGH (ref 65–99)
POTASSIUM: 4.7 mmol/L (ref 3.5–5.1)
Sodium: 139 mmol/L (ref 135–145)
Total Bilirubin: 1.3 mg/dL — ABNORMAL HIGH (ref 0.3–1.2)
Total Protein: 6.3 g/dL — ABNORMAL LOW (ref 6.5–8.1)

## 2017-06-10 LAB — CBG MONITORING, ED
Glucose-Capillary: 193 mg/dL — ABNORMAL HIGH (ref 65–99)
Glucose-Capillary: 222 mg/dL — ABNORMAL HIGH (ref 65–99)

## 2017-06-10 LAB — I-STAT CG4 LACTIC ACID, ED: LACTIC ACID, VENOUS: 1.48 mmol/L (ref 0.5–1.9)

## 2017-06-10 MED ORDER — DEXAMETHASONE SODIUM PHOSPHATE 10 MG/ML IJ SOLN
4.0000 mg | Freq: Four times a day (QID) | INTRAMUSCULAR | Status: DC
  Administered 2017-06-10 – 2017-06-23 (×49): 4 mg via INTRAVENOUS
  Filled 2017-06-10 (×49): qty 1

## 2017-06-10 MED ORDER — SODIUM CHLORIDE 0.9 % IV BOLUS
1000.0000 mL | Freq: Once | INTRAVENOUS | Status: AC
  Administered 2017-06-10: 1000 mL via INTRAVENOUS

## 2017-06-10 MED ORDER — FLUTICASONE PROPIONATE 50 MCG/ACT NA SUSP
2.0000 | Freq: Every day | NASAL | Status: DC
  Administered 2017-06-11 – 2017-06-23 (×13): 2 via NASAL
  Filled 2017-06-10: qty 16

## 2017-06-10 MED ORDER — INSULIN GLARGINE 100 UNIT/ML ~~LOC~~ SOLN
5.0000 [IU] | Freq: Every day | SUBCUTANEOUS | Status: DC
  Filled 2017-06-10 (×2): qty 0.05

## 2017-06-10 MED ORDER — ONDANSETRON HCL 4 MG/2ML IJ SOLN: 4.0000 mg | Freq: Four times a day (QID) | INTRAMUSCULAR | Status: DC | PRN

## 2017-06-10 MED ORDER — LEVETIRACETAM IN NACL 1500 MG/100ML IV SOLN
1500.0000 mg | Freq: Two times a day (BID) | INTRAVENOUS | Status: DC
  Administered 2017-06-10 – 2017-06-14 (×8): 1500 mg via INTRAVENOUS
  Filled 2017-06-10 (×9): qty 100

## 2017-06-10 MED ORDER — IOPAMIDOL (ISOVUE-370) INJECTION 76%
100.0000 mL | Freq: Once | INTRAVENOUS | Status: AC | PRN
  Administered 2017-06-10: 100 mL via INTRAVENOUS

## 2017-06-10 MED ORDER — ENOXAPARIN SODIUM 40 MG/0.4ML ~~LOC~~ SOLN
40.0000 mg | Freq: Every day | SUBCUTANEOUS | Status: DC
  Administered 2017-06-11 – 2017-06-23 (×13): 40 mg via SUBCUTANEOUS
  Filled 2017-06-10 (×13): qty 0.4

## 2017-06-10 MED ORDER — ONDANSETRON HCL 4 MG PO TABS
4.0000 mg | ORAL_TABLET | Freq: Four times a day (QID) | ORAL | Status: DC | PRN
Start: 2017-06-10 — End: 2017-06-23

## 2017-06-10 MED ORDER — INSULIN ASPART 100 UNIT/ML ~~LOC~~ SOLN
0.0000 [IU] | Freq: Three times a day (TID) | SUBCUTANEOUS | Status: DC
  Administered 2017-06-11: 2 [IU] via SUBCUTANEOUS
  Administered 2017-06-11: 3 [IU] via SUBCUTANEOUS
  Administered 2017-06-11: 2 [IU] via SUBCUTANEOUS
  Administered 2017-06-12: 3 [IU] via SUBCUTANEOUS

## 2017-06-10 MED ORDER — ACETAMINOPHEN 325 MG PO TABS
650.0000 mg | ORAL_TABLET | Freq: Four times a day (QID) | ORAL | Status: DC | PRN
  Administered 2017-06-12: 650 mg via ORAL
  Filled 2017-06-10: qty 2

## 2017-06-10 MED ORDER — SODIUM CHLORIDE 0.9 % IV SOLN
INTRAVENOUS | Status: AC
  Administered 2017-06-10 – 2017-06-11 (×2): via INTRAVENOUS

## 2017-06-10 MED ORDER — ASPIRIN EC 81 MG PO TBEC
81.0000 mg | DELAYED_RELEASE_TABLET | Freq: Every day | ORAL | Status: DC
  Administered 2017-06-11 – 2017-06-20 (×10): 81 mg via ORAL
  Filled 2017-06-10 (×10): qty 1

## 2017-06-10 MED ORDER — ACETAMINOPHEN 650 MG RE SUPP: 650.0000 mg | Freq: Four times a day (QID) | RECTAL | Status: DC | PRN

## 2017-06-10 MED ORDER — LORAZEPAM 2 MG/ML IJ SOLN: 2.0000 mg | Freq: Once | INTRAMUSCULAR | Status: DC

## 2017-06-10 MED ORDER — HYDRALAZINE HCL 20 MG/ML IJ SOLN: 10.0000 mg | INTRAMUSCULAR | Status: DC | PRN

## 2017-06-10 NOTE — ED Triage Notes (Signed)
Patient came from home via EMS. Felt hip and arm pain today and some sl;ight pain upon urination. Patient HR up to 160 on scene. Patient followed at [facility] for hx: Brain Tumor.

## 2017-06-10 NOTE — H&P (Signed)
History and Physical    Jaime [name] ZOX:[id number] DOB: [birth year] DOA: 06/10/2017  PCP: Katherine Roan, MD  Patient coming from: Home.  Chief Complaint: Increasing confusion.  HPI: Jaime [name] is a 52 y.o. female with history of glioblastoma multiforme who was recently seen Dr. Mickeal Skinner, neuro oncologist as patient condition was further worsening with patient having frequent falls visual impairment.  Patient during her last visit with oncologist Dr. Mickeal Skinner was decided to start palliative chemotherapy next week but was brought to the ER after patient was found to have further decline with confusion at times having frequent leg movements typical of the seizures.  Patient's Decadron and Keppra doses were recently increased.  Patient did not have any nausea vomiting diarrhea chest pain.  Was found to be very tachycardic.  ED Course: In the ER patient is oriented to her name and place.  Follows commands and moves extremities.  CT of the head shows left temporal glioblastoma with changes comparable to the recent MRI brain.  ER physician discussed with on-call oncologist who advised patient to be transferred to [facility] long as patient may need further treatment for for her glioblastoma.  On my exam patient is following commands and does not have any seizure-like activities.    Review of Systems: As per HPI, rest all negative.   Past Medical History:  Diagnosis Date  . Candida, oral 06/22/2016   Due to steroids 06/22/16  . Diabetes mellitus   . DUB (dysfunctional uterine bleeding)    total abdominal hysterectomy in 2007, ovaries intact  . Furuncle of multiple sites    multiple I and D's  . GERD (gastroesophageal reflux disease)   . Hydradenitis   . Intellectual disability   . 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 ABDOMINAL HYSTERECTOMY    . HYDRADENITIS EXCISION    . TONSILLECTOMY AND ADENOIDECTOMY     in early  childhood     reports that she has never smoked. She has never used smokeless tobacco. She reports that she does not drink alcohol or use drugs.  No Known Allergies  Family History  Problem Relation Age of Onset  . Cancer Mother 59       Breast cancer  . Hypertension Mother   . Depression Mother   . Sickle cell trait Mother   . Cancer Father 49       lung cancer, was a long term smoker    Prior to Admission medications   Medication Sig Start Date End Date Taking? Authorizing Provider  acetaminophen (TYLENOL) 500 MG tablet Take 1 tablet (500 mg total) by mouth every 6 (six) hours as needed for moderate pain. 02/26/17  Yes Hoffman, Jessica Ratliff, DO  aspirin EC 81 MG tablet Take 81 mg by mouth daily.   Yes [provider]  dexamethasone (DECADRON) 4 MG tablet Take 2 tablets (8 mg total) by mouth 2 (two) times daily with a meal. 05/28/17  Yes Ladell Pier, MD  famotidine (PEPCID) 20 MG tablet Take 1 tablet (20 mg total) by mouth 2 (two) times daily. 07/03/16  Yes Minus Liberty, MD  fluticasone [facility]) 50 MCG/ACT nasal spray Place 2 sprays into both nostrils daily. 06/08/16  Yes Barnet Glasgow, NP  insulin aspart (NOVOLOG FLEXPEN) 100 UNIT/ML FlexPen Inject 3-5 Units into the skin 3 (three) times daily with meals. 04/09/17 10/06/17 Yes Katherine Roan, MD  insulin glargine (LANTUS) 100 unit/mL SOPN Inject  0.17 mLs (17 Units total) into the skin at bedtime. 04/09/17  Yes Katherine Roan, MD  levETIRAcetam (KEPPRA) 500 MG tablet Take 3 tablets (1,500 mg total) by mouth 2 (two) times daily. Patient taking differently: Take 1,500 mg by mouth 3 (three) times daily.  06/05/17  Yes Vaslow, Acey Lav, MD  lisinopril (PRINIVIL,ZESTRIL) 40 MG tablet Take 40 mg by mouth daily.   Yes [provider]  loratadine (CLARITIN) 10 MG tablet Take 1 tablet (10 mg total) by mouth daily. 07/03/16  Yes Minus Liberty, MD  metFORMIN (GLUCOPHAGE) 1000 MG tablet Take 1 tablet (1,000  mg total) by mouth 2 (two) times daily with a meal. 02/20/17 11/17/17 Yes Winfrey, Jenne Pane, MD  montelukast (SINGULAIR) 10 MG tablet Take 1 tablet (10 mg total) by mouth at bedtime. 01/10/17  Yes Ladell Pier, MD  ondansetron (ZOFRAN ODT) 4 MG disintegrating tablet Take 1 tablet (4 mg total) by mouth every 8 (eight) hours as needed for nausea or vomiting. 07/24/16  Yes Ladell Pier, MD  potassium chloride SA (K-DUR,KLOR-CON) 20 MEQ tablet TAKE 1 TABLET BY MOUTH DAILY 05/10/17  Yes Ladell Pier, MD  ACCU-CHEK SOFTCLIX LANCETS lancets USE TO CHECK BLOOD SUGAR FOUR TIMES DAILY 07/11/16   Minus Liberty, MD  blood glucose meter kit and supplies Dispense based on patient and insurance preference. Use up to four times daily as directed. (FOR ICD-9 250.00, 250.01). 06/21/16   Holley Raring, MD  glucose blood (ACCU-CHEK AVIVA PLUS) test strip USE TO TEST BLOOD SUGAR FOUR TIMES DAILY Diagnosis code:E11.319 02/13/17   Katherine Roan, MD  Insulin Pen Needle 31G X 5 MM MISC Use for injections under the skin as directed. Use 4 times daily. diag code E11.319 insulin dependent 02/13/17   Katherine Roan, MD  LORazepam (ATIVAN) 0.5 MG tablet 1 tablet po 30 minutes prior to radiation or MRI Patient not taking: Reported on 06/05/2017 06/21/16   Hayden Pedro, PA-C  Nutritional Supplements (FEEDING SUPPLEMENT, BOOST BREEZE,) LIQD Take 1 Bottle by mouth 3 (three) times daily.  08/17/16   [provider]    Physical Exam: Vitals:   06/10/17 2030 06/10/17 2041 06/10/17 2045 06/10/17 2150  BP:  120/89  126/84  Pulse:  100 86 92  Resp: _0 Temp:  98.2 F (36.8 C)    TempSrc:  Oral    SpO2:  97% 97% 99%  Weight:      Height:          Constitutional: Moderately built and nourished. Vitals:   06/10/17 2030 06/10/17 2041 06/10/17 2045 06/10/17 2150  BP:  120/89  126/84  Pulse:  100 86 92  Resp: _1 Temp:  98.2 F (36.8 C)    TempSrc:  Oral    SpO2:  97%  97% 99%  Weight:      Height:       Eyes: Anicteric no pallor. ENMT: No discharge from the ears eyes nose, mouth. Neck: No mass felt.  No neck rigidity. Respiratory: No rhonchi or crepitations. Cardiovascular: S1-S2 heard no murmurs appreciated. Abdomen: Soft nontender bowel sounds present. Musculoskeletal: No edema.  No joint effusion. Skin: No rash. Neurologic: Patient is mildly drowsy but oriented to name and place.  Moves all extremities. Psychiatric: Mildly drowsy but oriented to name and place.   Labs on Admission: I have personally reviewed following labs and imaging studies  CBC: Recent Labs  Lab 06/10/17 1612  WBC  15.5*  NEUTROABS 14.5*  HGB 13.2  HCT 39.6  MCV 88.8  PLT 003   Basic Metabolic Panel: Recent Labs  Lab 06/10/17 1612  NA 139  K 4.7  CL 101  CO2 27  GLUCOSE 175*  BUN 11  CREATININE 0.58  CALCIUM 9.4   GFR: Estimated Creatinine Clearance: 74 mL/min (by C-G formula based on SCr of 0.58 mg/dL). Liver Function Tests: Recent Labs  Lab 06/10/17 1612  AST 32  ALT 16  ALKPHOS 39  BILITOT 1.3*  PROT 6.3*  ALBUMIN 3.5   No results for input(s): LIPASE, AMYLASE in the last 168 hours. No results for input(s): AMMONIA in the last 168 hours. Coagulation Profile: No results for input(s): INR, PROTIME in the last 168 hours. Cardiac Enzymes: No results for input(s): CKTOTAL, CKMB, CKMBINDEX, TROPONINI in the last 168 hours. BNP (last 3 results) No results for input(s): PROBNP in the last 8760 hours. HbA1C: No results for input(s): HGBA1C in the last 72 hours. CBG: Recent Labs  Lab 06/10/17 1636 06/10/17 1719  GLUCAP 193* 222*   Lipid Profile: No results for input(s): CHOL, HDL, LDLCALC, TRIG, CHOLHDL, LDLDIRECT in the last 72 hours. Thyroid Function Tests: No results for input(s): TSH, T4TOTAL, FREET4, T3FREE, THYROIDAB in the last 72 hours. Anemia Panel: No results for input(s): VITAMINB12, FOLATE, FERRITIN, TIBC, IRON, RETICCTPCT in  the last 72 hours. Urine analysis:    Component Value Date/Time   COLORURINE YELLOW 08/20/2016 0827   APPEARANCEUR HAZY (A) 08/20/2016 0827   LABSPEC 1.010 08/20/2016 0827   LABSPEC 1.010 08/09/2016 1502   PHURINE 5.0 08/20/2016 0827   GLUCOSEU 50 (A) 08/20/2016 0827   GLUCOSEU Negative 08/09/2016 1502   HGBUR NEGATIVE 08/20/2016 0827   BILIRUBINUR NEGATIVE 08/20/2016 0827   BILIRUBINUR Negative 08/09/2016 1502   KETONESUR NEGATIVE 08/20/2016 0827   PROTEINUR NEGATIVE 08/20/2016 0827   UROBILINOGEN 0.2 08/09/2016 1502   NITRITE NEGATIVE 08/20/2016 0827   LEUKOCYTESUR MODERATE (A) 08/20/2016 0827   LEUKOCYTESUR Negative 08/09/2016 1502   Sepsis Labs: _0 (procalcitonin:4,lacticidven:4) )No results found for this or any previous visit (from the past 240 hour(s)).   Radiological Exams on Admission: Ct Head Wo Contrast  Result Date: 06/10/2017 CLINICAL DATA:  Altered mental status. 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 EXAM: CT HEAD WITHOUT CONTRAST TECHNIQUE: Contiguous axial images were obtained from the base of the skull through the vertex without intravenous contrast. COMPARISON:  Brain MRI 06/01/2017.  Head CT scan 06/18/2016. FINDINGS: Brain: Again seen is a large mass lesion in the left temporal lobe with extensive surrounding vasogenic edema. Discrete measurement of this lesion is difficult on uninfused CT scan but it is not grossly changed since the most recent study. There is associated left-to-right midline shift of approximately 0.7 cm, unchanged. Hydrocephalus also appears unchanged. No hemorrhage is identified. Vascular: No hyperdense vessel or unexpected calcification. Skull: Intact. Sinuses/Orbits: Negative. Other: None. IMPRESSION: No marked change in the patient's left temporal lobe glioblastoma multiforme with associated extensive vasogenic edema, left to right midline shift and hydrocephalus since the patient's brain MRI 06/01/2017.  Electronically Signed   By: Inge Rise M.D.   On: 06/10/2017 17:08    EKG: Independently reviewed.  Sinus tachycardia.  Assessment/Plan Active Problems:   Diabetes mellitus type 2 with retinopathy ([place])   Essential hypertension   Glioblastoma multiforme of temporal lobe ([facility])   Brain mass   Seizures ([place])    1. Acute encephalopathy secondary to glioblastoma multiforme progression -oncologist to see  patient for further recommendation.  Patient has been continued on Decadron 4 mg IV every 6 for now.  Please notify Dr. Mickeal Skinner in a.m. 2. History of seizures -was recently placed on increased dose of Keppra.  Patient's mother has noticed increased left lower extremity seizure-like activities off and on since that Keppra dose has been increased.  I discussed with Dr. Lorraine Lax on-call neurologist who advised to continue [place] and if there is any further episodes during hospitalization will need to load patient on Dilantin. 3. Diabetes mellitus type 2 -usually takes 17 units Lantus at bedtime.  I have decreased the Lantus dose to 5 units and will dose it in the morning with sliding scale coverage. 4. Hypertension -we will hold antihypertensive and keep patient on PRN IV hydralazine. 5. Tachycardia -CT angiogram of the chest has been ordered.   DVT prophylaxis: Lovenox. Code Status: DNR. Family Communication: Patient's mother. Disposition Plan: To be determined. Consults called: ER physician discussed with oncologist.  Admission status: Observation.   Rise Patience MD [facility] Pager [phone].  If 7PM-7AM, please contact night-coverage [email or web address] Password [facility]  06/10/2017, 10:43 PM

## 2017-06-10 NOTE — ED Notes (Signed)
Patient has removed 2 IV's. Treatment delayed until IV team places US guided IV.

## 2017-06-10 NOTE — ED Notes (Signed)
Patient transported to CT 

## 2017-06-10 NOTE — ED Provider Notes (Signed)
[facility] EMERGENCY DEPARTMENT Provider Note   CSN: [id number] Arrival date & time: 06/10/17  1544    History   Chief Complaint Chief Complaint  Patient presents with  . Tachycardia    HPI ZAHLI VETSCH is a 52 y.o. female.  HPI Patient is a 52 year old female with history of glioblastoma multiforme who is about to start palliative chemotherapy who presents from home with worsening mental status over the last several days.  She is here with her mother.  The patient has continued to have several falls.  She does not report any injuries from these falls.  However, the patient is not oriented.  The patient is alert and very conversational, however does not appear to be aware of what is going on today.  The mother was concerned because she has gotten progressively confused over the last few days.  They have recently increased her dose of seizure medication and Decadron.  The mother reports that she has been taking these.  She has not had any seizures since she was seen by the oncologist on May 7.  The patient has not had any infectious signs or symptoms such as cough, fever, chills, dysuria, or abdominal pain.  Past Medical History:  Diagnosis Date  . Candida, oral 06/22/2016   Due to steroids 06/22/16  . Diabetes mellitus   . DUB (dysfunctional uterine bleeding)    total abdominal hysterectomy in 2007, ovaries intact  . Furuncle of multiple sites    multiple I and D's  . GERD (gastroesophageal reflux disease)   . Hydradenitis   . Intellectual disability   . 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 Brain mass 06/10/2017  . Seizures ([place]) 06/10/2017  . Healthcare maintenance 02/26/2017  . Leg pain, anterior, left 01/18/2017  . Protein-calorie malnutrition, severe 08/21/2016  . Symptomatic anemia 08/20/2016  . Dehydration 08/20/2016  . Acute prerenal azotemia 08/20/2016  . SIRS (systemic  inflammatory response syndrome) ([place]) 08/20/2016  . Glioblastoma multiforme of temporal lobe ([place]) 07/11/2016  . Furuncle of labia majora 06/29/2016  . 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 Palliative care by specialist   . HLD (hyperlipidemia) 06/18/2016  . GERD (gastroesophageal reflux disease) 06/18/2016  . Brain tumor ([place])   . Nonproliferative diabetic retinopathy ([place]) 12/17/2015  . Onychomycosis of left great toe 01/13/2014  . DNR (do not resuscitate) discussion 09/23/2013  . Essential hypertension 06/11/2013  . Diabetes mellitus type 2 with retinopathy ([place]) 11/30/2009  . Obesity 09/28/2009  . SICKLE-CELL TRAIT 09/28/2009    Past Surgical History:  Procedure Laterality Date  . ABDOMINAL HYSTERECTOMY    . HYDRADENITIS EXCISION    . TONSILLECTOMY AND ADENOIDECTOMY     in early childhood     OB History   None      Home Medications    Prior to Admission medications   Medication Sig Start Date End Date Taking? Authorizing Provider  acetaminophen (TYLENOL) 500 MG tablet Take 1 tablet (500 mg total) by mouth every 6 (six) hours as needed for moderate pain. 02/26/17  Yes Hoffman, Jessica Ratliff, DO  aspirin EC 81 MG tablet Take 81 mg by mouth daily.   Yes [provider]  dexamethasone (DECADRON) 4 MG tablet Take 2 tablets (8 mg total) by mouth 2 (two) times daily with a meal. 05/28/17  Yes Ladell Pier, MD  famotidine (PEPCID) 20 MG tablet Take 1 tablet (20 mg total)  by mouth 2 (two) times daily. 07/03/16  Yes Minus Liberty, MD  fluticasone [place]) 50 MCG/ACT nasal spray Place 2 sprays into both nostrils daily. 06/08/16  Yes Barnet Glasgow, NP  insulin aspart (NOVOLOG FLEXPEN) 100 UNIT/ML FlexPen Inject 3-5 Units into the skin 3 (three) times daily with meals. 04/09/17 10/06/17 Yes Katherine Roan, MD  insulin glargine (LANTUS) 100 unit/mL SOPN Inject 0.17 mLs (17 Units total) into the skin at bedtime. 04/09/17  Yes Katherine Roan, MD    levETIRAcetam (KEPPRA) 500 MG tablet Take 3 tablets (1,500 mg total) by mouth 2 (two) times daily. Patient taking differently: Take 1,500 mg by mouth 3 (three) times daily.  06/05/17  Yes Vaslow, Acey Lav, MD  lisinopril (PRINIVIL,ZESTRIL) 40 MG tablet Take 40 mg by mouth daily.   Yes [provider]  loratadine (CLARITIN) 10 MG tablet Take 1 tablet (10 mg total) by mouth daily. 07/03/16  Yes Minus Liberty, MD  metFORMIN (GLUCOPHAGE) 1000 MG tablet Take 1 tablet (1,000 mg total) by mouth 2 (two) times daily with a meal. 02/20/17 11/17/17 Yes Winfrey, Jenne Pane, MD  montelukast (SINGULAIR) 10 MG tablet Take 1 tablet (10 mg total) by mouth at bedtime. 01/10/17  Yes Ladell Pier, MD  ondansetron (ZOFRAN ODT) 4 MG disintegrating tablet Take 1 tablet (4 mg total) by mouth every 8 (eight) hours as needed for nausea or vomiting. 07/24/16  Yes Ladell Pier, MD  potassium chloride SA (K-DUR,KLOR-CON) 20 MEQ tablet TAKE 1 TABLET BY MOUTH DAILY 05/10/17  Yes Ladell Pier, MD  ACCU-CHEK SOFTCLIX LANCETS lancets USE TO CHECK BLOOD SUGAR FOUR TIMES DAILY 07/11/16   Minus Liberty, MD  blood glucose meter kit and supplies Dispense based on patient and insurance preference. Use up to four times daily as directed. (FOR ICD-9 250.00, 250.01). 06/21/16   Holley Raring, MD  glucose blood (ACCU-CHEK AVIVA PLUS) test strip USE TO TEST BLOOD SUGAR FOUR TIMES DAILY Diagnosis code:E11.319 02/13/17   Katherine Roan, MD  Insulin Pen Needle 31G X 5 MM MISC Use for injections under the skin as directed. Use 4 times daily. diag code E11.319 insulin dependent 02/13/17   Katherine Roan, MD  LORazepam (ATIVAN) 0.5 MG tablet 1 tablet po 30 minutes prior to radiation or MRI Patient not taking: Reported on 06/05/2017 06/21/16   Hayden Pedro, PA-C  Nutritional Supplements (FEEDING SUPPLEMENT, BOOST BREEZE,) LIQD Take 1 Bottle by mouth 3 (three) times daily.  08/17/16   [provider]     Family History Family History  Problem Relation Age of Onset  . Cancer Mother 58       Breast cancer  . Hypertension Mother   . Depression Mother   . Sickle cell trait Mother   . Cancer Father 53       lung cancer, was a long term smoker    Social History Social History   Tobacco Use  . Smoking status: Never Smoker  . Smokeless tobacco: Never Used  Substance Use Topics  . Alcohol use: No    Alcohol/week: 0.0 oz  . Drug use: No     Allergies   Patient has no known allergies.   Review of Systems Review of Systems  Unable to perform ROS: Mental status change     Physical Exam Updated Vital Signs BP 126/84   Pulse 92   Temp 98.2 F (36.8 C) (Oral)   Resp 16   Ht 5' 2"  (1.575 m)   Wt  64 kg (141 lb)   LMP 10/31/2005   SpO2 99%   BMI 25.79 kg/m   Physical Exam  Constitutional: She appears well-developed and well-nourished. No distress.  HENT:  Head: Normocephalic and atraumatic.  Eyes: Conjunctivae are normal.  Neck: Neck supple.  Cardiovascular: Regular rhythm.  No murmur heard. Tachycardic  Pulmonary/Chest: Effort normal and breath sounds normal. No respiratory distress.  Abdominal: Soft. There is no tenderness.  Musculoskeletal: Normal range of motion. She exhibits no edema, tenderness or deformity.  Neurological: She is alert.  Oriented to name but not to date, situation. She has normal strength and sensation in all extremities. 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  Skin: Skin is warm and dry.  Psychiatric: She has a normal mood and affect.  Nursing note and vitals reviewed.    ED Treatments / Results  Labs (all labs ordered are listed, but only abnormal results are displayed) Labs Reviewed  CBC WITH DIFFERENTIAL/PLATELET - Abnormal; Notable for the following components:      Result Value   WBC 15.5 (*)    Neutro Abs 14.5 (*)    Lymphs Abs 0.6 (*)    All other components within normal limits  COMPREHENSIVE METABOLIC PANEL -  Abnormal; Notable for the following components:   Glucose, Bld 175 (*)    Total Protein 6.3 (*)    Total Bilirubin 1.3 (*)    All other components within normal limits  CBG MONITORING, ED - Abnormal; Notable for the following components:   Glucose-Capillary 193 (*)    All other components within normal limits  CBG MONITORING, ED - Abnormal; Notable for the following components:   Glucose-Capillary 222 (*)    All other components within normal limits  URINALYSIS, ROUTINE W REFLEX MICROSCOPIC  CBC  CREATININE, SERUM  COMPREHENSIVE METABOLIC PANEL  CBC WITH DIFFERENTIAL/PLATELET  I-STAT CG4 LACTIC ACID, ED    EKG EKG Interpretation  Date/Time:  Sunday Jun 10 2017 16:13:23 EDT Ventricular Rate:  122 PR Interval:    QRS Duration: 76 QT Interval:  315 QTC Calculation: 449 R Axis:   -91 Text Interpretation:  Sinus tachycardia Left anterior fascicular block Abnormal R-wave progression, late transition ST elev, probable normal early repol pattern No significant change since last tracing Confirmed by Wandra Arthurs [phone]) on 06/10/2017 4:38:41 PM   Radiology Ct Head Wo Contrast  Result Date: 06/10/2017 CLINICAL DATA:  Altered mental status. 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 EXAM: CT HEAD WITHOUT CONTRAST TECHNIQUE: Contiguous axial images were obtained from the base of the skull through the vertex without intravenous contrast. COMPARISON:  Brain MRI 06/01/2017.  Head CT scan 06/18/2016. FINDINGS: Brain: Again seen is a large mass lesion in the left temporal lobe with extensive surrounding vasogenic edema. Discrete measurement of this lesion is difficult on uninfused CT scan but it is not grossly changed since the most recent study. There is associated left-to-right midline shift of approximately 0.7 cm, unchanged. Hydrocephalus also appears unchanged. No hemorrhage is identified. Vascular: No hyperdense vessel or unexpected calcification. Skull: Intact.  Sinuses/Orbits: Negative. Other: None. IMPRESSION: No marked change in the patient's left temporal lobe glioblastoma multiforme with associated extensive vasogenic edema, left to right midline shift and hydrocephalus since the patient's brain MRI 06/01/2017. Electronically Signed   By: Inge Rise M.D.   On: 06/10/2017 17:08    Procedures Procedures (including critical care time)  Medications Ordered in ED Medications  aspirin EC tablet 81 mg (has no  administration in time range)  fluticasone (FLONASE) 50 MCG/ACT nasal spray 2 spray (has no administration in time range)  acetaminophen (TYLENOL) tablet 650 mg (has no administration in time range)    Or  acetaminophen (TYLENOL) suppository 650 mg (has no administration in time range)  ondansetron (ZOFRAN) tablet 4 mg (has no administration in time range)    Or  ondansetron (ZOFRAN) injection 4 mg (has no administration in time range)  insulin glargine (LANTUS) injection 5 Units (has no administration in time range)  insulin aspart (novoLOG) injection 0-9 Units (has no administration in time range)  enoxaparin (LOVENOX) injection 40 mg (has no administration in time range)  0.9 %  sodium chloride infusion (has no administration in time range)  levETIRAcetam (KEPPRA) IVPB 1500 mg/ 100 mL premix (has no administration in time range)  dexamethasone (DECADRON) injection 4 mg (has no administration in time range)  hydrALAZINE (APRESOLINE) injection 10 mg (has no administration in time range)  sodium chloride 0.9 % bolus 1,000 mL (0 mLs Intravenous Stopped 06/10/17 2103)     Initial Impression / Assessment and Plan / ED Course  I have reviewed the triage vital signs and the nursing notes.  Pertinent labs & imaging results that were available during my care of the patient were reviewed by me and considered in my medical decision making (see chart for details).    Patient is a 52 year old female with history as above, notable for a rapidly  progressing glioblastoma stoma multiform who presents due to 3 or 4 days of progressive decline in her mental status.  She is here with her mother.  Her mother reports ongoing confusion.  The mother is having difficulty caring for her at home.  The patient is having multiple falls.  She is scheduled to start chemotherapy, but she has not yet done so.  Here, the patient is tachycardic.  Reviewing her prior clinic notes, it appears this may be her baseline, although she is tachycardia to the 120s here.  She has a nonfocal neurologic exam, however she is very disoriented.  CT scan shows no significant changes from her recent MRI.  However, her MRI obtained several days ago showed midline shift and rapid progression of her tumor.  I spoke to oncology regarding the patient's disposition.  I feel that she needs to be admitted to the hospital given her further decline.  Oncology agrees and would like her transferred to [facility] so that they can consider starting chemotherapy.  Hospitalist consulted for admission and transfer to [facility].  Final Clinical Impressions(s) / ED Diagnoses   Final diagnoses:  Altered mental status, unspecified altered mental status type  [facility] (glioblastoma multiforme) [facility])    ED Discharge Orders    None       Clifton James, MD 06/10/17 2302    Drenda Freeze, MD 06/13/17 [id number]

## 2017-06-11 DIAGNOSIS — Z803 Family history of malignant neoplasm of breast: Secondary | ICD-10-CM

## 2017-06-11 DIAGNOSIS — Z66 Do not resuscitate: Secondary | ICD-10-CM

## 2017-06-11 DIAGNOSIS — C719 Malignant neoplasm of brain, unspecified: Secondary | ICD-10-CM

## 2017-06-11 DIAGNOSIS — Z801 Family history of malignant neoplasm of trachea, bronchus and lung: Secondary | ICD-10-CM

## 2017-06-11 DIAGNOSIS — E11319 Type 2 diabetes mellitus with unspecified diabetic retinopathy without macular edema: Secondary | ICD-10-CM

## 2017-06-11 DIAGNOSIS — R4182 Altered mental status, unspecified: Secondary | ICD-10-CM

## 2017-06-11 DIAGNOSIS — G9341 Metabolic encephalopathy: Secondary | ICD-10-CM

## 2017-06-11 LAB — GLUCOSE, CAPILLARY
GLUCOSE-CAPILLARY: 183 mg/dL — AB (ref 65–99)
GLUCOSE-CAPILLARY: 178 mg/dL — AB (ref 65–99)
Glucose-Capillary: 216 mg/dL — ABNORMAL HIGH (ref 65–99)
Glucose-Capillary: 217 mg/dL — ABNORMAL HIGH (ref 65–99)

## 2017-06-11 LAB — MRSA PCR SCREENING: MRSA by PCR: NEGATIVE

## 2017-06-11 LAB — CBC
HEMATOCRIT: 36.6 % (ref 36.0–46.0)
HEMOGLOBIN: 12.1 g/dL (ref 12.0–15.0)
MCH: 29 pg (ref 26.0–34.0)
MCHC: 33.1 g/dL (ref 30.0–36.0)
MCV: 87.8 fL (ref 78.0–100.0)
Platelets: 237 10*3/uL (ref 150–400)
RBC: 4.17 MIL/uL (ref 3.87–5.11)
RDW: 13.8 % (ref 11.5–15.5)
WBC: 11 10*3/uL — AB (ref 4.0–10.5)

## 2017-06-11 LAB — CK: CK TOTAL: 19 U/L — AB (ref 38–234)

## 2017-06-11 LAB — HEMOGLOBIN A1C
HEMOGLOBIN A1C: 6.3 % — AB (ref 4.8–5.6)
MEAN PLASMA GLUCOSE: 134.11 mg/dL

## 2017-06-11 LAB — CREATININE, SERUM
Creatinine, Ser: 0.48 mg/dL (ref 0.44–1.00)
GFR calc Af Amer: >60 mL/min (ref >60)
GFR calc non Af Amer: >60 mL/min (ref >60)

## 2017-06-11 MED ORDER — SODIUM CHLORIDE 0.9 % IV SOLN
200.0000 mg | Freq: Two times a day (BID) | INTRAVENOUS | Status: AC
  Administered 2017-06-11 – 2017-06-12 (×2): 200 mg via INTRAVENOUS
  Filled 2017-06-11 (×3): qty 20

## 2017-06-11 MED ORDER — LACOSAMIDE 200 MG/20ML IV SOLN
100.0000 mg | Freq: Two times a day (BID) | INTRAVENOUS | Status: DC
  Administered 2017-06-12 – 2017-06-14 (×4): 100 mg via INTRAVENOUS
  Filled 2017-06-11 (×4): qty 10

## 2017-06-11 MED ORDER — VANCOMYCIN HCL IN DEXTROSE 1-5 GM/200ML-% IV SOLN
1000.0000 mg | Freq: Once | INTRAVENOUS | Status: AC
  Administered 2017-06-11: 1000 mg via INTRAVENOUS
  Filled 2017-06-11: qty 200

## 2017-06-11 MED ORDER — SODIUM CHLORIDE 0.9 % IV SOLN
1750.0000 mg | INTRAVENOUS | Status: DC
  Filled 2017-06-11: qty 1750

## 2017-06-11 MED ORDER — HALOPERIDOL LACTATE 5 MG/ML IJ SOLN: 2.0000 mg | Freq: Four times a day (QID) | INTRAMUSCULAR | Status: DC | PRN

## 2017-06-11 MED ORDER — INSULIN GLARGINE 100 UNIT/ML ~~LOC~~ SOLN
5.0000 [IU] | Freq: Two times a day (BID) | SUBCUTANEOUS | Status: DC
  Administered 2017-06-11 (×2): 5 [IU] via SUBCUTANEOUS
  Filled 2017-06-11 (×3): qty 0.05

## 2017-06-11 MED ORDER — PIPERACILLIN-TAZOBACTAM 3.375 G IVPB
3.3750 g | Freq: Three times a day (TID) | INTRAVENOUS | Status: DC
  Administered 2017-06-11: 3.375 g via INTRAVENOUS
  Filled 2017-06-11: qty 50

## 2017-06-11 NOTE — Consult Note (Signed)
[facility] CONSULT NOTE  Patient Care Team: Katherine Roan, MD as PCP - General  CHIEF COMPLAINTS/PURPOSE OF CONSULTATION:  Altered Mental Status Glioblastoma  HISTORY OF PRESENTING ILLNESS:  Jaime [name] 52 y.o. female presented with relative abrupt onset of confused/disordered pattern of thinking and behavior.  This started at the end of last week, a day or so after our visit in the clinic.  At that time she was describing frequent staring spells and episodes of involuntary left leg shaking, which have not abated despite increasing (tripling) dose of Keppra.  She and her mother do not describe worsening right sided weakness, though she is still very neglectful.  Denies fever, cough, shortness of breath, dysuria.    MEDICAL HISTORY:  Past Medical History:  Diagnosis Date  . Candida, oral 06/22/2016   Due to steroids 06/22/16  . Diabetes mellitus   . DUB (dysfunctional uterine bleeding)    total abdominal hysterectomy in 2007, ovaries intact  . Furuncle of multiple sites    multiple I and D's  . GERD (gastroesophageal reflux disease)   . Hydradenitis   . Intellectual disability   . 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 ABDOMINAL HYSTERECTOMY    . HYDRADENITIS EXCISION    . TONSILLECTOMY AND ADENOIDECTOMY     in early childhood    SOCIAL HISTORY: Social History   Socioeconomic History  . Marital status: Single    Spouse name: Not on file  . Number of children: Not on file  . Years of education: Not on file  . Highest education level: Not on file  Occupational History  . Not on file  Social Needs  . Financial resource strain: Not on file  . Food insecurity:    Worry: Not on file    Inability: Not on file  . Transportation needs:    Medical: Not on file    Non-medical: Not on file  Tobacco Use  . Smoking status: Never Smoker  . Smokeless tobacco: Never Used   Substance and Sexual Activity  . Alcohol use: No    Alcohol/week: 0.0 oz  . Drug use: No  . Sexual activity: Not Currently    Birth control/protection: Surgical  Lifestyle  . Physical activity:    Days per week: Not on file    Minutes per session: Not on file  . Stress: Not on file  Relationships  . Social connections:    Talks on phone: Not on file    Gets together: Not on file    Attends religious service: Not on file    Active member of club or organization: Not on file    Attends meetings of clubs or organizations: Not on file    Relationship status: Not on file  . Intimate partner violence:    Fear of current or ex partner: Not on file    Emotionally abused: Not on file    Physically abused: Not on file    Forced sexual activity: Not on file  Other Topics Concern  . 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   No kids.   No sexual activity   Lives with her mother    FAMILY HISTORY: Family History  Problem Relation Age of Onset  . Cancer Mother 58  Breast cancer  . Hypertension Mother   . Depression Mother   . Sickle cell trait Mother   . Cancer Father 36       lung cancer, was a long term smoker    ALLERGIES:  has No Known Allergies.  MEDICATIONS:  Current Facility-Administered Medications  Medication Dose Route Frequency Provider Last Rate Last Dose  . 0.9 %  sodium chloride infusion   Intravenous Continuous Rise Patience, MD 75 mL/hr at 06/11/17 1400    . acetaminophen (TYLENOL) tablet 650 mg  650 mg Oral Q6H PRN Rise Patience, MD       Or  . acetaminophen (TYLENOL) suppository 650 mg  650 mg Rectal Q6H PRN Rise Patience, MD      . aspirin EC tablet 81 mg  81 mg Oral Daily Rise Patience, MD   81 mg at 06/11/17 1029  . dexamethasone (DECADRON) injection 4 mg  4 mg Intravenous Q6H Rise Patience, MD    4 mg at 06/11/17 1135  . enoxaparin (LOVENOX) injection 40 mg  40 mg Subcutaneous Daily Rise Patience, MD   40 mg at 06/11/17 1302  . fluticasone (FLONASE) 50 MCG/ACT nasal spray 2 spray  2 spray Each Nare Daily Rise Patience, MD   2 spray at 06/11/17 1030  . hydrALAZINE (APRESOLINE) injection 10 mg  10 mg Intravenous Q4H PRN Rise Patience, MD      . insulin aspart (novoLOG) injection 0-9 Units  0-9 Units Subcutaneous TID WC Rise Patience, MD   2 Units at 06/11/17 1136  . insulin glargine (LANTUS) injection 5 Units  5 Units Subcutaneous BID Charlynne Cousins, MD   5 Units at 06/11/17 1028  . levETIRAcetam (KEPPRA) IVPB 1500 mg/ 100 mL premix  1,500 mg Intravenous BID Rise Patience, MD   Stopped at 06/11/17 1041  . ondansetron (ZOFRAN) tablet 4 mg  4 mg Oral Q6H PRN Rise Patience, MD       Or  . ondansetron [facility]) injection 4 mg  4 mg Intravenous Q6H PRN Rise Patience, MD       Facility-Administered Medications Ordered in Other Encounters  Medication Dose Route Frequency Provider Last Rate Last Dose  . 0.9 %  sodium chloride infusion   Intravenous Continuous Ladell Pier, MD   Stopped at 07/27/16 1630    REVIEW OF SYSTEMS:   Limited by dyshpasia, neglect   PHYSICAL EXAMINATION: Vitals:   06/11/17 1200 06/11/17 1400  BP: 118/78 118/82  Pulse: 93 (!) 109  Resp: 13 16  Temp:    SpO2: 97% 99%   KPS: 50. General: Alert, cooperative, pleasant, in no acute distress Head: Normal EENT: No conjunctival injection or scleral icterus. Oral mucosa moist Lungs: Resp effort normal Cardiac: Regular rate and rhythm Abdomen: Soft, non-distended abdomen Skin: No rashes cyanosis or petechiae. Extremities: No clubbing or edema  NEUROLOGIC EXAM: Mental Status: Awake, alert, attentive to examiner. Oriented to self but not environment.  Thinks she's at home. Language notable for semantic errors. Comprehension intact to simple commands.  Noted  spatial neglect of right environment and body.    Cranial Nerves: Visual acuity is grossly normal. 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 Extra-ocular movements intact. No ptosis. Face is symmetric, tongue midline. Motor: Tone and bulk are normal. Power is full in both arms and legs. Reflexes are symmetric, no pathologic reflexes present. Intact finger to nose bilaterally Sensory: Intact to light touch and  temperature Gait: deferred      LABORATORY DATA:  I have reviewed the data as listed Lab Results  Component Value Date   WBC 11.0 (H) 06/10/2017   HGB 12.1 06/10/2017   HCT 36.6 06/10/2017   MCV 87.8 06/10/2017   PLT 237 06/10/2017   Recent Labs    08/21/16 2035  08/24/16 1455 08/25/16 0929 08/26/16 0426 08/29/16 1019  06/01/17 1319 06/10/17 1612 06/10/17 2352  NA  --    < >  --  139 140 139  --   --  139  --   K  --    < >  --  3.1* 3.7 3.2*  --   --  4.7  --   CL  --    < >  --  105 106  --   --   --  101  --   CO2  --    < >  --  25 26 26   --   --  27  --   GLUCOSE  --    < >  --  251* 169* 186*  --   --  175*  --   BUN  --    < >  --  12 8 15.5  --   --  11  --   CREATININE  --    < >  --  0.38* 0.33* 0.7   < > 0.52 0.58 0.48  CALCIUM  --    < >  --  8.8* 8.6* 9.7  --   --  9.4  --   GFRNONAA  --    < >  --  >60 >60  --    < > >60 >60 >60  GFRAA  --    < >  --  >60 >60  --    < > >60 >60 >60  PROT  --    < > 6.3*  --   --  6.6  --   --  6.3*  --   ALBUMIN  --    < > 3.0*  --   --  3.3*  --   --  3.5  --   AST  --    < > 83*  --   --  23  --   --  32  --   ALT  --    < > 64*  --   --  54  --   --  16  --   ALKPHOS  --    < > 76  --   --  87  --   --  39  --   BILITOT 1.7*   < > 1.0  --   --  0.71  --   --  1.3*  --   BILIDIR 0.3  --  0.2  --   --   --   --   --   --   --   IBILI 1.4*  --  0.8  --   --   --   --   --   --   --    < > = values in this interval not displayed.    RADIOGRAPHIC STUDIES: I have personally reviewed the radiological images  as listed and agreed with the findings in the report.  Ct Head Wo Contrast  Result Date: 06/10/2017 CLINICAL DATA:  Altered mental status. 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 EXAM: CT HEAD WITHOUT  CONTRAST TECHNIQUE: Contiguous axial images were obtained from the base of the skull through the vertex without intravenous contrast. COMPARISON:  Brain MRI 06/01/2017.  Head CT scan 06/18/2016. FINDINGS: Brain: Again seen is a large mass lesion in the left temporal lobe with extensive surrounding vasogenic edema. Discrete measurement of this lesion is difficult on uninfused CT scan but it is not grossly changed since the most recent study. There is associated left-to-right midline shift of approximately 0.7 cm, unchanged. Hydrocephalus also appears unchanged. No hemorrhage is identified. Vascular: No hyperdense vessel or unexpected calcification. Skull: Intact. Sinuses/Orbits: Negative. Other: None. IMPRESSION: No marked change in the patient's left temporal lobe glioblastoma multiforme with associated extensive vasogenic edema, left to right midline shift and hydrocephalus since the patient's brain MRI 06/01/2017. Electronically Signed   By: Inge Rise M.D.   On: 06/10/2017 17:08   Ct Angio Chest Pe W Or Wo Contrast  Result Date: 06/11/2017 CLINICAL DATA:  Acute onset of tachycardia.  Mental status changes. EXAM: CT ANGIOGRAPHY CHEST WITH CONTRAST TECHNIQUE: Multidetector CT imaging of the chest was performed using the standard protocol during bolus administration of intravenous contrast. Multiplanar CT image reconstructions and MIPs were obtained to evaluate the vascular anatomy. CONTRAST:  158mL ISOVUE-370 IOPAMIDOL (ISOVUE-370) INJECTION 76% COMPARISON:  CT of the chest performed 06/20/2016 FINDINGS: Cardiovascular:  There is no evidence of pulmonary embolus. The heart is borderline normal in size. The thoracic aorta is grossly unremarkable. The great vessels are within normal  limits. Mediastinum/Nodes: The mediastinum is unremarkable in appearance. No mediastinal lymphadenopathy is seen. No pericardial effusion is identified. The visualized portions of the thyroid gland are unremarkable. No axillary lymphadenopathy is seen. Lungs/Pleura: Focal airspace opacity is noted at the left lingula and left lung base. Minimal airspace opacity was noted at the left lingula in 2018. Given its appearance, this more likely reflects pneumonia. Would consider follow-up PA and lateral chest radiograph after completion of treatment for pneumonia. Upper Abdomen: The visualized portions of the liver and spleen are unremarkable. The gallbladder is grossly unremarkable in appearance. The visualized portions of the pancreas and adrenal glands are within normal limits. Mild nonspecific perinephric stranding is noted bilaterally. Musculoskeletal: No acute osseous abnormalities are identified. The visualized musculature is unremarkable in appearance. Review of the MIP images confirms the above findings. IMPRESSION: 1. No evidence of pulmonary embolus. 2. Focal airspace opacity at the left lingula and left air base. This is more prominent than in 2018. Given its appearance, this more likely reflects pneumonia. Followup PA and lateral chest X-ray is recommended in 3-4 weeks following trial of antibiotic therapy to ensure resolution and exclude underlying malignancy. Electronically Signed   By: Garald Balding M.D.   On: 06/11/2017 00:08   Mr Jeri Cos VV Contrast  Result Date: 06/01/2017 CLINICAL DATA:  Glioblastoma multiform left temporal lobe EXAM: MRI HEAD WITHOUT AND WITH CONTRAST TECHNIQUE: Multiplanar, multiecho pulse sequences of the brain and surrounding structures were obtained without and with intravenous contrast. CONTRAST:  79mL MULTIHANCE GADOBENATE DIMEGLUMINE 529 MG/ML IV SOLN COMPARISON:  MRI 04/09/2017, 08/11/2016 FINDINGS: Brain: Marked progression of enhancing tumor in the left medial temporal  lobe. Lobular masslike area of enhancement now measures approximately 5.7 x 3.8 x 4.3 cm. This projects into the left lateral ventricle. This component of the mass shows progressive susceptibility suggesting hemorrhage which has developed since the prior study. Significant progression of white matter hyperintensity throughout the left temporal lobe with extension into the left occipital parietal lobe. Nodular masslike enhancement  in the perimesencephalic cistern anteriorly and on the right has progressed compatible with CSF spread of tumor. Small amount of enhancement in the floor of the fourth ventricle may also represent tumor. Progressive hydrocephalus. Progressive midline shift now approximately 7 mm. Progressive periventricular white matter hyperintensity. Some of this is likely due to transependymal resorption of CSF due to hydrocephalus. Negative for acute infarct. Vascular: Normal arterial flow void Skull and upper cervical spine: Negative Sinuses/Orbits: Negative Other: None IMPRESSION: Rapid progression of tumor since the prior study. The main component of the tumor in the left temporal lobe shows significant progression in size in interval hemorrhage. Extensive white matter hyperintensity throughout the left temporal lobe extending into the left occipital parietal lobes also has progressed. Increased mass-effect and 7 mm midline shift to the right. 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 Progressive hydrocephalus These results will be called to the ordering clinician or representative by the Radiologist Assistant, and communication documented in the PACS or zVision Dashboard. Electronically Signed   By: Franchot Gallo M.D.   On: 06/01/2017 17:23    ASSESSMENT & PLAN:  Altered Mental Status Glioblastoma  Jaime [name] presents with stepwise decline in mental status compared to examination last week.  There are dysphasic elements today. She still  has dense right side hemineglect and visual field impairment.   Acuity of change combined with clinical presentation concerning for focal seizures which may be refractory to Keppra monotherapy.  There is questionable consolidation on CT chest which could represent underlying provocation.   Recommend loading with Vimpat 200mg  Q12x 1 day, then maintenance dose of 100mg  Q12.  First dose can be this afternoon. Continue Keppra at 1500mg  Q12 for time being.  Continue high dose dexamethasone while inpatient.  Please monitor for episodes of left leg shaking.  Call/page with questions or clinical updates.  The total time spent in the encounter was 55 minutes and more than 50% was on counseling and review of test results     Ventura Sellers, MD 06/11/2017 2:18 PM

## 2017-06-11 NOTE — Progress Notes (Signed)
Transferred to room 1338 via bed. Report given to RN.

## 2017-06-11 NOTE — Progress Notes (Signed)
[facility] PROGRESS NOTE    Progress Note  Jaime [name]  WCH:[id number] DOB: 05-21-1965 DOA: 06/10/2017 PCP: Katherine Roan, MD     Brief Narrative:   Jaime [name] is an 52 y.o. 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 Mickeal Skinner recently neuro oncologist presents with worsening visual impairment and confusion, CT scan of the head showed vasogenic edema with mild left shift hydrocephalus since MRI  Assessment/Plan:   Acute metabolic encephalopathy 2/2 Glioblastoma multiforme of temporal lobe [facility]): He was continue on IV Decadron every 4.  She continues to be confused on physical exam.  Keep the patient n.p.o. for now. We have consulted Dr. Mickeal Skinner her neuro oncologist.  Diabetes mellitus type 2 with retinopathy ([place]) Continue sliding scale insulin CBGs every 4 hours she is n.p.o.  Essential hypertension Continue hold antihypertensive medication use hydralazine IV as needed.  Seizures ([place]) She was placed recently on Keppra, her mother noticed lower extremity seizure-like activity.   DVT prophylaxis: lovenpox Family Communication:mother Disposition Plan/Barrier to D/C: probably SNF Code Status:     Code Status Orders  (From admission, onward)        Start     Ordered   06/10/17 2241  Do not attempt resuscitation (DNR)  Continuous    Question Answer Comment  In the event of cardiac or respiratory ARREST Do not call a "code blue"   In the event of cardiac or respiratory ARREST Do not perform Intubation, CPR, defibrillation or ACLS   In the event of cardiac or respiratory ARREST Use medication by any route, position, wound care, and other measures to relive pain and suffering. May use oxygen, suction and manual treatment of airway obstruction as needed for comfort.      06/10/17 2242    Code Status History    Date Active Date Inactive Code Status Order ID Comments  User Context   08/20/2016 1532 08/26/2016 1632 DNR [id number]  Benito Mccreedy, MD Inpatient   06/18/2016 0543 06/22/2016 2134 Full Code [id number]  Omar Person, NP ED    Advance Directive Documentation     Most Recent Value  Type of Advance Directive  Healthcare Power of Attorney  Pre-existing out of facility DNR order (yellow form or pink MOST form)  -  "MOST" Form in Place?  -        IV Access:    Peripheral IV   Procedures and diagnostic studies:   Ct Head Wo Contrast  Result Date: 06/10/2017 CLINICAL DATA:  Altered mental status. 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 EXAM: CT HEAD WITHOUT CONTRAST TECHNIQUE: Contiguous axial images were obtained from the base of the skull through the vertex without intravenous contrast. COMPARISON:  Brain MRI 06/01/2017.  Head CT scan 06/18/2016. FINDINGS: Brain: Again seen is a large mass lesion in the left temporal lobe with extensive surrounding vasogenic edema. Discrete measurement of this lesion is difficult on uninfused CT scan but it is not grossly changed since the most recent study. There is associated left-to-right midline shift of approximately 0.7 cm, unchanged. Hydrocephalus also appears unchanged. No hemorrhage is identified. Vascular: No hyperdense vessel or unexpected calcification. Skull: Intact. Sinuses/Orbits: Negative. Other: None. IMPRESSION: No marked change in the patient's left temporal lobe glioblastoma multiforme with associated extensive vasogenic edema, left to right midline shift and hydrocephalus since the patient's brain MRI 06/01/2017. Electronically Signed   By: Inge Rise M.D.  On: 06/10/2017 17:08   Ct Angio Chest Pe W Or Wo Contrast  Result Date: 06/11/2017 CLINICAL DATA:  Acute onset of tachycardia.  Mental status changes. EXAM: CT ANGIOGRAPHY CHEST WITH CONTRAST TECHNIQUE: Multidetector CT imaging of the chest was performed using the standard protocol during bolus  administration of intravenous contrast. Multiplanar CT image reconstructions and MIPs were obtained to evaluate the vascular anatomy. CONTRAST:  130mL ISOVUE-370 IOPAMIDOL (ISOVUE-370) INJECTION 76% COMPARISON:  CT of the chest performed 06/20/2016 FINDINGS: Cardiovascular:  There is no evidence of pulmonary embolus. The heart is borderline normal in size. The thoracic aorta is grossly unremarkable. The great vessels are within normal limits. Mediastinum/Nodes: The mediastinum is unremarkable in appearance. No mediastinal lymphadenopathy is seen. No pericardial effusion is identified. The visualized portions of the thyroid gland are unremarkable. No axillary lymphadenopathy is seen. Lungs/Pleura: Focal airspace opacity is noted at the left lingula and left lung base. Minimal airspace opacity was noted at the left lingula in 2018. Given its appearance, this more likely reflects pneumonia. Would consider follow-up PA and lateral chest radiograph after completion of treatment for pneumonia. Upper Abdomen: The visualized portions of the liver and spleen are unremarkable. The gallbladder is grossly unremarkable in appearance. The visualized portions of the pancreas and adrenal glands are within normal limits. Mild nonspecific perinephric stranding is noted bilaterally. Musculoskeletal: No acute osseous abnormalities are identified. The visualized musculature is unremarkable in appearance. Review of the MIP images confirms the above findings. IMPRESSION: 1. No evidence of pulmonary embolus. 2. Focal airspace opacity at the left lingula and left air base. This is more prominent than in 2018. Given its appearance, this more likely reflects pneumonia. Followup PA and lateral chest X-ray is recommended in 3-4 weeks following trial of antibiotic therapy to ensure resolution and exclude underlying malignancy. Electronically Signed   By: Garald Balding M.D.   On: 06/11/2017 00:08     Medical Consultants:     None.  Anti-Infectives:   None  Subjective:    Jaime [name] patient still confused.  Objective:    Vitals:   06/11/17 0030 06/11/17 0115 06/11/17 0400 06/11/17 0600  BP: 133/86 139/86 109/73 111/82  Pulse: 94 83 86 86  Resp: 15 15 14 15   Temp:   98.8 F (37.1 C)   TempSrc:   Oral   SpO2: 99% 100% 96% 99%  Weight:  62.5 kg (137 lb 12.6 oz)    Height:  5\' 2"  (1.575 m)      Intake/Output Summary (Last 24 hours) at 06/11/2017 0727 Last data filed at 06/11/2017 0600 Gross per 24 hour  Intake 2060 ml  Output 600 ml  Net 1460 ml   Filed Weights   06/10/17 1550 06/11/17 0115  Weight: 64 kg (141 lb) 62.5 kg (137 lb 12.6 oz)    Exam: General exam: In no acute distress. Respiratory system: Good air movement and clear to auscultation. Cardiovascular system: S1 & S2 heard, RRR.  Gastrointestinal system: Abdomen is nondistended, soft and nontender.  Central nervous system: Seems to have no focal deficit on physical exam. Extremities: No pedal edema. Skin: No rashes, lesions or ulcers   Data Reviewed:    Labs: Basic Metabolic Panel: Recent Labs  Lab 06/10/17 1612 06/10/17 2352  NA 139  --   K 4.7  --   CL 101  --   CO2 27  --   GLUCOSE 175*  --   BUN 11  --   CREATININE 0.58 0.48  CALCIUM  9.4  --    GFR Estimated Creatinine Clearance: 73.2 mL/min (by C-G formula based on SCr of 0.48 mg/dL). Liver Function Tests: Recent Labs  Lab 06/10/17 1612  AST 32  ALT 16  ALKPHOS 39  BILITOT 1.3*  PROT 6.3*  ALBUMIN 3.5   No results for input(s): LIPASE, AMYLASE in the last 168 hours. No results for input(s): AMMONIA in the last 168 hours. Coagulation profile No results for input(s): INR, PROTIME in the last 168 hours.  CBC: Recent Labs  Lab 06/10/17 1612 06/10/17 2352  WBC 15.5* 11.0*  NEUTROABS 14.5*  --   HGB 13.2 12.1  HCT 39.6 36.6  MCV 88.8 87.8  PLT 257 237   Cardiac Enzymes: No results for input(s): CKTOTAL, CKMB, CKMBINDEX,  TROPONINI in the last 168 hours. BNP (last 3 results) No results for input(s): PROBNP in the last 8760 hours. CBG: Recent Labs  Lab 06/10/17 1636 06/10/17 1719  GLUCAP 193* 222*   D-Dimer: No results for input(s): DDIMER in the last 72 hours. Hgb A1c: No results for input(s): HGBA1C in the last 72 hours. Lipid Profile: No results for input(s): CHOL, HDL, LDLCALC, TRIG, CHOLHDL, LDLDIRECT in the last 72 hours. Thyroid function studies: No results for input(s): TSH, T4TOTAL, T3FREE, THYROIDAB in the last 72 hours.  Invalid input(s): FREET3 Anemia work up: No results for input(s): VITAMINB12, FOLATE, FERRITIN, TIBC, IRON, RETICCTPCT in the last 72 hours. Sepsis Labs: Recent Labs  Lab 06/10/17 1612 06/10/17 2209 06/10/17 2352  WBC 15.5*  --  11.0*  LATICACIDVEN  --  1.48  --    Microbiology Recent Results (from the past 240 hour(s))  MRSA PCR Screening     Status: None   Collection Time: 06/11/17  1:17 AM  Result Value Ref Range Status   MRSA by PCR NEGATIVE NEGATIVE Final    Comment:        The GeneXpert MRSA Assay (FDA approved for NASAL specimens only), is one component of a comprehensive MRSA colonization surveillance program. It is not intended to diagnose MRSA infection nor to guide or monitor treatment for MRSA infections. Performed at [facility], [place] [address], [place], [place] [address]      Medications:   . aspirin EC  81 mg Oral Daily  . dexamethasone  4 mg Intravenous Q6H  . enoxaparin (LOVENOX) injection  40 mg Subcutaneous Daily  . fluticasone  2 spray Each Nare Daily  . insulin aspart  0-9 Units Subcutaneous TID WC  . insulin glargine  5 Units Subcutaneous Daily   Continuous Infusions: . sodium chloride 75 mL/hr at 06/11/17 0600  . levETIRAcetam Stopped (06/11/17 0010)  . piperacillin-tazobactam (ZOSYN)  IV Stopped (06/11/17 0659)  . vancomycin        LOS: 1 day   Charlynne Cousins  [facility] Pager  [phone]  *Please refer to [email or web address], password [email or web address] to get updated schedule on who will round on this patient, as hospitalists switch teams weekly. If 7PM-7AM, please contact night-coverage at [email or web address], password [email or web address] for any overnight needs.  06/11/2017, 7:27 AM

## 2017-06-11 NOTE — Progress Notes (Signed)
Pt. Received from ICU, transferred to room 38, mother at bedside. Pt. Oriented to self only, family/pt. Oriented to room. Call bell within in reach, bed locked. Pt with large bowel movement (2 additional) after cleaning, bottom excoriated/red/bleeding, cleaned and barrier cream applied. Pure wick changed. Lung sound clear, S1S2, BLE edema, LE weakness. Denied any pain. Pt. Loves apples juice, apple juice at bedside. Mother remains at bedside. No distress noted, ID bracelet, DNR/Allergy bracelet in placed. Will continue to monitor.

## 2017-06-11 NOTE — Progress Notes (Signed)
Pharmacy Antibiotic Note  Jaime [name] is a 52 y.o. female admitted on 06/10/2017 with pneumonia.  Pharmacy has been consulted for Vancomycin and Zosyn dosing.  Plan: Zosyn 3.375g IV q8h (4 hour infusion).   Vancomycin 1gm iv x1, then 1750mg  iv q24hr  Goal AUC = 400 - 500 for all indications, except meningitis (goal AUC > 500 and Cmin 15-20 mcg/mL)   Height: 5\' 2"  (157.5 cm) Weight: 137 lb 12.6 oz (62.5 kg) IBW/kg (Calculated) : 50.1  Temp (24hrs), Avg:98.5 F (36.9 C), Min:98.2 F (36.8 C), Max:98.8 F (37.1 C)  Recent Labs  Lab 06/10/17 1612 06/10/17 2209 06/10/17 2352  WBC 15.5*  --  11.0*  CREATININE 0.58  --  0.48  LATICACIDVEN  --  1.48  --     Estimated Creatinine Clearance: 73.2 mL/min (by C-G formula based on SCr of 0.48 mg/dL).    No Known Allergies  Antimicrobials this admission: Vancomycin 06/11/2017 >> Zosyn 06/11/2017 >>   Dose adjustments this admission: -  Microbiology results: -  Thank you for allowing pharmacy to be a part of this patient's care.  Jaime [name] 06/11/2017 2:52 AM

## 2017-06-12 DIAGNOSIS — J181 Lobar pneumonia, unspecified organism: Secondary | ICD-10-CM

## 2017-06-12 DIAGNOSIS — G939 Disorder of brain, unspecified: Secondary | ICD-10-CM

## 2017-06-12 LAB — GLUCOSE, CAPILLARY
GLUCOSE-CAPILLARY: 237 mg/dL — AB (ref 65–99)
GLUCOSE-CAPILLARY: 203 mg/dL — AB (ref 65–99)
Glucose-Capillary: 377 mg/dL — ABNORMAL HIGH (ref 65–99)
Glucose-Capillary: 222 mg/dL — ABNORMAL HIGH (ref 65–99)

## 2017-06-12 MED ORDER — SODIUM CHLORIDE 0.9 % IV SOLN
INTRAVENOUS | Status: AC
  Administered 2017-06-12: via INTRAVENOUS

## 2017-06-12 MED ORDER — CEFTRIAXONE SODIUM 1 G IJ SOLR
1.0000 g | INTRAMUSCULAR | Status: AC
  Administered 2017-06-12 – 2017-06-15 (×4): 1 g via INTRAVENOUS
  Filled 2017-06-12 (×4): qty 1

## 2017-06-12 MED ORDER — INSULIN ASPART 100 UNIT/ML ~~LOC~~ SOLN
0.0000 [IU] | Freq: Every day | SUBCUTANEOUS | Status: DC
  Administered 2017-06-12 – 2017-06-17 (×6): 2 [IU] via SUBCUTANEOUS
  Administered 2017-06-19: 0 [IU] via SUBCUTANEOUS

## 2017-06-12 MED ORDER — INSULIN ASPART 100 UNIT/ML ~~LOC~~ SOLN
0.0000 [IU] | Freq: Three times a day (TID) | SUBCUTANEOUS | Status: DC
  Administered 2017-06-12: 15 [IU] via SUBCUTANEOUS
  Administered 2017-06-12: 5 [IU] via SUBCUTANEOUS
  Administered 2017-06-13: 3 [IU] via SUBCUTANEOUS
  Administered 2017-06-13 (×2): 5 [IU] via SUBCUTANEOUS
  Administered 2017-06-14 (×3): 3 [IU] via SUBCUTANEOUS
  Administered 2017-06-15: 2 [IU] via SUBCUTANEOUS
  Administered 2017-06-15: 5 [IU] via SUBCUTANEOUS
  Administered 2017-06-15: 3 [IU] via SUBCUTANEOUS
  Administered 2017-06-16 (×2): 2 [IU] via SUBCUTANEOUS
  Administered 2017-06-16: 5 [IU] via SUBCUTANEOUS
  Administered 2017-06-17: 2 [IU] via SUBCUTANEOUS
  Administered 2017-06-17 (×2): 3 [IU] via SUBCUTANEOUS
  Administered 2017-06-18: 2 [IU] via SUBCUTANEOUS
  Administered 2017-06-18 – 2017-06-19 (×4): 3 [IU] via SUBCUTANEOUS
  Administered 2017-06-20: 8 [IU] via SUBCUTANEOUS
  Administered 2017-06-20: 2 [IU] via SUBCUTANEOUS
  Administered 2017-06-21: 3 [IU] via SUBCUTANEOUS
  Administered 2017-06-21: 2 [IU] via SUBCUTANEOUS
  Administered 2017-06-21 – 2017-06-23 (×4): 3 [IU] via SUBCUTANEOUS

## 2017-06-12 MED ORDER — INSULIN GLARGINE 100 UNIT/ML ~~LOC~~ SOLN
10.0000 [IU] | Freq: Two times a day (BID) | SUBCUTANEOUS | Status: DC
  Administered 2017-06-12 – 2017-06-23 (×23): 10 [IU] via SUBCUTANEOUS
  Filled 2017-06-12 (×26): qty 0.1

## 2017-06-12 MED ORDER — SODIUM CHLORIDE 0.9 % IV SOLN
500.0000 mg | INTRAVENOUS | Status: DC
  Administered 2017-06-12 – 2017-06-13 (×2): 500 mg via INTRAVENOUS
  Filled 2017-06-12 (×3): qty 500

## 2017-06-12 MED ORDER — INSULIN ASPART 100 UNIT/ML ~~LOC~~ SOLN
3.0000 [IU] | Freq: Three times a day (TID) | SUBCUTANEOUS | Status: DC
  Administered 2017-06-12 – 2017-06-22 (×30): 3 [IU] via SUBCUTANEOUS

## 2017-06-12 NOTE — Evaluation (Signed)
Physical Therapy Evaluation Patient Details Name: Jaime [name] MRN: [id number] DOB: December 15, 1965 Today's Date: 06/12/2017   History of Present Illness  Jaime [name] is an 52 y.o. female past medical history of high-grade glioblastoma multiforme , presents with worsening visual impairment and confusion, CT scan of the head showed vasogenic edema with mild left shift hydrocephalus since MRI  Clinical Impression  The patient is pleasant and participated in mobility, requiring assistance of 1, My need more assistance to ambulate. Mother Reports plans for patient to return home. Pt admitted with above diagnosis. Pt currently with functional limitations due to the deficits listed below (see PT Problem List).  Pt will benefit from skilled PT to increase their independence and safety with mobility to allow discharge to the venue listed below.       Follow Up Recommendations Home health PT    Equipment Recommendations  None recommended by PT    Recommendations for Other Services       Precautions / Restrictions Precautions Precautions: Fall      Mobility  Bed Mobility Overal bed mobility: Needs Assistance Bed Mobility: Supine to Sit;Sit to Supine     Supine to sit: Mod assist Sit to supine: Mod assist   General bed mobility comments: self initiates trying to sit up, decreased trunk flexion, assist with legs and trunk to sitting and back into bed. Tended to fall backward.  Transfers Overall transfer level: Needs assistance   Transfers: Sit to/from Stand Sit to Stand: Mod assist         General transfer comment: holding  to back of chair in front of patient, mod assist to rise and steady, leaning to the left.   cues to  sit down. Attempted small sidesteps but decreased left leg to side step.  Ambulation/Gait                Stairs            Wheelchair Mobility    Modified Rankin (Stroke Patients Only)       Balance Overall balance assessment: Needs  assistance Sitting-balance support: Feet supported;Bilateral upper extremity supported Sitting balance-Leahy Scale: Poor   Postural control: Posterior lean Standing balance support: During functional activity;Bilateral upper extremity supported Standing balance-Leahy Scale: Zero                               Pertinent Vitals/Pain Pain Assessment: Faces Faces Pain Scale: No hurt    Home Living Family/patient expects to be discharged to:: Private residence Living Arrangements: Parent Available Help at Discharge: Family Type of Home: House Home Access: Stairs to enter   Technical brewer of Steps: 4   Home Equipment: Environmental consultant - 4 wheels;Cane - single point;Cane - quad      Prior Function Level of Independence: Needs assistance   Gait / Transfers Assistance Needed: ambulated with cane, mother states patient had been able to ambulate to BR           Hand Dominance        Extremity/Trunk Assessment   Upper Extremity Assessment Upper Extremity Assessment: RUE deficits/detail;LUE deficits/detail RUE Deficits / Details: decreased initiation to "hi 5" with mit on  R but readily "Hi 5" with L RUE Coordination: decreased fine motor    Lower Extremity Assessment Lower Extremity Assessment: LLE deficits/detail;RLE deficits/detail RLE Deficits / Details: decreased stepping sideways LLE Deficits / Details: decreased stepping sideways       Communication  Communication: Expressive difficulties  Cognition Arousal/Alertness: Awake/alert Behavior During Therapy: WFL for tasks assessed/performed;Impulsive Overall Cognitive Status: History of cognitive impairments - at baseline Area of Impairment: Orientation;Awareness;Problem solving                 Orientation Level: Situation           Problem Solving: Slow processing General Comments: speaks 1-2 words.       General Comments      Exercises     Assessment/Plan    PT Assessment Patient  needs continued PT services  PT Problem List Decreased strength;Decreased cognition;Decreased knowledge of use of DME;Decreased activity tolerance;Decreased safety awareness;Decreased balance;Decreased knowledge of precautions;Decreased mobility       PT Treatment Interventions DME instruction;Therapeutic exercise;Gait training;Functional mobility training;Therapeutic activities;Patient/family education;Cognitive remediation;Stair training    PT Goals (Current goals can be found in the Care Plan section)  Acute Rehab PT Goals Patient Stated Goal: plans to return home PT Goal Formulation: With family Time For Goal Achievement: 06/26/17 Potential to Achieve Goals: Fair    Frequency Min 2X/week   Barriers to discharge        Co-evaluation               AM-PAC PT "6 Clicks" Daily Activity  Outcome Measure Difficulty turning over in bed (including adjusting bedclothes, sheets and blankets)?: Unable Difficulty moving from lying on back to sitting on the side of the bed? 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

## 2017-06-12 NOTE — Progress Notes (Signed)
[facility] PROGRESS NOTE    Progress Note  Jaime [name]  GNF:[id number] DOB: 11-17-1965 DOA: 06/10/2017 PCP: Katherine Roan, MD     Brief Narrative:   Jaime [name] is an 52 y.o. 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 Mickeal Skinner recently neuro oncologist presents with worsening visual impairment and confusion, CT scan of the head showed vasogenic edema with mild left shift hydrocephalus since MRI  Assessment/Plan:   Acute metabolic encephalopathy 2/2 Glioblastoma multiforme of temporal lobe (HCC)/seizure disorder: He was continue on IV Decadron every 4.   Keep the patient n.p.o. for now. We have consulted Dr. Mickeal Skinner her neuro oncologist I recommended start Vimpat appreciate assistance. It is possible that her seizures may made worse by the possibility of an infectious etiology.  Possible community-acquired pneumonia: Continue IV Rocephin and azithromycin, she will need a repeat chest x-ray in 6 weeks.  Her leukocytosis is improving, will repeat a CBC tomorrow morning.  She has remained afebrile.  Diabetes mellitus type 2 with retinopathy ([place]) Continue sliding scale insulin CBGs every 4 hours she is n.p.o.  Essential hypertension Continue hold antihypertensive medication use hydralazine IV as needed.  Seizures ([place]) She was placed recently on Keppra, her mother noticed lower extremity seizure-like activity.   DVT prophylaxis: lovenpox Family Communication:mother Disposition Plan/Barrier to D/C: probably SNF Code Status:     Code Status Orders  (From admission, onward)        Start     Ordered   06/10/17 2241  Do not attempt resuscitation (DNR)  Continuous    Question Answer Comment  In the event of cardiac or respiratory ARREST Do not call a "code blue"   In the event of cardiac or respiratory ARREST Do not perform Intubation, CPR, defibrillation or ACLS   In  the event of cardiac or respiratory ARREST Use medication by any route, position, wound care, and other measures to relive pain and suffering. May use oxygen, suction and manual treatment of airway obstruction as needed for comfort.      06/10/17 2242    Code Status History    Date Active Date Inactive Code Status Order ID Comments User Context   08/20/2016 1532 08/26/2016 1632 DNR [id number]  Jaime Mccreedy, MD Inpatient   06/18/2016 0543 06/22/2016 2134 Full Code [id number]  Jaime Person, NP ED    Advance Directive Documentation     Most Recent Value  Type of Advance Directive  Healthcare Power of Attorney  Pre-existing out of facility DNR order (yellow form or pink MOST form)  -  "MOST" Form in Place?  -        IV Access:    Peripheral IV   Procedures and diagnostic studies:   Ct Head Wo Contrast  Result Date: 06/10/2017 CLINICAL DATA:  Altered mental status. 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 EXAM: CT HEAD WITHOUT CONTRAST TECHNIQUE: Contiguous axial images were obtained from the base of the skull through the vertex without intravenous contrast. COMPARISON:  Brain MRI 06/01/2017.  Head CT scan 06/18/2016. FINDINGS: Brain: Again seen is a large mass lesion in the left temporal lobe with extensive surrounding vasogenic edema. Discrete measurement of this lesion is difficult on uninfused CT scan but it is not grossly changed since the most recent study. There is associated left-to-right midline shift of approximately 0.7 cm, unchanged. Hydrocephalus also appears unchanged. No hemorrhage is identified. Vascular: No hyperdense  vessel or unexpected calcification. Skull: Intact. Sinuses/Orbits: Negative. Other: None. IMPRESSION: No marked change in the patient's left temporal lobe glioblastoma multiforme with associated extensive vasogenic edema, left to right midline shift and hydrocephalus since the patient's brain MRI 06/01/2017. Electronically Signed    By: Inge Rise M.D.   On: 06/10/2017 17:08   Ct Angio Chest Pe W Or Wo Contrast  Result Date: 06/11/2017 CLINICAL DATA:  Acute onset of tachycardia.  Mental status changes. EXAM: CT ANGIOGRAPHY CHEST WITH CONTRAST TECHNIQUE: Multidetector CT imaging of the chest was performed using the standard protocol during bolus administration of intravenous contrast. Multiplanar CT image reconstructions and MIPs were obtained to evaluate the vascular anatomy. CONTRAST:  173mL ISOVUE-370 IOPAMIDOL (ISOVUE-370) INJECTION 76% COMPARISON:  CT of the chest performed 06/20/2016 FINDINGS: Cardiovascular:  There is no evidence of pulmonary embolus. The heart is borderline normal in size. The thoracic aorta is grossly unremarkable. The great vessels are within normal limits. Mediastinum/Nodes: The mediastinum is unremarkable in appearance. No mediastinal lymphadenopathy is seen. No pericardial effusion is identified. The visualized portions of the thyroid gland are unremarkable. No axillary lymphadenopathy is seen. Lungs/Pleura: Focal airspace opacity is noted at the left lingula and left lung base. Minimal airspace opacity was noted at the left lingula in 2018. Given its appearance, this more likely reflects pneumonia. Would consider follow-up PA and lateral chest radiograph after completion of treatment for pneumonia. Upper Abdomen: The visualized portions of the liver and spleen are unremarkable. The gallbladder is grossly unremarkable in appearance. The visualized portions of the pancreas and adrenal glands are within normal limits. Mild nonspecific perinephric stranding is noted bilaterally. Musculoskeletal: No acute osseous abnormalities are identified. The visualized musculature is unremarkable in appearance. Review of the MIP images confirms the above findings. IMPRESSION: 1. No evidence of pulmonary embolus. 2. Focal airspace opacity at the left lingula and left air base. This is more prominent than in 2018. Given its  appearance, this more likely reflects pneumonia. Followup PA and lateral chest X-ray is recommended in 3-4 weeks following trial of antibiotic therapy to ensure resolution and exclude underlying malignancy. Electronically Signed   By: Garald Balding M.D.   On: 06/11/2017 00:08     Medical Consultants:    None.  Anti-Infectives:   None  Subjective:    Jaylise A Ramdass patient still confused.  Objective:    Vitals:   06/11/17 1854 06/11/17 2122 06/12/17 0012 06/12/17 0546  BP: 121/86 119/87 118/85 115/83  Pulse: (!) 116 (!) 101 98 96  Resp: 18 17 18 17   Temp:  98.8 F (37.1 C) 98.7 F (37.1 C) 98.4 F (36.9 C)  TempSrc:  Oral Oral Oral  SpO2: 98% 99% 98% 99%  Weight:      Height:        Intake/Output Summary (Last 24 hours) at 06/12/2017 0920 Last data filed at 06/12/2017 0432 Gross per 24 hour  Intake 2110 ml  Output 900 ml  Net 1210 ml   Filed Weights   06/10/17 1550 06/11/17 0115  Weight: 64 kg (141 lb) 62.5 kg (137 lb 12.6 oz)    Exam: General exam: In no acute distress. Respiratory system: Good air movement and clear to auscultation. Cardiovascular system: S1 & S2 heard, RRR.  Gastrointestinal system: Abdomen is nondistended, soft and nontender.  Central nervous system: Seems to have no focal deficit on physical exam. Extremities: No pedal edema. Skin: No rashes, lesions or ulcers   Data Reviewed:    Labs: Basic Metabolic  Panel: Recent Labs  Lab 06/10/17 1612 06/10/17 2352  NA 139  --   K 4.7  --   CL 101  --   CO2 27  --   GLUCOSE 175*  --   BUN 11  --   CREATININE 0.58 0.48  CALCIUM 9.4  --    GFR Estimated Creatinine Clearance: 73.2 mL/min (by C-G formula based on SCr of 0.48 mg/dL). Liver Function Tests: Recent Labs  Lab 06/10/17 1612  AST 32  ALT 16  ALKPHOS 39  BILITOT 1.3*  PROT 6.3*  ALBUMIN 3.5   No results for input(s): LIPASE, AMYLASE in the last 168 hours. No results for input(s): AMMONIA in the last 168  hours. Coagulation profile No results for input(s): INR, PROTIME in the last 168 hours.  CBC: Recent Labs  Lab 06/10/17 1612 06/10/17 2352  WBC 15.5* 11.0*  NEUTROABS 14.5*  --   HGB 13.2 12.1  HCT 39.6 36.6  MCV 88.8 87.8  PLT 257 237   Cardiac Enzymes: Recent Labs  Lab 06/11/17 0941  CKTOTAL 19*   BNP (last 3 results) No results for input(s): PROBNP in the last 8760 hours. CBG: Recent Labs  Lab 06/11/17 0740 06/11/17 1114 06/11/17 1744 06/11/17 2124 06/12/17 0742  GLUCAP 183* 178* 216* 217* 237*   D-Dimer: No results for input(s): DDIMER in the last 72 hours. Hgb A1c: Recent Labs    06/11/17 0941  HGBA1C 6.3*   Lipid Profile: No results for input(s): CHOL, HDL, LDLCALC, TRIG, CHOLHDL, LDLDIRECT in the last 72 hours. Thyroid function studies: No results for input(s): TSH, T4TOTAL, T3FREE, THYROIDAB in the last 72 hours.  Invalid input(s): FREET3 Anemia work up: No results for input(s): VITAMINB12, FOLATE, FERRITIN, TIBC, IRON, RETICCTPCT in the last 72 hours. Sepsis Labs: Recent Labs  Lab 06/10/17 1612 06/10/17 2209 06/10/17 2352  WBC 15.5*  --  11.0*  LATICACIDVEN  --  1.48  --    Microbiology Recent Results (from the past 240 hour(s))  MRSA PCR Screening     Status: None   Collection Time: 06/11/17  1:17 AM  Result Value Ref Range Status   MRSA by PCR NEGATIVE NEGATIVE Final    Comment:        The GeneXpert MRSA Assay (FDA approved for NASAL specimens only), is one component of a comprehensive MRSA colonization surveillance program. It is not intended to diagnose MRSA infection nor to guide or monitor treatment for MRSA infections. Performed at [facility], [place] [address], [place], [place] [address]      Medications:   . aspirin EC  81 mg Oral Daily  . dexamethasone  4 mg Intravenous Q6H  . enoxaparin (LOVENOX) injection  40 mg Subcutaneous Daily  . fluticasone  2 spray Each Nare Daily  . insulin aspart  0-9  Units Subcutaneous TID WC  . insulin glargine  5 Units Subcutaneous BID   Continuous Infusions: . sodium chloride 30 mL/hr at 06/12/17 0012  . lacosamide (VIMPAT) IV    . lacosamide (VIMPAT) IV Stopped (06/11/17 2320)  . levETIRAcetam Stopped (06/11/17 2320)      LOS: 2 days   [facility] Pager [phone]  *Please refer to [email or web address], password [email or web address] to get updated schedule on who will round on this patient, as hospitalists switch teams weekly. If 7PM-7AM, please contact night-coverage at [email or web address], password [email or web address] for any overnight needs.  06/12/2017, 9:20 AM

## 2017-06-13 ENCOUNTER — Inpatient Hospital Stay: Payer: Medicaid Other | Admitting: Nurse Practitioner

## 2017-06-13 DIAGNOSIS — R4182 Altered mental status, unspecified: Secondary | ICD-10-CM

## 2017-06-13 LAB — CBC
HEMATOCRIT: 36.8 % (ref 36.0–46.0)
HEMOGLOBIN: 12.5 g/dL (ref 12.0–15.0)
MCH: 29.7 pg (ref 26.0–34.0)
MCHC: 34 g/dL (ref 30.0–36.0)
MCV: 87.4 fL (ref 78.0–100.0)
Platelets: 253 10*3/uL (ref 150–400)
RBC: 4.21 MIL/uL (ref 3.87–5.11)
RDW: 13.8 % (ref 11.5–15.5)
WBC: 12.7 10*3/uL — ABNORMAL HIGH (ref 4.0–10.5)

## 2017-06-13 LAB — COMPREHENSIVE METABOLIC PANEL
ALK PHOS: 52 U/L (ref 38–126)
ALT: 58 U/L — ABNORMAL HIGH (ref 14–54)
ANION GAP: 14 (ref 5–15)
AST: 61 U/L — ABNORMAL HIGH (ref 15–41)
Albumin: 3.1 g/dL — ABNORMAL LOW (ref 3.5–5.0)
BILIRUBIN TOTAL: 0.4 mg/dL (ref 0.3–1.2)
BUN: 11 mg/dL (ref 6–20)
CO2: 21 mmol/L — ABNORMAL LOW (ref 22–32)
Calcium: 9.2 mg/dL (ref 8.9–10.3)
Chloride: 103 mmol/L (ref 101–111)
Creatinine, Ser: 0.54 mg/dL (ref 0.44–1.00)
Glucose, Bld: 287 mg/dL — ABNORMAL HIGH (ref 65–99)
Potassium: 3.4 mmol/L — ABNORMAL LOW (ref 3.5–5.1)
Sodium: 138 mmol/L (ref 135–145)
TOTAL PROTEIN: 6 g/dL — AB (ref 6.5–8.1)

## 2017-06-13 LAB — GLUCOSE, CAPILLARY
GLUCOSE-CAPILLARY: 208 mg/dL — AB (ref 65–99)
GLUCOSE-CAPILLARY: 227 mg/dL — AB (ref 65–99)
Glucose-Capillary: 182 mg/dL — ABNORMAL HIGH (ref 65–99)
Glucose-Capillary: 248 mg/dL — ABNORMAL HIGH (ref 65–99)

## 2017-06-13 NOTE — Progress Notes (Signed)
PROGRESS NOTE  Jaime [name] ZDG:[id number] DOB: 1965/08/22 DOA: 06/10/2017 PCP: Katherine Roan, MD  HPI/Recap of past 24 hours: Jaime [name] is an 52 y.o. 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 Mickeal Skinner recently neuro oncologist presents with worsening visual impairment and confusion, CT scan of the head showed vasogenic edema with mild left shift hydrocephalus since MRI   06/16/2017: Somnolent and not interactive.  Oncology following.  Assessment/Plan: Active Problems:   Diabetes mellitus type 2 with retinopathy ([place])   Essential hypertension   Glioblastoma multiforme of temporal lobe (HCC)   Brain mass   Seizures (HCC)   Acute metabolic encephalopathy  Acute metabolic encephalopathy most likely secondary to glioblastoma multiforme of temporal lobe/seizure disorder Continue Decadron Continue Vimpat Oncology following  Glioblastoma multiforme Oncology following We will consult palliative care team in the morning for goals of care  Possible CAP, POA On IV ceftriaxone and IV azithromycin Afebrile Repeat CBC in the morning  Seizures Continue Vimpat Continue Keppra  Hypertension Blood pressures well controlled Continue home medications  Type 2 diabetes Continue insulin sliding scale   Code Status: DNR  Family Communication: Mother at bedside  Disposition Plan: SNF/home when clinically stable   Consultants: Oncology  Procedures:  None  Antimicrobials:  IV ceftriaxone and IV azithromycin  DVT prophylaxis:   Subcu Lovenox  Objective: Vitals:   06/12/17 1350 06/12/17 2119 06/13/17 0443 06/13/17 1333  BP: 129/90 (!) 147/88 122/88 114/79  Pulse: 97 (!) 105 95 100  Resp: 15 20 (!) 22 20  Temp: 98.9 F (37.2 C) 98.4 F (36.9 C) 98.5 F (36.9 C) (!) 97.5 F (36.4 C)  TempSrc: Oral Oral Oral Oral  SpO2: 100% 97% 100% 100%  Weight:   66.4 kg (146  lb 6.2 oz)   Height:        Intake/Output Summary (Last 24 hours) at 06/13/2017 1833 Last data filed at 06/13/2017 1600 Gross per 24 hour  Intake 1070 ml  Output -  Net 1070 ml   Filed Weights   06/10/17 1550 06/11/17 0115 06/13/17 0443  Weight: 64 kg (141 lb) 62.5 kg (137 lb 12.6 oz) 66.4 kg (146 lb 6.2 oz)    Exam:  . General: 52 y.o. year-old female well developed well nourished in no acute distress.  Somnolent. . Cardiovascular: Regular rate and rhythm with no rubs or gallops.  No thyromegaly or JVD noted.   Marland Kitchen Respiratory: Clear to auscultation with no wheezes or rales. Good inspiratory effort. . Abdomen: Soft nontender nondistended with normal bowel sounds x4 quadrants. . Musculoskeletal: Trace edema lower extremities bilaterally.  Skin: No ulcerative lesions noted or rashes, . Psychiatry: Unable to assess due to somnolence.   Data Reviewed: CBC: Recent Labs  Lab 06/10/17 1612 06/10/17 2352 06/13/17 1036  WBC 15.5* 11.0* 12.7*  NEUTROABS 14.5*  --   --   HGB 13.2 12.1 12.5  HCT 39.6 36.6 36.8  MCV 88.8 87.8 87.4  PLT 257 237 595   Basic Metabolic Panel: Recent Labs  Lab 06/10/17 1612 06/10/17 2352 06/13/17 1036  NA 139  --  138  K 4.7  --  3.4*  CL 101  --  103  CO2 27  --  21*  GLUCOSE 175*  --  287*  BUN 11  --  11  CREATININE 0.58 0.48 0.54  CALCIUM 9.4  --  9.2   GFR: Estimated Creatinine Clearance: 75.2 mL/min (  by C-G formula based on SCr of 0.54 mg/dL). Liver Function Tests: Recent Labs  Lab 06/10/17 1612 06/13/17 1036  AST 32 61*  ALT 16 58*  ALKPHOS 39 52  BILITOT 1.3* 0.4  PROT 6.3* 6.0*  ALBUMIN 3.5 3.1*   No results for input(s): LIPASE, AMYLASE in the last 168 hours. No results for input(s): AMMONIA in the last 168 hours. Coagulation Profile: No results for input(s): INR, PROTIME in the last 168 hours. Cardiac Enzymes: Recent Labs  Lab 06/11/17 0941  CKTOTAL 19*   BNP (last 3 results) No results for input(s): PROBNP in the  last 8760 hours. HbA1C: Recent Labs    06/11/17 0941  HGBA1C 6.3*   CBG: Recent Labs  Lab 06/12/17 1734 06/12/17 2032 06/13/17 0738 06/13/17 1243 06/13/17 1738  GLUCAP 222* 203* 182* 208* 227*   Lipid Profile: No results for input(s): CHOL, HDL, LDLCALC, TRIG, CHOLHDL, LDLDIRECT in the last 72 hours. Thyroid Function Tests: No results for input(s): TSH, T4TOTAL, FREET4, T3FREE, THYROIDAB in the last 72 hours. Anemia Panel: No results for input(s): VITAMINB12, FOLATE, FERRITIN, TIBC, IRON, RETICCTPCT in the last 72 hours. Urine analysis:    Component Value Date/Time   COLORURINE YELLOW 08/20/2016 0827   APPEARANCEUR HAZY (A) 08/20/2016 0827   LABSPEC 1.010 08/20/2016 0827   LABSPEC 1.010 08/09/2016 1502   PHURINE 5.0 08/20/2016 0827   GLUCOSEU 50 (A) 08/20/2016 0827   GLUCOSEU Negative 08/09/2016 1502   HGBUR NEGATIVE 08/20/2016 0827   BILIRUBINUR NEGATIVE 08/20/2016 0827   BILIRUBINUR Negative 08/09/2016 1502   KETONESUR NEGATIVE 08/20/2016 0827   PROTEINUR NEGATIVE 08/20/2016 0827   UROBILINOGEN 0.2 08/09/2016 1502   NITRITE NEGATIVE 08/20/2016 0827   LEUKOCYTESUR MODERATE (A) 08/20/2016 0827   LEUKOCYTESUR Negative 08/09/2016 1502   Sepsis Labs: @LABRCNTIP (procalcitonin:4,lacticidven:4)  ) Recent Results (from the past 240 hour(s))  MRSA PCR Screening     Status: None   Collection Time: 06/11/17  1:17 AM  Result Value Ref Range Status   MRSA by PCR NEGATIVE NEGATIVE Final    Comment:        The GeneXpert MRSA Assay (FDA approved for NASAL specimens only), is one component of a comprehensive MRSA colonization surveillance program. It is not intended to diagnose MRSA infection nor to guide or monitor treatment for MRSA infections. Performed at [facility] [place] [address] [place], [place] [address]       Studies: No results found.  Scheduled Meds: . aspirin EC  81 mg Oral Daily  . dexamethasone  4 mg Intravenous Q6H  .  enoxaparin (LOVENOX) injection  40 mg Subcutaneous Daily  . fluticasone  2 spray Each Nare Daily  . insulin aspart  0-15 Units Subcutaneous TID WC  . insulin aspart  0-5 Units Subcutaneous QHS  . insulin aspart  3 Units Subcutaneous TID WC  . insulin glargine  10 Units Subcutaneous BID    Continuous Infusions: . azithromycin Stopped (06/13/17 1400)  . cefTRIAXone (ROCEPHIN)  IV Stopped (06/13/17 1239)  . lacosamide (VIMPAT) IV Stopped (06/13/17 1111)  . levETIRAcetam Stopped (06/13/17 1018)     LOS: 3 days     Kayleen Memos, MD [facility] Pager [phone]  If 7PM-7AM, please contact night-coverage [email or web address] Password [facility] 06/13/2017, 6:33 PM

## 2017-06-13 NOTE — Care Management Note (Signed)
Case Management Note  Patient Details  Name: Jaime [name] MRN: [id number] Date of Birth: 1965/11/01  Subjective/Objective:     52 yo admitted with Glioblastoma multiforme of temporal lobe.              Action/Plan: From home with mother. PT recommendations gone over with pt and mother at bedside. Mom states that they have used [facility] in the past and would like to use them again. Mom also requesting 3in1. [facility] rep alerted of referral and need for 3in1.  Expected Discharge Date:  06/15/17               Expected Discharge Plan:  [place]  In-House Referral:     Discharge planning Services  CM Consult  Post Acute Care Choice:  Home Health Choice offered to:  Parent  DME Arranged:  3-N-1 DME Agency:  [place] Arranged:  PT, RN [facility] Main Agency:  [place]  Status of Service:  In process, will continue to follow  If discussed at Long Length of Stay Meetings, dates discussed:    Additional CommentsLynnell Catalan, RN 06/13/2017, 10:21 AM  [phone]

## 2017-06-13 NOTE — Progress Notes (Signed)
Physical Therapy Treatment Patient Details Name: Jaime [name] MRN: [id number] DOB: 1965/10/17 Today's Date: 06/13/2017    History of Present Illness Jaime [name] is an 52 y.o. female past medical history of high-grade glioblastoma multiforme , presents with worsening visual impairment and confusion, CT scan of the head showed vasogenic edema with mild left shift hydrocephalus since MRI    PT Comments    Pt progressing with mobility, she ambulated 25' with mod assist for balance with RW.  Pt's mother reports pt requires hands on assist when walking at baseline and has had 8 falls in past year.   Follow Up Recommendations  Home health PT     Equipment Recommendations  None recommended by PT    Recommendations for Other Services       Precautions / Restrictions Precautions Precautions: Fall Precaution Comments: unsteady with walking, 8 falls in past 1 year per pt's mother Restrictions Weight Bearing Restrictions: No    Mobility  Bed Mobility Overal bed mobility: Needs Assistance Bed Mobility: Supine to Sit;Sit to Supine     Supine to sit: Mod assist     General bed mobility comments: self initiates trying to sit up, decreased trunk flexion, assist with legs and trunk to sitting   Transfers Overall transfer level: Needs assistance Equipment used: Rolling walker (2 wheeled) Transfers: Sit to/from Stand Sit to Stand: Mod assist         General transfer comment:  mod assist to rise and steady  Ambulation/Gait Ambulation/Gait assistance: Mod assist Ambulation Distance (Feet): 25 Feet Assistive device: Rolling walker (2 wheeled) Gait Pattern/deviations: Step-through pattern;Narrow base of support;Staggering left;Staggering right Gait velocity: decr   General Gait Details: min to mod A for balance, pt leans posteriorly   Stairs             Wheelchair Mobility    Modified Rankin (Stroke Patients Only)       Balance Overall balance assessment:  Needs assistance Sitting-balance support: Feet supported;Bilateral upper extremity supported Sitting balance-Leahy Scale: Poor   Postural control: Posterior lean Standing balance support: During functional activity;Bilateral upper extremity supported Standing balance-Leahy Scale: Zero                              Cognition Arousal/Alertness: Awake/alert Behavior During Therapy: WFL for tasks assessed/performed;Impulsive Overall Cognitive Status: History of cognitive impairments - at baseline Area of Impairment: Orientation;Awareness;Problem solving                 Orientation Level: Situation           Problem Solving: Slow processing        Exercises      General Comments        Pertinent Vitals/Pain Pain Assessment: No/denies pain Faces Pain Scale: No hurt    Home Living                      Prior Function            PT Goals (current goals can now be found in the care plan section) Acute Rehab PT Goals Patient Stated Goal: plans to return home PT Goal Formulation: With family Time For Goal Achievement: 06/26/17 Potential to Achieve Goals: Fair Progress towards PT goals: Progressing toward goals    Frequency    Min 2X/week      PT Plan Current plan remains appropriate    Co-evaluation  AM-PAC PT "6 Clicks" Daily Activity  Outcome Measure  Difficulty turning over in bed (including adjusting bedclothes, sheets and blankets)?: Unable Difficulty moving from lying on back to sitting on the side of the bed? : Unable Difficulty sitting down on and standing up from a chair with arms (e.g., wheelchair, bedside commode, etc,.)?: Unable Help needed moving to and from a bed to chair (including a wheelchair)?: A Lot Help needed walking in hospital room?: A Lot Help needed climbing 3-5 steps with a railing? 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

## 2017-06-13 NOTE — Progress Notes (Signed)
[facility] Progress Note  Patient Care Team: Katherine Roan, MD as PCP - General  CHIEF COMPLAINTS/PURPOSE OF CONSULTATION:  Altered Mental Status Glioblastoma  INTERVAL HISTORY:  Jaime [name] 52 y.o. female has no new complaints today.  Mom feels episodes of left leg shaking have decreased significantly since starting Vimpat, although it's not clear they have stopped completely.  Jaime [name] is still confused and not at her baseline, per her mother.  Denies fever, cough, shortness of breath, dysuria.    MEDICATIONS:  Current Facility-Administered Medications  Medication Dose Route Frequency Provider Last Rate Last Dose  . acetaminophen (TYLENOL) tablet 650 mg  650 mg Oral Q6H PRN Charlynne Cousins, MD   650 mg at 06/12/17 1505   Or  . acetaminophen (TYLENOL) suppository 650 mg  650 mg Rectal Q6H PRN Charlynne Cousins, MD      . aspirin EC tablet 81 mg  81 mg Oral Daily Charlynne Cousins, MD   81 mg at 06/13/17 1007  . azithromycin (ZITHROMAX) 500 mg in sodium chloride 0.9 % 250 mL IVPB  500 mg Intravenous Q24H Charlynne Cousins, MD   Stopped at 06/13/17 1400  . cefTRIAXone (ROCEPHIN) 1 g in sodium chloride 0.9 % 100 mL IVPB  1 g Intravenous Q24H Charlynne Cousins, MD   Stopped at 06/13/17 1239  . dexamethasone (DECADRON) injection 4 mg  4 mg Intravenous Q6H Charlynne Cousins, MD   4 mg at 06/13/17 1245  . enoxaparin (LOVENOX) injection 40 mg  40 mg Subcutaneous Daily Charlynne Cousins, MD   40 mg at 06/13/17 1002  . fluticasone (FLONASE) 50 MCG/ACT nasal spray 2 spray  2 spray Each Nare Daily Charlynne Cousins, MD   2 spray at 06/13/17 1046  . haloperidol lactate (HALDOL) injection 2 mg  2 mg Intravenous Q6H PRN Charlynne Cousins, MD      . hydrALAZINE (APRESOLINE) injection 10 mg  10 mg Intravenous Q4H PRN Charlynne Cousins, MD      . insulin aspart (novoLOG) injection 0-15 Units  0-15 Units Subcutaneous TID WC Charlynne Cousins, MD   5 Units at 06/13/17 1258  . insulin aspart (novoLOG) injection 0-5 Units  0-5 Units Subcutaneous QHS Charlynne Cousins, MD   2 Units at 06/12/17 2136  . insulin aspart (novoLOG) injection 3 Units  3 Units Subcutaneous TID WC Charlynne Cousins, MD   3 Units at 06/13/17 1258  . insulin glargine (LANTUS) injection 10 Units  10 Units Subcutaneous BID Charlynne Cousins, MD   10 Units at 06/13/17 1001  . lacosamide (VIMPAT) 100 mg in sodium chloride 0.9 % 25 mL IVPB  100 mg Intravenous Q12H Ventura Sellers, MD   Stopped at 06/13/17 1111  . levETIRAcetam (KEPPRA) IVPB 1500 mg/ 100 mL premix  1,500 mg Intravenous BID Charlynne Cousins, MD   Stopped at 06/13/17 1018  . ondansetron (ZOFRAN) tablet 4 mg  4 mg Oral Q6H PRN Charlynne Cousins, MD       Or  . ondansetron [facility]) injection 4 mg  4 mg Intravenous Q6H PRN Charlynne Cousins, MD       Facility-Administered Medications Ordered in Other Encounters  Medication Dose Route Frequency Provider Last Rate Last Dose  . 0.9 %  sodium chloride infusion   Intravenous Continuous Ladell Pier, MD   Stopped at 07/27/16 1630    REVIEW OF SYSTEMS:   Limited by dyshpasia, neglect  PHYSICAL EXAMINATION: Vitals:   06/13/17 0443 06/13/17 1333  BP: 122/88 114/79  Pulse: 95 100  Resp: (!) 22 20  Temp: 98.5 F (36.9 C) (!) 97.5 F (36.4 C)  SpO2: 100% 100%   KPS: 50. General: Alert, cooperative, pleasant, in no acute distress Head: Normal EENT: No conjunctival injection or scleral icterus. Oral mucosa moist Lungs: Resp effort normal Cardiac: Regular rate and rhythm Abdomen: Soft, non-distended abdomen Skin: No rashes cyanosis or petechiae. Extremities: No clubbing or edema  NEUROLOGIC EXAM: Mental Status: Awake, alert, attentive to examiner. Oriented to self but not environment.  Thinks she's at home. Language notable for semantic errors. Comprehension intact to simple commands.  Noted spatial neglect of right  environment and body.    Cranial Nerves: Visual acuity is grossly normal. 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 Extra-ocular movements intact. No ptosis. Face is symmetric, tongue midline. Motor: Tone and bulk are normal. Power is full in both arms and legs. Reflexes are symmetric, no pathologic reflexes present. Intact finger to nose bilaterally Sensory: Intact to light touch and temperature Gait: deferred      LABORATORY DATA:  I have reviewed the data as listed Lab Results  Component Value Date   WBC 12.7 (H) 06/13/2017   HGB 12.5 06/13/2017   HCT 36.8 06/13/2017   MCV 87.4 06/13/2017   PLT 253 06/13/2017   Recent Labs    08/21/16 2035  08/24/16 1455  08/26/16 0426 08/29/16 1019  06/10/17 1612 06/10/17 2352 06/13/17 1036  NA  --    < >  --    < > 140 139  --  139  --  138  K  --    < >  --    < > 3.7 3.2*  --  4.7  --  3.4*  CL  --    < >  --    < > 106  --   --  101  --  103  CO2  --    < >  --    < > 26 26  --  27  --  21*  GLUCOSE  --    < >  --    < > 169* 186*  --  175*  --  287*  BUN  --    < >  --    < > 8 15.5  --  11  --  11  CREATININE  --    < >  --    < > 0.33* 0.7   < > 0.58 0.48 0.54  CALCIUM  --    < >  --    < > 8.6* 9.7  --  9.4  --  9.2  GFRNONAA  --    < >  --    < > >60  --    < > >60 >60 >60  GFRAA  --    < >  --    < > >60  --    < > >60 >60 >60  PROT  --    < > 6.3*  --   --  6.6  --  6.3*  --  6.0*  ALBUMIN  --    < > 3.0*  --   --  3.3*  --  3.5  --  3.1*  AST  --    < > 83*  --   --  23  --  32  --  61*  ALT  --    < >  64*  --   --  54  --  16  --  58*  ALKPHOS  --    < > 76  --   --  87  --  39  --  52  BILITOT 1.7*   < > 1.0  --   --  0.71  --  1.3*  --  0.4  BILIDIR 0.3  --  0.2  --   --   --   --   --   --   --   IBILI 1.4*  --  0.8  --   --   --   --   --   --   --    < > = values in this interval not displayed.    RADIOGRAPHIC STUDIES: I have personally reviewed the radiological images as listed and agreed with the  findings in the report.  Ct Head Wo Contrast  Result Date: 06/10/2017 CLINICAL DATA:  Altered mental status. 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 EXAM: CT HEAD WITHOUT CONTRAST TECHNIQUE: Contiguous axial images were obtained from the base of the skull through the vertex without intravenous contrast. COMPARISON:  Brain MRI 06/01/2017.  Head CT scan 06/18/2016. FINDINGS: Brain: Again seen is a large mass lesion in the left temporal lobe with extensive surrounding vasogenic edema. Discrete measurement of this lesion is difficult on uninfused CT scan but it is not grossly changed since the most recent study. There is associated left-to-right midline shift of approximately 0.7 cm, unchanged. Hydrocephalus also appears unchanged. No hemorrhage is identified. Vascular: No hyperdense vessel or unexpected calcification. Skull: Intact. Sinuses/Orbits: Negative. Other: None. IMPRESSION: No marked change in the patient's left temporal lobe glioblastoma multiforme with associated extensive vasogenic edema, left to right midline shift and hydrocephalus since the patient's brain MRI 06/01/2017. Electronically Signed   By: Inge Rise M.D.   On: 06/10/2017 17:08   Ct Angio Chest Pe W Or Wo Contrast  Result Date: 06/11/2017 CLINICAL DATA:  Acute onset of tachycardia.  Mental status changes. EXAM: CT ANGIOGRAPHY CHEST WITH CONTRAST TECHNIQUE: Multidetector CT imaging of the chest was performed using the standard protocol during bolus administration of intravenous contrast. Multiplanar CT image reconstructions and MIPs were obtained to evaluate the vascular anatomy. CONTRAST:  139mL ISOVUE-370 IOPAMIDOL (ISOVUE-370) INJECTION 76% COMPARISON:  CT of the chest performed 06/20/2016 FINDINGS: Cardiovascular:  There is no evidence of pulmonary embolus. The heart is borderline normal in size. The thoracic aorta is grossly unremarkable. The great vessels are within normal limits. Mediastinum/Nodes: The  mediastinum is unremarkable in appearance. No mediastinal lymphadenopathy is seen. No pericardial effusion is identified. The visualized portions of the thyroid gland are unremarkable. No axillary lymphadenopathy is seen. Lungs/Pleura: Focal airspace opacity is noted at the left lingula and left lung base. Minimal airspace opacity was noted at the left lingula in 2018. Given its appearance, this more likely reflects pneumonia. Would consider follow-up PA and lateral chest radiograph after completion of treatment for pneumonia. Upper Abdomen: The visualized portions of the liver and spleen are unremarkable. The gallbladder is grossly unremarkable in appearance. The visualized portions of the pancreas and adrenal glands are within normal limits. Mild nonspecific perinephric stranding is noted bilaterally. Musculoskeletal: No acute osseous abnormalities are identified. The visualized musculature is unremarkable in appearance. Review of the MIP images confirms the above findings. IMPRESSION: 1. No evidence of pulmonary embolus. 2. Focal airspace opacity at the left lingula and left air base. This is more  prominent than in 2018. Given its appearance, this more likely reflects pneumonia. Followup PA and lateral chest X-ray is recommended in 3-4 weeks following trial of antibiotic therapy to ensure resolution and exclude underlying malignancy. Electronically Signed   By: Garald Balding M.D.   On: 06/11/2017 00:08   Mr Jeri Cos UY Contrast  Result Date: 06/01/2017 CLINICAL DATA:  Glioblastoma multiform left temporal lobe EXAM: MRI HEAD WITHOUT AND WITH CONTRAST TECHNIQUE: Multiplanar, multiecho pulse sequences of the brain and surrounding structures were obtained without and with intravenous contrast. CONTRAST:  39mL MULTIHANCE GADOBENATE DIMEGLUMINE 529 MG/ML IV SOLN COMPARISON:  MRI 04/09/2017, 08/11/2016 FINDINGS: Brain: Marked progression of enhancing tumor in the left medial temporal lobe. Lobular masslike area of  enhancement now measures approximately 5.7 x 3.8 x 4.3 cm. This projects into the left lateral ventricle. This component of the mass shows progressive susceptibility suggesting hemorrhage which has developed since the prior study. Significant progression of white matter hyperintensity throughout the left temporal lobe with extension into the left occipital parietal lobe. Nodular masslike enhancement in the perimesencephalic cistern anteriorly and on the right has progressed compatible with CSF spread of tumor. Small amount of enhancement in the floor of the fourth ventricle may also represent tumor. Progressive hydrocephalus. Progressive midline shift now approximately 7 mm. Progressive periventricular white matter hyperintensity. Some of this is likely due to transependymal resorption of CSF due to hydrocephalus. Negative for acute infarct. Vascular: Normal arterial flow void Skull and upper cervical spine: Negative Sinuses/Orbits: Negative Other: None IMPRESSION: Rapid progression of tumor since the prior study. The main component of the tumor in the left temporal lobe shows significant progression in size in interval hemorrhage. Extensive white matter hyperintensity throughout the left temporal lobe extending into the left occipital parietal lobes also has progressed. Increased mass-effect and 7 mm midline shift to the right. 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 Progressive hydrocephalus These results will be called to the ordering clinician or representative by the Radiologist Assistant, and communication documented in the PACS or zVision Dashboard. Electronically Signed   By: Franchot Gallo M.D.   On: 06/01/2017 17:23    ASSESSMENT & PLAN:  Altered Mental Status Glioblastoma  Ms. Hill has a progressive encephalopathy.  In the absence of clear systemic indication such as clinical infection, this may ultimately be related to tumor progression.   No suspicion for status epilepticus at this time.   Patient was previously on hospice.  We have offered palliative Avastin infusions which could begin as soon as next week.  The mother understands that this treatment is not curative.  She understands that she could not go back on hospice and continue to receive anti-tumor therapy like Avastin.  Recommend evaluation by palliative care.  Will reach out to Wadie Lessen for guidance.  Continue Vimpat and Keppra for now.  Call/page with questions or clinical updates.  The total time spent in the encounter was 25 minutes and more than 50% was on counseling and review of test results     Ventura Sellers, MD 06/13/2017 3:59 PM

## 2017-06-14 DIAGNOSIS — Z515 Encounter for palliative care: Secondary | ICD-10-CM

## 2017-06-14 DIAGNOSIS — C712 Malignant neoplasm of temporal lobe: Principal | ICD-10-CM

## 2017-06-14 LAB — CBC
HCT: 38.4 % (ref 36.0–46.0)
Hemoglobin: 12.9 g/dL (ref 12.0–15.0)
MCH: 29.3 pg (ref 26.0–34.0)
MCHC: 33.6 g/dL (ref 30.0–36.0)
MCV: 87.1 fL (ref 78.0–100.0)
PLATELETS: 237 10*3/uL (ref 150–400)
RBC: 4.41 MIL/uL (ref 3.87–5.11)
RDW: 13.7 % (ref 11.5–15.5)
WBC: 9.9 10*3/uL (ref 4.0–10.5)

## 2017-06-14 LAB — GLUCOSE, CAPILLARY
GLUCOSE-CAPILLARY: 169 mg/dL — AB (ref 65–99)
Glucose-Capillary: 193 mg/dL — ABNORMAL HIGH (ref 65–99)
Glucose-Capillary: 189 mg/dL — ABNORMAL HIGH (ref 65–99)
Glucose-Capillary: 220 mg/dL — ABNORMAL HIGH (ref 65–99)

## 2017-06-14 LAB — PROCALCITONIN: Procalcitonin: <0.1 ng/mL

## 2017-06-14 MED ORDER — LEVETIRACETAM 500 MG PO TABS
1500.0000 mg | ORAL_TABLET | Freq: Two times a day (BID) | ORAL | Status: DC
  Administered 2017-06-14 – 2017-06-23 (×18): 1500 mg via ORAL
  Filled 2017-06-14 (×17): qty 3

## 2017-06-14 MED ORDER — LACOSAMIDE 50 MG PO TABS
100.0000 mg | ORAL_TABLET | Freq: Two times a day (BID) | ORAL | Status: DC
  Administered 2017-06-14 – 2017-06-23 (×18): 100 mg via ORAL
  Filled 2017-06-14 (×18): qty 2

## 2017-06-14 MED ORDER — AZITHROMYCIN 250 MG PO TABS
500.0000 mg | ORAL_TABLET | Freq: Every day | ORAL | Status: AC
  Administered 2017-06-14 – 2017-06-15 (×2): 500 mg via ORAL
  Filled 2017-06-14 (×2): qty 2

## 2017-06-14 NOTE — Consult Note (Signed)
Consultation Note Date: 06/14/2017   Patient Name: Jaime [name]  DOB: 1966-01-15  MRN: [id number]  Age / Sex: 52 y.o., female  PCP: Katherine Roan, MD Referring Physician: Kayleen Memos, DO  Reason for Consultation: Establishing goals of care and Psychosocial/spiritual support  HPI/Patient Profile: 52 y.o. female   admitted on 06/10/2017 with PMH significant for   glioblastoma multiforme  patient admitted with  worsening symptoms;  frequent falls and  visual impairment, confusion.  Currently anticipates initiation of Avastin/Pallaitive chemotherapy with oncologist/ Dr. Mickeal Skinner.   CT of the head shows left temporal glioblastoma with changes comparable to the recent MRI brain.    Patient has been under hospice benefit in the past.    Family face treatment option decisions, advanced directive decisions and anticipatory care needs  in the setting of terminal disease and limited prognosis.  Clinical Assessment and Goals of Care:  This NP Wadie Lessen reviewed medical records, received report from team, assessed the patient and then meet at the patient's bedside along with mother  to discuss diagnosis, prognosis, GOC, EOL wishes disposition and options.  Concept of Hospice and Palliative Care were discussed.  I meet this patient and family one year ago for similar discussion.  A detailed discussion was had today regarding advanced directives.  Concepts specific to code status, artifical feeding and hydration, continued IV antibiotics and rehospitalization was had.  The difference between a aggressive medical intervention path  and a palliative comfort care path for this patient at this time was had.  Values and goals of care important to patient and family were attempted to be elicited.  Questions and concerns addressed.   Family encouraged to call with questions or concerns.    Mother is the main  decision maker with the support of entire family   SUMMARY OF RECOMMENDATIONS    Code Status/Advance Care Planning:  DNR    Palliative Prophylaxis:   Aspiration, Bowel Regimen, Delirium Protocol, Frequent Pain Assessment and Oral Care  Additional Recommendations (Limitations, Scope, Preferences):  Definitive decision for Avastin/Pallaitve at this time.  Psycho-social/Spiritual:   Desire for further Chaplaincy support:yes  Additional Recommendations: Education on Hospice  Prognosis:   Unable to determine  Discharge Planning:  Transition to SNF for nursing care while patient continues with chemotherapy-convert to hospice when eligible.   [place] for rehab with Palliative care service follow-up       Primary Diagnoses: Present on Admission: . Glioblastoma multiforme of temporal lobe ([place]) . Diabetes mellitus type 2 with retinopathy ([place]) . Brain mass   I have reviewed the medical record, interviewed the patient and family, and examined the patient. The following aspects are pertinent.  Past Medical History:  Diagnosis Date  . Candida, oral 06/22/2016   Due to steroids 06/22/16  . Diabetes mellitus   . DUB (dysfunctional uterine bleeding)    total abdominal hysterectomy in 2007, ovaries intact  . Furuncle of multiple sites    multiple I and D's  . GERD (gastroesophageal reflux disease)   .  Hydradenitis   . Intellectual disability   . 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 Marital status: Single    Spouse name: Not on file  . Number of children: Not on file  . Years of education: Not on file  . Highest education level: Not on file  Occupational History  . Not on file  Social Needs  . Financial resource strain: Not on file  . Food insecurity:    Worry: Not on file    Inability: Not on file  . Transportation needs:    Medical: Not on file    Non-medical: Not on file  Tobacco  Use  . Smoking status: Never Smoker  . Smokeless tobacco: Never Used  Substance and Sexual Activity  . Alcohol use: No    Alcohol/week: 0.0 oz  . Drug use: No  . Sexual activity: Not Currently    Birth control/protection: Surgical  Lifestyle  . Physical activity:    Days per week: Not on file    Minutes per session: Not on file  . Stress: Not on file  Relationships  . Social connections:    Talks on phone: Not on file    Gets together: Not on file    Attends religious service: Not on file    Active member of club or organization: Not on file    Attends meetings of clubs or organizations: Not on file    Relationship status: Not on file  Other Topics Concern  . 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   No kids.   No sexual activity   Lives with her mother   Family History  Problem Relation Age of Onset  . Cancer Mother 9       Breast cancer  . Hypertension Mother   . Depression Mother   . Sickle cell trait Mother   . Cancer Father 31       lung cancer, was a long term smoker   Scheduled Meds: . aspirin EC  81 mg Oral Daily  . azithromycin  500 mg Oral Daily  . dexamethasone  4 mg Intravenous Q6H  . enoxaparin (LOVENOX) injection  40 mg Subcutaneous Daily  . fluticasone  2 spray Each Nare Daily  . insulin aspart  0-15 Units Subcutaneous TID WC  . insulin aspart  0-5 Units Subcutaneous QHS  . insulin aspart  3 Units Subcutaneous TID WC  . insulin glargine  10 Units Subcutaneous BID  . lacosamide  100 mg Oral BID  . levETIRAcetam  1,500 mg Oral BID   Continuous Infusions: . cefTRIAXone (ROCEPHIN)  IV Stopped (06/14/17 1131)   PRN Meds:.acetaminophen **OR** acetaminophen, haloperidol lactate, hydrALAZINE, ondansetron **OR** ondansetron (ZOFRAN) IV Medications Prior to Admission:  Prior to Admission medications   Medication Sig Start Date End  Date Taking? Authorizing Provider  acetaminophen (TYLENOL) 500 MG tablet Take 1 tablet (500 mg total) by mouth every 6 (six) hours as needed for moderate pain. 02/26/17  Yes Hoffman, Jessica Ratliff, DO  aspirin EC 81 MG tablet Take 81 mg by mouth daily.   Yes [provider]  dexamethasone (DECADRON) 4 MG tablet Take 2 tablets (8 mg total) by mouth 2 (two) times daily with a meal. 05/28/17  Yes Ladell Pier, MD  famotidine (PEPCID) 20 MG tablet Take 1 tablet (20 mg total)  by mouth 2 (two) times daily. 07/03/16  Yes Minus Liberty, MD  fluticasone [facility]) 50 MCG/ACT nasal spray Place 2 sprays into both nostrils daily. 06/08/16  Yes Barnet Glasgow, NP  insulin aspart (NOVOLOG FLEXPEN) 100 UNIT/ML FlexPen Inject 3-5 Units into the skin 3 (three) times daily with meals. 04/09/17 10/06/17 Yes Katherine Roan, MD  insulin glargine (LANTUS) 100 unit/mL SOPN Inject 0.17 mLs (17 Units total) into the skin at bedtime. 04/09/17  Yes Katherine Roan, MD  levETIRAcetam (KEPPRA) 500 MG tablet Take 3 tablets (1,500 mg total) by mouth 2 (two) times daily. Patient taking differently: Take 1,500 mg by mouth 3 (three) times daily.  06/05/17  Yes Vaslow, Acey Lav, MD  lisinopril (PRINIVIL,ZESTRIL) 40 MG tablet Take 40 mg by mouth daily.   Yes [provider]  loratadine (CLARITIN) 10 MG tablet Take 1 tablet (10 mg total) by mouth daily. 07/03/16  Yes Minus Liberty, MD  metFORMIN (GLUCOPHAGE) 1000 MG tablet Take 1 tablet (1,000 mg total) by mouth 2 (two) times daily with a meal. 02/20/17 11/17/17 Yes Winfrey, Jenne Pane, MD  montelukast (SINGULAIR) 10 MG tablet Take 1 tablet (10 mg total) by mouth at bedtime. 01/10/17  Yes Ladell Pier, MD  ondansetron (ZOFRAN ODT) 4 MG disintegrating tablet Take 1 tablet (4 mg total) by mouth every 8 (eight) hours as needed for nausea or vomiting. 07/24/16  Yes Ladell Pier, MD  potassium chloride SA (K-DUR,KLOR-CON) 20 MEQ tablet TAKE 1 TABLET BY  MOUTH DAILY 05/10/17  Yes Ladell Pier, MD  ACCU-CHEK SOFTCLIX LANCETS lancets USE TO CHECK BLOOD SUGAR FOUR TIMES DAILY 07/11/16   Minus Liberty, MD  blood glucose meter kit and supplies Dispense based on patient and insurance preference. Use up to four times daily as directed. (FOR ICD-9 250.00, 250.01). 06/21/16   Holley Raring, MD  glucose blood (ACCU-CHEK AVIVA PLUS) test strip USE TO TEST BLOOD SUGAR FOUR TIMES DAILY Diagnosis code:E11.319 02/13/17   Katherine Roan, MD  Insulin Pen Needle 31G X 5 MM MISC Use for injections under the skin as directed. Use 4 times daily. diag code E11.319 insulin dependent 02/13/17   Katherine Roan, MD  LORazepam (ATIVAN) 0.5 MG tablet 1 tablet po 30 minutes prior to radiation or MRI Patient not taking: Reported on 06/05/2017 06/21/16   Hayden Pedro, PA-C  Nutritional Supplements (FEEDING SUPPLEMENT, BOOST BREEZE,) LIQD Take 1 Bottle by mouth 3 (three) times daily.  08/17/16   [provider]   No Known Allergies Review of Systems  Unable to perform ROS: Acuity of condition    Physical Exam  Constitutional: She appears well-developed.  Cardiovascular: Normal rate, regular rhythm and normal heart sounds.  Pulmonary/Chest: Effort normal and breath sounds normal.    Vital Signs: BP (!) 123/93 (BP Location: Left Arm)   Pulse 90   Temp 98.3 F (36.8 C) (Oral)   Resp 15   Ht 5' 2"  (1.575 m)   Wt 66.4 kg (146 lb 6.2 oz)   LMP 10/31/2005   SpO2 100%   BMI 26.77 kg/m  Pain Scale: 0-10 POSS *See Group Information*: S-Acceptable,Sleep, easy to arouse Pain Score: 0-No pain   SpO2: SpO2: 100 % O2 Device:SpO2: 100 % O2 Flow Rate: .   IO: Intake/output summary:   Intake/Output Summary (Last 24 hours) at 06/14/2017 1457 Last data filed at 06/14/2017 1333 Gross per 24 hour  Intake 1085 ml  Output 920 ml  Net 165 ml  LBM: Last BM Date: 06/11/17 Baseline Weight: Weight: 64 kg (141 lb) Most recent weight: Weight: 66.4  kg (146 lb 6.2 oz)     Palliative Assessment/Data:  30%   Discussed with Internal Medicine and CMRN and SW  Time In: 1300 Time Out: 1410 Time Total: 70 minutes Greater than 50%  of this time was spent counseling and coordinating care related to the above assessment and plan.  Signed by: Wadie Lessen, NP   Please contact Palliative Medicine Team phone at [phone] for questions and concerns.  For individual provider: See Shea Evans

## 2017-06-14 NOTE — Progress Notes (Signed)
PROGRESS NOTE  Jaime [name] KNL:[id number] DOB: [birth year] DOA: 06/10/2017 PCP: Jaime Roan, MD  HPI/Recap of past 24 hours: Jaime [name] is an 52 y.o. 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 Mickeal Skinner recently neuro oncologist presents with worsening visual impairment and confusion, CT scan of the head showed vasogenic edema with mild left shift hydrocephalus since MRI   06/13/2017: Somnolent and not interactive.  Oncology following.  06/14/17: More alert and oriented to self. No new complaints.  Assessment/Plan: Active Problems:   Diabetes mellitus type 2 with retinopathy ([place])   Essential hypertension   Glioblastoma multiforme of temporal lobe (HCC)   Brain mass   Seizures (HCC)   Acute metabolic encephalopathy   Altered mental status  Acute metabolic encephalopathy, improving Most likely secondary to glioblastoma multiforme of temporal lobe/seizure disorder Continue Decadron Continue Vimpat Oncology following  Glioblastoma multiforme Palliative consult for goals of care and symptoms management Oncology following  Possible CAP, POA On IV ceftriaxone and IV azithromycin Afebrile procalcitonin less than 0.10 Stop IV antibiotics  Seizures, stable Continue Vimpat Continue Keppra  HTN, stable Blood pressures well controlled Continue home medications  DM2, stable Continue insulin sliding scale  Physical debility PT recommends HHPT   Code Status: DNR  Family Communication: Mother at bedside  Disposition Plan: SNF/home when clinically stable   Consultants: Oncology  Procedures:  None  Antimicrobials:  none  DVT prophylaxis:   Subcu Lovenox  Objective: Vitals:   06/13/17 1333 06/13/17 2024 06/14/17 0515 06/14/17 1327  BP: 114/79 (!) 149/90 130/90 (!) 123/93  Pulse: 100 97 86 90  Resp: 20 16 17 15   Temp: (!) 97.5 F (36.4 C) 98.4 F  (36.9 C) 98 F (36.7 C) 98.3 F (36.8 C)  TempSrc: Oral Oral Oral Oral  SpO2: 100% 100% 100% 100%  Weight:      Height:        Intake/Output Summary (Last 24 hours) at 06/14/2017 1721 Last data filed at 06/14/2017 1333 Gross per 24 hour  Intake 600 ml  Output 920 ml  Net -320 ml   Filed Weights   06/10/17 1550 06/11/17 0115 06/13/17 0443  Weight: 64 kg (141 lb) 62.5 kg (137 lb 12.6 oz) 66.4 kg (146 lb 6.2 oz)    Exam: 06/14/17  . General: 52 y.o. year-old female WD WN Alert and oriented x 1 . Cardiovascular: RRR no rubs or gallops. No JVD or thyromegaly. Marland Kitchen Respiratory: Clear to auscultation with no wheezes or rales. Good inspiratory effort. . Abdomen: Soft nontender nondistended with normal bowel sounds x4 quadrants. . Musculoskeletal: Trace edema lower extremities bilaterally.  Skin: No ulcerative lesions noted or rashes, . Psychiatry: Mood is appropriate for condition and setting.   Data Reviewed: CBC: Recent Labs  Lab 06/10/17 1612 06/10/17 2352 06/13/17 1036 06/14/17 1048  WBC 15.5* 11.0* 12.7* 9.9  NEUTROABS 14.5*  --   --   --   HGB 13.2 12.1 12.5 12.9  HCT 39.6 36.6 36.8 38.4  MCV 88.8 87.8 87.4 87.1  PLT 257 237 253 790   Basic Metabolic Panel: Recent Labs  Lab 06/10/17 1612 06/10/17 2352 06/13/17 1036  NA 139  --  138  K 4.7  --  3.4*  CL 101  --  103  CO2 27  --  21*  GLUCOSE 175*  --  287*  BUN 11  --  11  CREATININE 0.58 0.48 0.54  CALCIUM 9.4  --  9.2   GFR: Estimated Creatinine Clearance: 75.2 mL/min (by C-G formula based on SCr of 0.54 mg/dL). Liver Function Tests: Recent Labs  Lab 06/10/17 1612 06/13/17 1036  AST 32 61*  ALT 16 58*  ALKPHOS 39 52  BILITOT 1.3* 0.4  PROT 6.3* 6.0*  ALBUMIN 3.5 3.1*   No results for input(s): LIPASE, AMYLASE in the last 168 hours. No results for input(s): AMMONIA in the last 168 hours. Coagulation Profile: No results for input(s): INR, PROTIME in the last 168 hours. Cardiac Enzymes: Recent  Labs  Lab 06/11/17 0941  CKTOTAL 19*   BNP (last 3 results) No results for input(s): PROBNP in the last 8760 hours. HbA1C: No results for input(s): HGBA1C in the last 72 hours. CBG: Recent Labs  Lab 06/13/17 1243 06/13/17 1738 06/13/17 2054 06/14/17 0728 06/14/17 1139  GLUCAP 208* 227* 248* 169* 193*   Lipid Profile: No results for input(s): CHOL, HDL, LDLCALC, TRIG, CHOLHDL, LDLDIRECT in the last 72 hours. Thyroid Function Tests: No results for input(s): TSH, T4TOTAL, FREET4, T3FREE, THYROIDAB in the last 72 hours. Anemia Panel: No results for input(s): VITAMINB12, FOLATE, FERRITIN, TIBC, IRON, RETICCTPCT in the last 72 hours. Urine analysis:    Component Value Date/Time   COLORURINE YELLOW 08/20/2016 0827   APPEARANCEUR HAZY (A) 08/20/2016 0827   LABSPEC 1.010 08/20/2016 0827   LABSPEC 1.010 08/09/2016 1502   PHURINE 5.0 08/20/2016 0827   GLUCOSEU 50 (A) 08/20/2016 0827   GLUCOSEU Negative 08/09/2016 1502   HGBUR NEGATIVE 08/20/2016 0827   BILIRUBINUR NEGATIVE 08/20/2016 0827   BILIRUBINUR Negative 08/09/2016 1502   KETONESUR NEGATIVE 08/20/2016 0827   PROTEINUR NEGATIVE 08/20/2016 0827   UROBILINOGEN 0.2 08/09/2016 1502   NITRITE NEGATIVE 08/20/2016 0827   LEUKOCYTESUR MODERATE (A) 08/20/2016 0827   LEUKOCYTESUR Negative 08/09/2016 1502   Sepsis Labs: @LABRCNTIP (procalcitonin:4,lacticidven:4)  ) Recent Results (from the past 240 hour(s))  MRSA PCR Screening     Status: None   Collection Time: 06/11/17  1:17 AM  Result Value Ref Range Status   MRSA by PCR NEGATIVE NEGATIVE Final    Comment:        The GeneXpert MRSA Assay (FDA approved for NASAL specimens only), is one component of a comprehensive MRSA colonization surveillance program. It is not intended to diagnose MRSA infection nor to guide or monitor treatment for MRSA infections. Performed at [facility], [place] [address], [place], [place] [address]       Studies: No  results found.  Scheduled Meds: . aspirin EC  81 mg Oral Daily  . azithromycin  500 mg Oral Daily  . dexamethasone  4 mg Intravenous Q6H  . enoxaparin (LOVENOX) injection  40 mg Subcutaneous Daily  . fluticasone  2 spray Each Nare Daily  . insulin aspart  0-15 Units Subcutaneous TID WC  . insulin aspart  0-5 Units Subcutaneous QHS  . insulin aspart  3 Units Subcutaneous TID WC  . insulin glargine  10 Units Subcutaneous BID  . lacosamide  100 mg Oral BID  . levETIRAcetam  1,500 mg Oral BID    Continuous Infusions: . cefTRIAXone (ROCEPHIN)  IV Stopped (06/14/17 1131)     LOS: 4 days     Kayleen Memos, MD [facility] Pager [phone]  If 7PM-7AM, please contact night-coverage [email or web address] Password [email or web address] 06/14/2017, 5:21 PM     PROGRESS NOTE  JAYDEEN DARLEY TDD:[id number] DOB: [birth year] DOA: 06/10/2017 PCP: Jaime Roan, MD  HPI/Recap of  past 24 hours: Jaime [name] is an 52 y.o. 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 Mickeal Skinner recently neuro oncologist presents with worsening visual impairment and confusion, CT scan of the head showed vasogenic edema with mild left shift hydrocephalus since MRI   06/16/2017: Somnolent and not interactive.  Oncology following.  Assessment/Plan: Active Problems:   Diabetes mellitus type 2 with retinopathy ([place])   Essential hypertension   Glioblastoma multiforme of temporal lobe (HCC)   Brain mass   Seizures (HCC)   Acute metabolic encephalopathy   Altered mental status  Acute metabolic encephalopathy most likely secondary to glioblastoma multiforme of temporal lobe/seizure disorder Continue Decadron Continue Vimpat Oncology following  Glioblastoma multiforme Oncology following We will consult palliative care team in the morning for goals of care  Possible CAP, POA On IV ceftriaxone and IV  azithromycin Afebrile Repeat CBC in the morning  Seizures Continue Vimpat Continue Keppra  Hypertension Blood pressures well controlled Continue home medications  Type 2 diabetes Continue insulin sliding scale   Code Status: DNR  Family Communication: Mother at bedside  Disposition Plan: SNF/home when clinically stable   Consultants: Oncology  Procedures:  None  Antimicrobials:  IV ceftriaxone and IV azithromycin  DVT prophylaxis:   Subcu Lovenox  Objective: Vitals:   06/13/17 1333 06/13/17 2024 06/14/17 0515 06/14/17 1327  BP: 114/79 (!) 149/90 130/90 (!) 123/93  Pulse: 100 97 86 90  Resp: 20 16 17 15   Temp: (!) 97.5 F (36.4 C) 98.4 F (36.9 C) 98 F (36.7 C) 98.3 F (36.8 C)  TempSrc: Oral Oral Oral Oral  SpO2: 100% 100% 100% 100%  Weight:      Height:        Intake/Output Summary (Last 24 hours) at 06/14/2017 1721 Last data filed at 06/14/2017 1333 Gross per 24 hour  Intake 600 ml  Output 920 ml  Net -320 ml   Filed Weights   06/10/17 1550 06/11/17 0115 06/13/17 0443  Weight: 64 kg (141 lb) 62.5 kg (137 lb 12.6 oz) 66.4 kg (146 lb 6.2 oz)    Exam:  . General: 52 y.o. year-old female well developed well nourished in no acute distress.  Somnolent. . Cardiovascular: Regular rate and rhythm with no rubs or gallops.  No thyromegaly or JVD noted.   Marland Kitchen Respiratory: Clear to auscultation with no wheezes or rales. Good inspiratory effort. . Abdomen: Soft nontender nondistended with normal bowel sounds x4 quadrants. . Musculoskeletal: Trace edema lower extremities bilaterally.  Skin: No ulcerative lesions noted or rashes, . Psychiatry: Unable to assess due to somnolence.   Data Reviewed: CBC: Recent Labs  Lab 06/10/17 1612 06/10/17 2352 06/13/17 1036 06/14/17 1048  WBC 15.5* 11.0* 12.7* 9.9  NEUTROABS 14.5*  --   --   --   HGB 13.2 12.1 12.5 12.9  HCT 39.6 36.6 36.8 38.4  MCV 88.8 87.8 87.4 87.1  PLT 257 237 253 790   Basic Metabolic  Panel: Recent Labs  Lab 06/10/17 1612 06/10/17 2352 06/13/17 1036  NA 139  --  138  K 4.7  --  3.4*  CL 101  --  103  CO2 27  --  21*  GLUCOSE 175*  --  287*  BUN 11  --  11  CREATININE 0.58 0.48 0.54  CALCIUM 9.4  --  9.2   GFR: Estimated Creatinine Clearance: 75.2 mL/min (by C-G formula based on SCr of 0.54 mg/dL).  Liver Function Tests: Recent Labs  Lab 06/10/17 1612 06/13/17 1036  AST 32 61*  ALT 16 58*  ALKPHOS 39 52  BILITOT 1.3* 0.4  PROT 6.3* 6.0*  ALBUMIN 3.5 3.1*   No results for input(s): LIPASE, AMYLASE in the last 168 hours. No results for input(s): AMMONIA in the last 168 hours. Coagulation Profile: No results for input(s): INR, PROTIME in the last 168 hours. Cardiac Enzymes: Recent Labs  Lab 06/11/17 0941  CKTOTAL 19*   BNP (last 3 results) No results for input(s): PROBNP in the last 8760 hours. HbA1C: No results for input(s): HGBA1C in the last 72 hours. CBG: Recent Labs  Lab 06/13/17 1243 06/13/17 1738 06/13/17 2054 06/14/17 0728 06/14/17 1139  GLUCAP 208* 227* 248* 169* 193*   Lipid Profile: No results for input(s): CHOL, HDL, LDLCALC, TRIG, CHOLHDL, LDLDIRECT in the last 72 hours. Thyroid Function Tests: No results for input(s): TSH, T4TOTAL, FREET4, T3FREE, THYROIDAB in the last 72 hours. Anemia Panel: No results for input(s): VITAMINB12, FOLATE, FERRITIN, TIBC, IRON, RETICCTPCT in the last 72 hours. Urine analysis:    Component Value Date/Time   COLORURINE YELLOW 08/20/2016 0827   APPEARANCEUR HAZY (A) 08/20/2016 0827   LABSPEC 1.010 08/20/2016 0827   LABSPEC 1.010 08/09/2016 1502   PHURINE 5.0 08/20/2016 0827   GLUCOSEU 50 (A) 08/20/2016 0827   GLUCOSEU Negative 08/09/2016 1502   HGBUR NEGATIVE 08/20/2016 0827   BILIRUBINUR NEGATIVE 08/20/2016 0827   BILIRUBINUR Negative 08/09/2016 1502   KETONESUR NEGATIVE 08/20/2016 0827   PROTEINUR NEGATIVE 08/20/2016 0827   UROBILINOGEN 0.2 08/09/2016 1502   NITRITE NEGATIVE  08/20/2016 0827   LEUKOCYTESUR MODERATE (A) 08/20/2016 0827   LEUKOCYTESUR Negative 08/09/2016 1502   Sepsis Labs: @LABRCNTIP (procalcitonin:4,lacticidven:4)  ) Recent Results (from the past 240 hour(s))  MRSA PCR Screening     Status: None   Collection Time: 06/11/17  1:17 AM  Result Value Ref Range Status   MRSA by PCR NEGATIVE NEGATIVE Final    Comment:        The GeneXpert MRSA Assay (FDA approved for NASAL specimens only), is one component of a comprehensive MRSA colonization surveillance program. It is not intended to diagnose MRSA infection nor to guide or monitor treatment for MRSA infections. Performed at [facility], [place] [address] [place],  [address]       Studies: No results found.  Scheduled Meds: . aspirin EC  81 mg Oral Daily  . azithromycin  500 mg Oral Daily  . dexamethasone  4 mg Intravenous Q6H  . enoxaparin (LOVENOX) injection  40 mg Subcutaneous Daily  . fluticasone  2 spray Each Nare Daily  . insulin aspart  0-15 Units Subcutaneous TID WC  . insulin aspart  0-5 Units Subcutaneous QHS  . insulin aspart  3 Units Subcutaneous TID WC  . insulin glargine  10 Units Subcutaneous BID  . lacosamide  100 mg Oral BID  . levETIRAcetam  1,500 mg Oral BID    Continuous Infusions: . cefTRIAXone (ROCEPHIN)  IV Stopped (06/14/17 1131)     LOS: 4 days     Kayleen Memos, MD [facility] Pager [phone]  If 7PM-7AM, please contact night-coverage [email or web address] Password [facility] 06/14/2017, 5:21 PM

## 2017-06-14 NOTE — Progress Notes (Addendum)
CSW met with pt, pt's mother (primary caretaker), along with CM to discuss disposition planning.  Pt requires total care at home- has I/DD diagnosis (mother reports she does not have psychological paperwork and does not know IQ score "but it is very low. She was diagnoses around 52 years old.") Reports pt is able to communicate her basic need; requires assistance with all ADLs and with ambulating. Pt is sees at [facility] for treatment of glioblastoma. Palliative team also working with pt/family re: goals of care- currently pt's mother wants to continue avastin treatments. Wants palliative to follow outpatient once pt discharged. Pt's mother has been planning on having pt return home at [place] (with home health services). However, she reports she has been considering having pt placed in facility due to her high level of care needs. Has not begun placement process but reports family has had other members in [facility] SNF in the past and they would like pt to admit there as well if possible. CSW explained the placement process for long term care at SNF [facility] requires coverage switch to facility Medicaid, and that pt's income check be signed over to facility upon move-in). Also explained that long term care placement from the hospital is not guaranteed. Mother reports understanding and if placement not located by the time pt stable to DC from hospital, she would plan to have pt return home and continue working on placement options from home.   CSW submitted for [facility]- went to [facility] assignment meaning PAS[facility] representative will need to visit pt for assessment prior to making a decision to grant facility [place]. [facility] has CSW's contact information to schedule visit once representative is assigned.  CSW noted that FYI in pt's chart indicates she has a legal guardian but does not identify individual- no guardianship paperwork on file. Pt's mother explained she is not pt's legal guardian- pt is own legal guardian- but that  mother is Economist and Media planner. HCPOA paperwork is found in epic chart.   Completed FL2 and made referrals to area SNFs including family preference [place].  Sharren Bridge, MSW, LCSW Clinical Social Work 06/14/2017 [phone]

## 2017-06-14 NOTE — [place] FL2 (Addendum)
MEDICAID FL2 LEVEL OF CARE SCREENING TOOL     IDENTIFICATION  Patient Name: Jaime [name] Birthdate: 02-Apr-1965 Sex: female Admission Date (Current Location): 06/10/2017  [facility] and [place] Number:  Herbalist and Address:  [facility],  [place], [place]      Provider Number: [id number]  Attending Physician Name and Address:  Kayleen Memos, DO  Relative Name and Phone Number:       Current Level of Care: Hospital Recommended Level of Care: [place] Prior Approval Number:    Date Approved/Denied:   PASRR Number: pending  Discharge Plan: SNF    Current Diagnoses: Patient Active Problem List   Diagnosis Date Noted  . Altered mental status   . Acute metabolic encephalopathy 83/66/2947  . Brain mass 06/10/2017  . Seizures ([place]) 06/10/2017  . Healthcare maintenance 02/26/2017  . Leg pain, anterior, left 01/18/2017  . Protein-calorie malnutrition, severe 08/21/2016  . Symptomatic anemia 08/20/2016  . Dehydration 08/20/2016  . Acute prerenal azotemia 08/20/2016  . SIRS (systemic inflammatory response syndrome) ([place]) 08/20/2016  . Glioblastoma multiforme of temporal lobe ([place]) 07/11/2016  . Furuncle of labia majora 06/29/2016  . 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 Palliative care by specialist   . HLD (hyperlipidemia) 06/18/2016  . GERD (gastroesophageal reflux disease) 06/18/2016  . Brain tumor ([place])   . Nonproliferative diabetic retinopathy ([place]) 12/17/2015  . Onychomycosis of left great toe 01/13/2014  . DNR (do not resuscitate) discussion 09/23/2013  . Essential hypertension 06/11/2013  . Diabetes mellitus type 2 with retinopathy ([place]) 11/30/2009  . Obesity 09/28/2009  . SICKLE-CELL TRAIT 09/28/2009    Orientation RESPIRATION BLADDER Height & Weight     Self, Place  Normal Incontinent Weight: 146 lb 6.2 oz (66.4 kg) Height:  5\' 2"  (157.5 cm)  BEHAVIORAL SYMPTOMS/MOOD  NEUROLOGICAL BOWEL NUTRITION STATUS      Continent Diet(carb modified diet)  AMBULATORY STATUS COMMUNICATION OF NEEDS Skin   Total Care Verbally Normal                       Personal Care Assistance Level of Assistance  Bathing, Feeding, Dressing Bathing Assistance: Maximum assistance Feeding assistance: Maximum assistance Dressing Assistance: Maximum assistance     Functional Limitations Info  Sight, Hearing, Speech Sight Info: Adequate Hearing Info: Adequate Speech Info: Adequate    SPECIAL CARE FACTORS FREQUENCY                       Contractures Contractures Info: Not present    Additional Factors Info  Code Status, Allergies Code Status Info: DNR Allergies Info: nka           Current Medications (06/14/2017):  This is the current hospital active medication list Current Facility-Administered Medications  Medication Dose Route Frequency Provider Last Rate Last Dose  . acetaminophen (TYLENOL) tablet 650 mg  650 mg Oral Q6H PRN Charlynne Cousins, MD   650 mg at 06/12/17 1505   Or  . acetaminophen (TYLENOL) suppository 650 mg  650 mg Rectal Q6H PRN Charlynne Cousins, MD      . aspirin EC tablet 81 mg  81 mg Oral Daily Charlynne Cousins, MD   81 mg at 06/14/17 0945  . azithromycin (ZITHROMAX) tablet 500 mg  500 mg Oral Daily Hall, Carole N, DO      . cefTRIAXone (ROCEPHIN) 1 g in sodium chloride 0.9 % 100 mL  IVPB  1 g Intravenous Q24H Irene Pap N, DO 200 mL/hr at 06/14/17 1101 1 g at 06/14/17 1101  . dexamethasone (DECADRON) injection 4 mg  4 mg Intravenous Q6H Charlynne Cousins, MD   4 mg at 06/14/17 1135  . enoxaparin (LOVENOX) injection 40 mg  40 mg Subcutaneous Daily Charlynne Cousins, MD   40 mg at 06/14/17 0945  . fluticasone (FLONASE) 50 MCG/ACT nasal spray 2 spray  2 spray Each Nare Daily Charlynne Cousins, MD   2 spray at 06/13/17 1046  . haloperidol lactate (HALDOL) injection 2 mg  2 mg Intravenous Q6H PRN Charlynne Cousins, MD       . hydrALAZINE (APRESOLINE) injection 10 mg  10 mg Intravenous Q4H PRN Charlynne Cousins, MD      . insulin aspart (novoLOG) injection 0-15 Units  0-15 Units Subcutaneous TID WC Charlynne Cousins, MD   3 Units at 06/14/17 1028  . insulin aspart (novoLOG) injection 0-5 Units  0-5 Units Subcutaneous QHS Charlynne Cousins, MD   2 Units at 06/13/17 2116  . insulin aspart (novoLOG) injection 3 Units  3 Units Subcutaneous TID WC Charlynne Cousins, MD   3 Units at 06/14/17 1028  . insulin glargine (LANTUS) injection 10 Units  10 Units Subcutaneous BID Charlynne Cousins, MD   10 Units at 06/14/17 1135  . lacosamide (VIMPAT) tablet 100 mg  100 mg Oral BID Irene Pap N, DO      . levETIRAcetam (KEPPRA) tablet 1,500 mg  1,500 mg Oral BID Irene Pap N, DO      . ondansetron [facility]) tablet 4 mg  4 mg Oral Q6H PRN Charlynne Cousins, MD       Or  . ondansetron [facility]) injection 4 mg  4 mg Intravenous Q6H PRN Charlynne Cousins, MD       Facility-Administered Medications Ordered in Other Encounters  Medication Dose Route Frequency Provider Last Rate Last Dose  . 0.9 %  sodium chloride infusion   Intravenous Continuous Ladell Pier, MD   Stopped at 07/27/16 1630     Discharge Medications: Please see discharge summary for a list of discharge medications.  Relevant Imaging Results:  Relevant Lab Results:   Additional Information SS#[id number]. Long term care facility. Pt has appts at [facility] for infusion every 2 weeks. Needs outpatient palliative care to follow at facility  [facility], LCSW

## 2017-06-15 DIAGNOSIS — L8990 Pressure ulcer of unspecified site, unspecified stage: Secondary | ICD-10-CM

## 2017-06-15 DIAGNOSIS — Z66 Do not resuscitate: Secondary | ICD-10-CM

## 2017-06-15 LAB — GLUCOSE, CAPILLARY
GLUCOSE-CAPILLARY: 126 mg/dL — AB (ref 65–99)
GLUCOSE-CAPILLARY: 211 mg/dL — AB (ref 65–99)
Glucose-Capillary: 233 mg/dL — ABNORMAL HIGH (ref 65–99)
Glucose-Capillary: 158 mg/dL — ABNORMAL HIGH (ref 65–99)

## 2017-06-15 LAB — BASIC METABOLIC PANEL
ANION GAP: 11 (ref 5–15)
BUN: 12 mg/dL (ref 6–20)
CO2: 27 mmol/L (ref 22–32)
Calcium: 9.6 mg/dL (ref 8.9–10.3)
Chloride: 101 mmol/L (ref 101–111)
Creatinine, Ser: 0.46 mg/dL (ref 0.44–1.00)
Glucose, Bld: 198 mg/dL — ABNORMAL HIGH (ref 65–99)
POTASSIUM: 3.6 mmol/L (ref 3.5–5.1)
SODIUM: 139 mmol/L (ref 135–145)

## 2017-06-15 NOTE — Progress Notes (Signed)
PROGRESS NOTE  SHYANE FOSSUM HKV:[id number] DOB: 07/23/1965 DOA: 06/10/2017 PCP: Katherine Roan, MD  HPI/Recap of past 24 hours: [name] is an 52 y.o. 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 Mickeal Skinner recently neuro oncologist presents with worsening visual impairment and confusion, CT scan of the head showed vasogenic edema with mild left shift hydrocephalus since MRI   06/13/2017: Somnolent and not interactive.  Oncology following.  06/14/17: More alert and oriented to self. No new complaints.  06/15/17: No new complaints. Awaiting placement to SNF.  Assessment/Plan: Active Problems:   Diabetes mellitus type 2 with retinopathy ([place])   Essential hypertension   Glioblastoma multiforme of temporal lobe (HCC)   Brain mass   Seizures (HCC)   Acute metabolic encephalopathy   Altered mental status   DNR (do not resuscitate)   Pressure injury of skin  Acute metabolic encephalopathy most likely 2/2 to cerebral edema from worsening glioblastoma, improving Mother states not at baseline but more alert C/w decadron while in the hospital Continue Vimpat and keppra Oncology following  Glioblastoma multiforme Personally reviewed CT head done on 06/10/17 with mother in the room which revealed cerebral edema at left temporal lobe at skite of tumor and left to right shift Palliative consult for goals of care and symptoms management Oncology following  Possible CAP, POA completed IV ceftriaxone and IV azithromycin Afebrile procalcitonin less than 0.10  Seizures, stable Continue Vimpat Continue Keppra  HTN, stable Blood pressures well controlled Continue home medications  DM2, stable Continue insulin sliding scale  Physical debility, stable PT recommends HHPT   Code Status: DNR  Family Communication: Mother at bedside  Disposition Plan: SNF/home when clinically  stable   Consultants: Oncology  Procedures:  None  Antimicrobials:  none  DVT prophylaxis:   Subcu Lovenox  Objective: Vitals:   06/14/17 1725 06/14/17 2135 06/15/17 0638 06/15/17 1349  BP: (!) 126/92 (!) 117/92 128/85 122/90  Pulse: (!) 111 (!) 109 90 (!) 121  Resp: 17 15 14 20   Temp: 98.4 F (36.9 C) 98.4 F (36.9 C) 98.4 F (36.9 C) 98 F (36.7 C)  TempSrc: Oral Oral Oral Oral  SpO2: 100% 98% 99% 98%  Weight:      Height:        Intake/Output Summary (Last 24 hours) at 06/15/2017 1733 Last data filed at 06/15/2017 0800 Gross per 24 hour  Intake 400 ml  Output 1050 ml  Net -650 ml   Filed Weights   06/10/17 1550 06/11/17 0115 06/13/17 0443  Weight: 64 kg (141 lb) 62.5 kg (137 lb 12.6 oz) 66.4 kg (146 lb 6.2 oz)    Exam: 06/15/17  . General: 52 y.o. year-old femaleWD WN NAD A&O x 1 . Cardiovascular:RRR no krubs or gallops. No thyromegaly or JVD. Marland Kitchen Respiratory: Clear to auscultation with no wheezes or rales. Good inspiratory effort. . Abdomen: Soft nontender nondistended with normal bowel sounds x4 quadrants. . Musculoskeletal: Trace edema lower extremities bilaterally.  Skin: No ulcerative lesions noted or rashes, . Psychiatry: Mood is appropriate for condition and setting.   Data Reviewed: CBC: Recent Labs  Lab 06/10/17 1612 06/10/17 2352 06/13/17 1036 06/14/17 1048  WBC 15.5* 11.0* 12.7* 9.9  NEUTROABS 14.5*  --   --   --   HGB 13.2 12.1 12.5 12.9  HCT 39.6 36.6 36.8 38.4  MCV 88.8 87.8 87.4 87.1  PLT 257 237 253 237   Basic  Metabolic Panel: Recent Labs  Lab 06/10/17 1612 06/10/17 2352 06/13/17 1036 06/15/17 1009  NA 139  --  138 139  K 4.7  --  3.4* 3.6  CL 101  --  103 101  CO2 27  --  21* 27  GLUCOSE 175*  --  287* 198*  BUN 11  --  11 12  CREATININE 0.58 0.48 0.54 0.46  CALCIUM 9.4  --  9.2 9.6   GFR: Estimated Creatinine Clearance: 75.2 mL/min (by C-G formula based on SCr of 0.46 mg/dL). Liver Function Tests: Recent Labs   Lab 06/10/17 1612 06/13/17 1036  AST 32 61*  ALT 16 58*  ALKPHOS 39 52  BILITOT 1.3* 0.4  PROT 6.3* 6.0*  ALBUMIN 3.5 3.1*   No results for input(s): LIPASE, AMYLASE in the last 168 hours. No results for input(s): AMMONIA in the last 168 hours. Coagulation Profile: No results for input(s): INR, PROTIME in the last 168 hours. Cardiac Enzymes: Recent Labs  Lab 06/11/17 0941  CKTOTAL 19*   BNP (last 3 results) No results for input(s): PROBNP in the last 8760 hours. HbA1C: No results for input(s): HGBA1C in the last 72 hours. CBG: Recent Labs  Lab 06/14/17 1817 06/14/17 2140 06/15/17 0826 06/15/17 1217 06/15/17 1731  GLUCAP 189* 220* 233* 126* 158*   Lipid Profile: No results for input(s): CHOL, HDL, LDLCALC, TRIG, CHOLHDL, LDLDIRECT in the last 72 hours. Thyroid Function Tests: No results for input(s): TSH, T4TOTAL, FREET4, T3FREE, THYROIDAB in the last 72 hours. Anemia Panel: No results for input(s): VITAMINB12, FOLATE, FERRITIN, TIBC, IRON, RETICCTPCT in the last 72 hours. Urine analysis:    Component Value Date/Time   COLORURINE YELLOW 08/20/2016 0827   APPEARANCEUR HAZY (A) 08/20/2016 0827   LABSPEC 1.010 08/20/2016 0827   LABSPEC 1.010 08/09/2016 1502   PHURINE 5.0 08/20/2016 0827   GLUCOSEU 50 (A) 08/20/2016 0827   GLUCOSEU Negative 08/09/2016 1502   HGBUR NEGATIVE 08/20/2016 0827   BILIRUBINUR NEGATIVE 08/20/2016 0827   BILIRUBINUR Negative 08/09/2016 1502   KETONESUR NEGATIVE 08/20/2016 0827   PROTEINUR NEGATIVE 08/20/2016 0827   UROBILINOGEN 0.2 08/09/2016 1502   NITRITE NEGATIVE 08/20/2016 0827   LEUKOCYTESUR MODERATE (A) 08/20/2016 0827   LEUKOCYTESUR Negative 08/09/2016 1502   Sepsis Labs: @LABRCNTIP (procalcitonin:4,lacticidven:4)  ) Recent Results (from the past 240 hour(s))  MRSA PCR Screening     Status: None   Collection Time: 06/11/17  1:17 AM  Result Value Ref Range Status   MRSA by PCR NEGATIVE NEGATIVE Final    Comment:         The GeneXpert MRSA Assay (FDA approved for NASAL specimens only), is one component of a comprehensive MRSA colonization surveillance program. It is not intended to diagnose MRSA infection nor to guide or monitor treatment for MRSA infections. Performed at [facility], [place] [address] [place], [place] [address]       Studies: No results found.  Scheduled Meds: . aspirin EC  81 mg Oral Daily  . dexamethasone  4 mg Intravenous Q6H  . enoxaparin (LOVENOX) injection  40 mg Subcutaneous Daily  . fluticasone  2 spray Each Nare Daily  . insulin aspart  0-15 Units Subcutaneous TID WC  . insulin aspart  0-5 Units Subcutaneous QHS  . insulin aspart  3 Units Subcutaneous TID WC  . insulin glargine  10 Units Subcutaneous BID  . lacosamide  100 mg Oral BID  . levETIRAcetam  1,500 mg Oral BID    Continuous Infusions:  LOS: 5 days     Kayleen Memos, MD [facility] Pager [phone]  If 7PM-7AM, please contact night-coverage [email or web address] Password [email or web address] 06/15/2017, 5:33 PM     PROGRESS NOTE  SUHAILA TROIANO TSV:[id number] DOB: 15-Sep-1965 DOA: 06/10/2017 PCP: Katherine Roan, MD  HPI/Recap of past 24 hours: [name] is an 52 y.o. 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 Mickeal Skinner recently neuro oncologist presents with worsening visual impairment and confusion, CT scan of the head showed vasogenic edema with mild left shift hydrocephalus since MRI   06/16/2017: Somnolent and not interactive.  Oncology following.  Assessment/Plan: Active Problems:   Diabetes mellitus type 2 with retinopathy ([place])   Essential hypertension   Glioblastoma multiforme of temporal lobe (HCC)   Brain mass   Seizures (HCC)   Acute metabolic encephalopathy   Altered mental status   DNR (do not resuscitate)   Pressure injury of skin  Acute metabolic encephalopathy  most likely secondary to glioblastoma multiforme of temporal lobe/seizure disorder Continue Decadron Continue Vimpat Oncology following  Glioblastoma multiforme Oncology following We will consult palliative care team in the morning for goals of care  Possible CAP, POA On IV ceftriaxone and IV azithromycin Afebrile Repeat CBC in the morning  Seizures Continue Vimpat Continue Keppra  Hypertension Blood pressures well controlled Continue home medications  Type 2 diabetes Continue insulin sliding scale   Code Status: DNR  Family Communication: Mother at bedside  Disposition Plan: SNF/home when clinically stable   Consultants: Oncology  Procedures:  None  Antimicrobials:  IV ceftriaxone and IV azithromycin  DVT prophylaxis:   Subcu Lovenox  Objective: Vitals:   06/14/17 1725 06/14/17 2135 06/15/17 0638 06/15/17 1349  BP: (!) 126/92 (!) 117/92 128/85 122/90  Pulse: (!) 111 (!) 109 90 (!) 121  Resp: 17 15 14 20   Temp: 98.4 F (36.9 C) 98.4 F (36.9 C) 98.4 F (36.9 C) 98 F (36.7 C)  TempSrc: Oral Oral Oral Oral  SpO2: 100% 98% 99% 98%  Weight:      Height:        Intake/Output Summary (Last 24 hours) at 06/15/2017 1733 Last data filed at 06/15/2017 0800 Gross per 24 hour  Intake 400 ml  Output 1050 ml  Net -650 ml   Filed Weights   06/10/17 1550 06/11/17 0115 06/13/17 0443  Weight: 64 kg (141 lb) 62.5 kg (137 lb 12.6 oz) 66.4 kg (146 lb 6.2 oz)    Exam:  . General: 52 y.o. year-old female well developed well nourished in no acute distress.  Somnolent. . Cardiovascular: Regular rate and rhythm with no rubs or gallops.  No thyromegaly or JVD noted.   Marland Kitchen Respiratory: Clear to auscultation with no wheezes or rales. Good inspiratory effort. . Abdomen: Soft nontender nondistended with normal bowel sounds x4 quadrants. . Musculoskeletal: Trace edema lower extremities bilaterally.  Skin: No ulcerative lesions noted or rashes, . Psychiatry: Unable to  assess due to somnolence.   Data Reviewed: CBC: Recent Labs  Lab 06/10/17 1612 06/10/17 2352 06/13/17 1036 06/14/17 1048  WBC 15.5* 11.0* 12.7* 9.9  NEUTROABS 14.5*  --   --   --   HGB 13.2 12.1 12.5 12.9  HCT 39.6 36.6 36.8 38.4  MCV 88.8 87.8 87.4 87.1  PLT 257 237 253 923   Basic Metabolic Panel: Recent Labs  Lab 06/10/17 1612 06/10/17 2352 06/13/17 1036 06/15/17 1009  NA 139  --  138 139  K 4.7  --  3.4* 3.6  CL 101  --  103 101  CO2 27  --  21* 27  GLUCOSE 175*  --  287* 198*  BUN 11  --  11 12  CREATININE 0.58 0.48 0.54 0.46  CALCIUM 9.4  --  9.2 9.6   GFR: Estimated Creatinine Clearance: 75.2 mL/min (by C-G formula based on SCr of 0.46 mg/dL). Liver Function Tests: Recent Labs  Lab 06/10/17 1612 06/13/17 1036  AST 32 61*  ALT 16 58*  ALKPHOS 39 52  BILITOT 1.3* 0.4  PROT 6.3* 6.0*  ALBUMIN 3.5 3.1*   No results for input(s): LIPASE, AMYLASE in the last 168 hours. No results for input(s): AMMONIA in the last 168 hours. Coagulation Profile: No results for input(s): INR, PROTIME in the last 168 hours. Cardiac Enzymes: Recent Labs  Lab 06/11/17 0941  CKTOTAL 19*   BNP (last 3 results) No results for input(s): PROBNP in the last 8760 hours. HbA1C: No results for input(s): HGBA1C in the last 72 hours. CBG: Recent Labs  Lab 06/14/17 1817 06/14/17 2140 06/15/17 0826 06/15/17 1217 06/15/17 1731  GLUCAP 189* 220* 233* 126* 158*   Lipid Profile: No results for input(s): CHOL, HDL, LDLCALC, TRIG, CHOLHDL, LDLDIRECT in the last 72 hours. Thyroid Function Tests: No results for input(s): TSH, T4TOTAL, FREET4, T3FREE, THYROIDAB in the last 72 hours. Anemia Panel: No results for input(s): VITAMINB12, FOLATE, FERRITIN, TIBC, IRON, RETICCTPCT in the last 72 hours. Urine analysis:    Component Value Date/Time   COLORURINE YELLOW 08/20/2016 0827   APPEARANCEUR HAZY (A) 08/20/2016 0827   LABSPEC 1.010 08/20/2016 0827   LABSPEC 1.010 08/09/2016 1502    PHURINE 5.0 08/20/2016 0827   GLUCOSEU 50 (A) 08/20/2016 0827   GLUCOSEU Negative 08/09/2016 1502   HGBUR NEGATIVE 08/20/2016 0827   BILIRUBINUR NEGATIVE 08/20/2016 0827   BILIRUBINUR Negative 08/09/2016 1502   KETONESUR NEGATIVE 08/20/2016 0827   PROTEINUR NEGATIVE 08/20/2016 0827   UROBILINOGEN 0.2 08/09/2016 1502   NITRITE NEGATIVE 08/20/2016 0827   LEUKOCYTESUR MODERATE (A) 08/20/2016 0827   LEUKOCYTESUR Negative 08/09/2016 1502   Sepsis Labs: @LABRCNTIP (procalcitonin:4,lacticidven:4)  ) Recent Results (from the past 240 hour(s))  MRSA PCR Screening     Status: None   Collection Time: 06/11/17  1:17 AM  Result Value Ref Range Status   MRSA by PCR NEGATIVE NEGATIVE Final    Comment:        The GeneXpert MRSA Assay (FDA approved for NASAL specimens only), is one component of a comprehensive MRSA colonization surveillance program. It is not intended to diagnose MRSA infection nor to guide or monitor treatment for MRSA infections. Performed at [facility], [place] [address] [place], [place] [address]       Studies: No results found.  Scheduled Meds: . aspirin EC  81 mg Oral Daily  . dexamethasone  4 mg Intravenous Q6H  . enoxaparin (LOVENOX) injection  40 mg Subcutaneous Daily  . fluticasone  2 spray Each Nare Daily  . insulin aspart  0-15 Units Subcutaneous TID WC  . insulin aspart  0-5 Units Subcutaneous QHS  . insulin aspart  3 Units Subcutaneous TID WC  . insulin glargine  10 Units Subcutaneous BID  . lacosamide  100 mg Oral BID  . levETIRAcetam  1,500 mg Oral BID    Continuous Infusions:    LOS: 5 days     Kayleen Memos, MD [facility] Pager [phone]  If 7PM-7AM,  please contact night-coverage [email or web address] Password [facility] 06/15/2017, 5:33 PM

## 2017-06-15 NOTE — Progress Notes (Signed)
   06/15/17 1400  Clinical Encounter Type  Visited With Patient and family together  Visit Type Initial;Psychological support;Spiritual support  Referral From Palliative care team  Consult/Referral To Chaplain  Spiritual Encounters  Spiritual Needs Emotional;Other (Comment) (Spiritual Care Conversation/Support)  Stress Factors  Patient Stress Factors Not reviewed  Family Stress Factors Not reviewed   I visited with the patient per spiritual care consult from the palliative care team. The patient was asleep at the time of my visit and the patient's relative was on the phone. I will follow-up at a better time.   Please, contact Spiritual Care for further assistance.   Chaplain Shanon Ace M.Div., [facility]

## 2017-06-15 NOTE — Progress Notes (Signed)
Physical Therapy Treatment Patient Details Name: Jaime [name] MRN: [id number] DOB: [birth year] Today's Date: 06/15/2017    History of Present Illness 52 y.o. female past medical history of high-grade glioblastoma multiforme , presents with worsening visual impairment and confusion, CT scan of the head showed vasogenic edema with mild left shift hydrocephalus since MRI    PT Comments    Pt continues to require Mod assist +2 for safety for ambulation. Poor balance and high risk for falls.   Follow Up Recommendations  SNF;Supervision/Assistance - 24 hour (HHPT if SNF not an option)     Equipment Recommendations  None recommended by PT    Recommendations for Other Services       Precautions / Restrictions Precautions Precautions: Fall Precaution Comments: unsteady with walking, 8 falls in past 1 year per pt's mother Restrictions Weight Bearing Restrictions: No    Mobility  Bed Mobility Overal bed mobility: Needs Assistance Bed Mobility: Supine to Sit     Supine to sit: Mod assist;HOB elevated     General bed mobility comments: self initiates trying to sit up, decreased trunk flexion, assist with legs and trunk to sitting. Increased time. Multimodal cueing required.   Transfers Overall transfer level: Needs assistance Equipment used: Rolling walker (2 wheeled) Transfers: Sit to/from Stand Sit to Stand: Mod assist         General transfer comment: Assist to rise, stabilize, control descent. Multimodal cueing required. Increaesed time. Posterior bias. LOB x 1.   Ambulation/Gait Ambulation/Gait assistance: Mod assist;+2 safety/equipment Ambulation Distance (Feet): 45 Feet Assistive device: Rolling walker (2 wheeled) Gait Pattern/deviations: Leaning posteriorly;Step-to pattern;Decreased stride length;Step-through pattern     General Gait Details: Assist to stabilize pt to prevent fall backwards and to maneuver safely with RW. With walker guidance and continuous  steady pace set by therapist, pt does well. Without guidance, gait pattern lacks fluidity and pt easily loses balance backwards. LOB x 4   Stairs             Wheelchair Mobility    Modified Rankin (Stroke Patients Only)       Balance Overall balance assessment: Needs assistance;History of Falls   Sitting balance-Leahy Scale: Poor Sitting balance - Comments: Left lateral lean Postural control: Posterior lean Standing balance support: Bilateral upper extremity supported Standing balance-Leahy Scale: Poor                              Cognition Arousal/Alertness: Awake/alert Behavior During Therapy: Impulsive Overall Cognitive Status: History of cognitive impairments - at baseline                                 General Comments: speaks 1-2 words.       Exercises      General Comments        Pertinent Vitals/Pain Pain Assessment: No/denies pain    Home Living                      Prior Function            PT Goals (current goals can now be found in the care plan section) Progress towards PT goals: Progressing toward goals    Frequency    Min 2X/week      PT Plan Current plan remains appropriate    Co-evaluation  AM-PAC PT "6 Clicks" Daily Activity  Outcome Measure  Difficulty turning over in bed (including adjusting bedclothes, sheets and blankets)?: Unable Difficulty moving from lying on back to sitting on the side of the bed? : Unable Difficulty sitting down on and standing up from a chair with arms (e.g., wheelchair, bedside commode, etc,.)?: Unable Help needed moving to and from a bed to chair (including a wheelchair)?: Total Help needed walking in hospital room?: Total Help needed climbing 3-5 steps with a railing? : Total 6 Click Score: 6    End of Session Equipment Utilized During Treatment: Gait belt Activity Tolerance: Patient tolerated treatment well Patient left: in chair;with  call bell/phone within reach;with family/visitor present   PT Visit Diagnosis: Unsteadiness on feet (R26.81);Hemiplegia and hemiparesis;Muscle weakness (generalized) (M62.81);Difficulty in walking, not elsewhere classified (R26.2);History of falling (Z91.81);Repeated falls (R29.6)     Time: 1447-1510 PT Time Calculation (min) (ACUTE ONLY): 23 min  Charges:  $Gait Training: 23-37 mins                    G Codes:          Weston Anna, MPT Pager: [phone]

## 2017-06-16 LAB — GLUCOSE, CAPILLARY
GLUCOSE-CAPILLARY: 226 mg/dL — AB (ref 65–99)
Glucose-Capillary: 145 mg/dL — ABNORMAL HIGH (ref 65–99)
Glucose-Capillary: 205 mg/dL — ABNORMAL HIGH (ref 65–99)
Glucose-Capillary: 144 mg/dL — ABNORMAL HIGH (ref 65–99)

## 2017-06-16 MED ORDER — SENNOSIDES-DOCUSATE SODIUM 8.6-50 MG PO TABS
1.0000 | ORAL_TABLET | Freq: Two times a day (BID) | ORAL | Status: DC
  Administered 2017-06-16 – 2017-06-23 (×14): 1 via ORAL
  Filled 2017-06-16 (×15): qty 1

## 2017-06-16 MED ORDER — POLYETHYLENE GLYCOL 3350 17 G PO PACK
17.0000 g | PACK | Freq: Every day | ORAL | Status: DC
  Administered 2017-06-16 – 2017-06-23 (×5): 17 g via ORAL
  Filled 2017-06-16 (×6): qty 1

## 2017-06-16 MED ORDER — BISACODYL 5 MG PO TBEC: 5.0000 mg | DELAYED_RELEASE_TABLET | Freq: Every day | ORAL | Status: DC | PRN

## 2017-06-16 NOTE — Progress Notes (Addendum)
PROGRESS NOTE  Jaime SCAFF OYD:[id number] DOB: 12-Nov-1965 DOA: 06/10/2017 PCP: Katherine Roan, MD  HPI/Recap of past 24 hours: Jaime [name] is an 52 y.o. 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 Mickeal Skinner recently neuro oncologist presents with worsening visual impairment and confusion, CT scan of the head showed vasogenic edema with mild left shift hydrocephalus since MRI   06/13/2017: Somnolent and not interactive.  Oncology following.  06/14/17: More alert and oriented to self. No new complaints.  06/15/17: No new complaints. Awaiting placement to SNF.  Assessment/Plan: Active Problems:   Diabetes mellitus type 2 with retinopathy ([place])   Essential hypertension   Glioblastoma multiforme of temporal lobe (HCC)   Brain mass   Seizures (HCC)   Acute metabolic encephalopathy   Altered mental status   DNR (do not resuscitate)   Pressure injury of skin  Acute metabolic encephalopathy most likely 2/2 to cerebral edema from worsening glioblastoma, improving Mother states not at baseline but more alert C/w decadron while in the hospital Continue Vimpat and keppra Oncology following  Glioblastoma multiforme Personally reviewed CT head done on 06/10/17 with mother in the room which revealed cerebral edema at left temporal lobe at skite of tumor and left to right shift Palliative consult for goals of care and symptoms management Oncology following  Possible CAP, POA completed IV ceftriaxone and IV azithromycin Afebrile procalcitonin less than 0.10  Seizures, stable Continue Vimpat Continue Keppra  HTN, stable Blood pressures well controlled Continue home medications  DM2, stable Continue insulin sliding scale  Physical debility, stable PT recommends HHPT   Code Status: DNR  Family Communication: Mother at bedside  Disposition Plan: SNF/home when clinically  stable   Consultants: Oncology  Procedures:  None  Antimicrobials:  none  DVT prophylaxis:   Subcu Lovenox  Objective: Vitals:   06/15/17 1349 06/15/17 2106 06/16/17 0451 06/16/17 1448  BP: 122/90 (!) 128/93 (!) 126/98 117/86  Pulse: (!) 121 (!) 105 84 (!) 108  Resp: 20 18 18 16   Temp: 98 F (36.7 C) 98.7 F (37.1 C) 98.1 F (36.7 C) 98.5 F (36.9 C)  TempSrc: Oral Oral Oral Oral  SpO2: 98% 98% 100% 100%  Weight:      Height:        Intake/Output Summary (Last 24 hours) at 06/16/2017 1454 Last data filed at 06/15/2017 1839 Gross per 24 hour  Intake -  Output 600 ml  Net -600 ml   Filed Weights   06/10/17 1550 06/11/17 0115 06/13/17 0443  Weight: 64 kg (141 lb) 62.5 kg (137 lb 12.6 oz) 66.4 kg (146 lb 6.2 oz)    Exam: 06/15/17  . General: 52 y.o. year-old femaleWD WN NAD A&O x 1 . Cardiovascular:RRR no krubs or gallops. No thyromegaly or JVD. Marland Kitchen Respiratory: Clear to auscultation with no wheezes or rales. Good inspiratory effort. . Abdomen: Soft nontender nondistended with normal bowel sounds x4 quadrants. . Musculoskeletal: Trace edema lower extremities bilaterally.  Skin: No ulcerative lesions noted or rashes, . Psychiatry: Mood is appropriate for condition and setting.   Data Reviewed: CBC: Recent Labs  Lab 06/10/17 1612 06/10/17 2352 06/13/17 1036 06/14/17 1048  WBC 15.5* 11.0* 12.7* 9.9  NEUTROABS 14.5*  --   --   --   HGB 13.2 12.1 12.5 12.9  HCT 39.6 36.6 36.8 38.4  MCV 88.8 87.8 87.4 87.1  PLT 257 237 253 672   Basic Metabolic  Panel: Recent Labs  Lab 06/10/17 1612 06/10/17 2352 06/13/17 1036 06/15/17 1009  NA 139  --  138 139  K 4.7  --  3.4* 3.6  CL 101  --  103 101  CO2 27  --  21* 27  GLUCOSE 175*  --  287* 198*  BUN 11  --  11 12  CREATININE 0.58 0.48 0.54 0.46  CALCIUM 9.4  --  9.2 9.6   GFR: Estimated Creatinine Clearance: 75.2 mL/min (by C-G formula based on SCr of 0.46 mg/dL). Liver Function Tests: Recent Labs  Lab  06/10/17 1612 06/13/17 1036  AST 32 61*  ALT 16 58*  ALKPHOS 39 52  BILITOT 1.3* 0.4  PROT 6.3* 6.0*  ALBUMIN 3.5 3.1*   No results for input(s): LIPASE, AMYLASE in the last 168 hours. No results for input(s): AMMONIA in the last 168 hours. Coagulation Profile: No results for input(s): INR, PROTIME in the last 168 hours. Cardiac Enzymes: Recent Labs  Lab 06/11/17 0941  CKTOTAL 19*   BNP (last 3 results) No results for input(s): PROBNP in the last 8760 hours. HbA1C: No results for input(s): HGBA1C in the last 72 hours. CBG: Recent Labs  Lab 06/15/17 1217 06/15/17 1731 06/15/17 2109 06/16/17 0746 06/16/17 1140  GLUCAP 126* 158* 211* 145* 205*   Lipid Profile: No results for input(s): CHOL, HDL, LDLCALC, TRIG, CHOLHDL, LDLDIRECT in the last 72 hours. Thyroid Function Tests: No results for input(s): TSH, T4TOTAL, FREET4, T3FREE, THYROIDAB in the last 72 hours. Anemia Panel: No results for input(s): VITAMINB12, FOLATE, FERRITIN, TIBC, IRON, RETICCTPCT in the last 72 hours. Urine analysis:    Component Value Date/Time   COLORURINE YELLOW 08/20/2016 0827   APPEARANCEUR HAZY (A) 08/20/2016 0827   LABSPEC 1.010 08/20/2016 0827   LABSPEC 1.010 08/09/2016 1502   PHURINE 5.0 08/20/2016 0827   GLUCOSEU 50 (A) 08/20/2016 0827   GLUCOSEU Negative 08/09/2016 1502   HGBUR NEGATIVE 08/20/2016 0827   BILIRUBINUR NEGATIVE 08/20/2016 0827   BILIRUBINUR Negative 08/09/2016 1502   KETONESUR NEGATIVE 08/20/2016 0827   PROTEINUR NEGATIVE 08/20/2016 0827   UROBILINOGEN 0.2 08/09/2016 1502   NITRITE NEGATIVE 08/20/2016 0827   LEUKOCYTESUR MODERATE (A) 08/20/2016 0827   LEUKOCYTESUR Negative 08/09/2016 1502   Sepsis Labs: @LABRCNTIP (procalcitonin:4,lacticidven:4)  ) Recent Results (from the past 240 hour(s))  MRSA PCR Screening     Status: None   Collection Time: 06/11/17  1:17 AM  Result Value Ref Range Status   MRSA by PCR NEGATIVE NEGATIVE Final    Comment:        The  GeneXpert MRSA Assay (FDA approved for NASAL specimens only), is one component of a comprehensive MRSA colonization surveillance program. It is not intended to diagnose MRSA infection nor to guide or monitor treatment for MRSA infections. Performed at [facility], [place] [address] [place], [place] [address]       Studies: No results found.  Scheduled Meds: . aspirin EC  81 mg Oral Daily  . dexamethasone  4 mg Intravenous Q6H  . enoxaparin (LOVENOX) injection  40 mg Subcutaneous Daily  . fluticasone  2 spray Each Nare Daily  . insulin aspart  0-15 Units Subcutaneous TID WC  . insulin aspart  0-5 Units Subcutaneous QHS  . insulin aspart  3 Units Subcutaneous TID WC  . insulin glargine  10 Units Subcutaneous BID  . lacosamide  100 mg Oral BID  . levETIRAcetam  1,500 mg Oral BID  . polyethylene glycol  17  g Oral Daily  . senna-docusate  1 tablet Oral BID    Continuous Infusions:    LOS: 6 days     Kayleen Memos, MD [facility] Pager [phone]  If 7PM-7AM, please contact night-coverage [email or web address] Password [email or web address] 06/16/2017, 2:54 PM     PROGRESS NOTE  CHIRSTY ARMISTEAD OZD:[id number] DOB: 1965-12-12 DOA: 06/10/2017 PCP: Katherine Roan, MD  HPI/Recap of past 24 hours: Jaime [name] is an 52 y.o. 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 Mickeal Skinner recently neuro oncologist presents with worsening visual impairment and confusion, CT scan of the head showed vasogenic edema with mild left shift hydrocephalus since MRI  06/16/2017: Alert and oriented x 1. No new complaints.   Assessment/Plan: Active Problems:   Diabetes mellitus type 2 with retinopathy ([place])   Essential hypertension   Glioblastoma multiforme of temporal lobe (HCC)   Brain mass   Seizures (HCC)   Acute metabolic encephalopathy   Altered mental status   DNR (do not  resuscitate)   Pressure injury of skin  Acute metabolic encephalopathy, improving Most likely secondary to glioblastoma multiforme of temporal lobe/seizure disorder Continue Decadron Continue Vimpat Oncology following  Glioblastoma multiforme, stable Oncology following We will consult palliative care team in the morning for goals of care  Seizure disorder, stable Continue Vimpat Continue Keppra  HTN, stable Blood pressures well controlled Continue home medications  DM2, stable Continue insulin sliding scale   Code Status: DNR  Family Communication: Mother at bedside  Disposition Plan: SNF/home when clinically stable   Consultants: Oncology  Procedures:  None  Antimicrobials:  None  DVT prophylaxis:   Subcu Lovenox  Objective: Vitals:   06/15/17 1349 06/15/17 2106 06/16/17 0451 06/16/17 1448  BP: 122/90 (!) 128/93 (!) 126/98 117/86  Pulse: (!) 121 (!) 105 84 (!) 108  Resp: 20 18 18 16   Temp: 98 F (36.7 C) 98.7 F (37.1 C) 98.1 F (36.7 C) 98.5 F (36.9 C)  TempSrc: Oral Oral Oral Oral  SpO2: 98% 98% 100% 100%  Weight:      Height:        Intake/Output Summary (Last 24 hours) at 06/16/2017 1454 Last data filed at 06/15/2017 1839 Gross per 24 hour  Intake -  Output 600 ml  Net -600 ml   Filed Weights   06/10/17 1550 06/11/17 0115 06/13/17 0443  Weight: 64 kg (141 lb) 62.5 kg (137 lb 12.6 oz) 66.4 kg (146 lb 6.2 oz)    Exam: 06/16/17  . General: 52 y.o. year-old female WD wN NAD A&O x 3 . Cardiovascular: RRR no rubs or gallops. No JVD or thyromegaly. Marland Kitchen Respiratory: Clear to auscultation with no wheezes or rales. Good inspiratory effort. . Abdomen: Soft nontender nondistended with normal bowel sounds x4 quadrants. . Musculoskeletal: Trace edema lower extremities bilaterally.  Skin: No ulcerative lesions noted or rashes, . Psychiatry: Unable to assess due to somnolence.   Data Reviewed: CBC: Recent Labs  Lab 06/10/17 1612  06/10/17 2352 06/13/17 1036 06/14/17 1048  WBC 15.5* 11.0* 12.7* 9.9  NEUTROABS 14.5*  --   --   --   HGB 13.2 12.1 12.5 12.9  HCT 39.6 36.6 36.8 38.4  MCV 88.8 87.8 87.4 87.1  PLT 257 237 253 259   Basic Metabolic Panel: Recent Labs  Lab 06/10/17 1612 06/10/17 2352 06/13/17 1036 06/15/17 1009  NA 139  --  138 139  K 4.7  --  3.4* 3.6  CL 101  --  103 101  CO2 27  --  21* 27  GLUCOSE 175*  --  287* 198*  BUN 11  --  11 12  CREATININE 0.58 0.48 0.54 0.46  CALCIUM 9.4  --  9.2 9.6   GFR: Estimated Creatinine Clearance: 75.2 mL/min (by C-G formula based on SCr of 0.46 mg/dL). Liver Function Tests: Recent Labs  Lab 06/10/17 1612 06/13/17 1036  AST 32 61*  ALT 16 58*  ALKPHOS 39 52  BILITOT 1.3* 0.4  PROT 6.3* 6.0*  ALBUMIN 3.5 3.1*   No results for input(s): LIPASE, AMYLASE in the last 168 hours. No results for input(s): AMMONIA in the last 168 hours. Coagulation Profile: No results for input(s): INR, PROTIME in the last 168 hours. Cardiac Enzymes: Recent Labs  Lab 06/11/17 0941  CKTOTAL 19*   BNP (last 3 results) No results for input(s): PROBNP in the last 8760 hours. HbA1C: No results for input(s): HGBA1C in the last 72 hours. CBG: Recent Labs  Lab 06/15/17 1217 06/15/17 1731 06/15/17 2109 06/16/17 0746 06/16/17 1140  GLUCAP 126* 158* 211* 145* 205*   Lipid Profile: No results for input(s): CHOL, HDL, LDLCALC, TRIG, CHOLHDL, LDLDIRECT in the last 72 hours. Thyroid Function Tests: No results for input(s): TSH, T4TOTAL, FREET4, T3FREE, THYROIDAB in the last 72 hours. Anemia Panel: No results for input(s): VITAMINB12, FOLATE, FERRITIN, TIBC, IRON, RETICCTPCT in the last 72 hours. Urine analysis:    Component Value Date/Time   COLORURINE YELLOW 08/20/2016 0827   APPEARANCEUR HAZY (A) 08/20/2016 0827   LABSPEC 1.010 08/20/2016 0827   LABSPEC 1.010 08/09/2016 1502   PHURINE 5.0 08/20/2016 0827   GLUCOSEU 50 (A) 08/20/2016 0827   GLUCOSEU  Negative 08/09/2016 1502   HGBUR NEGATIVE 08/20/2016 0827   BILIRUBINUR NEGATIVE 08/20/2016 0827   BILIRUBINUR Negative 08/09/2016 1502   KETONESUR NEGATIVE 08/20/2016 0827   PROTEINUR NEGATIVE 08/20/2016 0827   UROBILINOGEN 0.2 08/09/2016 1502   NITRITE NEGATIVE 08/20/2016 0827   LEUKOCYTESUR MODERATE (A) 08/20/2016 0827   LEUKOCYTESUR Negative 08/09/2016 1502   Sepsis Labs: @LABRCNTIP (procalcitonin:4,lacticidven:4)  ) Recent Results (from the past 240 hour(s))  MRSA PCR Screening     Status: None   Collection Time: 06/11/17  1:17 AM  Result Value Ref Range Status   MRSA by PCR NEGATIVE NEGATIVE Final    Comment:        The GeneXpert MRSA Assay (FDA approved for NASAL specimens only), is one component of a comprehensive MRSA colonization surveillance program. It is not intended to diagnose MRSA infection nor to guide or monitor treatment for MRSA infections. Performed at [facility], [place] [address] [place], [place] [address]       Studies: No results found.  Scheduled Meds: . aspirin EC  81 mg Oral Daily  . dexamethasone  4 mg Intravenous Q6H  . enoxaparin (LOVENOX) injection  40 mg Subcutaneous Daily  . fluticasone  2 spray Each Nare Daily  . insulin aspart  0-15 Units Subcutaneous TID WC  . insulin aspart  0-5 Units Subcutaneous QHS  . insulin aspart  3 Units Subcutaneous TID WC  . insulin glargine  10 Units Subcutaneous BID  . lacosamide  100 mg Oral BID  . levETIRAcetam  1,500 mg Oral BID  . polyethylene glycol  17 g Oral Daily  . senna-docusate  1 tablet Oral BID    Continuous Infusions:    LOS: 6 days     Lorenda Cahill  [place] Crane, MD [facility] Pager [phone]  If 7PM-7AM, please contact night-coverage [email or web address] Password [email or web address] 06/16/2017, 2:54 PM

## 2017-06-17 LAB — GLUCOSE, CAPILLARY
Glucose-Capillary: 156 mg/dL — ABNORMAL HIGH (ref 65–99)
Glucose-Capillary: 207 mg/dL — ABNORMAL HIGH (ref 65–99)

## 2017-06-17 LAB — CREATININE, SERUM: Creatinine, Ser: 0.39 mg/dL — ABNORMAL LOW (ref 0.44–1.00)

## 2017-06-17 NOTE — Progress Notes (Signed)
PROGRESS NOTE  Jaime [name]:[id number] DOB: 1965/10/17 DOA: 06/10/2017 PCP: Katherine Roan, MD  HPI/Recap of past 24 hours: Jaime [name] is an 52 y.o. 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 Mickeal Skinner recently neuro oncologist presents with worsening visual impairment and confusion, CT scan of the head showed vasogenic edema with mild left shift hydrocephalus since MRI.  06/17/17: No new complaints. Awaiting placement to SNF.  Assessment/Plan: Active Problems:   Diabetes mellitus type 2 with retinopathy ([place])   Essential hypertension   Glioblastoma multiforme of temporal lobe (HCC)   Brain mass   Seizures (HCC)   Acute metabolic encephalopathy   Altered mental status   DNR (do not resuscitate)   Pressure injury of skin  Acute metabolic encephalopathy most likely 2/2 to cerebral edema from worsening glioblastoma, improving Mother states not at baseline but more alert C/w decadron while in the hospital Continue Vimpat and keppra Oncology following  Glioblastoma multiforme Personally reviewed CT head done on 06/10/17 with mother in the room which revealed cerebral edema at left temporal lobe at skite of tumor and left to right shift Palliative consult for goals of care and symptoms management Oncology following  Possible CAP, POA completed IV ceftriaxone and IV azithromycin Afebrile procalcitonin less than 0.10  Seizures, stable Continue Vimpat Continue Keppra  HTN, stable Blood pressures well controlled Continue home medications  DM2, stable Continue insulin sliding scale  Physical debility, stable PT recommends HHPT   Code Status: DNR  Family Communication: Mother at bedside  Disposition Plan: SNF/home when clinically stable   Consultants: Oncology  Procedures:  None  Antimicrobials:  none  DVT prophylaxis:   Subcu  Lovenox  Objective: Vitals:   06/16/17 1448 06/16/17 2118 06/17/17 0512 06/17/17 1401  BP: 117/86 128/90 (!) 132/96 123/85  Pulse: (!) 108 (!) 102 74 (!) 109  Resp: 16 14 14 16   Temp: 98.5 F (36.9 C) 98.7 F (37.1 C) 98.3 F (36.8 C) 98.6 F (37 C)  TempSrc: Oral Oral Oral Oral  SpO2: 100% 98% 98% 99%  Weight:      Height:        Intake/Output Summary (Last 24 hours) at 06/17/2017 1443 Last data filed at 06/17/2017 1441 Gross per 24 hour  Intake 1800 ml  Output 1150 ml  Net 650 ml   Filed Weights   06/10/17 1550 06/11/17 0115 06/13/17 0443  Weight: 64 kg (141 lb) 62.5 kg (137 lb 12.6 oz) 66.4 kg (146 lb 6.2 oz)    Exam: 06/17/17  . General: 52 y.o. year-old female WD WN NAD A&O x 1 . Cardiovascular:RRR no rubs or gallops. No JVD or thyromegaly. Marland Kitchen Respiratory: Clear to auscultation with no wheezes or rales. Good inspiratory effort. . Abdomen: Soft nontender nondistended with normal bowel sounds x4 quadrants. . Musculoskeletal: Trace edema lower extremities bilaterally.  Skin: No ulcerative lesions noted or rashes, . Psychiatry: Mood is appropriate for condition and setting.   Data Reviewed: CBC: Recent Labs  Lab 06/10/17 1612 06/10/17 2352 06/13/17 1036 06/14/17 1048  WBC 15.5* 11.0* 12.7* 9.9  NEUTROABS 14.5*  --   --   --   HGB 13.2 12.1 12.5 12.9  HCT 39.6 36.6 36.8 38.4  MCV 88.8 87.8 87.4 87.1  PLT 257 237 253 425   Basic Metabolic Panel: Recent Labs  Lab 06/10/17 1612 06/10/17 2352 06/13/17 1036 06/15/17 1009 06/17/17 0512  NA 139  --  138 139  --   K 4.7  --  3.4* 3.6  --   CL 101  --  103 101  --   CO2 27  --  21* 27  --   GLUCOSE 175*  --  287* 198*  --   BUN 11  --  11 12  --   CREATININE 0.58 0.48 0.54 0.46 0.39*  CALCIUM 9.4  --  9.2 9.6  --    GFR: Estimated Creatinine Clearance: 75.2 mL/min (A) (by C-G formula based on SCr of 0.39 mg/dL (L)). Liver Function Tests: Recent Labs  Lab 06/10/17 1612 06/13/17 1036  AST 32 61*  ALT  16 58*  ALKPHOS 39 52  BILITOT 1.3* 0.4  PROT 6.3* 6.0*  ALBUMIN 3.5 3.1*   No results for input(s): LIPASE, AMYLASE in the last 168 hours. No results for input(s): AMMONIA in the last 168 hours. Coagulation Profile: No results for input(s): INR, PROTIME in the last 168 hours. Cardiac Enzymes: Recent Labs  Lab 06/11/17 0941  CKTOTAL 19*   BNP (last 3 results) No results for input(s): PROBNP in the last 8760 hours. HbA1C: No results for input(s): HGBA1C in the last 72 hours. CBG: Recent Labs  Lab 06/16/17 0746 06/16/17 1140 06/16/17 1741 06/16/17 2117 06/17/17 0743  GLUCAP 145* 205* 144* 226* 156*   Lipid Profile: No results for input(s): CHOL, HDL, LDLCALC, TRIG, CHOLHDL, LDLDIRECT in the last 72 hours. Thyroid Function Tests: No results for input(s): TSH, T4TOTAL, FREET4, T3FREE, THYROIDAB in the last 72 hours. Anemia Panel: No results for input(s): VITAMINB12, FOLATE, FERRITIN, TIBC, IRON, RETICCTPCT in the last 72 hours. Urine analysis:    Component Value Date/Time   COLORURINE YELLOW 08/20/2016 0827   APPEARANCEUR HAZY (A) 08/20/2016 0827   LABSPEC 1.010 08/20/2016 0827   LABSPEC 1.010 08/09/2016 1502   PHURINE 5.0 08/20/2016 0827   GLUCOSEU 50 (A) 08/20/2016 0827   GLUCOSEU Negative 08/09/2016 1502   HGBUR NEGATIVE 08/20/2016 0827   BILIRUBINUR NEGATIVE 08/20/2016 0827   BILIRUBINUR Negative 08/09/2016 1502   KETONESUR NEGATIVE 08/20/2016 0827   PROTEINUR NEGATIVE 08/20/2016 0827   UROBILINOGEN 0.2 08/09/2016 1502   NITRITE NEGATIVE 08/20/2016 0827   LEUKOCYTESUR MODERATE (A) 08/20/2016 0827   LEUKOCYTESUR Negative 08/09/2016 1502   Sepsis Labs: @LABRCNTIP (procalcitonin:4,lacticidven:4)  ) Recent Results (from the past 240 hour(s))  MRSA PCR Screening     Status: None   Collection Time: 06/11/17  1:17 AM  Result Value Ref Range Status   MRSA by PCR NEGATIVE NEGATIVE Final    Comment:        The GeneXpert MRSA Assay (FDA approved for NASAL  specimens only), is one component of a comprehensive MRSA colonization surveillance program. It is not intended to diagnose MRSA infection nor to guide or monitor treatment for MRSA infections. Performed at [facility], [place] [address] [place],  [address]       Studies: No results found.  Scheduled Meds: . aspirin EC  81 mg Oral Daily  . dexamethasone  4 mg Intravenous Q6H  . enoxaparin (LOVENOX) injection  40 mg Subcutaneous Daily  . fluticasone  2 spray Each Nare Daily  . insulin aspart  0-15 Units Subcutaneous TID WC  . insulin aspart  0-5 Units Subcutaneous QHS  . insulin aspart  3 Units Subcutaneous TID WC  . insulin glargine  10 Units Subcutaneous BID  . lacosamide  100 mg Oral BID  . levETIRAcetam  1,500 mg Oral BID  .  polyethylene glycol  17 g Oral Daily  . senna-docusate  1 tablet Oral BID    Continuous Infusions:    LOS: 7 days     Kayleen Memos, MD [facility] Pager [phone]  If 7PM-7AM, please contact night-coverage [email or web address] Password [email or web address] 06/17/2017, 2:43 PM     PROGRESS NOTE  CALISTA CRAIN LAG:[id number] DOB: 1965-12-31 DOA: 06/10/2017 PCP: Katherine Roan, MD  HPI/Recap of past 24 hours: Jaime [name] is an 52 y.o. 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 Mickeal Skinner recently neuro oncologist presents with worsening visual impairment and confusion, CT scan of the head showed vasogenic edema with mild left shift hydrocephalus since MRI  06/16/2017: Alert and oriented x 1. No new complaints.   Assessment/Plan: Active Problems:   Diabetes mellitus type 2 with retinopathy ([place])   Essential hypertension   Glioblastoma multiforme of temporal lobe (HCC)   Brain mass   Seizures (HCC)   Acute metabolic encephalopathy   Altered mental status   DNR (do not resuscitate)   Pressure injury of skin  Acute  metabolic encephalopathy, improving Most likely secondary to glioblastoma multiforme of temporal lobe/seizure disorder Continue Decadron Continue Vimpat Oncology following  Glioblastoma multiforme, stable Oncology following We will consult palliative care team in the morning for goals of care  Seizure disorder, stable Continue Vimpat Continue Keppra  HTN, stable Blood pressures well controlled Continue home medications  DM2, stable Continue insulin sliding scale   Code Status: DNR  Family Communication: Mother at bedside  Disposition Plan: SNF/home when clinically stable   Consultants: Oncology  Procedures:  None  Antimicrobials:  None  DVT prophylaxis:   Subcu Lovenox  Objective: Vitals:   06/16/17 1448 06/16/17 2118 06/17/17 0512 06/17/17 1401  BP: 117/86 128/90 (!) 132/96 123/85  Pulse: (!) 108 (!) 102 74 (!) 109  Resp: 16 14 14 16   Temp: 98.5 F (36.9 C) 98.7 F (37.1 C) 98.3 F (36.8 C) 98.6 F (37 C)  TempSrc: Oral Oral Oral Oral  SpO2: 100% 98% 98% 99%  Weight:      Height:        Intake/Output Summary (Last 24 hours) at 06/17/2017 1443 Last data filed at 06/17/2017 1441 Gross per 24 hour  Intake 1800 ml  Output 1150 ml  Net 650 ml   Filed Weights   06/10/17 1550 06/11/17 0115 06/13/17 0443  Weight: 64 kg (141 lb) 62.5 kg (137 lb 12.6 oz) 66.4 kg (146 lb 6.2 oz)    Exam: 06/16/17  . General: 52 y.o. year-old female WD wN NAD A&O x 3 . Cardiovascular: RRR no rubs or gallops. No JVD or thyromegaly. Marland Kitchen Respiratory: Clear to auscultation with no wheezes or rales. Good inspiratory effort. . Abdomen: Soft nontender nondistended with normal bowel sounds x4 quadrants. . Musculoskeletal: Trace edema lower extremities bilaterally.  Skin: No ulcerative lesions noted or rashes, . Psychiatry: Unable to assess due to somnolence.   Data Reviewed: CBC: Recent Labs  Lab 06/10/17 1612 06/10/17 2352 06/13/17 1036 06/14/17 1048  WBC 15.5*  11.0* 12.7* 9.9  NEUTROABS 14.5*  --   --   --   HGB 13.2 12.1 12.5 12.9  HCT 39.6 36.6 36.8 38.4  MCV 88.8 87.8 87.4 87.1  PLT 257 237 253 122   Basic Metabolic Panel: Recent Labs  Lab 06/10/17 1612 06/10/17 2352 06/13/17 1036 06/15/17 1009 06/17/17 0512  NA 139  --  138  139  --   K 4.7  --  3.4* 3.6  --   CL 101  --  103 101  --   CO2 27  --  21* 27  --   GLUCOSE 175*  --  287* 198*  --   BUN 11  --  11 12  --   CREATININE 0.58 0.48 0.54 0.46 0.39*  CALCIUM 9.4  --  9.2 9.6  --    GFR: Estimated Creatinine Clearance: 75.2 mL/min (A) (by C-G formula based on SCr of 0.39 mg/dL (L)). Liver Function Tests: Recent Labs  Lab 06/10/17 1612 06/13/17 1036  AST 32 61*  ALT 16 58*  ALKPHOS 39 52  BILITOT 1.3* 0.4  PROT 6.3* 6.0*  ALBUMIN 3.5 3.1*   No results for input(s): LIPASE, AMYLASE in the last 168 hours. No results for input(s): AMMONIA in the last 168 hours. Coagulation Profile: No results for input(s): INR, PROTIME in the last 168 hours. Cardiac Enzymes: Recent Labs  Lab 06/11/17 0941  CKTOTAL 19*   BNP (last 3 results) No results for input(s): PROBNP in the last 8760 hours. HbA1C: No results for input(s): HGBA1C in the last 72 hours. CBG: Recent Labs  Lab 06/16/17 0746 06/16/17 1140 06/16/17 1741 06/16/17 2117 06/17/17 0743  GLUCAP 145* 205* 144* 226* 156*   Lipid Profile: No results for input(s): CHOL, HDL, LDLCALC, TRIG, CHOLHDL, LDLDIRECT in the last 72 hours. Thyroid Function Tests: No results for input(s): TSH, T4TOTAL, FREET4, T3FREE, THYROIDAB in the last 72 hours. Anemia Panel: No results for input(s): VITAMINB12, FOLATE, FERRITIN, TIBC, IRON, RETICCTPCT in the last 72 hours. Urine analysis:    Component Value Date/Time   COLORURINE YELLOW 08/20/2016 0827   APPEARANCEUR HAZY (A) 08/20/2016 0827   LABSPEC 1.010 08/20/2016 0827   LABSPEC 1.010 08/09/2016 1502   PHURINE 5.0 08/20/2016 0827   GLUCOSEU 50 (A) 08/20/2016 0827   GLUCOSEU  Negative 08/09/2016 1502   HGBUR NEGATIVE 08/20/2016 0827   BILIRUBINUR NEGATIVE 08/20/2016 0827   BILIRUBINUR Negative 08/09/2016 1502   KETONESUR NEGATIVE 08/20/2016 0827   PROTEINUR NEGATIVE 08/20/2016 0827   UROBILINOGEN 0.2 08/09/2016 1502   NITRITE NEGATIVE 08/20/2016 0827   LEUKOCYTESUR MODERATE (A) 08/20/2016 0827   LEUKOCYTESUR Negative 08/09/2016 1502   Sepsis Labs: @LABRCNTIP (procalcitonin:4,lacticidven:4)  ) Recent Results (from the past 240 hour(s))  MRSA PCR Screening     Status: None   Collection Time: 06/11/17  1:17 AM  Result Value Ref Range Status   MRSA by PCR NEGATIVE NEGATIVE Final    Comment:        The GeneXpert MRSA Assay (FDA approved for NASAL specimens only), is one component of a comprehensive MRSA colonization surveillance program. It is not intended to diagnose MRSA infection nor to guide or monitor treatment for MRSA infections. Performed at [facility], [place] [address], [place], [place] [address]       Studies: No results found.  Scheduled Meds: . aspirin EC  81 mg Oral Daily  . dexamethasone  4 mg Intravenous Q6H  . enoxaparin (LOVENOX) injection  40 mg Subcutaneous Daily  . fluticasone  2 spray Each Nare Daily  . insulin aspart  0-15 Units Subcutaneous TID WC  . insulin aspart  0-5 Units Subcutaneous QHS  . insulin aspart  3 Units Subcutaneous TID WC  . insulin glargine  10 Units Subcutaneous BID  . lacosamide  100 mg Oral BID  . levETIRAcetam  1,500 mg Oral BID  . polyethylene  glycol  17 g Oral Daily  . senna-docusate  1 tablet Oral BID    Continuous Infusions:    LOS: 7 days     Kayleen Memos, MD [facility] Pager [phone]  If 7PM-7AM, please contact night-coverage [email or web address] Password [facility] 06/17/2017, 2:43 PM

## 2017-06-18 ENCOUNTER — Ambulatory Visit (HOSPITAL_COMMUNITY): Payer: Medicaid Other

## 2017-06-18 LAB — GLUCOSE, CAPILLARY
Glucose-Capillary: 142 mg/dL — ABNORMAL HIGH (ref 65–99)
Glucose-Capillary: 160 mg/dL — ABNORMAL HIGH (ref 65–99)
Glucose-Capillary: 115 mg/dL — ABNORMAL HIGH (ref 65–99)
Glucose-Capillary: 161 mg/dL — ABNORMAL HIGH (ref 65–99)

## 2017-06-18 NOTE — Progress Notes (Signed)
PROGRESS NOTE  Jaime [name] LFY:[id number] DOB: 1965-03-28 DOA: 06/10/2017 PCP: Katherine Roan, MD  HPI/Recap of past 24 hours: Jaime [name] is an 52 y.o. 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 Mickeal Skinner recently neuro oncologist presents with worsening visual impairment and confusion, CT scan of the head showed vasogenic edema with mild left shift hydrocephalus since MRI.  06/17/17: No new complaints. Awaiting placement to SNF.  06/18/17: no events overnight. No new complaints. Awaiting placement.  Assessment/Plan: Active Problems:   Diabetes mellitus type 2 with retinopathy ([place])   Essential hypertension   Glioblastoma multiforme of temporal lobe (HCC)   Brain mass   Seizures (HCC)   Acute metabolic encephalopathy   Altered mental status   DNR (do not resuscitate)   Pressure injury of skin  Acute metabolic encephalopathy most likely 2/2 to cerebral edema from worsening glioblastoma, improving Mother states not at baseline but more alert C/w decadron while in the hospital Continue Vimpat and keppra Oncology following  Glioblastoma multiforme Personally reviewed CT head done on 06/10/17 with mother in the room which revealed cerebral edema at left temporal lobe at skite of tumor and left to right shift Palliative consult for goals of care and symptoms management Oncology following  Possible CAP, POA completed IV ceftriaxone and IV azithromycin Afebrile procalcitonin less than 0.10  Seizures, stable Continue Vimpat Continue Keppra  HTN, stable Blood pressures well controlled Continue home medications  DM2, stable Continue insulin sliding scale  Physical debility, stable PT recommends HHPT   Code Status: DNR  Family Communication: Mother at bedside  Disposition Plan: SNF/home when clinically  stable   Consultants: Oncology  Procedures:  None  Antimicrobials:  none  DVT prophylaxis:   Subcu Lovenox  Objective: Vitals:   06/17/17 1401 06/17/17 2048 06/18/17 0638 06/18/17 1424  BP: 123/85 111/86 124/85 127/88  Pulse: (!) 109 (!) 115 100 (!) 117  Resp: 16 16 18 16   Temp: 98.6 F (37 C) 98.5 F (36.9 C) (!) 97.5 F (36.4 C) 98.4 F (36.9 C)  TempSrc: Oral Oral Oral Oral  SpO2: 99% 97% 98% 99%  Weight:      Height:        Intake/Output Summary (Last 24 hours) at 06/18/2017 1612 Last data filed at 06/18/2017 0644 Gross per 24 hour  Intake -  Output 600 ml  Net -600 ml   Filed Weights   06/10/17 1550 06/11/17 0115 06/13/17 0443  Weight: 64 kg (141 lb) 62.5 kg (137 lb 12.6 oz) 66.4 kg (146 lb 6.2 oz)    Exam: 06/17/17  . General: 52 y.o. year-old female WD WN NAD A&O x 1 . Cardiovascular:RRR no rubs or gallops. No JVD or thyromegaly. Marland Kitchen Respiratory: Clear to auscultation with no wheezes or rales. Good inspiratory effort. . Abdomen: Soft nontender nondistended with normal bowel sounds x4 quadrants. . Musculoskeletal: Trace edema lower extremities bilaterally.  Skin: No ulcerative lesions noted or rashes, . Psychiatry: Mood is appropriate for condition and setting.   Data Reviewed: CBC: Recent Labs  Lab 06/13/17 1036 06/14/17 1048  WBC 12.7* 9.9  HGB 12.5 12.9  HCT 36.8 38.4  MCV 87.4 87.1  PLT 253 852   Basic Metabolic Panel: Recent Labs  Lab 06/13/17 1036 06/15/17 1009 06/17/17 0512  NA 138 139  --   K 3.4* 3.6  --   CL 103 101  --   CO2 21* 27  --  GLUCOSE 287* 198*  --   BUN 11 12  --   CREATININE 0.54 0.46 0.39*  CALCIUM 9.2 9.6  --    GFR: Estimated Creatinine Clearance: 75.2 mL/min (A) (by C-G formula based on SCr of 0.39 mg/dL (L)). Liver Function Tests: Recent Labs  Lab 06/13/17 1036  AST 61*  ALT 58*  ALKPHOS 52  BILITOT 0.4  PROT 6.0*  ALBUMIN 3.1*   No results for input(s): LIPASE, AMYLASE in the last 168  hours. No results for input(s): AMMONIA in the last 168 hours. Coagulation Profile: No results for input(s): INR, PROTIME in the last 168 hours. Cardiac Enzymes: No results for input(s): CKTOTAL, CKMB, CKMBINDEX, TROPONINI in the last 168 hours. BNP (last 3 results) No results for input(s): PROBNP in the last 8760 hours. HbA1C: No results for input(s): HGBA1C in the last 72 hours. CBG: Recent Labs  Lab 06/16/17 2117 06/17/17 0743 06/17/17 2039 06/18/17 0749 06/18/17 1243  GLUCAP 226* 156* 207* 142* 160*   Lipid Profile: No results for input(s): CHOL, HDL, LDLCALC, TRIG, CHOLHDL, LDLDIRECT in the last 72 hours. Thyroid Function Tests: No results for input(s): TSH, T4TOTAL, FREET4, T3FREE, THYROIDAB in the last 72 hours. Anemia Panel: No results for input(s): VITAMINB12, FOLATE, FERRITIN, TIBC, IRON, RETICCTPCT in the last 72 hours. Urine analysis:    Component Value Date/Time   COLORURINE YELLOW 08/20/2016 0827   APPEARANCEUR HAZY (A) 08/20/2016 0827   LABSPEC 1.010 08/20/2016 0827   LABSPEC 1.010 08/09/2016 1502   PHURINE 5.0 08/20/2016 0827   GLUCOSEU 50 (A) 08/20/2016 0827   GLUCOSEU Negative 08/09/2016 1502   HGBUR NEGATIVE 08/20/2016 0827   BILIRUBINUR NEGATIVE 08/20/2016 0827   BILIRUBINUR Negative 08/09/2016 1502   KETONESUR NEGATIVE 08/20/2016 0827   PROTEINUR NEGATIVE 08/20/2016 0827   UROBILINOGEN 0.2 08/09/2016 1502   NITRITE NEGATIVE 08/20/2016 0827   LEUKOCYTESUR MODERATE (A) 08/20/2016 0827   LEUKOCYTESUR Negative 08/09/2016 1502   Sepsis Labs: @LABRCNTIP (procalcitonin:4,lacticidven:4)  ) Recent Results (from the past 240 hour(s))  MRSA PCR Screening     Status: None   Collection Time: 06/11/17  1:17 AM  Result Value Ref Range Status   MRSA by PCR NEGATIVE NEGATIVE Final    Comment:        The GeneXpert MRSA Assay (FDA approved for NASAL specimens only), is one component of a comprehensive MRSA colonization surveillance program. It is  not intended to diagnose MRSA infection nor to guide or monitor treatment for MRSA infections. Performed at [facility], [place] [address] [place], [place] [address]       Studies: No results found.  Scheduled Meds: . aspirin EC  81 mg Oral Daily  . dexamethasone  4 mg Intravenous Q6H  . enoxaparin (LOVENOX) injection  40 mg Subcutaneous Daily  . fluticasone  2 spray Each Nare Daily  . insulin aspart  0-15 Units Subcutaneous TID WC  . insulin aspart  0-5 Units Subcutaneous QHS  . insulin aspart  3 Units Subcutaneous TID WC  . insulin glargine  10 Units Subcutaneous BID  . lacosamide  100 mg Oral BID  . levETIRAcetam  1,500 mg Oral BID  . polyethylene glycol  17 g Oral Daily  . senna-docusate  1 tablet Oral BID    Continuous Infusions:    LOS: 8 days     Kayleen Memos, MD [facility] Pager [phone]  If 7PM-7AM, please contact night-coverage [email or web address] Password [email or web address] 06/18/2017, 4:12 PM     PROGRESS NOTE  Jaime [name]  KAMORIE ALDOUS JSE:[id number] DOB: Aug 03, 1965 DOA: 06/10/2017 PCP: Katherine Roan, MD  HPI/Recap of past 24 hours: Jaime [name] is an 52 y.o. 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 Mickeal Skinner recently neuro oncologist presents with worsening visual impairment and confusion, CT scan of the head showed vasogenic edema with mild left shift hydrocephalus since MRI  06/16/2017: Alert and oriented x 1. No new complaints.   Assessment/Plan: Active Problems:   Diabetes mellitus type 2 with retinopathy ([place])   Essential hypertension   Glioblastoma multiforme of temporal lobe (HCC)   Brain mass   Seizures (HCC)   Acute metabolic encephalopathy   Altered mental status   DNR (do not resuscitate)   Pressure injury of skin  Acute metabolic encephalopathy, improving Most likely secondary to glioblastoma multiforme of temporal  lobe/seizure disorder Continue Decadron Continue Vimpat Oncology following  Glioblastoma multiforme, stable Oncology following We will consult palliative care team in the morning for goals of care  Seizure disorder, stable Continue Vimpat Continue Keppra  HTN, stable Blood pressures well controlled Continue home medications  DM2, stable Continue insulin sliding scale   Code Status: DNR  Family Communication: Mother at bedside  Disposition Plan: SNF/home when clinically stable   Consultants: Oncology  Procedures:  None  Antimicrobials:  None  DVT prophylaxis:   Subcu Lovenox  Objective: Vitals:   06/17/17 1401 06/17/17 2048 06/18/17 0638 06/18/17 1424  BP: 123/85 111/86 124/85 127/88  Pulse: (!) 109 (!) 115 100 (!) 117  Resp: 16 16 18 16   Temp: 98.6 F (37 C) 98.5 F (36.9 C) (!) 97.5 F (36.4 C) 98.4 F (36.9 C)  TempSrc: Oral Oral Oral Oral  SpO2: 99% 97% 98% 99%  Weight:      Height:        Intake/Output Summary (Last 24 hours) at 06/18/2017 1612 Last data filed at 06/18/2017 0644 Gross per 24 hour  Intake -  Output 600 ml  Net -600 ml   Filed Weights   06/10/17 1550 06/11/17 0115 06/13/17 0443  Weight: 64 kg (141 lb) 62.5 kg (137 lb 12.6 oz) 66.4 kg (146 lb 6.2 oz)    Exam: 06/16/17  . General: 52 y.o. year-old female WD wN NAD A&O x 3 . Cardiovascular: RRR no rubs or gallops. No JVD or thyromegaly. Marland Kitchen Respiratory: Clear to auscultation with no wheezes or rales. Good inspiratory effort. . Abdomen: Soft nontender nondistended with normal bowel sounds x4 quadrants. . Musculoskeletal: Trace edema lower extremities bilaterally.  Skin: No ulcerative lesions noted or rashes, . Psychiatry: Unable to assess due to somnolence.   Data Reviewed: CBC: Recent Labs  Lab 06/13/17 1036 06/14/17 1048  WBC 12.7* 9.9  HGB 12.5 12.9  HCT 36.8 38.4  MCV 87.4 87.1  PLT 253 073   Basic Metabolic Panel: Recent Labs  Lab 06/13/17 1036  06/15/17 1009 06/17/17 0512  NA 138 139  --   K 3.4* 3.6  --   CL 103 101  --   CO2 21* 27  --   GLUCOSE 287* 198*  --   BUN 11 12  --   CREATININE 0.54 0.46 0.39*  CALCIUM 9.2 9.6  --    GFR: Estimated Creatinine Clearance: 75.2 mL/min (A) (by C-G formula based on SCr of 0.39 mg/dL (L)). Liver Function Tests: Recent Labs  Lab 06/13/17 1036  AST 61*  ALT 58*  ALKPHOS 52  BILITOT 0.4  PROT  6.0*  ALBUMIN 3.1*   No results for input(s): LIPASE, AMYLASE in the last 168 hours. No results for input(s): AMMONIA in the last 168 hours. Coagulation Profile: No results for input(s): INR, PROTIME in the last 168 hours. Cardiac Enzymes: No results for input(s): CKTOTAL, CKMB, CKMBINDEX, TROPONINI in the last 168 hours. BNP (last 3 results) No results for input(s): PROBNP in the last 8760 hours. HbA1C: No results for input(s): HGBA1C in the last 72 hours. CBG: Recent Labs  Lab 06/16/17 2117 06/17/17 0743 06/17/17 2039 06/18/17 0749 06/18/17 1243  GLUCAP 226* 156* 207* 142* 160*   Lipid Profile: No results for input(s): CHOL, HDL, LDLCALC, TRIG, CHOLHDL, LDLDIRECT in the last 72 hours. Thyroid Function Tests: No results for input(s): TSH, T4TOTAL, FREET4, T3FREE, THYROIDAB in the last 72 hours. Anemia Panel: No results for input(s): VITAMINB12, FOLATE, FERRITIN, TIBC, IRON, RETICCTPCT in the last 72 hours. Urine analysis:    Component Value Date/Time   COLORURINE YELLOW 08/20/2016 0827   APPEARANCEUR HAZY (A) 08/20/2016 0827   LABSPEC 1.010 08/20/2016 0827   LABSPEC 1.010 08/09/2016 1502   PHURINE 5.0 08/20/2016 0827   GLUCOSEU 50 (A) 08/20/2016 0827   GLUCOSEU Negative 08/09/2016 1502   HGBUR NEGATIVE 08/20/2016 0827   BILIRUBINUR NEGATIVE 08/20/2016 0827   BILIRUBINUR Negative 08/09/2016 1502   KETONESUR NEGATIVE 08/20/2016 0827   PROTEINUR NEGATIVE 08/20/2016 0827   UROBILINOGEN 0.2 08/09/2016 1502   NITRITE NEGATIVE 08/20/2016 0827   LEUKOCYTESUR MODERATE (A)  08/20/2016 0827   LEUKOCYTESUR Negative 08/09/2016 1502   Sepsis Labs: @LABRCNTIP (procalcitonin:4,lacticidven:4)  ) Recent Results (from the past 240 hour(s))  MRSA PCR Screening     Status: None   Collection Time: 06/11/17  1:17 AM  Result Value Ref Range Status   MRSA by PCR NEGATIVE NEGATIVE Final    Comment:        The GeneXpert MRSA Assay (FDA approved for NASAL specimens only), is one component of a comprehensive MRSA colonization surveillance program. It is not intended to diagnose MRSA infection nor to guide or monitor treatment for MRSA infections. Performed at [facility], [place] [address], [place], [place] [address]       Studies: No results found.  Scheduled Meds: . aspirin EC  81 mg Oral Daily  . dexamethasone  4 mg Intravenous Q6H  . enoxaparin (LOVENOX) injection  40 mg Subcutaneous Daily  . fluticasone  2 spray Each Nare Daily  . insulin aspart  0-15 Units Subcutaneous TID WC  . insulin aspart  0-5 Units Subcutaneous QHS  . insulin aspart  3 Units Subcutaneous TID WC  . insulin glargine  10 Units Subcutaneous BID  . lacosamide  100 mg Oral BID  . levETIRAcetam  1,500 mg Oral BID  . polyethylene glycol  17 g Oral Daily  . senna-docusate  1 tablet Oral BID    Continuous Infusions:    LOS: 8 days     Kayleen Memos, MD [facility] Pager [phone]  If 7PM-7AM, please contact night-coverage [email or web address] Password [email or web address] 06/18/2017, 4:12 PM     PROGRESS NOTE  Jaime [name] AST:[id number] DOB: Apr 13, 1965 DOA: 06/10/2017 PCP: Katherine Roan, MD  HPI/Recap of past 24 hours: Jaime [name] is an 52 y.o. 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 Mickeal Skinner recently neuro oncologist presents with worsening visual impairment and confusion, CT scan of the head showed vasogenic edema with mild left shift  hydrocephalus since  MRI.  06/17/17: No new complaints. Awaiting placement to SNF.  Assessment/Plan: Active Problems:   Diabetes mellitus type 2 with retinopathy ([place])   Essential hypertension   Glioblastoma multiforme of temporal lobe (HCC)   Brain mass   Seizures (HCC)   Acute metabolic encephalopathy   Altered mental status   DNR (do not resuscitate)   Pressure injury of skin  Acute metabolic encephalopathy most likely 2/2 to cerebral edema from worsening glioblastoma, improving Mother states not at baseline but more alert C/w decadron while in the hospital Continue Vimpat and keppra Oncology following  Glioblastoma multiforme Personally reviewed CT head done on 06/10/17 with mother in the room which revealed cerebral edema at left temporal lobe at skite of tumor and left to right shift Palliative consult for goals of care and symptoms management Oncology following  Possible CAP, POA completed IV ceftriaxone and IV azithromycin Afebrile procalcitonin less than 0.10  Seizures, stable Continue Vimpat Continue Keppra  HTN, stable Blood pressures well controlled Continue home medications  DM2, stable Continue insulin sliding scale  Physical debility, stable PT recommends HHPT   Code Status: DNR  Family Communication: Mother at bedside  Disposition Plan: SNF/home when clinically stable   Consultants: Oncology  Procedures:  None  Antimicrobials:  none  DVT prophylaxis:   Subcu Lovenox  Objective: Vitals:   06/17/17 1401 06/17/17 2048 06/18/17 0638 06/18/17 1424  BP: 123/85 111/86 124/85 127/88  Pulse: (!) 109 (!) 115 100 (!) 117  Resp: 16 16 18 16   Temp: 98.6 F (37 C) 98.5 F (36.9 C) (!) 97.5 F (36.4 C) 98.4 F (36.9 C)  TempSrc: Oral Oral Oral Oral  SpO2: 99% 97% 98% 99%  Weight:      Height:        Intake/Output Summary (Last 24 hours) at 06/18/2017 1613 Last data filed at 06/18/2017 0644 Gross per 24 hour  Intake -  Output 600 ml  Net -600 ml    Filed Weights   06/10/17 1550 06/11/17 0115 06/13/17 0443  Weight: 64 kg (141 lb) 62.5 kg (137 lb 12.6 oz) 66.4 kg (146 lb 6.2 oz)    Exam: 06/17/17  . General: 52 y.o. year-old female WD WN NAD A&O x 1 . Cardiovascular:RRR no rubs or gallops. No JVD or thyromegaly. Marland Kitchen Respiratory: Clear to auscultation with no wheezes or rales. Good inspiratory effort. . Abdomen: Soft nontender nondistended with normal bowel sounds x4 quadrants. . Musculoskeletal: Trace edema lower extremities bilaterally.  Skin: No ulcerative lesions noted or rashes, . Psychiatry: Mood is appropriate for condition and setting.   Data Reviewed: CBC: Recent Labs  Lab 06/13/17 1036 06/14/17 1048  WBC 12.7* 9.9  HGB 12.5 12.9  HCT 36.8 38.4  MCV 87.4 87.1  PLT 253 782   Basic Metabolic Panel: Recent Labs  Lab 06/13/17 1036 06/15/17 1009 06/17/17 0512  NA 138 139  --   K 3.4* 3.6  --   CL 103 101  --   CO2 21* 27  --   GLUCOSE 287* 198*  --   BUN 11 12  --   CREATININE 0.54 0.46 0.39*  CALCIUM 9.2 9.6  --    GFR: Estimated Creatinine Clearance: 75.2 mL/min (A) (by C-G formula based on SCr of 0.39 mg/dL (L)). Liver Function Tests: Recent Labs  Lab 06/13/17 1036  AST 61*  ALT 58*  ALKPHOS 52  BILITOT 0.4  PROT 6.0*  ALBUMIN 3.1*   No results for input(s): LIPASE, AMYLASE  in the last 168 hours. No results for input(s): AMMONIA in the last 168 hours. Coagulation Profile: No results for input(s): INR, PROTIME in the last 168 hours. Cardiac Enzymes: No results for input(s): CKTOTAL, CKMB, CKMBINDEX, TROPONINI in the last 168 hours. BNP (last 3 results) No results for input(s): PROBNP in the last 8760 hours. HbA1C: No results for input(s): HGBA1C in the last 72 hours. CBG: Recent Labs  Lab 06/16/17 2117 06/17/17 0743 06/17/17 2039 06/18/17 0749 06/18/17 1243  GLUCAP 226* 156* 207* 142* 160*   Lipid Profile: No results for input(s): CHOL, HDL, LDLCALC, TRIG, CHOLHDL, LDLDIRECT in the  last 72 hours. Thyroid Function Tests: No results for input(s): TSH, T4TOTAL, FREET4, T3FREE, THYROIDAB in the last 72 hours. Anemia Panel: No results for input(s): VITAMINB12, FOLATE, FERRITIN, TIBC, IRON, RETICCTPCT in the last 72 hours. Urine analysis:    Component Value Date/Time   COLORURINE YELLOW 08/20/2016 0827   APPEARANCEUR HAZY (A) 08/20/2016 0827   LABSPEC 1.010 08/20/2016 0827   LABSPEC 1.010 08/09/2016 1502   PHURINE 5.0 08/20/2016 0827   GLUCOSEU 50 (A) 08/20/2016 0827   GLUCOSEU Negative 08/09/2016 1502   HGBUR NEGATIVE 08/20/2016 0827   BILIRUBINUR NEGATIVE 08/20/2016 0827   BILIRUBINUR Negative 08/09/2016 1502   KETONESUR NEGATIVE 08/20/2016 0827   PROTEINUR NEGATIVE 08/20/2016 0827   UROBILINOGEN 0.2 08/09/2016 1502   NITRITE NEGATIVE 08/20/2016 0827   LEUKOCYTESUR MODERATE (A) 08/20/2016 0827   LEUKOCYTESUR Negative 08/09/2016 1502   Sepsis Labs: @LABRCNTIP (procalcitonin:4,lacticidven:4)  ) Recent Results (from the past 240 hour(s))  MRSA PCR Screening     Status: None   Collection Time: 06/11/17  1:17 AM  Result Value Ref Range Status   MRSA by PCR NEGATIVE NEGATIVE Final    Comment:        The GeneXpert MRSA Assay (FDA approved for NASAL specimens only), is one component of a comprehensive MRSA colonization surveillance program. It is not intended to diagnose MRSA infection nor to guide or monitor treatment for MRSA infections. Performed at [facility], [place] [address] [place], [place] [address]       Studies: No results found.  Scheduled Meds: . aspirin EC  81 mg Oral Daily  . dexamethasone  4 mg Intravenous Q6H  . enoxaparin (LOVENOX) injection  40 mg Subcutaneous Daily  . fluticasone  2 spray Each Nare Daily  . insulin aspart  0-15 Units Subcutaneous TID WC  . insulin aspart  0-5 Units Subcutaneous QHS  . insulin aspart  3 Units Subcutaneous TID WC  . insulin glargine  10 Units Subcutaneous BID  . lacosamide   100 mg Oral BID  . levETIRAcetam  1,500 mg Oral BID  . polyethylene glycol  17 g Oral Daily  . senna-docusate  1 tablet Oral BID    Continuous Infusions:    LOS: 8 days     Kayleen Memos, MD [facility] Pager [phone]  If 7PM-7AM, please contact night-coverage [email or web address] Password [email or web address] 06/18/2017, 4:13 PM     PROGRESS NOTE  Jaime [name] JKD:[id number] DOB: [birth year] DOA: 06/10/2017 PCP: Katherine Roan, MD  HPI/Recap of past 24 hours: Jaime [name] is an 52 y.o. 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 Mickeal Skinner recently neuro oncologist presents with worsening visual impairment and confusion, CT scan of the head showed vasogenic edema with mild left shift hydrocephalus since MRI  06/16/2017: Alert and oriented x 1. No  new complaints.   Assessment/Plan: Active Problems:   Diabetes mellitus type 2 with retinopathy ([place])   Essential hypertension   Glioblastoma multiforme of temporal lobe (HCC)   Brain mass   Seizures (HCC)   Acute metabolic encephalopathy   Altered mental status   DNR (do not resuscitate)   Pressure injury of skin  Acute metabolic encephalopathy, improving Most likely secondary to glioblastoma multiforme of temporal lobe/seizure disorder Continue Decadron Continue Vimpat Oncology following  Glioblastoma multiforme, stable Oncology following We will consult palliative care team in the morning for goals of care  Seizure disorder, stable Continue Vimpat Continue Keppra  HTN, stable Blood pressures well controlled Continue home medications  DM2, stable Continue insulin sliding scale   Code Status: DNR  Family Communication: Mother at bedside  Disposition Plan: SNF/home when clinically stable   Consultants: Oncology  Procedures:  None  Antimicrobials:  None  DVT prophylaxis:   Subcu  Lovenox  Objective: Vitals:   06/17/17 1401 06/17/17 2048 06/18/17 0638 06/18/17 1424  BP: 123/85 111/86 124/85 127/88  Pulse: (!) 109 (!) 115 100 (!) 117  Resp: 16 16 18 16   Temp: 98.6 F (37 C) 98.5 F (36.9 C) (!) 97.5 F (36.4 C) 98.4 F (36.9 C)  TempSrc: Oral Oral Oral Oral  SpO2: 99% 97% 98% 99%  Weight:      Height:        Intake/Output Summary (Last 24 hours) at 06/18/2017 1613 Last data filed at 06/18/2017 0644 Gross per 24 hour  Intake -  Output 600 ml  Net -600 ml   Filed Weights   06/10/17 1550 06/11/17 0115 06/13/17 0443  Weight: 64 kg (141 lb) 62.5 kg (137 lb 12.6 oz) 66.4 kg (146 lb 6.2 oz)    Exam: 06/18/17. No change in physical exam.  . General: 53 y.o. year-old female WD wN NAD A&O x 3 . Cardiovascular: RRR no rubs or gallops. No JVD or thyromegaly. Marland Kitchen Respiratory: Clear to auscultation with no wheezes or rales. Good inspiratory effort. . Abdomen: Soft nontender nondistended with normal bowel sounds x4 quadrants. . Musculoskeletal: Trace edema lower extremities bilaterally.  Skin: No ulcerative lesions noted or rashes, . Psychiatry: Unable to assess due to somnolence.   Data Reviewed: CBC: Recent Labs  Lab 06/13/17 1036 06/14/17 1048  WBC 12.7* 9.9  HGB 12.5 12.9  HCT 36.8 38.4  MCV 87.4 87.1  PLT 253 629   Basic Metabolic Panel: Recent Labs  Lab 06/13/17 1036 06/15/17 1009 06/17/17 0512  NA 138 139  --   K 3.4* 3.6  --   CL 103 101  --   CO2 21* 27  --   GLUCOSE 287* 198*  --   BUN 11 12  --   CREATININE 0.54 0.46 0.39*  CALCIUM 9.2 9.6  --    GFR: Estimated Creatinine Clearance: 75.2 mL/min (A) (by C-G formula based on SCr of 0.39 mg/dL (L)). Liver Function Tests: Recent Labs  Lab 06/13/17 1036  AST 61*  ALT 58*  ALKPHOS 52  BILITOT 0.4  PROT 6.0*  ALBUMIN 3.1*   No results for input(s): LIPASE, AMYLASE in the last 168 hours. No results for input(s): AMMONIA in the last 168 hours. Coagulation Profile: No results for  input(s): INR, PROTIME in the last 168 hours. Cardiac Enzymes: No results for input(s): CKTOTAL, CKMB, CKMBINDEX, TROPONINI in the last 168 hours. BNP (last 3 results) No results for input(s): PROBNP in the last 8760 hours. HbA1C: No results for input(s):  HGBA1C in the last 72 hours. CBG: Recent Labs  Lab 06/16/17 2117 06/17/17 0743 06/17/17 2039 06/18/17 0749 06/18/17 1243  GLUCAP 226* 156* 207* 142* 160*   Lipid Profile: No results for input(s): CHOL, HDL, LDLCALC, TRIG, CHOLHDL, LDLDIRECT in the last 72 hours. Thyroid Function Tests: No results for input(s): TSH, T4TOTAL, FREET4, T3FREE, THYROIDAB in the last 72 hours. Anemia Panel: No results for input(s): VITAMINB12, FOLATE, FERRITIN, TIBC, IRON, RETICCTPCT in the last 72 hours. Urine analysis:    Component Value Date/Time   COLORURINE YELLOW 08/20/2016 0827   APPEARANCEUR HAZY (A) 08/20/2016 0827   LABSPEC 1.010 08/20/2016 0827   LABSPEC 1.010 08/09/2016 1502   PHURINE 5.0 08/20/2016 0827   GLUCOSEU 50 (A) 08/20/2016 0827   GLUCOSEU Negative 08/09/2016 1502   HGBUR NEGATIVE 08/20/2016 0827   BILIRUBINUR NEGATIVE 08/20/2016 0827   BILIRUBINUR Negative 08/09/2016 1502   KETONESUR NEGATIVE 08/20/2016 0827   PROTEINUR NEGATIVE 08/20/2016 0827   UROBILINOGEN 0.2 08/09/2016 1502   NITRITE NEGATIVE 08/20/2016 0827   LEUKOCYTESUR MODERATE (A) 08/20/2016 0827   LEUKOCYTESUR Negative 08/09/2016 1502   Sepsis Labs: @[email or web address] (procalcitonin:4,lacticidven:4)  ) Recent Results (from the past 240 hour(s))  MRSA PCR Screening     Status: None   Collection Time: 06/11/17  1:17 AM  Result Value Ref Range Status   MRSA by PCR NEGATIVE NEGATIVE Final    Comment:        The GeneXpert MRSA Assay (FDA approved for NASAL specimens only), is one component of a comprehensive MRSA colonization surveillance program. It is not intended to diagnose MRSA infection nor to guide or monitor treatment for MRSA infections. Performed  at [facility], [place] [address] [place], [place] [address]       Studies: No results found.  Scheduled Meds: . aspirin EC  81 mg Oral Daily  . dexamethasone  4 mg Intravenous Q6H  . enoxaparin (LOVENOX) injection  40 mg Subcutaneous Daily  . fluticasone  2 spray Each Nare Daily  . insulin aspart  0-15 Units Subcutaneous TID WC  . insulin aspart  0-5 Units Subcutaneous QHS  . insulin aspart  3 Units Subcutaneous TID WC  . insulin glargine  10 Units Subcutaneous BID  . lacosamide  100 mg Oral BID  . levETIRAcetam  1,500 mg Oral BID  . polyethylene glycol  17 g Oral Daily  . senna-docusate  1 tablet Oral BID    Continuous Infusions:    LOS: 8 days     Kayleen Memos, MD [facility] Pager [phone]  If 7PM-7AM, please contact night-coverage [email or web address] Password [email or web address] 06/18/2017, 4:13 PM

## 2017-06-19 LAB — CBC
HCT: 41.2 % (ref 36.0–46.0)
Hemoglobin: 14 g/dL (ref 12.0–15.0)
MCH: 29.9 pg (ref 26.0–34.0)
MCHC: 34 g/dL (ref 30.0–36.0)
MCV: 87.8 fL (ref 78.0–100.0)
PLATELETS: 212 10*3/uL (ref 150–400)
RBC: 4.69 MIL/uL (ref 3.87–5.11)
RDW: 13.9 % (ref 11.5–15.5)
WBC: 12.6 10*3/uL — ABNORMAL HIGH (ref 4.0–10.5)

## 2017-06-19 LAB — GLUCOSE, CAPILLARY
GLUCOSE-CAPILLARY: 189 mg/dL — AB (ref 65–99)
GLUCOSE-CAPILLARY: 141 mg/dL — AB (ref 65–99)
Glucose-Capillary: 163 mg/dL — ABNORMAL HIGH (ref 65–99)
Glucose-Capillary: 187 mg/dL — ABNORMAL HIGH (ref 65–99)
Glucose-Capillary: 165 mg/dL — ABNORMAL HIGH (ref 65–99)

## 2017-06-19 LAB — BASIC METABOLIC PANEL
Anion gap: 10 (ref 5–15)
BUN: 16 mg/dL (ref 6–20)
CO2: 27 mmol/L (ref 22–32)
CREATININE: 0.42 mg/dL — AB (ref 0.44–1.00)
Calcium: 9.2 mg/dL (ref 8.9–10.3)
Chloride: 100 mmol/L — ABNORMAL LOW (ref 101–111)
Glucose, Bld: 223 mg/dL — ABNORMAL HIGH (ref 65–99)
POTASSIUM: 4.1 mmol/L (ref 3.5–5.1)
SODIUM: 137 mmol/L (ref 135–145)

## 2017-06-19 NOTE — Progress Notes (Addendum)
PROGRESS NOTE  Jaime [name] IOX:[id number] DOB: 04-10-1965 DOA: 06/10/2017 PCP: Jaime Roan, MD  HPI/Recap of past 24 hours: Jaime [name] is an 52 y.o. 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 Mickeal Skinner recently neuro oncologist presents with worsening visual impairment and confusion, CT scan of the head showed vasogenic edema with mild left shift hydrocephalus since MRI.  06/19/17: No events overnight. No new complaints. Awaiting SNF placement.  Assessment/Plan: Active Problems:   Diabetes mellitus type 2 with retinopathy ([place])   Essential hypertension   Glioblastoma multiforme of temporal lobe (HCC)   Brain mass   Seizures (HCC)   Acute metabolic encephalopathy   Altered mental status   DNR (do not resuscitate)   Pressure injury of skin  Acute metabolic encephalopathy most likely 2/2 to cerebral edema from worsening glioblastoma, improving Mother states not at baseline but more alert C/w decadron while in the hospital Continue Vimpat and keppra Oncology following  Glioblastoma multiforme, stable Personally reviewed CT head done on 06/10/17 with mother in the room which revealed cerebral edema at left temporal lobe at skite of tumor and left to right shift Palliative consult for goals of care and symptoms management Oncology following  Possible CAP completed IV ceftriaxone and IV azithromycin Afebrile procalcitonin less than 0.10  Seizure disorder Continue Vimpat Continue Keppra  HTN Blood pressures well controlled Continue home medications  DM2 Continue insulin sliding scale  Physical debility PT recommends HHPT  Pressure skin wound, poa B/l LE   Code Status: DNR  Family Communication: Mother at bedside  Disposition Plan: SNF/home when clinically stable   Consultants: Oncology  Procedures:  None  Antimicrobials:  none  DVT prophylaxis:     Subcu Lovenox  Objective: Vitals:   06/18/17 1424 06/18/17 2144 06/19/17 0614 06/19/17 1351  BP: 127/88 121/85 124/87 121/79  Pulse: (!) 117 (!) 108 94 (!) 105  Resp: 16 16 16 16   Temp: 98.4 F (36.9 C) 98.4 F (36.9 C) 98.1 F (36.7 C) 98.4 F (36.9 C)  TempSrc: Oral Oral Axillary Oral  SpO2: 99% 99% 97% 99%  Weight:      Height:        Intake/Output Summary (Last 24 hours) at 06/19/2017 2144 Last data filed at 06/19/2017 1734 Gross per 24 hour  Intake 840 ml  Output 900 ml  Net -60 ml   Filed Weights   06/10/17 1550 06/11/17 0115 06/13/17 0443  Weight: 64 kg (141 lb) 62.5 kg (137 lb 12.6 oz) 66.4 kg (146 lb 6.2 oz)    Exam: 06/19/17  . General: 52 y.o. year-old female WD WN NAD A&O x 3 . Cardiovascular:RRR no rubs or gallops. No JVD or thyromegaly. Marland Kitchen Respiratory: Clear to auscultation with no wheezes or rales. Good inspiratory effort. . Abdomen: Soft nontender nondistended with normal bowel sounds x4 quadrants. . Musculoskeletal: Trace edema lower extremities bilaterally.  Skin: No ulcerative lesions noted or rashes, . Psychiatry: Mood is appropriate for condition and setting.   Data Reviewed: CBC: Recent Labs  Lab 06/13/17 1036 06/14/17 1048 06/19/17 0911  WBC 12.7* 9.9 12.6*  HGB 12.5 12.9 14.0  HCT 36.8 38.4 41.2  MCV 87.4 87.1 87.8  PLT 253 237 268   Basic Metabolic Panel: Recent Labs  Lab 06/13/17 1036 06/15/17 1009 06/17/17 0512 06/19/17 0911  NA 138 139  --  137  K 3.4* 3.6  --  4.1  CL 103 101  --  100*  CO2 21* 27  --  27  GLUCOSE 287* 198*  --  223*  BUN 11 12  --  16  CREATININE 0.54 0.46 0.39* 0.42*  CALCIUM 9.2 9.6  --  9.2   GFR: Estimated Creatinine Clearance: 75.2 mL/min (A) (by C-G formula based on SCr of 0.42 mg/dL (L)). Liver Function Tests: Recent Labs  Lab 06/13/17 1036  AST 61*  ALT 58*  ALKPHOS 52  BILITOT 0.4  PROT 6.0*  ALBUMIN 3.1*   No results for input(s): LIPASE, AMYLASE in the last 168 hours. No results  for input(s): AMMONIA in the last 168 hours. Coagulation Profile: No results for input(s): INR, PROTIME in the last 168 hours. Cardiac Enzymes: No results for input(s): CKTOTAL, CKMB, CKMBINDEX, TROPONINI in the last 168 hours. BNP (last 3 results) No results for input(s): PROBNP in the last 8760 hours. HbA1C: No results for input(s): HGBA1C in the last 72 hours. CBG: Recent Labs  Lab 06/18/17 1629 06/18/17 2007 06/19/17 0741 06/19/17 1150 06/19/17 1714  GLUCAP 115* 161* 163* 187* 165*   Lipid Profile: No results for input(s): CHOL, HDL, LDLCALC, TRIG, CHOLHDL, LDLDIRECT in the last 72 hours. Thyroid Function Tests: No results for input(s): TSH, T4TOTAL, FREET4, T3FREE, THYROIDAB in the last 72 hours. Anemia Panel: No results for input(s): VITAMINB12, FOLATE, FERRITIN, TIBC, IRON, RETICCTPCT in the last 72 hours. Urine analysis:    Component Value Date/Time   COLORURINE YELLOW 08/20/2016 0827   APPEARANCEUR HAZY (A) 08/20/2016 0827   LABSPEC 1.010 08/20/2016 0827   LABSPEC 1.010 08/09/2016 1502   PHURINE 5.0 08/20/2016 0827   GLUCOSEU 50 (A) 08/20/2016 0827   GLUCOSEU Negative 08/09/2016 1502   HGBUR NEGATIVE 08/20/2016 0827   BILIRUBINUR NEGATIVE 08/20/2016 0827   BILIRUBINUR Negative 08/09/2016 1502   KETONESUR NEGATIVE 08/20/2016 0827   PROTEINUR NEGATIVE 08/20/2016 0827   UROBILINOGEN 0.2 08/09/2016 1502   NITRITE NEGATIVE 08/20/2016 0827   LEUKOCYTESUR MODERATE (A) 08/20/2016 0827   LEUKOCYTESUR Negative 08/09/2016 1502   Sepsis Labs: @LABRCNTIP (procalcitonin:4,lacticidven:4)  ) Recent Results (from the past 240 hour(s))  MRSA PCR Screening     Status: None   Collection Time: 06/11/17  1:17 AM  Result Value Ref Range Status   MRSA by PCR NEGATIVE NEGATIVE Final    Comment:        The GeneXpert MRSA Assay (FDA approved for NASAL specimens only), is one component of a comprehensive MRSA colonization surveillance program. It is not intended to diagnose  MRSA infection nor to guide or monitor treatment for MRSA infections. Performed at [facility], [place] [address] [place], [place] [address]       Studies: No results found.  Scheduled Meds: . aspirin EC  81 mg Oral Daily  . dexamethasone  4 mg Intravenous Q6H  . enoxaparin (LOVENOX) injection  40 mg Subcutaneous Daily  . fluticasone  2 spray Each Nare Daily  . insulin aspart  0-15 Units Subcutaneous TID WC  . insulin aspart  0-5 Units Subcutaneous QHS  . insulin aspart  3 Units Subcutaneous TID WC  . insulin glargine  10 Units Subcutaneous BID  . lacosamide  100 mg Oral BID  . levETIRAcetam  1,500 mg Oral BID  . polyethylene glycol  17 g Oral Daily  . senna-docusate  1 tablet Oral BID    Continuous Infusions:    LOS: 9 days     Kayleen Memos, MD [facility] Pager [phone]  If 7PM-7AM, please contact night-coverage  [email or web address] Password [email or web address] 06/19/2017, 9:44 PM     PROGRESS NOTE  Jaime [name] OIN:[id number] DOB: 04-14-1965 DOA: 06/10/2017 PCP: Jaime Roan, MD  HPI/Recap of past 24 hours: Jaime [name] is an 52 y.o. 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 Mickeal Skinner recently neuro oncologist presents with worsening visual impairment and confusion, CT scan of the head showed vasogenic edema with mild left shift hydrocephalus since MRI  06/16/2017: Alert and oriented x 1. No new complaints.   Assessment/Plan: Active Problems:   Diabetes mellitus type 2 with retinopathy ([place])   Essential hypertension   Glioblastoma multiforme of temporal lobe (HCC)   Brain mass   Seizures (HCC)   Acute metabolic encephalopathy   Altered mental status   DNR (do not resuscitate)   Pressure injury of skin  Acute metabolic encephalopathy, improving Most likely secondary to glioblastoma multiforme of temporal lobe/seizure disorder Continue  Decadron Continue Vimpat Oncology following  Glioblastoma multiforme, stable Oncology following We will consult palliative care team in the morning for goals of care  Seizure disorder, stable Continue Vimpat Continue Keppra  HTN, stable Blood pressures well controlled Continue home medications  DM2, stable Continue insulin sliding scale   Code Status: DNR  Family Communication: Mother at bedside  Disposition Plan: SNF/home when clinically stable   Consultants: Oncology  Procedures:  None  Antimicrobials:  None  DVT prophylaxis:   Subcu Lovenox  Objective: Vitals:   06/18/17 1424 06/18/17 2144 06/19/17 0614 06/19/17 1351  BP: 127/88 121/85 124/87 121/79  Pulse: (!) 117 (!) 108 94 (!) 105  Resp: 16 16 16 16   Temp: 98.4 F (36.9 C) 98.4 F (36.9 C) 98.1 F (36.7 C) 98.4 F (36.9 C)  TempSrc: Oral Oral Axillary Oral  SpO2: 99% 99% 97% 99%  Weight:      Height:        Intake/Output Summary (Last 24 hours) at 06/19/2017 2144 Last data filed at 06/19/2017 1734 Gross per 24 hour  Intake 840 ml  Output 900 ml  Net -60 ml   Filed Weights   06/10/17 1550 06/11/17 0115 06/13/17 0443  Weight: 64 kg (141 lb) 62.5 kg (137 lb 12.6 oz) 66.4 kg (146 lb 6.2 oz)    Exam: 06/16/17  . General: 52 y.o. year-old female WD wN NAD A&O x 3 . Cardiovascular: RRR no rubs or gallops. No JVD or thyromegaly. Marland Kitchen Respiratory: Clear to auscultation with no wheezes or rales. Good inspiratory effort. . Abdomen: Soft nontender nondistended with normal bowel sounds x4 quadrants. . Musculoskeletal: Trace edema lower extremities bilaterally.  Skin: No ulcerative lesions noted or rashes, . Psychiatry: Unable to assess due to somnolence.   Data Reviewed: CBC: Recent Labs  Lab 06/13/17 1036 06/14/17 1048 06/19/17 0911  WBC 12.7* 9.9 12.6*  HGB 12.5 12.9 14.0  HCT 36.8 38.4 41.2  MCV 87.4 87.1 87.8  PLT 253 237 709   Basic Metabolic Panel: Recent Labs  Lab  06/13/17 1036 06/15/17 1009 06/17/17 0512 06/19/17 0911  NA 138 139  --  137  K 3.4* 3.6  --  4.1  CL 103 101  --  100*  CO2 21* 27  --  27  GLUCOSE 287* 198*  --  223*  BUN 11 12  --  16  CREATININE 0.54 0.46 0.39* 0.42*  CALCIUM 9.2 9.6  --  9.2   GFR: Estimated Creatinine Clearance: 75.2 mL/min (A) (by C-G  formula based on SCr of 0.42 mg/dL (L)). Liver Function Tests: Recent Labs  Lab 06/13/17 1036  AST 61*  ALT 58*  ALKPHOS 52  BILITOT 0.4  PROT 6.0*  ALBUMIN 3.1*   No results for input(s): LIPASE, AMYLASE in the last 168 hours. No results for input(s): AMMONIA in the last 168 hours. Coagulation Profile: No results for input(s): INR, PROTIME in the last 168 hours. Cardiac Enzymes: No results for input(s): CKTOTAL, CKMB, CKMBINDEX, TROPONINI in the last 168 hours. BNP (last 3 results) No results for input(s): PROBNP in the last 8760 hours. HbA1C: No results for input(s): HGBA1C in the last 72 hours. CBG: Recent Labs  Lab 06/18/17 1629 06/18/17 2007 06/19/17 0741 06/19/17 1150 06/19/17 1714  GLUCAP 115* 161* 163* 187* 165*   Lipid Profile: No results for input(s): CHOL, HDL, LDLCALC, TRIG, CHOLHDL, LDLDIRECT in the last 72 hours. Thyroid Function Tests: No results for input(s): TSH, T4TOTAL, FREET4, T3FREE, THYROIDAB in the last 72 hours. Anemia Panel: No results for input(s): VITAMINB12, FOLATE, FERRITIN, TIBC, IRON, RETICCTPCT in the last 72 hours. Urine analysis:    Component Value Date/Time   COLORURINE YELLOW 08/20/2016 0827   APPEARANCEUR HAZY (A) 08/20/2016 0827   LABSPEC 1.010 08/20/2016 0827   LABSPEC 1.010 08/09/2016 1502   PHURINE 5.0 08/20/2016 0827   GLUCOSEU 50 (A) 08/20/2016 0827   GLUCOSEU Negative 08/09/2016 1502   HGBUR NEGATIVE 08/20/2016 0827   BILIRUBINUR NEGATIVE 08/20/2016 0827   BILIRUBINUR Negative 08/09/2016 1502   KETONESUR NEGATIVE 08/20/2016 0827   PROTEINUR NEGATIVE 08/20/2016 0827   UROBILINOGEN 0.2 08/09/2016 1502    NITRITE NEGATIVE 08/20/2016 0827   LEUKOCYTESUR MODERATE (A) 08/20/2016 0827   LEUKOCYTESUR Negative 08/09/2016 1502   Sepsis Labs: @LABRCNTIP (procalcitonin:4,lacticidven:4)  ) Recent Results (from the past 240 hour(s))  MRSA PCR Screening     Status: None   Collection Time: 06/11/17  1:17 AM  Result Value Ref Range Status   MRSA by PCR NEGATIVE NEGATIVE Final    Comment:        The GeneXpert MRSA Assay (FDA approved for NASAL specimens only), is one component of a comprehensive MRSA colonization surveillance program. It is not intended to diagnose MRSA infection nor to guide or monitor treatment for MRSA infections. Performed at [facility], [place] [address] [place], [place] [address]       Studies: No results found.  Scheduled Meds: . aspirin EC  81 mg Oral Daily  . dexamethasone  4 mg Intravenous Q6H  . enoxaparin (LOVENOX) injection  40 mg Subcutaneous Daily  . fluticasone  2 spray Each Nare Daily  . insulin aspart  0-15 Units Subcutaneous TID WC  . insulin aspart  0-5 Units Subcutaneous QHS  . insulin aspart  3 Units Subcutaneous TID WC  . insulin glargine  10 Units Subcutaneous BID  . lacosamide  100 mg Oral BID  . levETIRAcetam  1,500 mg Oral BID  . polyethylene glycol  17 g Oral Daily  . senna-docusate  1 tablet Oral BID    Continuous Infusions:    LOS: 9 days     Kayleen Memos, MD [facility] Pager [phone]  If 7PM-7AM, please contact night-coverage [email or web address] Password [email or web address] 06/19/2017, 9:44 PM     PROGRESS NOTE  Jaime [name] VQM:[id number] DOB: 1965/04/23 DOA: 06/10/2017 PCP: Jaime Roan, MD  HPI/Recap of past 24 hours: Jaime [name] is an 52 y.o. 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 Mickeal Skinner recently neuro oncologist presents with worsening visual impairment and confusion, CT scan of the head showed  vasogenic edema with mild left shift hydrocephalus since MRI.  06/17/17: No new complaints. Awaiting placement to SNF.  Assessment/Plan: Active Problems:   Diabetes mellitus type 2 with retinopathy ([place])   Essential hypertension   Glioblastoma multiforme of temporal lobe (HCC)   Brain mass   Seizures (HCC)   Acute metabolic encephalopathy   Altered mental status   DNR (do not resuscitate)   Pressure injury of skin  Acute metabolic encephalopathy most likely 2/2 to cerebral edema from worsening glioblastoma, improving Mother states not at baseline but more alert C/w decadron while in the hospital Continue Vimpat and keppra Oncology following  Glioblastoma multiforme Personally reviewed CT head done on 06/10/17 with mother in the room which revealed cerebral edema at left temporal lobe at skite of tumor and left to right shift Palliative consult for goals of care and symptoms management Oncology following  Possible CAP, POA completed IV ceftriaxone and IV azithromycin Afebrile procalcitonin less than 0.10  Seizures, stable Continue Vimpat Continue Keppra  HTN, stable Blood pressures well controlled Continue home medications  DM2, stable Continue insulin sliding scale  Physical debility, stable PT recommends HHPT   Code Status: DNR  Family Communication: Mother at bedside  Disposition Plan: SNF/home when clinically stable   Consultants: Oncology  Procedures:  None  Antimicrobials:  none  DVT prophylaxis:   Subcu Lovenox  Objective: Vitals:   06/18/17 1424 06/18/17 2144 06/19/17 0614 06/19/17 1351  BP: 127/88 121/85 124/87 121/79  Pulse: (!) 117 (!) 108 94 (!) 105  Resp: 16 16 16 16   Temp: 98.4 F (36.9 C) 98.4 F (36.9 C) 98.1 F (36.7 C) 98.4 F (36.9 C)  TempSrc: Oral Oral Axillary Oral  SpO2: 99% 99% 97% 99%  Weight:      Height:        Intake/Output Summary (Last 24 hours) at 06/19/2017 2144 Last data filed at 06/19/2017 1734 Gross  per 24 hour  Intake 840 ml  Output 900 ml  Net -60 ml   Filed Weights   06/10/17 1550 06/11/17 0115 06/13/17 0443  Weight: 64 kg (141 lb) 62.5 kg (137 lb 12.6 oz) 66.4 kg (146 lb 6.2 oz)    Exam: 06/17/17  . General: 52 y.o. year-old female WD WN NAD A&O x 1 . Cardiovascular:RRR no rubs or gallops. No JVD or thyromegaly. Marland Kitchen Respiratory: Clear to auscultation with no wheezes or rales. Good inspiratory effort. . Abdomen: Soft nontender nondistended with normal bowel sounds x4 quadrants. . Musculoskeletal: Trace edema lower extremities bilaterally.  Skin: No ulcerative lesions noted or rashes, . Psychiatry: Mood is appropriate for condition and setting.   Data Reviewed: CBC: Recent Labs  Lab 06/13/17 1036 06/14/17 1048 06/19/17 0911  WBC 12.7* 9.9 12.6*  HGB 12.5 12.9 14.0  HCT 36.8 38.4 41.2  MCV 87.4 87.1 87.8  PLT 253 237 846   Basic Metabolic Panel: Recent Labs  Lab 06/13/17 1036 06/15/17 1009 06/17/17 0512 06/19/17 0911  NA 138 139  --  137  K 3.4* 3.6  --  4.1  CL 103 101  --  100*  CO2 21* 27  --  27  GLUCOSE 287* 198*  --  223*  BUN 11 12  --  16  CREATININE 0.54 0.46 0.39* 0.42*  CALCIUM 9.2 9.6  --  9.2   GFR: Estimated Creatinine Clearance:  75.2 mL/min (A) (by C-G formula based on SCr of 0.42 mg/dL (L)). Liver Function Tests: Recent Labs  Lab 06/13/17 1036  AST 61*  ALT 58*  ALKPHOS 52  BILITOT 0.4  PROT 6.0*  ALBUMIN 3.1*   No results for input(s): LIPASE, AMYLASE in the last 168 hours. No results for input(s): AMMONIA in the last 168 hours. Coagulation Profile: No results for input(s): INR, PROTIME in the last 168 hours. Cardiac Enzymes: No results for input(s): CKTOTAL, CKMB, CKMBINDEX, TROPONINI in the last 168 hours. BNP (last 3 results) No results for input(s): PROBNP in the last 8760 hours. HbA1C: No results for input(s): HGBA1C in the last 72 hours. CBG: Recent Labs  Lab 06/18/17 1629 06/18/17 2007 06/19/17 0741 06/19/17 1150  06/19/17 1714  GLUCAP 115* 161* 163* 187* 165*   Lipid Profile: No results for input(s): CHOL, HDL, LDLCALC, TRIG, CHOLHDL, LDLDIRECT in the last 72 hours. Thyroid Function Tests: No results for input(s): TSH, T4TOTAL, FREET4, T3FREE, THYROIDAB in the last 72 hours. Anemia Panel: No results for input(s): VITAMINB12, FOLATE, FERRITIN, TIBC, IRON, RETICCTPCT in the last 72 hours. Urine analysis:    Component Value Date/Time   COLORURINE YELLOW 08/20/2016 0827   APPEARANCEUR HAZY (A) 08/20/2016 0827   LABSPEC 1.010 08/20/2016 0827   LABSPEC 1.010 08/09/2016 1502   PHURINE 5.0 08/20/2016 0827   GLUCOSEU 50 (A) 08/20/2016 0827   GLUCOSEU Negative 08/09/2016 1502   HGBUR NEGATIVE 08/20/2016 0827   BILIRUBINUR NEGATIVE 08/20/2016 0827   BILIRUBINUR Negative 08/09/2016 1502   KETONESUR NEGATIVE 08/20/2016 0827   PROTEINUR NEGATIVE 08/20/2016 0827   UROBILINOGEN 0.2 08/09/2016 1502   NITRITE NEGATIVE 08/20/2016 0827   LEUKOCYTESUR MODERATE (A) 08/20/2016 0827   LEUKOCYTESUR Negative 08/09/2016 1502   Sepsis Labs: @LABRCNTIP (procalcitonin:4,lacticidven:4)  ) Recent Results (from the past 240 hour(s))  MRSA PCR Screening     Status: None   Collection Time: 06/11/17  1:17 AM  Result Value Ref Range Status   MRSA by PCR NEGATIVE NEGATIVE Final    Comment:        The GeneXpert MRSA Assay (FDA approved for NASAL specimens only), is one component of a comprehensive MRSA colonization surveillance program. It is not intended to diagnose MRSA infection nor to guide or monitor treatment for MRSA infections. Performed at [facility], [place] [address], [place], [place] [address]       Studies: No results found.  Scheduled Meds: . aspirin EC  81 mg Oral Daily  . dexamethasone  4 mg Intravenous Q6H  . enoxaparin (LOVENOX) injection  40 mg Subcutaneous Daily  . fluticasone  2 spray Each Nare Daily  . insulin aspart  0-15 Units Subcutaneous TID WC  . insulin  aspart  0-5 Units Subcutaneous QHS  . insulin aspart  3 Units Subcutaneous TID WC  . insulin glargine  10 Units Subcutaneous BID  . lacosamide  100 mg Oral BID  . levETIRAcetam  1,500 mg Oral BID  . polyethylene glycol  17 g Oral Daily  . senna-docusate  1 tablet Oral BID    Continuous Infusions:    LOS: 9 days     Kayleen Memos, MD [facility] Pager [phone]  If 7PM-7AM, please contact night-coverage [email or web address] Password [email or web address] 06/19/2017, 9:44 PM     PROGRESS NOTE  Jaime [name] ZHY:[id number] DOB: 03-03-1965 DOA: 06/10/2017 PCP: Jaime Roan, MD  HPI/Recap of past 24 hours: Jaime [name] is an 52 y.o. female past medical history of high-grade  glioblastoma multiforme on oral chemotherapy status post radiation therapy he was seen by Dr. Mickeal Skinner recently neuro oncologist presents with worsening visual impairment and confusion, CT scan of the head showed vasogenic edema with mild left shift hydrocephalus since MRI  06/16/2017: Alert and oriented x 1. No new complaints.   Assessment/Plan: Active Problems:   Diabetes mellitus type 2 with retinopathy ([place])   Essential hypertension   Glioblastoma multiforme of temporal lobe (HCC)   Brain mass   Seizures (HCC)   Acute metabolic encephalopathy   Altered mental status   DNR (do not resuscitate)   Pressure injury of skin  Acute metabolic encephalopathy, improving Most likely secondary to glioblastoma multiforme of temporal lobe/seizure disorder Continue Decadron Continue Vimpat Oncology following  Glioblastoma multiforme, stable Oncology following We will consult palliative care team in the morning for goals of care  Seizure disorder, stable Continue Vimpat Continue Keppra  HTN, stable Blood pressures well controlled Continue home medications  DM2, stable Continue insulin sliding scale   Code Status: DNR  Family Communication: Mother at bedside  Disposition Plan: SNF/home when  clinically stable   Consultants: Oncology  Procedures:  None  Antimicrobials:  None  DVT prophylaxis:   Subcu Lovenox  Objective: Vitals:   06/18/17 1424 06/18/17 2144 06/19/17 0614 06/19/17 1351  BP: 127/88 121/85 124/87 121/79  Pulse: (!) 117 (!) 108 94 (!) 105  Resp: 16 16 16 16   Temp: 98.4 F (36.9 C) 98.4 F (36.9 C) 98.1 F (36.7 C) 98.4 F (36.9 C)  TempSrc: Oral Oral Axillary Oral  SpO2: 99% 99% 97% 99%  Weight:      Height:        Intake/Output Summary (Last 24 hours) at 06/19/2017 2144 Last data filed at 06/19/2017 1734 Gross per 24 hour  Intake 840 ml  Output 900 ml  Net -60 ml   Filed Weights   06/10/17 1550 06/11/17 0115 06/13/17 0443  Weight: 64 kg (141 lb) 62.5 kg (137 lb 12.6 oz) 66.4 kg (146 lb 6.2 oz)    Exam: 06/18/17. No change in physical exam.  . General: 52 y.o. year-old female WD wN NAD A&O x 3 . Cardiovascular: RRR no rubs or gallops. No JVD or thyromegaly. Marland Kitchen Respiratory: Clear to auscultation with no wheezes or rales. Good inspiratory effort. . Abdomen: Soft nontender nondistended with normal bowel sounds x4 quadrants. . Musculoskeletal: Trace edema lower extremities bilaterally.  Skin: No ulcerative lesions noted or rashes, . Psychiatry: Unable to assess due to somnolence.   Data Reviewed: CBC: Recent Labs  Lab 06/13/17 1036 06/14/17 1048 06/19/17 0911  WBC 12.7* 9.9 12.6*  HGB 12.5 12.9 14.0  HCT 36.8 38.4 41.2  MCV 87.4 87.1 87.8  PLT 253 237 811   Basic Metabolic Panel: Recent Labs  Lab 06/13/17 1036 06/15/17 1009 06/17/17 0512 06/19/17 0911  NA 138 139  --  137  K 3.4* 3.6  --  4.1  CL 103 101  --  100*  CO2 21* 27  --  27  GLUCOSE 287* 198*  --  223*  BUN 11 12  --  16  CREATININE 0.54 0.46 0.39* 0.42*  CALCIUM 9.2 9.6  --  9.2   GFR: Estimated Creatinine Clearance: 75.2 mL/min (A) (by C-G formula based on SCr of 0.42 mg/dL (L)). Liver Function Tests: Recent Labs  Lab 06/13/17 1036  AST 61*  ALT  58*  ALKPHOS 52  BILITOT 0.4  PROT 6.0*  ALBUMIN 3.1*   No results for  input(s): LIPASE, AMYLASE in the last 168 hours. No results for input(s): AMMONIA in the last 168 hours. Coagulation Profile: No results for input(s): INR, PROTIME in the last 168 hours. Cardiac Enzymes: No results for input(s): CKTOTAL, CKMB, CKMBINDEX, TROPONINI in the last 168 hours. BNP (last 3 results) No results for input(s): PROBNP in the last 8760 hours. HbA1C: No results for input(s): HGBA1C in the last 72 hours. CBG: Recent Labs  Lab 06/18/17 1629 06/18/17 2007 06/19/17 0741 06/19/17 1150 06/19/17 1714  GLUCAP 115* 161* 163* 187* 165*   Lipid Profile: No results for input(s): CHOL, HDL, LDLCALC, TRIG, CHOLHDL, LDLDIRECT in the last 72 hours. Thyroid Function Tests: No results for input(s): TSH, T4TOTAL, FREET4, T3FREE, THYROIDAB in the last 72 hours. Anemia Panel: No results for input(s): VITAMINB12, FOLATE, FERRITIN, TIBC, IRON, RETICCTPCT in the last 72 hours. Urine analysis:    Component Value Date/Time   COLORURINE YELLOW 08/20/2016 0827   APPEARANCEUR HAZY (A) 08/20/2016 0827   LABSPEC 1.010 08/20/2016 0827   LABSPEC 1.010 08/09/2016 1502   PHURINE 5.0 08/20/2016 0827   GLUCOSEU 50 (A) 08/20/2016 0827   GLUCOSEU Negative 08/09/2016 1502   HGBUR NEGATIVE 08/20/2016 0827   BILIRUBINUR NEGATIVE 08/20/2016 0827   BILIRUBINUR Negative 08/09/2016 1502   KETONESUR NEGATIVE 08/20/2016 0827   PROTEINUR NEGATIVE 08/20/2016 0827   UROBILINOGEN 0.2 08/09/2016 1502   NITRITE NEGATIVE 08/20/2016 0827   LEUKOCYTESUR MODERATE (A) 08/20/2016 0827   LEUKOCYTESUR Negative 08/09/2016 1502   Sepsis Labs: @LABRCNTIP (procalcitonin:4,lacticidven:4)  ) Recent Results (from the past 240 hour(s))  MRSA PCR Screening     Status: None   Collection Time: 06/11/17  1:17 AM  Result Value Ref Range Status   MRSA by PCR NEGATIVE NEGATIVE Final    Comment:        The GeneXpert MRSA Assay (FDA approved  for NASAL specimens only), is one component of a comprehensive MRSA colonization surveillance program. It is not intended to diagnose MRSA infection nor to guide or monitor treatment for MRSA infections. Performed at [facility], [place] [address] [place], [place] [address]       Studies: No results found.  Scheduled Meds: . aspirin EC  81 mg Oral Daily  . dexamethasone  4 mg Intravenous Q6H  . enoxaparin (LOVENOX) injection  40 mg Subcutaneous Daily  . fluticasone  2 spray Each Nare Daily  . insulin aspart  0-15 Units Subcutaneous TID WC  . insulin aspart  0-5 Units Subcutaneous QHS  . insulin aspart  3 Units Subcutaneous TID WC  . insulin glargine  10 Units Subcutaneous BID  . lacosamide  100 mg Oral BID  . levETIRAcetam  1,500 mg Oral BID  . polyethylene glycol  17 g Oral Daily  . senna-docusate  1 tablet Oral BID    Continuous Infusions:    LOS: 9 days     Kayleen Memos, MD [facility] Pager [phone]  If 7PM-7AM, please contact night-coverage [email or web address] Password [facility] 06/19/2017, 9:44 PM

## 2017-06-19 NOTE — Progress Notes (Addendum)
PT Cancellation Note  Patient Details Name: Jaime [name] MRN: [id number] DOB: 07/06/1965   Cancelled Treatment:    Reason Eval/Treat Not Completed: Fatigue/lethargy limiting ability to participate(pt sleeping soundly, did not arouse to verbal nor tactile stimuli. Will follow. ) Please see most recent PT note from 06/15/17 for recommendations.    Blondell Reveal Kistler 06/19/2017, 1:37 PM [phone]

## 2017-06-19 NOTE — Progress Notes (Signed)
CSW left voicemail with [facility] MUST/PASRR representative to coordinate rep visiting pt. Also updated [facility] on status.  Sharren Bridge, MSW, LCSW Clinical Social Work 06/19/2017 [phone]

## 2017-06-20 ENCOUNTER — Ambulatory Visit: Payer: Medicaid Other

## 2017-06-20 LAB — GLUCOSE, CAPILLARY
GLUCOSE-CAPILLARY: 191 mg/dL — AB (ref 65–99)
GLUCOSE-CAPILLARY: 137 mg/dL — AB (ref 65–99)
GLUCOSE-CAPILLARY: 259 mg/dL — AB (ref 65–99)
Glucose-Capillary: 100 mg/dL — ABNORMAL HIGH (ref 65–99)
Glucose-Capillary: 281 mg/dL — ABNORMAL HIGH (ref 65–99)

## 2017-06-20 NOTE — Progress Notes (Signed)
PT Cancellation Note  Patient Details Name: ASPEN LAWRANCE MRN: [id number] DOB: [birth year]   Cancelled Treatment:    Reason Eval/Treat Not Completed: Fatigue/lethargy limiting ability to participate. Pt declined participation on today. Will check back another day.    Weston Anna, MPT Pager: [phone]

## 2017-06-20 NOTE — Progress Notes (Signed)
PROGRESS NOTE    Jaime [name]  NIO:[id number] DOB: 07/28/1965 DOA: 06/10/2017 PCP: Katherine Roan, MD    Brief Narrative:  52 year old female who presented with increasing confusion.  She does have the significant past medical history for glioblastoma multiforme.  Progressive disease, with confusion and seizures.  She had decided to pursue palliative chemotherapy.  On the initial physical examination blood pressure 120/89, heart rate 100, respirate 19, temperature 98.2, saturation 97%.  Patient was somnolent, but oriented, lungs are clear to auscultation bilaterally, heart S1-S2 present rhythmic, abdomen soft nontender, no lower extremity edema.   Patient was admitted to the hospital with the working diagnosis of acute encephalopathy due to intracranial hypertension due to cerebral edema related to progressive glioblastoma multiforme.   Assessment & Plan:   Active Problems:   Diabetes mellitus type 2 with retinopathy ([place])   Essential hypertension   Glioblastoma multiforme of temporal lobe (HCC)   Brain mass   Seizures ([place])   Acute metabolic encephalopathy   Altered mental status   DNR (do not resuscitate)   Pressure injury of skin   1.  Encephalopathy due to brain edema related to glioblastoma multiforme. No significant headache, no nausea or vomiting, continue neuro checks per unit protocol. Pending placement at snf. Continue decadron for edema. Continue seizure prophylaxis with lacosamide and keppra. As needed haldol for agitation. Discontinue asa.   2.  Lingular pneumonia. Chest ct personally reviewed, very small infiltrate, patient has been afebrile, cultures with no growth, completed antibiotic therapy.  3.  Hypertension. Continue blood pressure control as needed hydralazine  4.  Type 2 diabetes mellitus. Continue glucose cover and monitoring with insulin sliding scale, patient tolerating po well. Basal insulin with glargine 10 units daily. Pre meal insulin 3 units  aspart. Capillary glucose 137, 259.    DVT prophylaxis: enoxaparin   Code Status:  dnr Family Communication: I spoke with patient's mother at the bedside and all questions were addressed.  Disposition Plan: snf when bed available    Consultants:     Procedures:     Antimicrobials:   Ceftriaxone     Subjective: Patient somnolent, most information from her mother at the bedside, no nausea or vomiting, no chest pain or dyspnea. Has been working with physical therapy.   Objective: Vitals:   06/19/17 1351 06/19/17 2200 06/20/17 0514 06/20/17 1415  BP: 121/79 114/80 119/90 (!) 127/98  Pulse: (!) 105 (!) 109 (!) 101 (!) 133  Resp: 16 18 20 16   Temp: 98.4 F (36.9 C) 98.2 F (36.8 C) 98.2 F (36.8 C) 98.4 F (36.9 C)  TempSrc: Oral Oral Axillary Oral  SpO2: 99% 98% 95% 98%  Weight:      Height:        Intake/Output Summary (Last 24 hours) at 06/20/2017 1439 Last data filed at 06/19/2017 1734 Gross per 24 hour  Intake -  Output 600 ml  Net -600 ml   Filed Weights   06/10/17 1550 06/11/17 0115 06/13/17 0443  Weight: 64 kg (141 lb) 62.5 kg (137 lb 12.6 oz) 66.4 kg (146 lb 6.2 oz)    Examination:   General: deconditioned and ill looking appearing  Neurology: Awake and alert, non focal  E ENT: mild pallor, no icterus, oral mucosa moist Cardiovascular: No JVD. S1-S2 present, rhythmic, no gallops, rubs, or murmurs. No lower extremity edema. Pulmonary: vesicular breath sounds bilaterally, adequate air movement, no wheezing, rhonchi or rales. Gastrointestinal. Abdomen with no organomegaly, non tender, no rebound or guarding Skin.  No rashes Musculoskeletal: no joint deformities     Data Reviewed: I have personally reviewed following labs and imaging studies  CBC: Recent Labs  Lab 06/14/17 1048 06/19/17 0911  WBC 9.9 12.6*  HGB 12.9 14.0  HCT 38.4 41.2  MCV 87.1 87.8  PLT 237 834   Basic Metabolic Panel: Recent Labs  Lab 06/15/17 1009 06/17/17 0512  06/19/17 0911  NA 139  --  137  K 3.6  --  4.1  CL 101  --  100*  CO2 27  --  27  GLUCOSE 198*  --  223*  BUN 12  --  16  CREATININE 0.46 0.39* 0.42*  CALCIUM 9.6  --  9.2   GFR: Estimated Creatinine Clearance: 75.2 mL/min (A) (by C-G formula based on SCr of 0.42 mg/dL (L)). Liver Function Tests: No results for input(s): AST, ALT, ALKPHOS, BILITOT, PROT, ALBUMIN in the last 168 hours. No results for input(s): LIPASE, AMYLASE in the last 168 hours. No results for input(s): AMMONIA in the last 168 hours. Coagulation Profile: No results for input(s): INR, PROTIME in the last 168 hours. Cardiac Enzymes: No results for input(s): CKTOTAL, CKMB, CKMBINDEX, TROPONINI in the last 168 hours. BNP (last 3 results) No results for input(s): PROBNP in the last 8760 hours. HbA1C: No results for input(s): HGBA1C in the last 72 hours. CBG: Recent Labs  Lab 06/19/17 1150 06/19/17 1714 06/19/17 2216 06/20/17 0751 06/20/17 1124  GLUCAP 187* 165* 191* 137* 259*   Lipid Profile: No results for input(s): CHOL, HDL, LDLCALC, TRIG, CHOLHDL, LDLDIRECT in the last 72 hours. Thyroid Function Tests: No results for input(s): TSH, T4TOTAL, FREET4, T3FREE, THYROIDAB in the last 72 hours. Anemia Panel: No results for input(s): VITAMINB12, FOLATE, FERRITIN, TIBC, IRON, RETICCTPCT in the last 72 hours.    Radiology Studies: I have reviewed all of the imaging during this hospital visit personally     Scheduled Meds: . aspirin EC  81 mg Oral Daily  . dexamethasone  4 mg Intravenous Q6H  . enoxaparin (LOVENOX) injection  40 mg Subcutaneous Daily  . fluticasone  2 spray Each Nare Daily  . insulin aspart  0-15 Units Subcutaneous TID WC  . insulin aspart  0-5 Units Subcutaneous QHS  . insulin aspart  3 Units Subcutaneous TID WC  . insulin glargine  10 Units Subcutaneous BID  . lacosamide  100 mg Oral BID  . levETIRAcetam  1,500 mg Oral BID  . polyethylene glycol  17 g Oral Daily  . senna-docusate   1 tablet Oral BID   Continuous Infusions:   LOS: 10 days        Izekiel Flegel Gerome Apley, MD [facility] Pager [phone]

## 2017-06-21 ENCOUNTER — Inpatient Hospital Stay: Payer: Medicaid Other

## 2017-06-21 ENCOUNTER — Inpatient Hospital Stay: Payer: Medicaid Other | Admitting: Internal Medicine

## 2017-06-21 ENCOUNTER — Inpatient Hospital Stay (HOSPITAL_COMMUNITY): Payer: Medicaid Other

## 2017-06-21 ENCOUNTER — Inpatient Hospital Stay: Payer: Medicaid Other | Admitting: Oncology

## 2017-06-21 LAB — GLUCOSE, CAPILLARY
Glucose-Capillary: 154 mg/dL — ABNORMAL HIGH (ref 65–99)
Glucose-Capillary: 144 mg/dL — ABNORMAL HIGH (ref 65–99)
Glucose-Capillary: 188 mg/dL — ABNORMAL HIGH (ref 65–99)
Glucose-Capillary: 181 mg/dL — ABNORMAL HIGH (ref 65–99)

## 2017-06-21 MED ORDER — GERHARDT'S BUTT CREAM
TOPICAL_CREAM | Freq: Four times a day (QID) | CUTANEOUS | Status: DC
  Administered 2017-06-21 – 2017-06-23 (×7): via TOPICAL
  Filled 2017-06-21: qty 1

## 2017-06-21 MED ORDER — SODIUM CHLORIDE 0.9 % IV SOLN
INTRAVENOUS | Status: DC
  Administered 2017-06-21 – 2017-06-23 (×4): via INTRAVENOUS

## 2017-06-21 NOTE — Progress Notes (Addendum)
Inpatient Diabetes Program Recommendations  AACE/ADA: New Consensus Statement on Inpatient Glycemic Control (2015)  Target Ranges:  Prepandial:   less than 140 mg/dL      Peak postprandial:   less than 180 mg/dL (1-2 hours)      Critically ill patients:  140 - 180 mg/dL   Results for Jaime [name], Jaime [name] (MRN [id number]) as of 06/21/2017 10:16  Ref. Range 06/20/2017 07:51 06/20/2017 11:24 06/20/2017 16:45 06/20/2017 22:09  Glucose-Capillary Latest Ref Range: 65 - 99 mg/dL 137 (H)  2 units NOVOLOG  259 (H)  11 units NOVOLOG +  10 units LANTUS  100 (H)  0 units NOVOLOG- Patient refused Novolog Meal Coverage at Dinner  281 (H)     10 units LANTUS   Home DM Meds: Lantus 17 units QHS       Novolog 3-5 units TID with meals       Metformin 1000 mg BID   Current Orders: Lantus 10 units BID       Novolog Moderate Correction Scale/ SSI (0-15 units) TID AC + HS      Novolog 3 units TID with meals        Note patient getting Decadron 4 mg Q6H.  Patient did not receive Novolog Meal Coverage at Breakfast yesterday AM- Only got 2 units Novolog correction scale.  CBG by 12pm rose to 259 mg/dl.  Patient/Family refused Novolog Meal Coverage at [place] last PM and CBG rose significantly to 281 mg/dl by bedtime.  It appears patient definitely needs the Novolog Meal Coverage (3 units TID with meals) to be given consistently when she is eating.     --Will follow patient during hospitalization--  Wyn Quaker RN, MSN, CDE Diabetes Coordinator Inpatient Glycemic Control Team Team Pager: [phone] (8a-5p)

## 2017-06-21 NOTE — Progress Notes (Signed)
Physical Therapy Treatment Patient Details Name: Jaime [name] MRN: [id number] DOB: 1965/07/31 Today's Date: 06/21/2017    History of Present Illness 52 y.o. female past medical history of high-grade glioblastoma multiforme , presents with worsening visual impairment and confusion, CT scan of the head showed vasogenic edema with mild left shift hydrocephalus since MRI    PT Comments    Difficult to arouse and lethargic throughout session.  Vitals prior to activity BP 127/95, HR 131, RA 98%.   Assisted to sitting EOB Total Assist to attempt increased alertness.  Vitals EOB BP 129/93, HR 122, RA 97% General bed mobility comments: pt difficult to arouse and 75% lethargic requiring Total Assist to sit to EOB to attempt increased alertness.  Once upright, pt required  MAX Assist to prevent LOB all planes.  Pt unable to support self sitting with poor forward flex head posture.   Pt sat EOB x 7 min with trace increase in alertness.  Unable to attempt OOB transfer due to low perfomance ability, assisted pt back to bed LEFT sidelying.  Follow Up Recommendations  SNF     Equipment Recommendations  None recommended by PT    Recommendations for Other Services       Precautions / Restrictions Precautions Precautions: Fall Precaution Comments: unsteady with walking, 8 falls in past 1 year per pt's mother Restrictions Weight Bearing Restrictions: No    Mobility  Bed Mobility Overal bed mobility: Needs Assistance Bed Mobility: Supine to Sit;Sit to Supine     Supine to sit: Total assist;+2 for physical assistance;+2 for safety/equipment;HOB elevated(pt 5%) Sit to supine: Total assist;+2 for physical assistance;+2 for safety/equipment(pt 0%)   General bed mobility comments: pt difficult to arouse and 75% lethargic requiring Total Assist to sit to EOB to attempt increased alertness.  Once upright, pt required  MAX Assist to prevent LOB all planes.  Pt unable to support self sitting with poor  forward flex head posture.   Pt sat EOB x 7 min with trace increase in alertness.  Unable to attempt OOB transfer due to low perfomance ability, assisted pt back to bed LEFT sidelying.  Transfers                 General transfer comment: unable to attempt due to lethargy  Ambulation/Gait                 Stairs             Wheelchair Mobility    Modified Rankin (Stroke Patients Only)       Balance                                            Cognition Arousal/Alertness: Lethargic                                     General Comments: poor eye contact and a few mumbled words      Exercises      General Comments        Pertinent Vitals/Pain Pain Assessment: No/denies pain    Home Living                      Prior Function            PT Goals (current  goals can now be found in the care plan section) Progress towards PT goals: Progressing toward goals    Frequency    Min 2X/week      PT Plan Current plan remains appropriate    Co-evaluation              AM-PAC PT "6 Clicks" Daily Activity  Outcome Measure  Difficulty turning over in bed (including adjusting bedclothes, sheets and blankets)?: Unable Difficulty moving from lying on back to sitting on the side of the bed? : Unable Difficulty sitting down on and standing up from a chair with arms (e.g., wheelchair, bedside commode, etc,.)?: Unable Help needed moving to and from a bed to chair (including a wheelchair)?: Total Help needed walking in hospital room?: Total Help needed climbing 3-5 steps with a railing? : Total 6 Click Score: 6    End of Session Equipment Utilized During Treatment: Gait belt Activity Tolerance: Patient limited by lethargy Patient left: in bed;with family/visitor present;with bed alarm set Nurse Communication: Mobility status;Need for lift equipment PT Visit Diagnosis: Unsteadiness on feet  (R26.81);Hemiplegia and hemiparesis;Muscle weakness (generalized) (M62.81);Difficulty in walking, not elsewhere classified (R26.2);History of falling (Z91.81);Repeated falls (R29.6)     Time: 3754-3606 PT Time Calculation (min) (ACUTE ONLY): 28 min  Charges:  $Therapeutic Activity: 23-37 mins                    G Codes:       Rica Koyanagi  PTA [facility]  Acute  Rehab Pager      [phone]

## 2017-06-21 NOTE — Progress Notes (Signed)
Pt received approved PASRR #[id number] B- no end date. Informed [facility]- facility prepared for pt to admit at DC. Facility having pt's mother do admission paperwork today.  Sharren Bridge, MSW, LCSW Clinical Social Work 06/21/2017 [phone]

## 2017-06-21 NOTE — Progress Notes (Signed)
PROGRESS NOTE    Jaime [name]  ZOX:[id number] DOB: October 04, 1965 DOA: 06/10/2017 PCP: Katherine Roan, MD    Brief Narrative:  52 year old female who presented with increasing confusion.  She does have the significant past medical history for glioblastoma multiforme.  Progressive disease, with confusion and seizures.  She had decided to pursue palliative chemotherapy.  On the initial physical examination blood pressure 120/89, heart rate 100, respirate 19, temperature 98.2, saturation 97%.  Patient was somnolent, but oriented, lungs are clear to auscultation bilaterally, heart S1-S2 present rhythmic, abdomen soft nontender, no lower extremity edema.   Patient was admitted to the hospital with the working diagnosis of acute encephalopathy due to intracranial hypertension due to cerebral edema related to progressive glioblastoma multiforme.   Assessment & Plan:   Active Problems:   Diabetes mellitus type 2 with retinopathy ([place])   Essential hypertension   Glioblastoma multiforme of temporal lobe (HCC)   Brain mass   Seizures ([place])   Acute metabolic encephalopathy   Altered mental status   DNR (do not resuscitate)   Pressure injury of skin   1.  Encephalopathy due to brain edema related to glioblastoma multiforme.  - transitioned to SNF on current regimen.   2.  Lingular pneumonia.  - Patient treated with 4 days of antibiotics. No worsening sob, sputum production. But with tachycardia and elevated wbc (the wbc may be 2ary to decadron) I will continue to monitor.  - will obtain chest x ray.   3.  Hypertension. Continue blood pressure control as needed hydralazine  4.  Type 2 diabetes mellitus.  - continue SSI  5. Tachycardia - Etiology uncertain. Will place on fluids (normal saline) - obtain 12 lead EKG   DVT prophylaxis: enoxaparin   Code Status:  dnr Family Communication: I spoke with patient's mother at the bedside and all questions were addressed.  Disposition Plan:  snf when bed available    Consultants:     Procedures:     Antimicrobials:   none   Subjective: Patient somnolent but answers questions  Objective: Vitals:   06/21/17 0500 06/21/17 0546 06/21/17 1210 06/21/17 1340  BP:  111/81 (!) 129/93 113/81  Pulse:  (!) 112 (!) 131 (!) 124  Resp:  16  18  Temp:  98.5 F (36.9 C)  99.4 F (37.4 C)  TempSrc:  Oral  Oral  SpO2:  98% 97% 98%  Weight: 62.3 kg (137 lb 5.6 oz)     Height:        Intake/Output Summary (Last 24 hours) at 06/21/2017 1626 Last data filed at 06/21/2017 1421 Gross per 24 hour  Intake 480 ml  Output 1000 ml  Net -520 ml   Filed Weights   06/11/17 0115 06/13/17 0443 06/21/17 0500  Weight: 62.5 kg (137 lb 12.6 oz) 66.4 kg (146 lb 6.2 oz) 62.3 kg (137 lb 5.6 oz)    Examination:   General: deconditioned and ill looking appearing, in nad. Neurology: Awake and alert, non focal  E ENT: mild pallor, no icterus, oral mucosa moist Cardiovascular: No JVD. S1-S2 present, rhythmic, no gallops, rubs, or murmurs. No lower extremity edema. Pulmonary:  adequate air movement, no wheezing, rhonchi or rales. Gastrointestinal. Abdomen with no organomegaly, non tender, no rebound or guarding Skin. No rashes Musculoskeletal: no joint deformities     Data Reviewed: I have personally reviewed following labs and imaging studies  CBC: Recent Labs  Lab 06/19/17 0911  WBC 12.6*  HGB 14.0  HCT 41.2  MCV 87.8  PLT 462   Basic Metabolic Panel: Recent Labs  Lab 06/15/17 1009 06/17/17 0512 06/19/17 0911  NA 139  --  137  K 3.6  --  4.1  CL 101  --  100*  CO2 27  --  27  GLUCOSE 198*  --  223*  BUN 12  --  16  CREATININE 0.46 0.39* 0.42*  CALCIUM 9.6  --  9.2   GFR: Estimated Creatinine Clearance: 73 mL/min (A) (by C-G formula based on SCr of 0.42 mg/dL (L)). Liver Function Tests: No results for input(s): AST, ALT, ALKPHOS, BILITOT, PROT, ALBUMIN in the last 168 hours. No results for input(s): LIPASE,  AMYLASE in the last 168 hours. No results for input(s): AMMONIA in the last 168 hours. Coagulation Profile: No results for input(s): INR, PROTIME in the last 168 hours. Cardiac Enzymes: No results for input(s): CKTOTAL, CKMB, CKMBINDEX, TROPONINI in the last 168 hours. BNP (last 3 results) No results for input(s): PROBNP in the last 8760 hours. HbA1C: No results for input(s): HGBA1C in the last 72 hours. CBG: Recent Labs  Lab 06/20/17 1124 06/20/17 1645 06/20/17 2209 06/21/17 0722 06/21/17 1227  GLUCAP 259* 100* 281* 154* 144*   Lipid Profile: No results for input(s): CHOL, HDL, LDLCALC, TRIG, CHOLHDL, LDLDIRECT in the last 72 hours. Thyroid Function Tests: No results for input(s): TSH, T4TOTAL, FREET4, T3FREE, THYROIDAB in the last 72 hours. Anemia Panel: No results for input(s): VITAMINB12, FOLATE, FERRITIN, TIBC, IRON, RETICCTPCT in the last 72 hours.    Radiology Studies: I have reviewed all of the imaging during this hospital visit personally     Scheduled Meds: . dexamethasone  4 mg Intravenous Q6H  . enoxaparin (LOVENOX) injection  40 mg Subcutaneous Daily  . fluticasone  2 spray Each Nare Daily  . Gerhardt's butt cream   Topical QID  . insulin aspart  0-15 Units Subcutaneous TID WC  . insulin aspart  0-5 Units Subcutaneous QHS  . insulin aspart  3 Units Subcutaneous TID WC  . insulin glargine  10 Units Subcutaneous BID  . lacosamide  100 mg Oral BID  . levETIRAcetam  1,500 mg Oral BID  . polyethylene glycol  17 g Oral Daily  . senna-docusate  1 tablet Oral BID   Continuous Infusions:   LOS: 11 days     Velvet Bathe, MD [facility] Pager [phone]

## 2017-06-22 LAB — GLUCOSE, CAPILLARY
GLUCOSE-CAPILLARY: 151 mg/dL — AB (ref 65–99)
GLUCOSE-CAPILLARY: 163 mg/dL — AB (ref 65–99)
Glucose-Capillary: 100 mg/dL — ABNORMAL HIGH (ref 65–99)
Glucose-Capillary: 135 mg/dL — ABNORMAL HIGH (ref 65–99)

## 2017-06-22 MED ORDER — DEXAMETHASONE 4 MG PO TABS: 4.0000 mg | ORAL_TABLET | Freq: Four times a day (QID) | ORAL | 0 refills | Status: DC | PRN

## 2017-06-22 MED ORDER — LACOSAMIDE 100 MG PO TABS: 100.0000 mg | ORAL_TABLET | Freq: Two times a day (BID) | ORAL | Status: DC

## 2017-06-22 NOTE — Progress Notes (Addendum)
CSW received a call from pt's RN at ph: [phone] stating pt was D/C'd after 5pm and is to be D/C'd to [facility].  CSW reviewed CSW notes and pt has received PASSR and FL-2 has been sent to [facility] and family was to sign paperwork at [place] on 5/23.  7:19 PM CSW called Freda Munro in admissions at ph: [phone] and left two VM's but received no return call back.  CSW called [facility] Paul[facility] Aus to speak to the CN in [place] at ph: [phone] who stated she is unaware of who the pt is but that she would try to contact [facility] herself and get Freda Munro to call the CSW back at 7:28pm.  7:41 PM  CSW updated RN D/C not possible, facility not responding to CSW's requests for assistance.  Please reconsult if future social work needs arise.  CSW signing off, as social work intervention is no longer needed.  Alphonse Guild. Nhan Qualley, LCSW, LCAS, CSI Clinical Social Worker Ph: [phone]

## 2017-06-22 NOTE — Discharge Summary (Signed)
Physician Discharge Summary  Jaime [name]:[id number] DOB: 03-13-1965 DOA: 06/10/2017  PCP: Katherine Roan, MD  Admit date: 06/10/2017 Discharge date: 06/22/2017  Time spent: 35 minutes  Recommendations for Outpatient Follow-up:  1. Ensure f/u with neurologist   Discharge Diagnoses:  Active Problems:   Diabetes mellitus type 2 with retinopathy ([place])   Essential hypertension   Glioblastoma multiforme of temporal lobe (HCC)   Brain mass   Seizures (HCC)   Acute metabolic encephalopathy   Altered mental status   DNR (do not resuscitate)   Pressure injury of skin   Discharge Condition: stable  Diet recommendation: carb modified diet  Filed Weights   06/13/17 0443 06/21/17 0500 06/22/17 0500  Weight: 66.4 kg (146 lb 6.2 oz) 62.3 kg (137 lb 5.6 oz) 63.7 kg (140 lb 6.9 oz)    History of present illness:  52 year old female who presented with increasing confusion.  She does have the significant past medical history for glioblastoma multiforme.  Progressive disease, with confusion and seizures  Hospital Course:  1.  Encephalopathy due to brain edema related to glioblastoma multiforme.  - transitioned to SNF on current regimen.   2.  Lingular pneumonia.  - Patient treated with 4 days of antibiotics. No worsening sob, sputum production.  Patient with no respiratory symptoms on day of discharge.  Should her breathing condition worsened with consider placing her on antibiotic therapy and reassessing with chest x-ray.  3.  Hypertension. Continue blood pressure control as needed hydralazine  4.  Type 2 diabetes mellitus.  - continue prior to admission medication regimen.   5. Tachycardia - ekg showed sinus tachycardia. Resolving.   6 Seizure d/o - vimpat and Keppra  Procedures:  none  Consultations:  Palliative care  Medical oncology  Discharge Exam: Vitals:   06/22/17 0619 06/22/17 1248  BP: 123/88 122/86  Pulse: 91 100  Resp: 16 18  Temp: 98.4 F  (36.9 C) 98.6 F (37 C)  SpO2: 99% 99%    General: Pt in nad, alert and awake Cardiovascular: rrr, no rubs Respiratory: no increased wob, no wheezes  Discharge Instructions   Discharge Instructions    Call MD for:  severe uncontrolled pain   Complete by:  As directed    Call MD for:  temperature >100.4   Complete by:  As directed    Diet - low sodium heart healthy   Complete by:  As directed    Discharge instructions   Complete by:  As directed    Please ensure patient follows up with his primary care physician in SNF and with neurologist.   Increase activity slowly   Complete by:  As directed      Allergies as of 06/22/2017   No Known Allergies     Medication List    STOP taking these medications   aspirin EC 81 MG tablet   LORazepam 0.5 MG tablet Commonly known as:  ATIVAN     TAKE these medications   ACCU-CHEK SOFTCLIX LANCETS lancets USE TO CHECK BLOOD SUGAR FOUR TIMES DAILY   acetaminophen 500 MG tablet Commonly known as:  TYLENOL Take 1 tablet (500 mg total) by mouth every 6 (six) hours as needed for moderate pain.   blood glucose meter kit and supplies Dispense based on patient and insurance preference. Use up to four times daily as directed. (FOR ICD-9 250.00, 250.01).   dexamethasone 4 MG tablet Commonly known as:  DECADRON Take 1 tablet (4 mg total) by mouth every 6 (  six) hours as needed. What changed:    how much to take  when to take this  reasons to take this   famotidine 20 MG tablet Commonly known as:  PEPCID Take 1 tablet (20 mg total) by mouth 2 (two) times daily.   feeding supplement (BOOST BREEZE) Liqd Take 1 Bottle by mouth 3 (three) times daily.   fluticasone 50 MCG/ACT nasal spray Commonly known as:  FLONASE Place 2 sprays into both nostrils daily.   glucose blood test strip Commonly known as:  ACCU-CHEK AVIVA PLUS USE TO TEST BLOOD SUGAR FOUR TIMES DAILY Diagnosis code:E11.319   insulin aspart 100 UNIT/ML  FlexPen Commonly known as:  NOVOLOG FLEXPEN Inject 3-5 Units into the skin 3 (three) times daily with meals.   insulin glargine 100 unit/mL Sopn Commonly known as:  LANTUS Inject 0.17 mLs (17 Units total) into the skin at bedtime.   Insulin Pen Needle 31G X 5 MM Misc Use for injections under the skin as directed. Use 4 times daily. diag code E11.319 insulin dependent   Lacosamide 100 MG Tabs Take 1 tablet (100 mg total) by mouth 2 (two) times daily.   levETIRAcetam 500 MG tablet Commonly known as:  KEPPRA Take 3 tablets (1,500 mg total) by mouth 2 (two) times daily. What changed:  when to take this   lisinopril 40 MG tablet Commonly known as:  PRINIVIL,ZESTRIL Take 40 mg by mouth daily.   loratadine 10 MG tablet Commonly known as:  CLARITIN Take 1 tablet (10 mg total) by mouth daily.   metFORMIN 1000 MG tablet Commonly known as:  GLUCOPHAGE Take 1 tablet (1,000 mg total) by mouth 2 (two) times daily with a meal.   montelukast 10 MG tablet Commonly known as:  SINGULAIR Take 1 tablet (10 mg total) by mouth at bedtime.   ondansetron 4 MG disintegrating tablet Commonly known as:  ZOFRAN ODT Take 1 tablet (4 mg total) by mouth every 8 (eight) hours as needed for nausea or vomiting.   potassium chloride SA 20 MEQ tablet Commonly known as:  K-DUR,KLOR-CON TAKE 1 TABLET BY MOUTH DAILY            Durable Medical Equipment  (From admission, onward)        Start     Ordered   06/13/17 1005  For home use only DME 3 n 1  Once     06/13/17 1005     No Known Allergies Follow-up Information    [facility].   Specialty:  [place] Why:  for home health services Contact information: [address] [place] [address] [phone]            The results of significant diagnostics from this hospitalization (including imaging, microbiology, ancillary and laboratory) are listed below for reference.    Significant  Diagnostic Studies: Dg Chest 2 View  Result Date: 06/21/2017 CLINICAL DATA:  Pneumonia, short of breath EXAM: CHEST - 2 VIEW COMPARISON:  CT 06/10/2017, radiograph 08/20/2016 FINDINGS: Minimal opacity at the lingula and left base. No pleural effusion. Borderline cardiomegaly. Low lung volumes. No pneumothorax. IMPRESSION: Mild lingular and left base opacity, may reflect atelectasis or minimal residual infiltrate. Electronically Signed   By: Donavan Foil M.D.   On: 06/21/2017 18:41   Ct Head Wo Contrast  Result Date: 06/10/2017 CLINICAL DATA:  Altered mental status. 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 EXAM: CT HEAD WITHOUT CONTRAST TECHNIQUE: Contiguous axial images were  obtained from the base of the skull through the vertex without intravenous contrast. COMPARISON:  Brain MRI 06/01/2017.  Head CT scan 06/18/2016. FINDINGS: Brain: Again seen is a large mass lesion in the left temporal lobe with extensive surrounding vasogenic edema. Discrete measurement of this lesion is difficult on uninfused CT scan but it is not grossly changed since the most recent study. There is associated left-to-right midline shift of approximately 0.7 cm, unchanged. Hydrocephalus also appears unchanged. No hemorrhage is identified. Vascular: No hyperdense vessel or unexpected calcification. Skull: Intact. Sinuses/Orbits: Negative. Other: None. IMPRESSION: No marked change in the patient's left temporal lobe glioblastoma multiforme with associated extensive vasogenic edema, left to right midline shift and hydrocephalus since the patient's brain MRI 06/01/2017. Electronically Signed   By: Inge Rise M.D.   On: 06/10/2017 17:08   Ct Angio Chest Pe W Or Wo Contrast  Result Date: 06/11/2017 CLINICAL DATA:  Acute onset of tachycardia.  Mental status changes. EXAM: CT ANGIOGRAPHY CHEST WITH CONTRAST TECHNIQUE: Multidetector CT imaging of the chest was performed using the standard protocol during bolus  administration of intravenous contrast. Multiplanar CT image reconstructions and MIPs were obtained to evaluate the vascular anatomy. CONTRAST:  124m ISOVUE-370 IOPAMIDOL (ISOVUE-370) INJECTION 76% COMPARISON:  CT of the chest performed 06/20/2016 FINDINGS: Cardiovascular:  There is no evidence of pulmonary embolus. The heart is borderline normal in size. The thoracic aorta is grossly unremarkable. The great vessels are within normal limits. Mediastinum/Nodes: The mediastinum is unremarkable in appearance. No mediastinal lymphadenopathy is seen. No pericardial effusion is identified. The visualized portions of the thyroid gland are unremarkable. No axillary lymphadenopathy is seen. Lungs/Pleura: Focal airspace opacity is noted at the left lingula and left lung base. Minimal airspace opacity was noted at the left lingula in 2018. Given its appearance, this more likely reflects pneumonia. Would consider follow-up PA and lateral chest radiograph after completion of treatment for pneumonia. Upper Abdomen: The visualized portions of the liver and spleen are unremarkable. The gallbladder is grossly unremarkable in appearance. The visualized portions of the pancreas and adrenal glands are within normal limits. Mild nonspecific perinephric stranding is noted bilaterally. Musculoskeletal: No acute osseous abnormalities are identified. The visualized musculature is unremarkable in appearance. Review of the MIP images confirms the above findings. IMPRESSION: 1. No evidence of pulmonary embolus. 2. Focal airspace opacity at the left lingula and left air base. This is more prominent than in 2018. Given its appearance, this more likely reflects pneumonia. Followup PA and lateral chest X-ray is recommended in 3-4 weeks following trial of antibiotic therapy to ensure resolution and exclude underlying malignancy. Electronically Signed   By: JGarald BaldingM.D.   On: 06/11/2017 00:08   Mr BJeri CosWRAContrast  Result Date:  06/01/2017 CLINICAL DATA:  Glioblastoma multiform left temporal lobe EXAM: MRI HEAD WITHOUT AND WITH CONTRAST TECHNIQUE: Multiplanar, multiecho pulse sequences of the brain and surrounding structures were obtained without and with intravenous contrast. CONTRAST:  166mMULTIHANCE GADOBENATE DIMEGLUMINE 529 MG/ML IV SOLN COMPARISON:  MRI 04/09/2017, 08/11/2016 FINDINGS: Brain: Marked progression of enhancing tumor in the left medial temporal lobe. Lobular masslike area of enhancement now measures approximately 5.7 x 3.8 x 4.3 cm. This projects into the left lateral ventricle. This component of the mass shows progressive susceptibility suggesting hemorrhage which has developed since the prior study. Significant progression of white matter hyperintensity throughout the left temporal lobe with extension into the left occipital parietal lobe. Nodular masslike enhancement in the perimesencephalic cistern anteriorly and  on the right has progressed compatible with CSF spread of tumor. Small amount of enhancement in the floor of the fourth ventricle may also represent tumor. Progressive hydrocephalus. Progressive midline shift now approximately 7 mm. Progressive periventricular white matter hyperintensity. Some of this is likely due to transependymal resorption of CSF due to hydrocephalus. Negative for acute infarct. Vascular: Normal arterial flow void Skull and upper cervical spine: Negative Sinuses/Orbits: Negative Other: None IMPRESSION: Rapid progression of tumor since the prior study. The main component of the tumor in the left temporal lobe shows significant progression in size in interval hemorrhage. Extensive white matter hyperintensity throughout the left temporal lobe extending into the left occipital parietal lobes also has progressed. Increased mass-effect and 7 mm midline shift to the right. 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 Progressive hydrocephalus  These results will be called to the ordering clinician or representative by the Radiologist Assistant, and communication documented in the PACS or zVision Dashboard. Electronically Signed   By: Franchot Gallo M.D.   On: 06/01/2017 17:23    Microbiology: No results found for this or any previous visit (from the past 240 hour(s)).   Labs: Basic Metabolic Panel: Recent Labs  Lab 06/17/17 0512 06/19/17 0911  NA  --  137  K  --  4.1  CL  --  100*  CO2  --  27  GLUCOSE  --  223*  BUN  --  16  CREATININE 0.39* 0.42*  CALCIUM  --  9.2   Liver Function Tests: No results for input(s): AST, ALT, ALKPHOS, BILITOT, PROT, ALBUMIN in the last 168 hours. No results for input(s): LIPASE, AMYLASE in the last 168 hours. No results for input(s): AMMONIA in the last 168 hours. CBC: Recent Labs  Lab 06/19/17 0911  WBC 12.6*  HGB 14.0  HCT 41.2  MCV 87.8  PLT 212   Cardiac Enzymes: No results for input(s): CKTOTAL, CKMB, CKMBINDEX, TROPONINI in the last 168 hours. BNP: BNP (last 3 results) Recent Labs    08/20/16 1545  BNP 39.9    ProBNP (last 3 results) No results for input(s): PROBNP in the last 8760 hours.  CBG: Recent Labs  Lab 06/21/17 1227 06/21/17 1700 06/21/17 2143 06/22/17 0744 06/22/17 1131  GLUCAP 144* 188* 181* 100* 151*     Signed:  Velvet Bathe MD.  [facility] 06/22/2017, 5:13 PM

## 2017-06-23 LAB — GLUCOSE, CAPILLARY
GLUCOSE-CAPILLARY: 192 mg/dL — AB (ref 65–99)
Glucose-Capillary: 97 mg/dL (ref 65–99)

## 2017-06-23 NOTE — Progress Notes (Signed)
Discharge Planning: Contacted [facility] to make aware of dc to SNF. CSW following for SNF placement.Jonnie Finner RN CCM Case Mgmt phone [phone]

## 2017-06-23 NOTE — Progress Notes (Signed)
Attempted to call report to [facility] no answer. Patient being discharged via [facility]

## 2017-06-23 NOTE — Clinical Social Work Placement (Addendum)
Pt discharged 06/22/17- facility could not admit pt due to admissions/pharmacy closed after 5pm.  Confirmed with [facility] prepared for pt to admit today. Report [phone] Pt's mother at bedside aware and agreeable to plan. DC information provided via the [facility] arrange [facility] transport   CLINICAL SOCIAL WORK PLACEMENT  NOTE  Date:  06/23/2017  Patient Details  Name: Jaime [name] MRN: [id number] Date of Birth: 1965/05/11  Clinical Social Work is seeking post-discharge placement for this patient at the [employer] level of care (*CSW will initial, date and re-position this form in  chart as items are completed):  Yes   Patient/family provided with [place] Work Department's list of facilities offering this level of care within the geographic area requested by the patient (or if unable, by the patient's family).  Yes   Patient/family informed of their freedom to choose among providers that offer the needed level of care, that participate in Medicare, Medicaid or managed care program needed by the patient, have an available bed and are willing to accept the patient.  Yes   Patient/family informed of [facility]'s ownership interest in [facility] and [facility], as well as of the fact that they are under no obligation to receive care at these facilities.  PASRR submitted to [employer] on 06/14/17     PASRR number received on 06/21/17     Existing PASRR number confirmed on       FL2 transmitted to all facilities in geographic area requested by pt/family on 06/14/17     FL2 transmitted to all facilities within larger geographic area on       Patient informed that his/her managed care company has contracts with or will negotiate with certain facilities, including the following:        Yes   Patient/family informed of bed offers received.  Patient chooses bed at [facility]     Physician recommends and patient chooses bed at [facility]      Patient to be transferred to [facility] on 06/23/17.  Patient to be transferred to facility by [facility]     Patient family notified on 06/23/17 of transfer.  Name of family member notified:  mother Kendrick Fries     PHYSICIAN Please sign DNR     Additional Comment:    _______________________________________________ Nila Nephew, LCSW 06/23/2017, 10:31 AM [phone] weekend coverage

## 2017-06-25 ENCOUNTER — Emergency Department (HOSPITAL_COMMUNITY): Payer: Medicaid Other

## 2017-06-25 ENCOUNTER — Encounter (HOSPITAL_COMMUNITY): Payer: Self-pay | Admitting: Emergency Medicine

## 2017-06-25 ENCOUNTER — Inpatient Hospital Stay (HOSPITAL_COMMUNITY)
Admission: EM | Admit: 2017-06-25 | Discharge: 2017-06-28 | DRG: 054 | Disposition: A | Discharge status: Hospice | Payer: Medicaid Other | Source: Skilled Nursing Facility | Attending: Internal Medicine | Admitting: Internal Medicine

## 2017-06-25 DIAGNOSIS — J690 Pneumonitis due to inhalation of food and vomit: Secondary | ICD-10-CM | POA: Diagnosis present

## 2017-06-25 DIAGNOSIS — Z832 Family history of diseases of the blood and blood-forming organs and certain disorders involving the immune mechanism: Secondary | ICD-10-CM | POA: Diagnosis not present

## 2017-06-25 DIAGNOSIS — G9341 Metabolic encephalopathy: Secondary | ICD-10-CM | POA: Diagnosis present

## 2017-06-25 DIAGNOSIS — R000 Tachycardia, unspecified: Secondary | ICD-10-CM | POA: Diagnosis present

## 2017-06-25 DIAGNOSIS — E11319 Type 2 diabetes mellitus with unspecified diabetic retinopathy without macular edema: Secondary | ICD-10-CM | POA: Diagnosis present

## 2017-06-25 DIAGNOSIS — Z8249 Family history of ischemic heart disease and other diseases of the circulatory system: Secondary | ICD-10-CM

## 2017-06-25 DIAGNOSIS — K219 Gastro-esophageal reflux disease without esophagitis: Secondary | ICD-10-CM | POA: Diagnosis present

## 2017-06-25 DIAGNOSIS — Z794 Long term (current) use of insulin: Secondary | ICD-10-CM | POA: Diagnosis not present

## 2017-06-25 DIAGNOSIS — I10 Essential (primary) hypertension: Secondary | ICD-10-CM | POA: Diagnosis present

## 2017-06-25 DIAGNOSIS — C712 Malignant neoplasm of temporal lobe: Secondary | ICD-10-CM | POA: Diagnosis not present

## 2017-06-25 DIAGNOSIS — F79 Unspecified intellectual disabilities: Secondary | ICD-10-CM | POA: Diagnosis present

## 2017-06-25 DIAGNOSIS — J9601 Acute respiratory failure with hypoxia: Secondary | ICD-10-CM | POA: Diagnosis not present

## 2017-06-25 DIAGNOSIS — Z79899 Other long term (current) drug therapy: Secondary | ICD-10-CM | POA: Diagnosis not present

## 2017-06-25 DIAGNOSIS — D573 Sickle-cell trait: Secondary | ICD-10-CM | POA: Diagnosis present

## 2017-06-25 DIAGNOSIS — R569 Unspecified convulsions: Secondary | ICD-10-CM

## 2017-06-25 DIAGNOSIS — Z66 Do not resuscitate: Secondary | ICD-10-CM | POA: Diagnosis present

## 2017-06-25 DIAGNOSIS — Z7189 Other specified counseling: Secondary | ICD-10-CM

## 2017-06-25 DIAGNOSIS — Z515 Encounter for palliative care: Secondary | ICD-10-CM | POA: Diagnosis not present

## 2017-06-25 DIAGNOSIS — J189 Pneumonia, unspecified organism: Secondary | ICD-10-CM | POA: Insufficient documentation

## 2017-06-25 LAB — COMPREHENSIVE METABOLIC PANEL
ALT: 120 U/L — AB (ref 14–54)
AST: 31 U/L (ref 15–41)
Albumin: 2.9 g/dL — ABNORMAL LOW (ref 3.5–5.0)
Alkaline Phosphatase: 177 U/L — ABNORMAL HIGH (ref 38–126)
Anion gap: 13 (ref 5–15)
BUN: 22 mg/dL — AB (ref 6–20)
CHLORIDE: 104 mmol/L (ref 101–111)
CO2: 25 mmol/L (ref 22–32)
CREATININE: 0.55 mg/dL (ref 0.44–1.00)
Calcium: 9.4 mg/dL (ref 8.9–10.3)
GFR calc Af Amer: >60 mL/min (ref >60)
Glucose, Bld: 205 mg/dL — ABNORMAL HIGH (ref 65–99)
Potassium: 3.5 mmol/L (ref 3.5–5.1)
Sodium: 142 mmol/L (ref 135–145)
Total Bilirubin: 1 mg/dL (ref 0.3–1.2)
Total Protein: 6.4 g/dL — ABNORMAL LOW (ref 6.5–8.1)

## 2017-06-25 LAB — CBC WITH DIFFERENTIAL/PLATELET
Basophils Absolute: 0 10*3/uL (ref 0.0–0.1)
Basophils Relative: 0 %
Eosinophils Absolute: 0 10*3/uL (ref 0.0–0.7)
Eosinophils Relative: 0 %
HEMATOCRIT: 40.6 % (ref 36.0–46.0)
Hemoglobin: 13.5 g/dL (ref 12.0–15.0)
LYMPHS ABS: 0.6 10*3/uL — AB (ref 0.7–4.0)
Lymphocytes Relative: 3 %
MCH: 29.6 pg (ref 26.0–34.0)
MCHC: 33.3 g/dL (ref 30.0–36.0)
MCV: 89 fL (ref 78.0–100.0)
MONOS PCT: 2 %
Monocytes Absolute: 0.4 10*3/uL (ref 0.1–1.0)
NEUTROS ABS: 18.2 10*3/uL — AB (ref 1.7–7.7)
NEUTROS PCT: 95 %
PLATELETS: 264 10*3/uL (ref 150–400)
RBC: 4.56 MIL/uL (ref 3.87–5.11)
RDW: 14.5 % (ref 11.5–15.5)
WBC: 19.2 10*3/uL — ABNORMAL HIGH (ref 4.0–10.5)

## 2017-06-25 LAB — I-STAT TROPONIN, ED: Troponin i, poc: 0.03 ng/mL (ref 0.00–0.08)

## 2017-06-25 LAB — I-STAT CG4 LACTIC ACID, ED: LACTIC ACID, VENOUS: 1.14 mmol/L (ref 0.5–1.9)

## 2017-06-25 MED ORDER — ONDANSETRON HCL 4 MG/2ML IJ SOLN
4.0000 mg | Freq: Four times a day (QID) | INTRAMUSCULAR | Status: DC | PRN
  Administered 2017-06-26: 4 mg via INTRAVENOUS
  Filled 2017-06-25: qty 2

## 2017-06-25 MED ORDER — DEXAMETHASONE SODIUM PHOSPHATE 10 MG/ML IJ SOLN
10.0000 mg | Freq: Once | INTRAMUSCULAR | Status: AC
  Administered 2017-06-25: 10 mg via INTRAVENOUS
  Filled 2017-06-25: qty 1

## 2017-06-25 MED ORDER — SODIUM CHLORIDE 0.9 % IV BOLUS
1000.0000 mL | Freq: Once | INTRAVENOUS | Status: AC
  Administered 2017-06-25: 1000 mL via INTRAVENOUS

## 2017-06-25 MED ORDER — ENOXAPARIN SODIUM 40 MG/0.4ML ~~LOC~~ SOLN
40.0000 mg | Freq: Every day | SUBCUTANEOUS | Status: DC
  Administered 2017-06-26 – 2017-06-27 (×2): 40 mg via SUBCUTANEOUS
  Filled 2017-06-25: qty 0.4

## 2017-06-25 MED ORDER — DEXAMETHASONE SODIUM PHOSPHATE 10 MG/ML IJ SOLN
4.0000 mg | Freq: Four times a day (QID) | INTRAMUSCULAR | Status: DC
  Administered 2017-06-26 – 2017-06-28 (×11): 4 mg via INTRAVENOUS
  Filled 2017-06-25 (×11): qty 1

## 2017-06-25 MED ORDER — HYDRALAZINE HCL 20 MG/ML IJ SOLN: 10.0000 mg | INTRAMUSCULAR | Status: DC | PRN

## 2017-06-25 MED ORDER — ONDANSETRON HCL 4 MG PO TABS: 4.0000 mg | ORAL_TABLET | Freq: Four times a day (QID) | ORAL | Status: DC | PRN

## 2017-06-25 MED ORDER — VANCOMYCIN HCL IN DEXTROSE 1-5 GM/200ML-% IV SOLN
1000.0000 mg | Freq: Once | INTRAVENOUS | Status: AC
  Administered 2017-06-25: 1000 mg via INTRAVENOUS
  Filled 2017-06-25: qty 200

## 2017-06-25 MED ORDER — ACETAMINOPHEN 325 MG PO TABS: 650.0000 mg | ORAL_TABLET | Freq: Four times a day (QID) | ORAL | Status: DC | PRN

## 2017-06-25 MED ORDER — ACETAMINOPHEN 650 MG RE SUPP: 650.0000 mg | Freq: Four times a day (QID) | RECTAL | Status: DC | PRN

## 2017-06-25 MED ORDER — INSULIN ASPART 100 UNIT/ML ~~LOC~~ SOLN
0.0000 [IU] | SUBCUTANEOUS | Status: DC
  Administered 2017-06-26: 1 [IU] via SUBCUTANEOUS
  Administered 2017-06-26: 2 [IU] via SUBCUTANEOUS
  Administered 2017-06-26: 1 [IU] via SUBCUTANEOUS
  Administered 2017-06-26: 2 [IU] via SUBCUTANEOUS
  Administered 2017-06-27: 1 [IU] via SUBCUTANEOUS
  Administered 2017-06-27 (×2): 2 [IU] via SUBCUTANEOUS

## 2017-06-25 MED ORDER — IOPAMIDOL (ISOVUE-370) INJECTION 76%
100.0000 mL | Freq: Once | INTRAVENOUS | Status: AC | PRN
  Administered 2017-06-25: 80 mL via INTRAVENOUS

## 2017-06-25 MED ORDER — SODIUM CHLORIDE 0.9 % IV SOLN
2.0000 g | Freq: Once | INTRAVENOUS | Status: AC
  Administered 2017-06-25: 2 g via INTRAVENOUS
  Filled 2017-06-25: qty 2

## 2017-06-25 MED ORDER — SODIUM CHLORIDE 0.9 % IV SOLN
100.0000 mg | Freq: Two times a day (BID) | INTRAVENOUS | Status: DC
  Administered 2017-06-26 – 2017-06-28 (×6): 100 mg via INTRAVENOUS
  Filled 2017-06-25 (×8): qty 10

## 2017-06-25 MED ORDER — IOPAMIDOL (ISOVUE-370) INJECTION 76%
INTRAVENOUS | Status: AC
  Filled 2017-06-25: qty 100

## 2017-06-25 MED ORDER — SODIUM CHLORIDE 0.9 % IV SOLN
INTRAVENOUS | Status: DC
  Administered 2017-06-26: via INTRAVENOUS

## 2017-06-25 MED ORDER — LEVETIRACETAM IN NACL 1500 MG/100ML IV SOLN
1500.0000 mg | Freq: Three times a day (TID) | INTRAVENOUS | Status: DC
  Administered 2017-06-26: 1500 mg via INTRAVENOUS
  Filled 2017-06-25 (×2): qty 100

## 2017-06-25 NOTE — H&P (Addendum)
History and Physical    Jaime [name] WYO:[id number] DOB: 14-Oct-1965 DOA: 06/25/2017  PCP: Katherine Roan, MD  Patient coming from: Skilled nursing facility.  Chief Complaint: Hypoxia and decreased response.  History obtained from patient's mother.  HPI: Jaime [name] is a 52 y.o. female with history of glioblastoma multiforme, seizures and diabetes mellitus who was recently admitted for encephalopathy at the time patient's seizure medication was increased and Vimpat was added and discharged to skilled nursing facility was brought to the ER after patient was found to be getting more hypoxic and altered mental status.  As per the patient's mother patient has become decreased responsive last 3 days and has hardly eating anything.  Last time she responded to patient's mother was 3 days ago.  Today patient was noticed to be hypoxic and was referred to the hospital back.  ED Course: In the ER patient had CT head and CT chest CT head does not show any progression of the glioblastoma.  CT chest shows.  Features concerning for aspiration pneumonia.  Patient was started on empiric antibiotics.  On my exam patient is hardly responsive and patient's mother states his been make that for last 3 days.  Review of Systems: As per HPI, rest all negative.   Past Medical History:  Diagnosis Date  . Candida, oral 06/22/2016   Due to steroids 06/22/16  . Diabetes mellitus   . DUB (dysfunctional uterine bleeding)    total abdominal hysterectomy in 2007, ovaries intact  . Furuncle of multiple sites    multiple I and D's  . GERD (gastroesophageal reflux disease)   . Hydradenitis   . Intellectual disability   . 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 ABDOMINAL HYSTERECTOMY    . HYDRADENITIS EXCISION    . TONSILLECTOMY AND ADENOIDECTOMY     in early childhood     reports that she has never smoked. She has never used smokeless  tobacco. She reports that she does not drink alcohol or use drugs.  No Known Allergies  Family History  Problem Relation Age of Onset  . Cancer Mother 1       Breast cancer  . Hypertension Mother   . Depression Mother   . Sickle cell trait Mother   . Cancer Father 23       lung cancer, was a long term smoker    Prior to Admission medications   Medication Sig Start Date End Date Taking? Authorizing Provider  acetaminophen (TYLENOL) 500 MG tablet Take 1 tablet (500 mg total) by mouth every 6 (six) hours as needed for moderate pain. 02/26/17  Yes Hoffman, Jessica Ratliff, DO  dexamethasone (DECADRON) 4 MG tablet Take 1 tablet (4 mg total) by mouth every 6 (six) hours as needed. 06/22/17  Yes Velvet Bathe, MD  famotidine (PEPCID) 20 MG tablet Take 1 tablet (20 mg total) by mouth 2 (two) times daily. 07/03/16  Yes Minus Liberty, MD  fluticasone [facility]) 50 MCG/ACT nasal spray Place 2 sprays into both nostrils daily. 06/08/16  Yes Barnet Glasgow, NP  insulin aspart (NOVOLOG FLEXPEN) 100 UNIT/ML FlexPen Inject 3-5 Units into the skin 3 (three) times daily with meals. Patient taking differently: Inject 2-12 Units into the skin 3 (three) times daily with meals. 201-250=2 units, 251-300=4 units, 301-350=6 units, 351-400=8 units, 401-450=10 units, >450=12 units and Repeat CBG in 2 hours. If still above 450 CALL MD. 04/09/17 10/06/17 Yes Katherine Roan,  MD  insulin glargine (LANTUS) 100 unit/mL SOPN Inject 0.17 mLs (17 Units total) into the skin at bedtime. 04/09/17  Yes Katherine Roan, MD  lacosamide 100 MG TABS Take 1 tablet (100 mg total) by mouth 2 (two) times daily. 06/22/17  Yes Velvet Bathe, MD  levETIRAcetam (KEPPRA) 500 MG tablet Take 3 tablets (1,500 mg total) by mouth 2 (two) times daily. Patient taking differently: Take 1,500 mg by mouth 3 (three) times daily.  06/05/17  Yes Vaslow, Acey Lav, MD  lisinopril (PRINIVIL,ZESTRIL) 40 MG tablet Take 40 mg by mouth daily.   Yes  [provider]  loratadine (CLARITIN) 10 MG tablet Take 1 tablet (10 mg total) by mouth daily. 07/03/16  Yes Minus Liberty, MD  metFORMIN (GLUCOPHAGE) 1000 MG tablet Take 1 tablet (1,000 mg total) by mouth 2 (two) times daily with a meal. 02/20/17 11/17/17 Yes Winfrey, Jenne Pane, MD  montelukast (SINGULAIR) 10 MG tablet Take 1 tablet (10 mg total) by mouth at bedtime. 01/10/17  Yes Ladell Pier, MD  Nutritional Supplements (FEEDING SUPPLEMENT, BOOST BREEZE,) LIQD Take 1 Bottle by mouth 3 (three) times daily.  08/17/16  Yes [provider]  ondansetron (ZOFRAN ODT) 4 MG disintegrating tablet Take 1 tablet (4 mg total) by mouth every 8 (eight) hours as needed for nausea or vomiting. 07/24/16  Yes Ladell Pier, MD  potassium chloride SA (K-DUR,KLOR-CON) 20 MEQ tablet TAKE 1 TABLET BY MOUTH DAILY 05/10/17  Yes Ladell Pier, MD  ACCU-CHEK SOFTCLIX LANCETS lancets USE TO CHECK BLOOD SUGAR FOUR TIMES DAILY 07/11/16   Minus Liberty, MD  blood glucose meter kit and supplies Dispense based on patient and insurance preference. Use up to four times daily as directed. (FOR ICD-9 250.00, 250.01). 06/21/16   Holley Raring, MD  glucose blood (ACCU-CHEK AVIVA PLUS) test strip USE TO TEST BLOOD SUGAR FOUR TIMES DAILY Diagnosis code:E11.319 02/13/17   Katherine Roan, MD  Insulin Pen Needle 31G X 5 MM MISC Use for injections under the skin as directed. Use 4 times daily. diag code E11.319 insulin dependent 02/13/17   Katherine Roan, MD    Physical Exam: Vitals:   06/25/17 1900 06/25/17 1930 06/25/17 2102 06/25/17 2236  BP: 95/61 100/65 109/74 110/72  Pulse: (!) 105 (!) 108 (!) 103 (!) 110  Resp: (!) 21 (!) 22 (!) 21 (!) 22  Temp:      TempSrc:      SpO2: 97% 95% 94% 91%      Constitutional: Moderately built and nourished. Vitals:   06/25/17 1900 06/25/17 1930 06/25/17 2102 06/25/17 2236  BP: 95/61 100/65 109/74 110/72  Pulse: (!) 105 (!) 108 (!) 103 (!) 110  Resp:  (!) 21 (!) 22 (!) 21 (!) 22  Temp:      TempSrc:      SpO2: 97% 95% 94% 91%   Eyes: Anicteric no pallor.  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 ENMT: No discharge from the ears eyes nose or mouth. Neck: No mass felt.  No neck rigidity. Respiratory: No rhonchi or crepitations. Cardiovascular: S1-S2 heard no murmurs appreciated. Abdomen: Soft nontender bowel sounds present. Musculoskeletal: No edema.  No joint effusion. Skin: No rash.  Skin appears warm. Neurologic: Patient is encephalopathic and does not follow commands.  Pupils are reacting.  Psychiatric: Patient is encephalopathic.   Labs on Admission: I have personally reviewed following labs and imaging studies  CBC: Recent Labs  Lab 06/19/17 0911 06/25/17 1807  WBC 12.6* 19.2*  NEUTROABS  --  18.2*  HGB 14.0 13.5  HCT 41.2 40.6  MCV 87.8 89.0  PLT 212 035   Basic Metabolic Panel: Recent Labs  Lab 06/19/17 0911 06/25/17 1807  NA 137 142  K 4.1 3.5  CL 100* 104  CO2 27 25  GLUCOSE 223* 205*  BUN 16 22*  CREATININE 0.42* 0.55  CALCIUM 9.2 9.4   GFR: Estimated Creatinine Clearance: 73.7 mL/min (by C-G formula based on SCr of 0.55 mg/dL). Liver Function Tests: Recent Labs  Lab 06/25/17 1807  AST 31  ALT 120*  ALKPHOS 177*  BILITOT 1.0  PROT 6.4*  ALBUMIN 2.9*   No results for input(s): LIPASE, AMYLASE in the last 168 hours. No results for input(s): AMMONIA in the last 168 hours. Coagulation Profile: No results for input(s): INR, PROTIME in the last 168 hours. Cardiac Enzymes: No results for input(s): CKTOTAL, CKMB, CKMBINDEX, TROPONINI in the last 168 hours. BNP (last 3 results) No results for input(s): PROBNP in the last 8760 hours. HbA1C: No results for input(s): HGBA1C in the last 72 hours. CBG: Recent Labs  Lab 06/22/17 1131 06/22/17 1732 06/22/17 2151 06/23/17 0737 06/23/17 1130  GLUCAP 151* 163* 135* 97 192*   Lipid Profile: No results for input(s): CHOL, HDL,  LDLCALC, TRIG, CHOLHDL, LDLDIRECT in the last 72 hours. Thyroid Function Tests: No results for input(s): TSH, T4TOTAL, FREET4, T3FREE, THYROIDAB in the last 72 hours. Anemia Panel: No results for input(s): VITAMINB12, FOLATE, FERRITIN, TIBC, IRON, RETICCTPCT in the last 72 hours. Urine analysis:    Component Value Date/Time   COLORURINE YELLOW 08/20/2016 0827   APPEARANCEUR HAZY (A) 08/20/2016 0827   LABSPEC 1.010 08/20/2016 0827   LABSPEC 1.010 08/09/2016 1502   PHURINE 5.0 08/20/2016 0827   GLUCOSEU 50 (A) 08/20/2016 0827   GLUCOSEU Negative 08/09/2016 1502   HGBUR NEGATIVE 08/20/2016 0827   BILIRUBINUR NEGATIVE 08/20/2016 0827   BILIRUBINUR Negative 08/09/2016 1502   KETONESUR NEGATIVE 08/20/2016 0827   PROTEINUR NEGATIVE 08/20/2016 0827   UROBILINOGEN 0.2 08/09/2016 1502   NITRITE NEGATIVE 08/20/2016 0827   LEUKOCYTESUR MODERATE (A) 08/20/2016 0827   LEUKOCYTESUR Negative 08/09/2016 1502   Sepsis Labs: '@LABRCNTIP'$ (procalcitonin:4,lacticidven:4) )No results found for this or any previous visit (from the past 240 hour(s)).   Radiological Exams on Admission: Ct Head Wo Contrast  Result Date: 06/25/2017 CLINICAL DATA:  Decreasing level of consciousness.  History of GBM. EXAM: CT HEAD WITHOUT CONTRAST TECHNIQUE: Contiguous axial images were obtained from the base of the skull through the vertex without intravenous contrast. COMPARISON:  Multiple priors. Most recent MR 06/01/2017. Most recent CT 06/10/2017. 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 Communicating hydrocephalus, with enlargement of the lateral, third, and fourth ventricles appears similar, with transependymal absorption. Moderate surrounding vasogenic edema extends into the temporal, parietal, and occipital lobes. 5 mm of LEFT-to-RIGHT shift compares favorably with most recent CT. No confound in hemorrhage. No extra-axial fluid.  Hydrocephalus not significantly worse. Vascular: No hyperdense vessel or unexpected calcification. Skull: No fracture or focal lesion. Sinuses/Orbits: No sinus fluid.  Negative orbits. Other: None. IMPRESSION: Recurrent glioblastoma multiforme not significantly progressed from prior CT or MR. No intervening hemorrhage. LEFT-to-RIGHT shift essentially stable. No worsening hydrocephalus. Electronically Signed   By: Staci Righter M.D.   On: 06/25/2017 20:42   Ct Angio Chest Pe W And/or Wo Contrast  Result Date: 06/25/2017 CLINICAL DATA:  Evaluate for pulmonary embolus.  EXAM: CT ANGIOGRAPHY CHEST WITH CONTRAST TECHNIQUE: Multidetector CT imaging of the chest was performed using the standard protocol during bolus administration of intravenous contrast. Multiplanar CT image reconstructions and MIPs were obtained to evaluate the vascular anatomy. CONTRAST:  42m ISOVUE-370 IOPAMIDOL (ISOVUE-370) INJECTION 76% COMPARISON:  06/10/2017 FINDINGS: Cardiovascular: Mild cardiac enlargement. No pericardial effusion. The main pulmonary artery is patent. No central obstructing embolus. No lobar or segmental pulmonary artery filling defects identified to suggest acute pulmonary embolus. Mediastinum/Nodes: Normal appearance of the thyroid gland. The trachea appears patent and is midline. Normal appearance of the esophagus. No enlarged mediastinal or hilar lymph nodes. Lungs/Pleura: The left mainstem bronchus from the level of the carina and the left lower lobe bronchi are completely occluded with debris. There is dense airspace consolidation involving nearly the entire left lower lobe. No discrete mass is identified. There is also opacification scratch set there also filling defects identified within the left upper lobe airway without significant left upper lobe atelectasis or consolidation. Patchy areas of ground-glass attenuation noted scattered within the right lung. Upper Abdomen: No acute abnormality. Musculoskeletal: No  chest wall abnormality. No acute or significant osseous findings. Review of the MIP images confirms the above findings. IMPRESSION: 1. No evidence for acute pulmonary embolus. 2. Dense airspace consolidation involving much of the entire left lower lobe is identified. Findings are compatible with aspiration and/or pneumonia. 3. Occlusion of the left mainstem bronchus and its branches are noted extending into the left upper lobe and left lower lobe which may represent aspiration. Follow-up imaging is advised two ensure complete resolution and to rule out underlying neoplastic process. Electronically Signed   By: TKerby MoorsM.D.   On: 06/25/2017 20:41   Dg Chest Port 1 View  Result Date: 06/25/2017 CLINICAL DATA:  Shortness of breath and hypoxia. EXAM: PORTABLE CHEST 1 VIEW COMPARISON:  06/21/17. FINDINGS: The heart size and mediastinal contours are within normal limits. No pleural effusion. Left base opacity is new compared with previous exam. Mild subsegmental atelectasis noted in the lung bases. IMPRESSION: 1. Left base opacity suspicious for pneumonia. 2. Bibasilar atelectasis. Electronically Signed   By: TKerby MoorsM.D.   On: 06/25/2017 17:52    EKG: Independently reviewed.  Sinus tachycardia with nonspecific ST-T changes.  Assessment/Plan Principal Problem:   Acute respiratory failure with hypoxia (HCC) Active Problems:   Diabetes mellitus type 2 with retinopathy ([place]   DNR (do not resuscitate) discussion   G[place]e of [place]   [place]   Acute metabolic encephalopathy    1. Acute encephalopathy -likely from progression of patient's known history of glioblastoma.  Patient also has history of seizures no seizure-like activity seen at this time.  Will get EEG.  I have dosed patient's Keppra and Vimpat IV at this time.  Will discuss with patient's neuro oncologist.  Patient's mother has indicated no active measures but to continue present treatment and  goal is to make comfortable. 2. Acute respiratory failure with hypoxia likely from aspiration pneumonia for which patient is placed on empiric antibiotics.  Once patient is more alert awake will need to get a swallow evaluation. 3. Diabetes mellitus type 2 -since patient is n.p.o. at this time will check CBGs every 4 with sliding scale coverage. 4. Glioblastoma multiforme is being followed by Dr. VMickeal Skinner  Patient is presently on IV Decadron.  Addendum -discussed with Dr. VMickeal Skinneroncologist advised patient is to be continued with her seizure medications and Decadron but otherwise no other recommendations  but patient has progressive cancer and to obtain hospice care.  May call Dr. Mickeal Skinner for any further recommendations.  Patient's Keppra dose adjusted after discussing Dr. Mickeal Skinner.   DVT prophylaxis: Lovenox. Code Status: DNR.  Confirmed with patient's mother. Family Communication: Patient's mother. Disposition Plan: To be determined. Consults called: Palliative care. Admission status: Inpatient.   Rise Patience MD [facility] Pager [phone].  If 7PM-7AM, please contact night-coverage [email or web address] Password [email or web address]  06/25/2017, 11:30 PM

## 2017-06-25 NOTE — ED Triage Notes (Signed)
Per [facility] pt from [facility] for resp distress and lethargic x 3 days. Patient O2 80% on room air, placed on nasal canula 90%s.  Pt has brain tumor and on pallative care and has DNR. SNF staff told EMS that doctor on call stated to not do any measures but send pt out for evaluation. Pt has 20g left AC. Vitals 98/60, HR116, R 28, 96% on 4L, CBG 160.

## 2017-06-25 NOTE — ED Notes (Signed)
Bed: XY33 Expected date:  Expected time:  Means of arrival:  Comments: 52 yo lethargic

## 2017-06-25 NOTE — ED Triage Notes (Signed)
Pt mother here stating that patient was in hospital from Mother's Day to this past weekend. Pt has brain tumor and causing gait imbalances and falling as well as seizures.  Pt hasnt been eating and been "more sleepy the past few days".

## 2017-06-25 NOTE — ED Notes (Signed)
ED TO INPATIENT HANDOFF REPORT  Name/Age/Gender Jaime [name] 52 y.o. female  Code Status    Code Status Orders  (From admission, onward)        Start     Ordered   06/25/17 2101  Do not attempt resuscitation/DNR  Continuous    Question Answer Comment  In the event of cardiac or respiratory ARREST Do not call a "code blue"   In the event of cardiac or respiratory ARREST Do not perform Intubation, CPR, defibrillation or ACLS   In the event of cardiac or respiratory ARREST Use medication by any route, position, wound care, and other measures to relive pain and suffering. May use oxygen, suction and manual treatment of airway obstruction as needed for comfort.      06/25/17 2100    Code Status History    Date Active Date Inactive Code Status Order ID Comments User Context   06/10/2017 2243 06/23/2017 1555 DNR [id number]  Rise Patience, MD ED   08/20/2016 1532 08/26/2016 1632 DNR [id number]  Benito Mccreedy, MD Inpatient   06/18/2016 0543 06/22/2016 2134 Full Code [id number]  Omar Person, NP ED    Advance Directive Documentation     Most Recent Value  Type of Advance Directive  Healthcare Power of [place], Out of facility DNR (pink MOST or yellow form)  Pre-existing out of facility DNR order (yellow form or pink MOST form)  Yellow form placed in chart (order not valid for inpatient use)  "MOST" Form in Place?  -      Home/SNF/Other Nursing Home  Chief Complaint Low stats; resp   Level of Care/Admitting Diagnosis ED Disposition    ED Disposition Condition Comment   Admit  The patient appears reasonably stabilized for admission considering the current resources, flow, and capabilities available in the ED at this time, and I doubt any other [facility] requiring further screening and/or treatment in the ED prior to admission is  present.       Medical History Past Medical History:  Diagnosis Date  . Candida, oral 06/22/2016   Due to steroids 06/22/16  . Diabetes  mellitus   . DUB (dysfunctional uterine bleeding)    total abdominal hysterectomy in 2007, ovaries intact  . Furuncle of multiple sites    multiple I and D's  . GERD (gastroesophageal reflux disease)   . Hydradenitis   . Intellectual disability   . 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   06/15/17    0023     10   Wound / Incision (Open or Dehisced) 08/20/16 Non-pressure wound Buttocks Right;Left IAD   08/20/16    2100    Buttocks   309          Labs/Imaging Results for orders  placed or performed during the hospital encounter of 06/25/17 (from the past 48 hour(s))  I-stat troponin, ED     Status: None   Collection Time: 06/25/17  6:05 PM  Result Value Ref Range   Troponin i, poc 0.03 0.00 - 0.08 ng/mL   Comment 3            Comment: Due to the release kinetics of cTnI, a negative result within the first hours of the onset of symptoms does not rule out myocardial infarction with certainty. If myocardial infarction is still suspected, repeat the test at appropriate intervals.   CBC with Differential/Platelet     Status: Abnormal   Collection Time: 06/25/17  6:07 PM  Result Value Ref Range   WBC 19.2 (H) 4.0 - 10.5 K/uL   RBC 4.56 3.87 - 5.11 MIL/uL   Hemoglobin 13.5 12.0 - 15.0 g/dL   HCT 40.6 36.0 - 46.0 %   MCV 89.0 78.0 - 100.0 fL   MCH 29.6 26.0 - 34.0 pg   MCHC 33.3 30.0 - 36.0 g/dL   RDW 14.5 11.5 -  15.5 %   Platelets 264 150 - 400 K/uL   Neutrophils Relative % 95 %   Neutro Abs 18.2 (H) 1.7 - 7.7 K/uL   Lymphocytes Relative 3 %   Lymphs Abs 0.6 (L) 0.7 - 4.0 K/uL   Monocytes Relative 2 %   Monocytes Absolute 0.4 0.1 - 1.0 K/uL   Eosinophils Relative 0 %   Eosinophils Absolute 0.0 0.0 - 0.7 K/uL   Basophils Relative 0 %   Basophils Absolute 0.0 0.0 - 0.1 K/uL    Comment: Performed at [facility], [place] [address] [place], [place] [address]  Comprehensive metabolic panel     Status: Abnormal   Collection Time: 06/25/17  6:07 PM  Result Value Ref Range   Sodium 142 135 - 145 mmol/L   Potassium 3.5 3.5 - 5.1 mmol/L   Chloride 104 101 - 111 mmol/L   CO2 25 22 - 32 mmol/L   Glucose, Bld 205 (H) 65 - 99 mg/dL   BUN 22 (H) 6 - 20 mg/dL   Creatinine, Ser 0.55 0.44 - 1.00 mg/dL   Calcium 9.4 8.9 - 10.3 mg/dL   Total Protein 6.4 (L) 6.5 - 8.1 g/dL   Albumin 2.9 (L) 3.5 - 5.0 g/dL   AST 31 15 - 41 U/L   ALT 120 (H) 14 - 54 U/L   Alkaline Phosphatase 177 (H) 38 - 126 U/L   Total Bilirubin 1.0 0.3 - 1.2 mg/dL   GFR calc non Af Amer >60 >60 mL/min   GFR calc Af Amer >60 >60 mL/min    Comment: (NOTE) The eGFR has been calculated using the CKD EPI equation. This calculation has not been validated in all clinical situations. eGFR's persistently <60 mL/min signify possible Chronic Kidney Disease.    Anion gap 13 5 - 15    Comment: Performed at [facility], [place] [address], [place], [place] [address]  I-Stat CG4 Lactic Acid, ED     Status: None   Collection Time: 06/25/17  6:08 PM  Result Value Ref Range   Lactic Acid, Venous 1.14 0.5 - 1.9 mmol/L   Ct Head Wo Contrast  Result Date: 06/25/2017 CLINICAL DATA:  Decreasing level of consciousness.  History of GBM. EXAM: CT HEAD WITHOUT CONTRAST TECHNIQUE: Contiguous axial images were obtained from the base of the skull through the vertex without intravenous contrast. COMPARISON:  Multiple priors.  Most recent MR 06/01/2017. Most recent CT 06/10/2017. 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 Communicating hydrocephalus, with enlargement of the lateral, third, and fourth ventricles appears similar, with transependymal absorption. Moderate surrounding vasogenic edema extends into the temporal, parietal, and occipital lobes. 5 mm of LEFT-to-RIGHT shift compares favorably with most recent CT. No confound in hemorrhage. No extra-axial fluid. Hydrocephalus not significantly worse. Vascular: No hyperdense vessel or unexpected calcification. Skull: No fracture or focal lesion. Sinuses/Orbits: No sinus fluid.  Negative orbits. Other: None. IMPRESSION: Recurrent glioblastoma multiforme not significantly progressed from prior CT or MR. No intervening hemorrhage. LEFT-to-RIGHT shift essentially stable. No worsening hydrocephalus. Electronically Signed   By: Staci Righter M.D.   On: 06/25/2017 20:42   Ct Angio Chest Pe W And/or Wo Contrast  Result Date: 06/25/2017 CLINICAL DATA:  Evaluate for pulmonary embolus. EXAM: CT ANGIOGRAPHY CHEST WITH CONTRAST TECHNIQUE: Multidetector CT imaging of the chest was performed using the standard protocol during bolus administration of intravenous contrast. Multiplanar CT image reconstructions and MIPs were obtained to evaluate the vascular anatomy. CONTRAST:  69m ISOVUE-370 IOPAMIDOL (ISOVUE-370) INJECTION 76% COMPARISON:  06/10/2017 FINDINGS: Cardiovascular: Mild cardiac enlargement. No pericardial effusion. The main pulmonary artery is patent. No central obstructing embolus. No lobar or segmental pulmonary artery filling defects identified to suggest acute pulmonary embolus. Mediastinum/Nodes: Normal appearance of the thyroid gland. The trachea appears patent and is midline. Normal appearance of the esophagus. No enlarged mediastinal or hilar lymph nodes. Lungs/Pleura: The left mainstem  bronchus from the level of the carina and the left lower lobe bronchi are completely occluded with debris. There is dense airspace consolidation involving nearly the entire left lower lobe. No discrete mass is identified. There is also opacification scratch set there also filling defects identified within the left upper lobe airway without significant left upper lobe atelectasis or consolidation. Patchy areas of ground-glass attenuation noted scattered within the right lung. Upper Abdomen: No acute abnormality. Musculoskeletal: No chest wall abnormality. No acute or significant osseous findings. Review of the MIP images confirms the above findings. IMPRESSION: 1. No evidence for acute pulmonary embolus. 2. Dense airspace consolidation involving much of the entire left lower lobe is identified. Findings are compatible with aspiration and/or pneumonia. 3. Occlusion of the left mainstem bronchus and its branches are noted extending into the left upper lobe and left lower lobe which may represent aspiration. Follow-up imaging is advised two ensure complete resolution and to rule out underlying neoplastic process. Electronically Signed   By: TKerby MoorsM.D.   On: 06/25/2017 20:41   Dg Chest Port 1 View  Result Date: 06/25/2017 CLINICAL DATA:  Shortness of breath and hypoxia. EXAM: PORTABLE CHEST 1 VIEW COMPARISON:  06/21/17. FINDINGS: The heart size and mediastinal contours are within normal limits. No pleural effusion. Left base opacity is new compared with previous exam. Mild subsegmental atelectasis noted in the lung bases. IMPRESSION: 1. Left base opacity suspicious for pneumonia. 2. Bibasilar atelectasis. Electronically Signed   By: TKerby MoorsM.D.   On: 06/25/2017 17:52    Pending Labs Unresulted Labs (From admission, onward)   Start     Ordered   06/25/17 1712  Blood culture (routine x 2)  BLOOD CULTURE X 2,   STAT     06/25/17 1711      Vitals/Pain Today's Vitals   06/25/17 1830 06/25/17  1900 06/25/17 1930 06/25/17 2102  BP: 98/69 95/61 100/65 109/74  Pulse: (!) 109 (!) 105 (!) 108 (!) 103  Resp: (!) 21 (!) 21 (!) 22 (!) 21  Temp:      TempSrc:      SpO2: 96% 97% 95% 94%  PainSc:        Isolation Precautions No active isolations  Medications Medications  vancomycin (VANCOCIN) IVPB 1000 mg/200 mL premix (1,000 mg Intravenous New Bag/Given 06/25/17 2059)  iopamidol (ISOVUE-370) 76 % injection (has no administration in time range)  sodium chloride 0.9 % bolus 1,000 mL (0 mLs Intravenous Stopped 06/25/17 1841)  dexamethasone (DECADRON) injection 10 mg (10 mg Intravenous Given 06/25/17 1810)  ceFEPIme (MAXIPIME) 2 g in sodium chloride 0.9 % 100 mL IVPB (0 g Intravenous Stopped 06/25/17 1935)  iopamidol (ISOVUE-370) 76 % injection 100 mL (80 mLs Intravenous Contrast Given 06/25/17 2000)    Mobility non-ambulatory

## 2017-06-25 NOTE — ED Provider Notes (Signed)
[facility] DEPT Provider Note   CSN: [id number] Arrival date & time: 06/25/17  1635     History   Chief Complaint Chief Complaint  Patient presents with  . Respiratory Distress  . Fatigue    HPI Addie A Bloodworth is a 52 y.o. female history of diabetes, glioblastoma multiforme, here presenting with altered mental status, hypoxia.  Patient was recently admitted to the hospital for altered mental status secondary to her glioblastoma.  She also was diagnosed with pneumonia and finished a course of antibiotics.  She is currently at a nursing facility.  Her mother visits her every day and noticed that she appears very altered this morning.  She also had some vomiting yesterday and had some respiratory distress today.  Facility noticed that she was hypoxic to 80% and tachycardic.  Patient is altered and unable to give any history. Patient is DNR and mother wants to focus on comfort care.   The history is provided by a relative.   Level V caveat- AMS   Past Medical History:  Diagnosis Date  . Candida, oral 06/22/2016   Due to steroids 06/22/16  . Diabetes mellitus   . DUB (dysfunctional uterine bleeding)    total abdominal hysterectomy in 2007, ovaries intact  . Furuncle of multiple sites    multiple I and D's  . GERD (gastroesophageal reflux disease)   . Hydradenitis   . Intellectual disability   . 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 Pressure injury of skin 06/15/2017  . DNR (do not resuscitate)   . Altered mental status   . Acute metabolic encephalopathy 37/10/6267  . Brain mass 06/10/2017  . Seizures ([place]) 06/10/2017  . Healthcare maintenance 02/26/2017  . Leg pain, anterior, left 01/18/2017  . Protein-calorie malnutrition, severe 08/21/2016  . Symptomatic anemia 08/20/2016  . Dehydration 08/20/2016  . Acute prerenal azotemia 08/20/2016  . SIRS (systemic inflammatory  response syndrome) ([place]) 08/20/2016  . Glioblastoma multiforme of temporal lobe ([place]) 07/11/2016  . Furuncle of labia majora 06/29/2016  . 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 Palliative care by specialist   . HLD (hyperlipidemia) 06/18/2016  . GERD (gastroesophageal reflux disease) 06/18/2016  . Brain tumor ([place])   . Nonproliferative diabetic retinopathy ([place]) 12/17/2015  . Onychomycosis of left great toe 01/13/2014  . DNR (do not resuscitate) discussion 09/23/2013  . Essential hypertension 06/11/2013  . Diabetes mellitus type 2 with retinopathy ([place]) 11/30/2009  . Obesity 09/28/2009  . SICKLE-CELL TRAIT 09/28/2009    Past Surgical History:  Procedure Laterality Date  . ABDOMINAL HYSTERECTOMY    . HYDRADENITIS EXCISION    . TONSILLECTOMY AND ADENOIDECTOMY     in early childhood     OB History   None      Home Medications    Prior to Admission medications   Medication Sig Start Date End Date Taking? Authorizing Provider  acetaminophen (TYLENOL) 500 MG tablet Take 1 tablet (500 mg total) by mouth every 6 (six) hours as needed for moderate pain. 02/26/17  Yes Hoffman, Jessica Ratliff, DO  dexamethasone (DECADRON) 4 MG tablet Take 1 tablet (4 mg total) by mouth every 6 (six) hours as needed. 06/22/17  Yes Velvet Bathe, MD  famotidine (PEPCID) 20 MG tablet Take 1 tablet (20 mg total) by mouth 2 (two) times daily. 07/03/16  Yes Minus Liberty, MD  fluticasone [facility]) 50 MCG/ACT nasal spray Place 2 sprays into both nostrils  daily. 06/08/16  Yes Barnet Glasgow, NP  insulin aspart (NOVOLOG FLEXPEN) 100 UNIT/ML FlexPen Inject 3-5 Units into the skin 3 (three) times daily with meals. Patient taking differently: Inject 2-12 Units into the skin 3 (three) times daily with meals. 201-250=2 units, 251-300=4 units, 301-350=6 units, 351-400=8 units, 401-450=10 units, >450=12 units and Repeat CBG in 2 hours. If still above 450 CALL MD. 04/09/17 10/06/17 Yes Katherine Roan, MD  insulin glargine (LANTUS) 100 unit/mL SOPN Inject 0.17 mLs (17 Units total) into the skin at bedtime. 04/09/17  Yes Katherine Roan, MD  lacosamide 100 MG TABS Take 1 tablet (100 mg total) by mouth 2 (two) times daily. 06/22/17  Yes Velvet Bathe, MD  levETIRAcetam (KEPPRA) 500 MG tablet Take 3 tablets (1,500 mg total) by mouth 2 (two) times daily. Patient taking differently: Take 1,500 mg by mouth 3 (three) times daily.  06/05/17  Yes Vaslow, Acey Lav, MD  lisinopril (PRINIVIL,ZESTRIL) 40 MG tablet Take 40 mg by mouth daily.   Yes [provider]  loratadine (CLARITIN) 10 MG tablet Take 1 tablet (10 mg total) by mouth daily. 07/03/16  Yes Minus Liberty, MD  metFORMIN (GLUCOPHAGE) 1000 MG tablet Take 1 tablet (1,000 mg total) by mouth 2 (two) times daily with a meal. 02/20/17 11/17/17 Yes Winfrey, Jenne Pane, MD  montelukast (SINGULAIR) 10 MG tablet Take 1 tablet (10 mg total) by mouth at bedtime. 01/10/17  Yes Ladell Pier, MD  Nutritional Supplements (FEEDING SUPPLEMENT, BOOST BREEZE,) LIQD Take 1 Bottle by mouth 3 (three) times daily.  08/17/16  Yes [provider]  ondansetron (ZOFRAN ODT) 4 MG disintegrating tablet Take 1 tablet (4 mg total) by mouth every 8 (eight) hours as needed for nausea or vomiting. 07/24/16  Yes Ladell Pier, MD  potassium chloride SA (K-DUR,KLOR-CON) 20 MEQ tablet TAKE 1 TABLET BY MOUTH DAILY 05/10/17  Yes Ladell Pier, MD  ACCU-CHEK SOFTCLIX LANCETS lancets USE TO CHECK BLOOD SUGAR FOUR TIMES DAILY 07/11/16   Minus Liberty, MD  blood glucose meter kit and supplies Dispense based on patient and insurance preference. Use up to four times daily as directed. (FOR ICD-9 250.00, 250.01). 06/21/16   Holley Raring, MD  glucose blood (ACCU-CHEK AVIVA PLUS) test strip USE TO TEST BLOOD SUGAR FOUR TIMES DAILY Diagnosis code:E11.319 02/13/17   Katherine Roan, MD  Insulin Pen Needle 31G X 5 MM MISC Use for injections under the skin as  directed. Use 4 times daily. diag code E11.319 insulin dependent 02/13/17   Katherine Roan, MD    Family History Family History  Problem Relation Age of Onset  . Cancer Mother 20       Breast cancer  . Hypertension Mother   . Depression Mother   . Sickle cell trait Mother   . Cancer Father 69       lung cancer, was a long term smoker    Social History Social History   Tobacco Use  . Smoking status: Never Smoker  . Smokeless tobacco: Never Used  Substance Use Topics  . Alcohol use: No    Alcohol/week: 0.0 oz  . Drug use: No     Allergies   Patient has no known allergies.   Review of Systems Review of Systems  Unable to perform ROS: Mental status change  All other systems reviewed and are negative.    Physical Exam Updated Vital Signs BP 100/65   Pulse (!) 108   Temp 99.3 F (37.4 C) (  Rectal)   Resp (!) 22   LMP 10/31/2005   SpO2 95%   Physical Exam  Constitutional:  Tired, difficult to arouse   HENT:  Head: Normocephalic.  MM dry   Eyes: Pupils are equal, round, and reactive to light. EOM are normal.  Neck: Normal range of motion. Neck supple.  Cardiovascular: Regular rhythm.  Tachycardic   Pulmonary/Chest:  Crackles L base, tachypneic   Abdominal: Soft. Bowel sounds are normal. She exhibits no distension. There is no tenderness. There is no guarding.  Musculoskeletal: Normal range of motion.  Neurological:  Tired, difficult to arouse, moving all extremities   Skin: Skin is warm.  Psychiatric: She has a normal mood and affect.  Nursing note and vitals reviewed.    ED Treatments / Results  Labs (all labs ordered are listed, but only abnormal results are displayed) Labs Reviewed  CBC WITH DIFFERENTIAL/PLATELET - Abnormal; Notable for the following components:      Result Value   WBC 19.2 (*)    Neutro Abs 18.2 (*)    Lymphs Abs 0.6 (*)    All other components within normal limits  COMPREHENSIVE METABOLIC PANEL - Abnormal; Notable for  the following components:   Glucose, Bld 205 (*)    BUN 22 (*)    Total Protein 6.4 (*)    Albumin 2.9 (*)    ALT 120 (*)    Alkaline Phosphatase 177 (*)    All other components within normal limits  CULTURE, BLOOD (ROUTINE X 2)  CULTURE, BLOOD (ROUTINE X 2)  I-STAT CG4 LACTIC ACID, ED  I-STAT TROPONIN, ED  I-STAT CHEM 8, ED    EKG EKG Interpretation  Date/Time:  Monday Jun 25 2017 17:31:04 EDT Ventricular Rate:  114 PR Interval:    QRS Duration: 81 QT Interval:  335 QTC Calculation: 462 R Axis:   -88 Text Interpretation:  Sinus tachycardia Left anterior fascicular block Abnormal R-wave progression, late transition ST elevation, consider inferior injury No significant change since last tracing Confirmed by Wandra Arthurs [phone]) on 06/25/2017 5:38:37 PM   Radiology Ct Head Wo Contrast  Result Date: 06/25/2017 CLINICAL DATA:  Decreasing level of consciousness.  History of GBM. EXAM: CT HEAD WITHOUT CONTRAST TECHNIQUE: Contiguous axial images were obtained from the base of the skull through the vertex without intravenous contrast. COMPARISON:  Multiple priors. Most recent MR 06/01/2017. Most recent CT 06/10/2017. 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 Communicating hydrocephalus, with enlargement of the lateral, third, and fourth ventricles appears similar, with transependymal absorption. Moderate surrounding vasogenic edema extends into the temporal, parietal, and occipital lobes. 5 mm of LEFT-to-RIGHT shift compares favorably with most recent CT. No confound in hemorrhage. No extra-axial fluid. Hydrocephalus not significantly worse. Vascular: No hyperdense vessel or unexpected calcification. Skull: No fracture or focal lesion. Sinuses/Orbits: No sinus fluid.  Negative orbits. Other: None. IMPRESSION: Recurrent glioblastoma multiforme not significantly progressed from prior CT or MR. No  intervening hemorrhage. LEFT-to-RIGHT shift essentially stable. No worsening hydrocephalus. Electronically Signed   By: Staci Righter M.D.   On: 06/25/2017 20:42   Ct Angio Chest Pe W And/or Wo Contrast  Result Date: 06/25/2017 CLINICAL DATA:  Evaluate for pulmonary embolus. EXAM: CT ANGIOGRAPHY CHEST WITH CONTRAST TECHNIQUE: Multidetector CT imaging of the chest was performed using the standard protocol during bolus administration of intravenous contrast. Multiplanar CT image reconstructions and MIPs were obtained to evaluate the vascular anatomy. CONTRAST:  9m ISOVUE-370 IOPAMIDOL (ISOVUE-370) INJECTION 76% COMPARISON:  06/10/2017 FINDINGS: Cardiovascular: Mild cardiac enlargement. No pericardial effusion. The main pulmonary artery is patent. No central obstructing embolus. No lobar or segmental pulmonary artery filling defects identified to suggest acute pulmonary embolus. Mediastinum/Nodes: Normal appearance of the thyroid gland. The trachea appears patent and is midline. Normal appearance of the esophagus. No enlarged mediastinal or hilar lymph nodes. Lungs/Pleura: The left mainstem bronchus from the level of the carina and the left lower lobe bronchi are completely occluded with debris. There is dense airspace consolidation involving nearly the entire left lower lobe. No discrete mass is identified. There is also opacification scratch set there also filling defects identified within the left upper lobe airway without significant left upper lobe atelectasis or consolidation. Patchy areas of ground-glass attenuation noted scattered within the right lung. Upper Abdomen: No acute abnormality. Musculoskeletal: No chest wall abnormality. No acute or significant osseous findings. Review of the MIP images confirms the above findings. IMPRESSION: 1. No evidence for acute pulmonary embolus. 2. Dense airspace consolidation involving much of the entire left lower lobe is identified. Findings are compatible with  aspiration and/or pneumonia. 3. Occlusion of the left mainstem bronchus and its branches are noted extending into the left upper lobe and left lower lobe which may represent aspiration. Follow-up imaging is advised two ensure complete resolution and to rule out underlying neoplastic process. Electronically Signed   By: TKerby MoorsM.D.   On: 06/25/2017 20:41   Dg Chest Port 1 View  Result Date: 06/25/2017 CLINICAL DATA:  Shortness of breath and hypoxia. EXAM: PORTABLE CHEST 1 VIEW COMPARISON:  06/21/17. FINDINGS: The heart size and mediastinal contours are within normal limits. No pleural effusion. Left base opacity is new compared with previous exam. Mild subsegmental atelectasis noted in the lung bases. IMPRESSION: 1. Left base opacity suspicious for pneumonia. 2. Bibasilar atelectasis. Electronically Signed   By: TKerby MoorsM.D.   On: 06/25/2017 17:52    Procedures Procedures (including critical care time)  CRITICAL CARE Performed by: DWandra Arthurs  Total critical care time: 30 minutes  Critical care time was exclusive of separately billable procedures and treating other patients.  Critical care was necessary to treat or prevent imminent or life-threatening deterioration.  Critical care was time spent personally by me on the following activities: development of treatment plan with patient and/or surrogate as well as nursing, discussions with consultants, evaluation of patient's response to treatment, examination of patient, obtaining history from patient or surrogate, ordering and performing treatments and interventions, ordering and review of laboratory studies, ordering and review of radiographic studies, pulse oximetry and re-evaluation of patient's condition.   Medications Ordered in ED Medications  vancomycin (VANCOCIN) IVPB 1000 mg/200 mL premix (1,000 mg Intravenous New Bag/Given 06/25/17 2059)  iopamidol (ISOVUE-370) 76 % injection (has no administration in time range)    sodium chloride 0.9 % bolus 1,000 mL (0 mLs Intravenous Stopped 06/25/17 1841)  dexamethasone (DECADRON) injection 10 mg (10 mg Intravenous Given 06/25/17 1810)  ceFEPIme (MAXIPIME) 2 g in sodium chloride 0.9 % 100 mL IVPB (0 g Intravenous Stopped 06/25/17 1935)  iopamidol (ISOVUE-370) 76 % injection 100 mL (80 mLs Intravenous Contrast Given 06/25/17 2000)     Initial Impression / Assessment and Plan / ED Course  I have reviewed the triage vital signs and the nursing notes.  Pertinent labs & imaging results that were available during my care of the patient were reviewed by me and considered in my medical  decision making (see chart for details).    DAMARYS SPEIR is a 52 y.o. female here with SOB, AMS. Concerned for enlarging glioblastoma vs PE vs pneumonia. Will get labs, lactate, cultures, CT head, CT angio chest.   9:01 PM WBC 19. CXR showed pneumonia. CTA showed pneumonia as well. CT head showed stable glioblastoma. Given vanc/cefepime for HCAP. Patient is DNR. Still tachypneic and altered. Will admit.   Final Clinical Impressions(s) / ED Diagnoses   Final diagnoses:  None    ED Discharge Orders    None       Drenda Freeze, MD 06/25/17 2104

## 2017-06-26 ENCOUNTER — Other Ambulatory Visit: Payer: Self-pay

## 2017-06-26 ENCOUNTER — Other Ambulatory Visit (HOSPITAL_COMMUNITY): Payer: Medicaid Other

## 2017-06-26 DIAGNOSIS — J9601 Acute respiratory failure with hypoxia: Secondary | ICD-10-CM

## 2017-06-26 LAB — CBC
HEMATOCRIT: 39.1 % (ref 36.0–46.0)
HEMOGLOBIN: 12.8 g/dL (ref 12.0–15.0)
MCH: 29.6 pg (ref 26.0–34.0)
MCHC: 32.7 g/dL (ref 30.0–36.0)
MCV: 90.3 fL (ref 78.0–100.0)
Platelets: 246 10*3/uL (ref 150–400)
RBC: 4.33 MIL/uL (ref 3.87–5.11)
RDW: 14.4 % (ref 11.5–15.5)
WBC: 19.1 10*3/uL — ABNORMAL HIGH (ref 4.0–10.5)

## 2017-06-26 LAB — GLUCOSE, CAPILLARY
GLUCOSE-CAPILLARY: 165 mg/dL — AB (ref 65–99)
GLUCOSE-CAPILLARY: 139 mg/dL — AB (ref 65–99)
Glucose-Capillary: 109 mg/dL — ABNORMAL HIGH (ref 65–99)
Glucose-Capillary: 149 mg/dL — ABNORMAL HIGH (ref 65–99)
Glucose-Capillary: 153 mg/dL — ABNORMAL HIGH (ref 65–99)
Glucose-Capillary: 89 mg/dL (ref 65–99)

## 2017-06-26 LAB — BASIC METABOLIC PANEL
Anion gap: 13 (ref 5–15)
BUN: 19 mg/dL (ref 6–20)
CALCIUM: 9.2 mg/dL (ref 8.9–10.3)
CO2: 24 mmol/L (ref 22–32)
Chloride: 108 mmol/L (ref 101–111)
Creatinine, Ser: 0.42 mg/dL — ABNORMAL LOW (ref 0.44–1.00)
GFR calc Af Amer: >60 mL/min (ref >60)
GFR calc non Af Amer: >60 mL/min (ref >60)
GLUCOSE: 181 mg/dL — AB (ref 65–99)
POTASSIUM: 3.5 mmol/L (ref 3.5–5.1)
Sodium: 145 mmol/L (ref 135–145)

## 2017-06-26 LAB — MRSA PCR SCREENING: MRSA by PCR: NEGATIVE

## 2017-06-26 LAB — HIV ANTIBODY (ROUTINE TESTING W REFLEX): HIV SCREEN 4TH GENERATION: NONREACTIVE

## 2017-06-26 LAB — HEPATIC FUNCTION PANEL
ALBUMIN: 3 g/dL — AB (ref 3.5–5.0)
ALK PHOS: 157 U/L — AB (ref 38–126)
ALT: 100 U/L — ABNORMAL HIGH (ref 14–54)
AST: 25 U/L (ref 15–41)
BILIRUBIN DIRECT: 0.2 mg/dL (ref 0.1–0.5)
BILIRUBIN INDIRECT: 0.6 mg/dL (ref 0.3–0.9)
BILIRUBIN TOTAL: 0.8 mg/dL (ref 0.3–1.2)
Total Protein: 6.4 g/dL — ABNORMAL LOW (ref 6.5–8.1)

## 2017-06-26 LAB — CBG MONITORING, ED: Glucose-Capillary: 193 mg/dL — ABNORMAL HIGH (ref 65–99)

## 2017-06-26 MED ORDER — KCL IN DEXTROSE-NACL 20-5-0.45 MEQ/L-%-% IV SOLN
INTRAVENOUS | Status: DC
  Administered 2017-06-26: 17:00:00 via INTRAVENOUS
  Filled 2017-06-26 (×3): qty 1000

## 2017-06-26 MED ORDER — LEVETIRACETAM IN NACL 1500 MG/100ML IV SOLN
1500.0000 mg | Freq: Two times a day (BID) | INTRAVENOUS | Status: DC
  Administered 2017-06-26 – 2017-06-28 (×5): 1500 mg via INTRAVENOUS
  Filled 2017-06-26 (×6): qty 100

## 2017-06-26 MED ORDER — PIPERACILLIN-TAZOBACTAM 3.375 G IVPB
3.3750 g | Freq: Three times a day (TID) | INTRAVENOUS | Status: DC
  Administered 2017-06-26 – 2017-06-27 (×5): 3.375 g via INTRAVENOUS
  Filled 2017-06-26 (×6): qty 50

## 2017-06-26 MED ORDER — VANCOMYCIN HCL 10 G IV SOLR
1250.0000 mg | INTRAVENOUS | Status: DC
  Filled 2017-06-26: qty 1250

## 2017-06-26 NOTE — ED Notes (Addendum)
Attempted to call report to floor. No "purple man" had been documented after pt bed set as ready for approx. 20 minutes

## 2017-06-26 NOTE — Progress Notes (Signed)
Pt had vomited a small amount of greenish black vomitus. It was found dried on her face back to her left ear. She was cleaned up. No verba stimili

## 2017-06-26 NOTE — Clinical Social Work Note (Signed)
Clinical Social Work Assessment  Patient Details  Name: Jaime [name] MRN: [id number] Date of Birth: Aug 28, 1965  Date of referral:  06/26/17               Reason for consult:  (admitted from facility)                Permission sought to share information with:  Family Supports Permission granted to share information::     Name::     mother/HCPOA Kendrick Fries  Agency::  [facility]  Relationship::     Contact Information:     Housing/Transportation Living arrangements for the past 2 months:  [place], Single Family Home(moved into SNF as long term care resident 06/23/17) Source of Information:  Medical Team Patient Interpreter Needed:  None Criminal Activity/Legal Involvement Pertinent to Current Situation/Hospitalization:  No - Comment as needed Significant Relationships:  Parents, Other Family Members Lives with:  Facility Resident Do you feel safe going back to the place where you live?  Yes Need for family participation in patient care:  Yes (Comment)(mother is HCPOA- pt with confusion and I/DD)  Care giving concerns:  Pt admitted from [facility]- moved in there 06/23/17 when she was discharged from [facility]. Has I/DD and glioblastoma- associated confusion. Prior to that was living at home with her mother who was her caretaker. Mother also her healthcare power of attorney. Readmitted for SOB, being treated for aspiration pneumonia.  Social Worker assessment / plan:  CSW consulted to assist with disposition as pt admitted from facility- [facility]. Pt and mother known to CSW from recent admission, facilitated transition to [facility] as a new long term care resident at [facility] on 06/23/17. Notified facility of pt's status and will follow to assist with disposition during this hospitalization as well.  Employment status:  Disabled (Comment on whether or not currently receiving Disability)(recieves disability) Insurance information:  Medicaid [place] [facility]  Recommendations:  Not assessed at this time(SNF recommended during recent hospitalization) Information / Referral to community resources:     Patient/Family's Response to care:  UTA  Patient/Family's Understanding of and Emotional Response to Diagnosis, Current Treatment, and Prognosis:  *UTA  Emotional Assessment Appearance:  Appears stated age Attitude/Demeanor/Rapport:  Unable to Assess Affect (typically observed):  Calm Orientation:  Oriented to Self Alcohol / Substance use:  Not Applicable Psych involvement (Current and /or in the community):  No (Comment)  Discharge Needs  Concerns to be addressed:  Care Coordination Readmission within the last 30 days:  Yes Current discharge risk:  (assessing) Barriers to Discharge:  Continued Medical Work up   [employer], LCSW 06/26/2017, 11:38 AM  [phone]

## 2017-06-26 NOTE — Progress Notes (Signed)
PROGRESS NOTE    Jaime [name]  ZOX:[id number] DOB: 01/29/1966 DOA: 06/25/2017 PCP: Katherine Roan, MD   Brief Narrative:   52 y.o. female with history of glioblastoma multiforme, seizures and diabetes mellitus who was recently admitted for encephalopathy at the time patient's seizure medication was increased and Vimpat was added and discharged to skilled nursing facility was brought to the ER after patient was found to be getting more hypoxic and altered mental status  Assessment & Plan:   Principal Problem:   Acute respiratory failure with hypoxia ([place]) - 2ary to new onset pneumonia, suspecting aspiration pna. - continue supplemental oxygen - continue IV antibiotics  Aspiration pna - Most likely secondary to worsening carcinoma - Pt is on Zosyn  Active Problems:   Diabetes mellitus type 2 with retinopathy ([place]) - continue SSI    DNR (do not resuscitate) discussion   Glioblastoma multiforme of temporal lobe ([place]) - suspect this is worsening    Seizures ([place]) - Vimpat and Keppra. Stable    Acute metabolic encephalopathy - most likely due infections etiology   DVT prophylaxis: Lovenox Code Status: DNR Family Communication: d/c with mother at bedside Disposition Plan:  Pending improvement in condition, discussed disposition with mother and current state. Recommended that comfort care be considered. She said if her condition did not improve despite treatment she would consider transitioning.   Consultants:   Medical oncology   Procedures: none   Antimicrobials: Zosyn   Subjective:   Objective: Vitals:   06/26/17 0030 06/26/17 0222 06/26/17 0600 06/26/17 1320  BP: 109/68 96/71 103/65 105/65  Pulse: (!) 101 94 92 60  Resp: 20 16 (!) 24 20  Temp:  98.9 F (37.2 C) 98 F (36.7 C) 98.7 F (37.1 C)  TempSrc:  Axillary Axillary Axillary  SpO2: 99% 96% 100% 100%    Intake/Output Summary (Last 24 hours) at 06/26/2017 1529 Last data filed at 06/25/2017  2200 Gross per 24 hour  Intake 1299.75 ml  Output -  Net 1299.75 ml   There were no vitals filed for this visit.  Examination:  General exam: Pt in nad, somnolent Respiratory system: rhales over left lung base, no wheezes, equal chest rise.  Cardiovascular system: S1 & S2 heard, RRR. No JVD, murmurs, rubs Gastrointestinal system: Abdomen is nondistended, soft and nontender.  Central nervous system: somnolent but arrousable  Extremities: Symmetric 5 x 5 power. Skin: No rashes, lesions or ulcers, on limited exam. Psychiatry: unable to assess due to somnolence  Data Reviewed: I have personally reviewed following labs and imaging studies  CBC: Recent Labs  Lab 06/25/17 1807 06/26/17 0428  WBC 19.2* 19.1*  NEUTROABS 18.2*  --   HGB 13.5 12.8  HCT 40.6 39.1  MCV 89.0 90.3  PLT 264 811   Basic Metabolic Panel: Recent Labs  Lab 06/25/17 1807 06/26/17 0428  NA 142 145  K 3.5 3.5  CL 104 108  CO2 25 24  GLUCOSE 205* 181*  BUN 22* 19  CREATININE 0.55 0.42*  CALCIUM 9.4 9.2   GFR: Estimated Creatinine Clearance: 73.7 mL/min (A) (by C-G formula based on SCr of 0.42 mg/dL (L)). Liver Function Tests: Recent Labs  Lab 06/25/17 1807 06/26/17 0428  AST 31 25  ALT 120* 100*  ALKPHOS 177* 157*  BILITOT 1.0 0.8  PROT 6.4* 6.4*  ALBUMIN 2.9* 3.0*   No results for input(s): LIPASE, AMYLASE in the last 168 hours. No results for input(s): AMMONIA in the last 168 hours. Coagulation Profile: No  results for input(s): INR, PROTIME in the last 168 hours. Cardiac Enzymes: No results for input(s): CKTOTAL, CKMB, CKMBINDEX, TROPONINI in the last 168 hours. BNP (last 3 results) No results for input(s): PROBNP in the last 8760 hours. HbA1C: No results for input(s): HGBA1C in the last 72 hours. CBG: Recent Labs  Lab 06/23/17 1130 06/26/17 0109 06/26/17 0357 06/26/17 0722 06/26/17 1144  GLUCAP 192* 193* 165* 153* 139*   Lipid Profile: No results for input(s): CHOL, HDL,  LDLCALC, TRIG, CHOLHDL, LDLDIRECT in the last 72 hours. Thyroid Function Tests: No results for input(s): TSH, T4TOTAL, FREET4, T3FREE, THYROIDAB in the last 72 hours. Anemia Panel: No results for input(s): VITAMINB12, FOLATE, FERRITIN, TIBC, IRON, RETICCTPCT in the last 72 hours. Sepsis Labs: Recent Labs  Lab 06/25/17 1808  LATICACIDVEN 1.14    Recent Results (from the past 240 hour(s))  Blood culture (routine x 2)     Status: None (Preliminary result)   Collection Time: 06/25/17  6:04 PM  Result Value Ref Range Status   Specimen Description   Final    BLOOD LEFT HAND Performed at [facility] [address], [place], [place] [address]    Special Requests   Final    BOTTLES DRAWN AEROBIC AND ANAEROBIC Blood Culture adequate volume Performed at [facility] [address] [place], [place] [address]    Culture   Final    NO GROWTH < 24 HOURS Performed at [facility] [address], [name], [place] [address]    Report Status PENDING  Incomplete  Blood culture (routine x 2)     Status: None (Preliminary result)   Collection Time: 06/25/17  6:43 PM  Result Value Ref Range Status   Specimen Description   Final    BLOOD RIGHT WRIST Performed at [facility] [address], [place], [place] [address]    Special Requests   Final    BOTTLES DRAWN AEROBIC AND ANAEROBIC Blood Culture adequate volume Performed at [facility] [address] [place], [place] [address]    Culture   Final    NO GROWTH < 24 HOURS Performed at [facility] [address] [place], [place] [address]    Report Status PENDING  Incomplete  MRSA PCR Screening     Status: None   Collection Time: 06/26/17  4:35 AM  Result Value Ref Range Status   MRSA by PCR NEGATIVE NEGATIVE Final    Comment:        The GeneXpert MRSA Assay (FDA approved for NASAL specimens only), is one component of a comprehensive MRSA  colonization surveillance program. It is not intended to diagnose MRSA infection nor to guide or monitor treatment for MRSA infections. Performed at [facility], [place] [address], [place], [place] [address]      Radiology Studies: Ct Head Wo Contrast  Result Date: 06/25/2017 CLINICAL DATA:  Decreasing level of consciousness.  History of GBM. EXAM: CT HEAD WITHOUT CONTRAST TECHNIQUE: Contiguous axial images were obtained from the base of the skull through the vertex without intravenous contrast. COMPARISON:  Multiple priors. Most recent MR 06/01/2017. Most recent CT 06/10/2017. 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 Communicating hydrocephalus, with enlargement of the lateral, third, and fourth ventricles appears similar, with transependymal absorption. Moderate surrounding vasogenic edema extends into the temporal, parietal, and occipital lobes. 5 mm of LEFT-to-RIGHT shift compares  favorably with most recent CT. No confound in hemorrhage. No extra-axial fluid. Hydrocephalus not significantly worse. Vascular: No hyperdense vessel or unexpected calcification. Skull: No fracture or focal lesion. Sinuses/Orbits: No sinus fluid.  Negative orbits. Other: None. IMPRESSION: Recurrent glioblastoma multiforme not significantly progressed from prior CT or MR. No intervening hemorrhage. LEFT-to-RIGHT shift essentially stable. No worsening hydrocephalus. Electronically Signed   By: Staci Righter M.D.   On: 06/25/2017 20:42   Ct Angio Chest Pe W And/or Wo Contrast  Result Date: 06/25/2017 CLINICAL DATA:  Evaluate for pulmonary embolus. EXAM: CT ANGIOGRAPHY CHEST WITH CONTRAST TECHNIQUE: Multidetector CT imaging of the chest was performed using the standard protocol during bolus administration of intravenous contrast. Multiplanar CT image reconstructions and MIPs were obtained to evaluate the vascular  anatomy. CONTRAST:  35mL ISOVUE-370 IOPAMIDOL (ISOVUE-370) INJECTION 76% COMPARISON:  06/10/2017 FINDINGS: Cardiovascular: Mild cardiac enlargement. No pericardial effusion. The main pulmonary artery is patent. No central obstructing embolus. No lobar or segmental pulmonary artery filling defects identified to suggest acute pulmonary embolus. Mediastinum/Nodes: Normal appearance of the thyroid gland. The trachea appears patent and is midline. Normal appearance of the esophagus. No enlarged mediastinal or hilar lymph nodes. Lungs/Pleura: The left mainstem bronchus from the level of the carina and the left lower lobe bronchi are completely occluded with debris. There is dense airspace consolidation involving nearly the entire left lower lobe. No discrete mass is identified. There is also opacification scratch set there also filling defects identified within the left upper lobe airway without significant left upper lobe atelectasis or consolidation. Patchy areas of ground-glass attenuation noted scattered within the right lung. Upper Abdomen: No acute abnormality. Musculoskeletal: No chest wall abnormality. No acute or significant osseous findings. Review of the MIP images confirms the above findings. IMPRESSION: 1. No evidence for acute pulmonary embolus. 2. Dense airspace consolidation involving much of the entire left lower lobe is identified. Findings are compatible with aspiration and/or pneumonia. 3. Occlusion of the left mainstem bronchus and its branches are noted extending into the left upper lobe and left lower lobe which may represent aspiration. Follow-up imaging is advised two ensure complete resolution and to rule out underlying neoplastic process. Electronically Signed   By: Kerby Moors M.D.   On: 06/25/2017 20:41   Dg Chest Port 1 View  Result Date: 06/25/2017 CLINICAL DATA:  Shortness of breath and hypoxia. EXAM: PORTABLE CHEST 1 VIEW COMPARISON:  06/21/17. FINDINGS: The heart size and  mediastinal contours are within normal limits. No pleural effusion. Left base opacity is new compared with previous exam. Mild subsegmental atelectasis noted in the lung bases. IMPRESSION: 1. Left base opacity suspicious for pneumonia. 2. Bibasilar atelectasis. Electronically Signed   By: Kerby Moors M.D.   On: 06/25/2017 17:52   Scheduled Meds: . dexamethasone  4 mg Intravenous Q6H  . enoxaparin (LOVENOX) injection  40 mg Subcutaneous Daily  . insulin aspart  0-9 Units Subcutaneous Q4H   Continuous Infusions: . sodium chloride 100 mL/hr at 06/26/17 0006  . lacosamide (VIMPAT) IV Stopped (06/26/17 1023)  . levETIRAcetam Stopped (06/26/17 0908)  . piperacillin-tazobactam (ZOSYN)  IV 3.375 g (06/26/17 1508)     LOS: 1 day    Time spent: 40 minutes  Velvet Bathe, MD [facility] Pager [phone]  If 7PM-7AM, please contact night-coverage [email or web address] Password [facility] 06/26/2017, 3:29 PM

## 2017-06-26 NOTE — Progress Notes (Signed)
Pharmacy Antibiotic Note  Jaime [name] is a 52 y.o. female admitted on 06/25/2017 with pneumonia.  Pharmacy has been consulted for zosyn and vancomycin dosing.  Plan: Zosyn 3.375g IV q8h (4 hour infusion).  Vancomycin 1 gm x1 then 1250 mg IV q24h for est AUC =510 Goal AUC = 400-500 Daily scr F/u cultures/levels     Temp (24hrs), Avg:98.6 F (37 C), Min:97.9 F (36.6 C), Max:99.3 F (37.4 C)  Recent Labs  Lab 06/19/17 0911 06/25/17 1807 06/25/17 1808  WBC 12.6* 19.2*  --   CREATININE 0.42* 0.55  --   LATICACIDVEN  --   --  1.14    Estimated Creatinine Clearance: 73.7 mL/min (by C-G formula based on SCr of 0.55 mg/dL).    No Known Allergies  Antimicrobials this admission: 5/27 zosyn >>  5/27 cefepime >> x1 ED 5/27 vancomycin >>   Dose adjustments this admission:   Microbiology results:  BCx:   UCx:    Sputum:    MRSA PCR:   Thank you for allowing pharmacy to be a part of this patient's care.  Dorrene German 06/26/2017 12:17 AM

## 2017-06-27 DIAGNOSIS — J189 Pneumonia, unspecified organism: Secondary | ICD-10-CM

## 2017-06-27 DIAGNOSIS — C712 Malignant neoplasm of temporal lobe: Principal | ICD-10-CM

## 2017-06-27 DIAGNOSIS — G9341 Metabolic encephalopathy: Secondary | ICD-10-CM

## 2017-06-27 DIAGNOSIS — Z515 Encounter for palliative care: Secondary | ICD-10-CM

## 2017-06-27 DIAGNOSIS — Z7189 Other specified counseling: Secondary | ICD-10-CM

## 2017-06-27 DIAGNOSIS — R569 Unspecified convulsions: Secondary | ICD-10-CM

## 2017-06-27 DIAGNOSIS — E11319 Type 2 diabetes mellitus with unspecified diabetic retinopathy without macular edema: Secondary | ICD-10-CM

## 2017-06-27 LAB — GLUCOSE, CAPILLARY
GLUCOSE-CAPILLARY: 180 mg/dL — AB (ref 65–99)
GLUCOSE-CAPILLARY: 183 mg/dL — AB (ref 65–99)
Glucose-Capillary: 150 mg/dL — ABNORMAL HIGH (ref 65–99)
Glucose-Capillary: 86 mg/dL (ref 65–99)

## 2017-06-27 MED ORDER — POLYVINYL ALCOHOL 1.4 % OP SOLN: 1.0000 [drp] | Freq: Four times a day (QID) | OPHTHALMIC | Status: DC | PRN

## 2017-06-27 MED ORDER — SODIUM CHLORIDE 0.9 % IV SOLN
12.5000 mg | Freq: Four times a day (QID) | INTRAVENOUS | Status: DC | PRN
  Administered 2017-06-27: 12.5 mg via INTRAVENOUS
  Filled 2017-06-27 (×2): qty 0.5

## 2017-06-27 MED ORDER — MORPHINE SULFATE (CONCENTRATE) 10 MG/0.5ML PO SOLN: 5.0000 mg | ORAL | Status: DC | PRN

## 2017-06-27 MED ORDER — LORAZEPAM 2 MG/ML IJ SOLN: 0.5000 mg | INTRAMUSCULAR | Status: DC | PRN

## 2017-06-27 MED ORDER — GLYCOPYRROLATE 0.2 MG/ML IJ SOLN: 0.2000 mg | INTRAMUSCULAR | Status: DC | PRN

## 2017-06-27 MED ORDER — KCL IN DEXTROSE-NACL 20-5-0.45 MEQ/L-%-% IV SOLN
INTRAVENOUS | Status: DC
  Administered 2017-06-27: 17:00:00 via INTRAVENOUS
  Filled 2017-06-27: qty 1000

## 2017-06-27 MED ORDER — GLYCOPYRROLATE 0.2 MG/ML IJ SOLN
0.2000 mg | INTRAMUSCULAR | Status: DC | PRN
Start: 2017-06-27 — End: 2017-06-28
  Administered 2017-06-27: 0.2 mg via INTRAVENOUS
  Filled 2017-06-27: qty 1

## 2017-06-27 MED ORDER — BIOTENE DRY MOUTH MT LIQD: 15.0000 mL | OROMUCOSAL | Status: DC | PRN

## 2017-06-27 MED ORDER — MORPHINE SULFATE (PF) 2 MG/ML IV SOLN: 1.0000 mg | INTRAVENOUS | Status: DC | PRN

## 2017-06-27 NOTE — Progress Notes (Signed)
PROGRESS NOTE  Jaime [name] MWN:[id number] DOB: [birth year] DOA: 06/25/2017 PCP: Katherine Roan, MD  HPI/Recap of past 24 hours: 52 y.o.femalewithhistory of glioblastoma multiforme,seizures and diabetes mellitus who was recently admitted for encephalopathy at the time patient's seizure medication was increased and Vimpat was added and discharged to skilled nursing facility was brought to the ER after patient was found to be getting more hypoxic and altered mental status  06/27/17: Patient seen and examined with her mother at bedside.  She is somnolent however she is arousable to sternal rub.  Palliative care team following.  Patient is now comfort measures only.   Assessment/Plan: Principal Problem:   Acute respiratory failure with hypoxia ([facility]) Active Problems:   Diabetes mellitus type 2 with retinopathy ([place])   DNR (do not resuscitate) discussion   Glioblastoma multiforme of temporal lobe ([place])   Seizures ([place])   Acute metabolic encephalopathy  Patient's mother has decided to make patient comfort measures only on 06/27/2017.  All treatment stopped and comfort measures orders placed by palliative care team.  Acute hypoxic respiratory failure secondary to aspiration pneumonia Acute metabolic encephalopathy multifactorial secondary to above and glioblastoma multiforme Glioblastoma multiforme of temporal lobe Seizures disorder Type 2 diabetes with retinopathy  All intervention leading to comfort measures only.    Code Status: DNR/comfort measures only Family Communication: Mother at bedside.  Disposition Plan: Hospice facility once bed is available   Consultants:  Palliative care team  Procedures:  none  Antimicrobials:  none  DVT prophylaxis:  none   Objective: Vitals:   06/26/17 1320 06/26/17 2136 06/27/17 0122 06/27/17 0624  BP: 105/65 (!) 132/115 (!) 92/57 (!) 89/62  Pulse: 60 73 (!) 50 62  Resp: 20 15 15 15   Temp: 98.7 F (37.1 C) 97.9 F  (36.6 C)  98 F (36.7 C)  TempSrc: Axillary Oral  Oral  SpO2: 100% 100% 100% 98%    Intake/Output Summary (Last 24 hours) at 06/27/2017 1259 Last data filed at 06/27/2017 0400 Gross per 24 hour  Intake 1568.33 ml  Output 200 ml  Net 1368.33 ml   There were no vitals filed for this visit.  Exam:  . General: 52 y.o. year-old female well developed well nourished in no acute distress.  Somnolent but arousable with sternal rub. . Cardiovascular: Regular rate and rhythm with no rubs or gallops.  No thyromegaly or JVD noted.   Marland Kitchen Respiratory: Clear to auscultation with no wheezes or rales. Good inspiratory effort. . Abdomen: Soft nontender nondistended with normal bowel sounds x4 quadrants. . Musculoskeletal: Trace edema in lower extremity bilaterally. 2/4 pulses in all 4 extremities. Marland Kitchen Psychiatry: Unable to assess due to somnolence.   Data Reviewed: CBC: Recent Labs  Lab 06/25/17 1807 06/26/17 0428  WBC 19.2* 19.1*  NEUTROABS 18.2*  --   HGB 13.5 12.8  HCT 40.6 39.1  MCV 89.0 90.3  PLT 264 403   Basic Metabolic Panel: Recent Labs  Lab 06/25/17 1807 06/26/17 0428  NA 142 145  K 3.5 3.5  CL 104 108  CO2 25 24  GLUCOSE 205* 181*  BUN 22* 19  CREATININE 0.55 0.42*  CALCIUM 9.4 9.2   GFR: Estimated Creatinine Clearance: 73.7 mL/min (A) (by C-G formula based on SCr of 0.42 mg/dL (L)). Liver Function Tests: Recent Labs  Lab 06/25/17 1807 06/26/17 0428  AST 31 25  ALT 120* 100*  ALKPHOS 177* 157*  BILITOT 1.0 0.8  PROT 6.4* 6.4*  ALBUMIN 2.9* 3.0*   No results  for input(s): LIPASE, AMYLASE in the last 168 hours. No results for input(s): AMMONIA in the last 168 hours. Coagulation Profile: No results for input(s): INR, PROTIME in the last 168 hours. Cardiac Enzymes: No results for input(s): CKTOTAL, CKMB, CKMBINDEX, TROPONINI in the last 168 hours. BNP (last 3 results) No results for input(s): PROBNP in the last 8760 hours. HbA1C: No results for input(s):  HGBA1C in the last 72 hours. CBG: Recent Labs  Lab 06/26/17 1939 06/26/17 2338 06/27/17 0338 06/27/17 0933 06/27/17 1135  GLUCAP 109* 149* 150* 180* 183*   Lipid Profile: No results for input(s): CHOL, HDL, LDLCALC, TRIG, CHOLHDL, LDLDIRECT in the last 72 hours. Thyroid Function Tests: No results for input(s): TSH, T4TOTAL, FREET4, T3FREE, THYROIDAB in the last 72 hours. Anemia Panel: No results for input(s): VITAMINB12, FOLATE, FERRITIN, TIBC, IRON, RETICCTPCT in the last 72 hours. Urine analysis:    Component Value Date/Time   COLORURINE YELLOW 08/20/2016 0827   APPEARANCEUR HAZY (A) 08/20/2016 0827   LABSPEC 1.010 08/20/2016 0827   LABSPEC 1.010 08/09/2016 1502   PHURINE 5.0 08/20/2016 0827   GLUCOSEU 50 (A) 08/20/2016 0827   GLUCOSEU Negative 08/09/2016 1502   HGBUR NEGATIVE 08/20/2016 0827   BILIRUBINUR NEGATIVE 08/20/2016 0827   BILIRUBINUR Negative 08/09/2016 1502   KETONESUR NEGATIVE 08/20/2016 0827   PROTEINUR NEGATIVE 08/20/2016 0827   UROBILINOGEN 0.2 08/09/2016 1502   NITRITE NEGATIVE 08/20/2016 0827   LEUKOCYTESUR MODERATE (A) 08/20/2016 0827   LEUKOCYTESUR Negative 08/09/2016 1502   Sepsis Labs: @LABRCNTIP (procalcitonin:4,lacticidven:4)  ) Recent Results (from the past 240 hour(s))  Blood culture (routine x 2)     Status: None (Preliminary result)   Collection Time: 06/25/17  6:04 PM  Result Value Ref Range Status   Specimen Description   Final    BLOOD LEFT HAND Performed at [facility], [place] [address] [place], [place] [address]    Special Requests   Final    BOTTLES DRAWN AEROBIC AND ANAEROBIC Blood Culture adequate volume Performed at [facility] [address] [place], [place] [address]    Culture   Final    NO GROWTH < 24 HOURS Performed at [facility] [address], [place], [place] [address]    Report Status PENDING  Incomplete  Blood culture (routine x 2)     Status: None (Preliminary  result)   Collection Time: 06/25/17  6:43 PM  Result Value Ref Range Status   Specimen Description   Final    BLOOD RIGHT WRIST Performed at [facility] [address], [place], [place] [address]    Special Requests   Final    BOTTLES DRAWN AEROBIC AND ANAEROBIC Blood Culture adequate volume Performed at [facility] [address] [place], [place] [address]    Culture   Final    NO GROWTH < 24 HOURS Performed at [facility] [address] [place], [place] [address]    Report Status PENDING  Incomplete  MRSA PCR Screening     Status: None   Collection Time: 06/26/17  4:35 AM  Result Value Ref Range Status   MRSA by PCR NEGATIVE NEGATIVE Final    Comment:        The GeneXpert MRSA Assay (FDA approved for NASAL specimens only), is one component of a comprehensive MRSA colonization surveillance program. It is not intended to diagnose MRSA infection nor to guide or monitor treatment for MRSA infections. Performed at [facility], [place] [address], [place], [place] [address]       Studies: No results found.  Scheduled Meds: . dexamethasone  4 mg Intravenous Q6H  . enoxaparin (LOVENOX) injection  40 mg Subcutaneous Daily  . insulin aspart  0-9 Units Subcutaneous Q4H    Continuous Infusions: . dextrose 5 % and 0.45 % NaCl with KCl 20 mEq/L 100 mL/hr at 06/26/17 1652  . lacosamide (VIMPAT) IV Stopped (06/27/17 1154)  . levETIRAcetam Stopped (06/27/17 1154)  . piperacillin-tazobactam (ZOSYN)  IV Stopped (06/27/17 1000)     LOS: 2 days     Kayleen Memos, MD [facility] Pager [phone]  If 7PM-7AM, please contact night-coverage [email or web address] Password [facility] 06/27/2017, 12:59 PM

## 2017-06-27 NOTE — Consult Note (Signed)
Consultation Note Date: 06/27/2017   Patient Name: Jaime [name]  DOB: May 09, 1965  MRN: [id number]  Age / Sex: 52 y.o., female  PCP: Katherine Roan, MD Referring Physician: Kayleen Memos, DO  Reason for Consultation: Establishing goals of care  HPI/Patient Profile: 52 y.o. female  admitted on 06/25/2017 from [facility] with altered mental status and hypoxia. She has a past medical history significant for left temporal glioblastoma multiforme, seizures, diabetes mellitus, GERD, and hydradenitis.  She was recently admitted for encephalopathy and patient's seizure medication was increased and Vimpat was added. She was then discharged to skilled nursing facility.  During her ED course mother was at the bedside. She reports patient has decreased response over the last 3 days and has hardly eaten anything. CT chest shows features concerning for aspiration pneumonia.  Patient was started on empiric antibiotics.  On exam patient is hardly responsive and patient's mother states this is how she has been for last 3 days prior to admission. Since admission she has received IV antibiotics and medications for seizures.  Palliative medicine team consulted for goals of care discussion.   Clinical Assessment and Goals of Care: I have reviewed medical records including lab results, imaging, Epic notes, and MAR, received report from the bedside RN, and assessed the patient. I then met at the bedside along with patient's mother, Leisel Pinette and patient's sister to discuss diagnosis prognosis, GOC, EOL wishes, disposition and options.  Patient is somnolent at this time with minimal response.  She is unable to participate in goals of care discussion with family.  I introduced Palliative Medicine as specialized medical care for people living with serious illness. It focuses on providing relief from the symptoms and stress of a  serious illness. The goal is to improve quality of life for both the patient and the family.  We discussed a brief life review of the patient.  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  She was loved by many.  She enjoyed playing video games, and spending time with her nieces and nephews.  Her sister also states she loves arts and crafts.  He was never married and had no children.  As far as functional and nutritional status   We discussed her current illness and what it means in the larger context of her on-going co-morbidities.  Natural disease trajectory and expectations at EOL were discussed.  Family became tearful during conversation.  Mother states she is aware that her daughter is declining and knows that she will die soon.  Still is tearful and states she has lived a good life and this disease has taken over what life that she did have.  I attempted to elicit values and goals of care important to the patient.    The difference between aggressive medical intervention and comfort care was considered in light of the patient's goals of care.  At this time family has decided to shift care to comfort care.  They verbalized understanding of  noticing a significant decline over the past 1 to 2 weeks.  They also verbalized understanding that she will not get any better and her quality of life is very poor in her current state.  We discussed what comfort care measures would look like.  Family verbalized understanding and awareness that comfort measures will focus on treating symptoms aggressively such as nausea, pain, shortness of breath, agitation, and anxiety.  This will also include administration of medicines only focus on symptoms and comfort.  Family remains tearful and continues to state this is what is best for her, they only want her to be comfortable during her last moments and not to suffer.  Advanced directives, concepts specific to code  status, artifical feeding and hydration, and rehospitalization were considered and discussed.  At family's request patient is a DNR/DNI.  They are not interested in any forms of life-sustaining measures including artificial feeding, hydration, PEG tube.  Hospice and Palliative Care services outpatient were explained and offered.  Family wishes and goals for patient is to remain comfortable in her last moments of life they are requesting that patient be placed at a residential hospice facility and not to return to [facility].  Family does not have resources and support to care for her in the home, as mother has health issues of her own.  Questions and concerns were addressed.  Hard Choices booklet left for review. The family was encouraged to call with questions or concerns.  PMT will continue to support holistically.  Primary Decision Maker: NEXT OF KIN-Mother, [place]     SUMMARY OF RECOMMENDATIONS    DNR/DNI at family's request.  FULL COMFORT MEASURES at family's request.  Both mother and sister verbalized understanding of comfort measures and focusing on symptoms only.  Social work consult for residential hospice placement.  Discontinue all medications not focused on comfort/symptom management.  We will continue with Decadron, Keppra, and Vimpat for seizure management and comfort.  Thorazine as needed for hiccups.  Robinul as needed for secretions  Ativan as needed for anxiety and seizures  Morphine as needed for pain, dyspnea, shortness of breath  Zofran as needed for nausea  Liquifilm Tears as needed for dry eyes.  Code Status/Advance Care Planning:  DNR/DNI at family's request   Symptom Management:   Thorazine as needed for hiccups.  Robinul as needed for secretions  Ativan as needed for anxiety and seizures  Morphine as needed for pain, dyspnea, shortness of breath  Zofran as needed for nausea  Liquifilm Tears as needed for dry eyes.  Palliative  Prophylaxis:   Aspiration, Eye Care, Frequent Pain Assessment and Oral Care  Additional Recommendations (Limitations, Scope, Preferences):  Full Comfort Care family's request.  Psycho-social/Spiritual:   Desire for further Chaplaincy support:no  Prognosis:   < 2 weeks-in the setting of glioblastoma progression, diabetes, malnutrition, seizures, metabolic encephalopathy, hypoxia, poor p.o. intake, immobility, hypertension, to mental status, pressure sore, and deconditioning.  Discharge Planning: Hospice facility at family's request.     Primary Diagnoses: Present on Admission: . Glioblastoma multiforme of temporal lobe ([place]) . Diabetes mellitus type 2 with retinopathy ([place]) . Acute metabolic encephalopathy . Acute respiratory failure with hypoxia ([place])   I have reviewed the medical record, interviewed the patient and family, and examined the patient. The following aspects are pertinent.  Past Medical History:  Diagnosis Date  . Candida, oral 06/22/2016   Due to steroids 06/22/16  . Diabetes mellitus   . DUB (dysfunctional uterine bleeding)  total abdominal hysterectomy in 2007, ovaries intact  . Furuncle of multiple sites    multiple I and D's  . GERD (gastroesophageal reflux disease)   . Hydradenitis   . Intellectual disability   . 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 Marital status: Single    Spouse name: Not on file  . Number of children: Not on file  . Years of education: Not on file  . Highest education level: Not on file  Occupational History  . Not on file  Social Needs  . Financial resource strain: Not on file  . Food insecurity:    Worry: Not on file    Inability: Not on file  . Transportation needs:    Medical: Not on file    Non-medical: Not on file  Tobacco Use  . Smoking status: Never Smoker  . Smokeless tobacco: Never Used  Substance and Sexual Activity  . Alcohol use: No     Alcohol/week: 0.0 oz  . Drug use: No  . Sexual activity: Not Currently    Birth control/protection: Surgical  Lifestyle  . Physical activity:    Days per week: Not on file    Minutes per session: Not on file  . Stress: Not on file  Relationships  . Social connections:    Talks on phone: Not on file    Gets together: Not on file    Attends religious service: Not on file    Active member of club or organization: Not on file    Attends meetings of clubs or organizations: Not on file    Relationship status: Not on file  Other Topics Concern  . 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   No kids.   No sexual activity   Lives with her mother   Family History  Problem Relation Age of Onset  . Cancer Mother 89       Breast cancer  . Hypertension Mother   . Depression Mother   . Sickle cell trait Mother   . Cancer Father 31       lung cancer, was a long term smoker   Scheduled Meds: . dexamethasone  4 mg Intravenous Q6H  . enoxaparin (LOVENOX) injection  40 mg Subcutaneous Daily  . insulin aspart  0-9 Units Subcutaneous Q4H   Continuous Infusions: . dextrose 5 % and 0.45 % NaCl with KCl 20 mEq/L 100 mL/hr at 06/26/17 1652  . lacosamide (VIMPAT) IV Stopped (06/27/17 1154)  . levETIRAcetam Stopped (06/27/17 1154)  . piperacillin-tazobactam (ZOSYN)  IV Stopped (06/27/17 1000)   PRN Meds:.acetaminophen **OR** acetaminophen, hydrALAZINE, ondansetron **OR** ondansetron (ZOFRAN) IV Medications Prior to Admission:  Prior to Admission medications   Medication Sig Start Date End Date Taking? Authorizing Provider  acetaminophen (TYLENOL) 500 MG tablet Take 1 tablet (500 mg total) by mouth every 6 (six) hours as needed for moderate pain. 02/26/17  Yes Hoffman, Jessica Ratliff, DO  dexamethasone (DECADRON) 4 MG tablet Take 1 tablet (4 mg total) by mouth every 6 (six)  hours as needed. 06/22/17  Yes Velvet Bathe, MD  famotidine (PEPCID) 20 MG tablet Take 1 tablet (20 mg total) by mouth 2 (two) times daily. 07/03/16  Yes Minus Liberty, MD  fluticasone [facility]) 50 MCG/ACT nasal spray Place 2 sprays into both nostrils daily. 06/08/16  Yes Barnet Glasgow, NP  insulin aspart (NOVOLOG FLEXPEN) 100 UNIT/ML FlexPen Inject 3-5 Units into the skin 3 (three) times daily with meals. Patient taking differently: Inject 2-12 Units into the skin 3 (three) times daily with meals. 201-250=2 units, 251-300=4 units, 301-350=6 units, 351-400=8 units, 401-450=10 units, >450=12 units and Repeat CBG in 2 hours. If still above 450 CALL MD. 04/09/17 10/06/17 Yes Katherine Roan, MD  insulin glargine (LANTUS) 100 unit/mL SOPN Inject 0.17 mLs (17 Units total) into the skin at bedtime. 04/09/17  Yes Katherine Roan, MD  lacosamide 100 MG TABS Take 1 tablet (100 mg total) by mouth 2 (two) times daily. 06/22/17  Yes Velvet Bathe, MD  levETIRAcetam (KEPPRA) 500 MG tablet Take 3 tablets (1,500 mg total) by mouth 2 (two) times daily. Patient taking differently: Take 1,500 mg by mouth 3 (three) times daily.  06/05/17  Yes Vaslow, Acey Lav, MD  lisinopril (PRINIVIL,ZESTRIL) 40 MG tablet Take 40 mg by mouth daily.   Yes [provider]  loratadine (CLARITIN) 10 MG tablet Take 1 tablet (10 mg total) by mouth daily. 07/03/16  Yes Minus Liberty, MD  metFORMIN (GLUCOPHAGE) 1000 MG tablet Take 1 tablet (1,000 mg total) by mouth 2 (two) times daily with a meal. 02/20/17 11/17/17 Yes Winfrey, Jenne Pane, MD  montelukast (SINGULAIR) 10 MG tablet Take 1 tablet (10 mg total) by mouth at bedtime. 01/10/17  Yes Ladell Pier, MD  Nutritional Supplements (FEEDING SUPPLEMENT, BOOST BREEZE,) LIQD Take 1 Bottle by mouth 3 (three) times daily.  08/17/16  Yes [provider]  ondansetron (ZOFRAN ODT) 4 MG disintegrating tablet Take 1 tablet (4 mg total) by mouth every 8 (eight) hours as  needed for nausea or vomiting. 07/24/16  Yes Ladell Pier, MD  potassium chloride SA (K-DUR,KLOR-CON) 20 MEQ tablet TAKE 1 TABLET BY MOUTH DAILY 05/10/17  Yes Ladell Pier, MD  ACCU-CHEK SOFTCLIX LANCETS lancets USE TO CHECK BLOOD SUGAR FOUR TIMES DAILY 07/11/16   Minus Liberty, MD  blood glucose meter kit and supplies Dispense based on patient and insurance preference. Use up to four times daily as directed. (FOR ICD-9 250.00, 250.01). 06/21/16   Holley Raring, MD  glucose blood (ACCU-CHEK AVIVA PLUS) test strip USE TO TEST BLOOD SUGAR FOUR TIMES DAILY Diagnosis code:E11.319 02/13/17   Katherine Roan, MD  Insulin Pen Needle 31G X 5 MM MISC Use for injections under the skin as directed. Use 4 times daily. diag code E11.319 insulin dependent 02/13/17   Katherine Roan, MD   No Known Allergies Review of Systems  Unable to perform ROS: Patient nonverbal    Physical Exam  Constitutional: She appears ill.  Somnolent   Cardiovascular: Normal rate, regular rhythm and intact distal pulses.  Pulmonary/Chest: Effort normal. She has decreased breath sounds.  Neurological:  Somnolent   Psychiatric: She expresses inappropriate judgment.  Nursing note and vitals reviewed.   Vital Signs: BP (!) 89/62 (BP Location: Left Arm)   Pulse 62   Temp 98 F (36.7 C) (Oral)   Resp 15   LMP 10/31/2005   SpO2 98%  Pain Scale: PAINAD   Pain Score: Asleep   SpO2: SpO2: 98 % O2 Device:SpO2: 98 % O2 Flow Rate: .O2 Flow Rate (L/min): 2 L/min  IO: Intake/output summary:   Intake/Output Summary (Last 24 hours) at 06/27/2017 1313 Last data filed at 06/27/2017 0400 Gross per 24 hour  Intake 1568.33 ml  Output 200 ml  Net 1368.33 ml    LBM: Last BM Date: 06/25/17  Baseline Weight:   Most recent weight:       Palliative Assessment/Data:PPS 10%   Time In: 1200 Time Out: 1315 Time Total: 75 min.  Greater than 50%  of this time was spent counseling and coordinating care related to the  above assessment and plan.  Signed by: Alda Lea, NP-BC Palliative Medicine Team  Phone: [phone] Fax: [phone] Pager: [phone]   Please contact Palliative Medicine Team phone at [phone] for questions and concerns.  For individual provider: See Shea Evans

## 2017-06-27 NOTE — Progress Notes (Signed)
Initial Nutrition Assessment  Nutrition Brief Note  52 y.o. F admitted on 06/25/17 for acute respiratory failure with hypoxia and MRSA. PMH of T2DM with retinopathy, glioblastoma multiforme of temporal lobe currently on chemo, seizures, acute metabolic encephalopathy.   Patient identified on the Malnutrition Screening Tool (MST) Report.   Per MD note, pt is now on comfort measures only and being followed by palliative care.  No nutrition interventions appropriate at this time. If pt goals of care change, please consult RD.   Hope Budds, Dietetic Intern

## 2017-06-28 ENCOUNTER — Inpatient Hospital Stay: Payer: Medicaid Other

## 2017-06-28 ENCOUNTER — Inpatient Hospital Stay: Payer: Medicaid Other | Admitting: Internal Medicine

## 2017-06-28 MED ORDER — LIP MEDEX EX OINT
TOPICAL_OINTMENT | CUTANEOUS | Status: AC
  Filled 2017-06-28: qty 7

## 2017-06-28 NOTE — Progress Notes (Signed)
CSW spoke with pt's mother following her discussion with PMT- mother confirmed she has decided to pursue comfort care for pt. Residential hospice deemed appropriate. Mother requests [facility] made referral and will follow.  Sharren Bridge, MSW, LCSW Clinical Social Work 06/28/2017 [phone]

## 2017-06-28 NOTE — Discharge Summary (Signed)
Discharge Summary  Jaime [name] TMH:[id number] DOB: 1965/02/23  PCP: Katherine Roan, MD  Admit date: 06/25/2017 Discharge date: 06/28/2017  Time spent: 25 minutes  Recommendations for Outpatient Follow-up:  1. Follow-up with hospice at [facility]  Discharge Diagnoses:  Active Hospital Problems   Diagnosis Date Noted  . Acute respiratory failure with hypoxia ([place]) 06/25/2017  . Acute metabolic encephalopathy 92/12/9415  . Seizures ([place]) 06/10/2017  . Glioblastoma multiforme of temporal lobe ([place]) 07/11/2016  . DNR (do not resuscitate) discussion 09/23/2013  . Diabetes mellitus type 2 with retinopathy ([place]) 11/30/2009    Resolved Hospital Problems  No resolved problems to display.    Discharge Condition: Stable  Vitals:   06/28/17 0450 06/28/17 0835  BP: 104/70   Pulse: 96   Resp: 16   Temp: 98.7 F (37.1 C)   SpO2: 98% 94%    History of present illness:  52 y.o.femalewithhistory of glioblastoma multiforme,seizures and diabetes mellitus who was recently admitted for encephalopathy. At the time, patient's seizure medication was increased and Vimpat was added. Discharged to skilled nursing facility.  She was brought to the ER after patient was found to be getting more hypoxic and altered mental status.  06/27/17: Patient made comfort measures only by her significant other.  06/28/17: Patient appears comfortable.  On the day of discharge the patient was hemodynamically stable.  She will be transferred to [facility] where she will continue to receive hospice care.   Hospital Course:  Principal Problem:   Acute respiratory failure with hypoxia (HCC) Active Problems:   Diabetes mellitus type 2 with retinopathy ([place])   DNR (do not resuscitate) discussion   Glioblastoma multiforme of temporal lobe ([place])   Seizures ([place])   Acute metabolic encephalopathy  Patient's mother has decided to make patient comfort measures only on 06/27/2017.  All treatment stopped  and comfort measures orders placed by palliative care team.  Acute hypoxic respiratory failure secondary to aspiration pneumonia Acute metabolic encephalopathy multifactorial secondary to above and glioblastoma multiforme Glioblastoma multiforme of temporal lobe Seizures disorder Type 2 diabetes with retinopathy Sickle cell trait  All intervention leading to comfort measures only.   Procedures:  None  Consultations:  Palliative care team  Discharge Exam: BP 104/70 (BP Location: Right Arm)   Pulse 96   Temp 98.7 F (37.1 C) (Oral)   Resp 16   LMP 10/31/2005   SpO2 94%  . General: 52 y.o. year-old female well developed well nourished in no acute distress.  Somnolent.   . Cardiovascular: Regular rate and rhythm with no rubs or gallops.  No thyromegaly or JVD noted.   Marland Kitchen Respiratory: Diffuse bilateral rhonchorous sounds.  Discharge Instructions You were cared for by a hospitalist during your hospital stay. If you have any questions about your discharge medications or the care you received while you were in the hospital after you are discharged, you can call the unit and asked to speak with the hospitalist on call if the hospitalist that took care of you is not available. Once you are discharged, your primary care physician will handle any further medical issues. Please note that NO REFILLS for any discharge medications will be authorized once you are discharged, as it is imperative that you return to your primary care physician (or establish a relationship with a primary care physician if you do not have one) for your aftercare needs so that they can reassess your need for medications and monitor your lab values.   Allergies as of  06/28/2017   No Known Allergies     Medication List    STOP taking these medications   ACCU-CHEK SOFTCLIX LANCETS lancets   acetaminophen 500 MG tablet Commonly known as:  TYLENOL   blood glucose meter kit and supplies   dexamethasone 4 MG  tablet Commonly known as:  DECADRON   famotidine 20 MG tablet Commonly known as:  PEPCID   feeding supplement (BOOST BREEZE) Liqd   fluticasone 50 MCG/ACT nasal spray Commonly known as:  FLONASE   glucose blood test strip Commonly known as:  ACCU-CHEK AVIVA PLUS   insulin aspart 100 UNIT/ML FlexPen Commonly known as:  NOVOLOG FLEXPEN   insulin glargine 100 unit/mL Sopn Commonly known as:  LANTUS   Insulin Pen Needle 31G X 5 MM Misc   Lacosamide 100 MG Tabs   levETIRAcetam 500 MG tablet Commonly known as:  KEPPRA   lisinopril 40 MG tablet Commonly known as:  PRINIVIL,ZESTRIL   loratadine 10 MG tablet Commonly known as:  CLARITIN   metFORMIN 1000 MG tablet Commonly known as:  GLUCOPHAGE   montelukast 10 MG tablet Commonly known as:  SINGULAIR   ondansetron 4 MG disintegrating tablet Commonly known as:  ZOFRAN ODT   potassium chloride SA 20 MEQ tablet Commonly known as:  K-DUR,KLOR-CON      No Known Allergies Follow-up Information    Orpah Melter, MD. Call.   Specialty:  Internal Medicine Why:  Please call for appointment. Contact information: [place] [address] [phone]            The results of significant diagnostics from this hospitalization (including imaging, microbiology, ancillary and laboratory) are listed below for reference.    Significant Diagnostic Studies: Dg Chest 2 View  Result Date: 06/21/2017 CLINICAL DATA:  Pneumonia, short of breath EXAM: CHEST - 2 VIEW COMPARISON:  CT 06/10/2017, radiograph 08/20/2016 FINDINGS: Minimal opacity at the lingula and left base. No pleural effusion. Borderline cardiomegaly. Low lung volumes. No pneumothorax. IMPRESSION: Mild lingular and left base opacity, may reflect atelectasis or minimal residual infiltrate. Electronically Signed   By: Donavan Foil M.D.   On: 06/21/2017 18:41   Ct Head Wo Contrast  Result Date: 06/25/2017 CLINICAL  DATA:  Decreasing level of consciousness.  History of GBM. EXAM: CT HEAD WITHOUT CONTRAST TECHNIQUE: Contiguous axial images were obtained from the base of the skull through the vertex without intravenous contrast. COMPARISON:  Multiple priors. Most recent MR 06/01/2017. Most recent CT 06/10/2017. 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 Communicating hydrocephalus, with enlargement of the lateral, third, and fourth ventricles appears similar, with transependymal absorption. Moderate surrounding vasogenic edema extends into the temporal, parietal, and occipital lobes. 5 mm of LEFT-to-RIGHT shift compares favorably with most recent CT. No confound in hemorrhage. No extra-axial fluid. Hydrocephalus not significantly worse. Vascular: No hyperdense vessel or unexpected calcification. Skull: No fracture or focal lesion. Sinuses/Orbits: No sinus fluid.  Negative orbits. Other: None. IMPRESSION: Recurrent glioblastoma multiforme not significantly progressed from prior CT or MR. No intervening hemorrhage. LEFT-to-RIGHT shift essentially stable. No worsening hydrocephalus. Electronically Signed   By: Staci Righter M.D.   On: 06/25/2017 20:42   Ct Head Wo Contrast  Result Date: 06/10/2017 CLINICAL DATA:  Altered mental status. 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 EXAM: CT HEAD WITHOUT CONTRAST TECHNIQUE: Contiguous axial images were obtained from the base of the skull through  the vertex without intravenous contrast. COMPARISON:  Brain MRI 06/01/2017.  Head CT scan 06/18/2016. FINDINGS: Brain: Again seen is a large mass lesion in the left temporal lobe with extensive surrounding vasogenic edema. Discrete measurement of this lesion is difficult on uninfused CT scan but it is not grossly changed since the most recent study. There is associated left-to-right midline shift of approximately 0.7 cm, unchanged.  Hydrocephalus also appears unchanged. No hemorrhage is identified. Vascular: No hyperdense vessel or unexpected calcification. Skull: Intact. Sinuses/Orbits: Negative. Other: None. IMPRESSION: No marked change in the patient's left temporal lobe glioblastoma multiforme with associated extensive vasogenic edema, left to right midline shift and hydrocephalus since the patient's brain MRI 06/01/2017. Electronically Signed   By: Inge Rise M.D.   On: 06/10/2017 17:08   Ct Angio Chest Pe W And/or Wo Contrast  Result Date: 06/25/2017 CLINICAL DATA:  Evaluate for pulmonary embolus. EXAM: CT ANGIOGRAPHY CHEST WITH CONTRAST TECHNIQUE: Multidetector CT imaging of the chest was performed using the standard protocol during bolus administration of intravenous contrast. Multiplanar CT image reconstructions and MIPs were obtained to evaluate the vascular anatomy. CONTRAST:  26m ISOVUE-370 IOPAMIDOL (ISOVUE-370) INJECTION 76% COMPARISON:  06/10/2017 FINDINGS: Cardiovascular: Mild cardiac enlargement. No pericardial effusion. The main pulmonary artery is patent. No central obstructing embolus. No lobar or segmental pulmonary artery filling defects identified to suggest acute pulmonary embolus. Mediastinum/Nodes: Normal appearance of the thyroid gland. The trachea appears patent and is midline. Normal appearance of the esophagus. No enlarged mediastinal or hilar lymph nodes. Lungs/Pleura: The left mainstem bronchus from the level of the carina and the left lower lobe bronchi are completely occluded with debris. There is dense airspace consolidation involving nearly the entire left lower lobe. No discrete mass is identified. There is also opacification scratch set there also filling defects identified within the left upper lobe airway without significant left upper lobe atelectasis or consolidation. Patchy areas of ground-glass attenuation noted scattered within the right lung. Upper Abdomen: No acute abnormality.  Musculoskeletal: No chest wall abnormality. No acute or significant osseous findings. Review of the MIP images confirms the above findings. IMPRESSION: 1. No evidence for acute pulmonary embolus. 2. Dense airspace consolidation involving much of the entire left lower lobe is identified. Findings are compatible with aspiration and/or pneumonia. 3. Occlusion of the left mainstem bronchus and its branches are noted extending into the left upper lobe and left lower lobe which may represent aspiration. Follow-up imaging is advised two ensure complete resolution and to rule out underlying neoplastic process. Electronically Signed   By: TKerby MoorsM.D.   On: 06/25/2017 20:41   Ct Angio Chest Pe W Or Wo Contrast  Result Date: 06/11/2017 CLINICAL DATA:  Acute onset of tachycardia.  Mental status changes. EXAM: CT ANGIOGRAPHY CHEST WITH CONTRAST TECHNIQUE: Multidetector CT imaging of the chest was performed using the standard protocol during bolus administration of intravenous contrast. Multiplanar CT image reconstructions and MIPs were obtained to evaluate the vascular anatomy. CONTRAST:  1025mISOVUE-370 IOPAMIDOL (ISOVUE-370) INJECTION 76% COMPARISON:  CT of the chest performed 06/20/2016 FINDINGS: Cardiovascular:  There is no evidence of pulmonary embolus. The heart is borderline normal in size. The thoracic aorta is grossly unremarkable. The great vessels are within normal limits. Mediastinum/Nodes: The mediastinum is unremarkable in appearance. No mediastinal lymphadenopathy is seen. No pericardial effusion is identified. The visualized portions of the thyroid gland are unremarkable. No axillary lymphadenopathy is seen. Lungs/Pleura: Focal airspace opacity is noted at the left lingula and left lung base.  Minimal airspace opacity was noted at the left lingula in 2018. Given its appearance, this more likely reflects pneumonia. Would consider follow-up PA and lateral chest radiograph after completion of treatment  for pneumonia. Upper Abdomen: The visualized portions of the liver and spleen are unremarkable. The gallbladder is grossly unremarkable in appearance. The visualized portions of the pancreas and adrenal glands are within normal limits. Mild nonspecific perinephric stranding is noted bilaterally. Musculoskeletal: No acute osseous abnormalities are identified. The visualized musculature is unremarkable in appearance. Review of the MIP images confirms the above findings. IMPRESSION: 1. No evidence of pulmonary embolus. 2. Focal airspace opacity at the left lingula and left air base. This is more prominent than in 2018. Given its appearance, this more likely reflects pneumonia. Followup PA and lateral chest X-ray is recommended in 3-4 weeks following trial of antibiotic therapy to ensure resolution and exclude underlying malignancy. Electronically Signed   By: Garald Balding M.D.   On: 06/11/2017 00:08   Mr Jeri Cos MA Contrast  Result Date: 06/01/2017 CLINICAL DATA:  Glioblastoma multiform left temporal lobe EXAM: MRI HEAD WITHOUT AND WITH CONTRAST TECHNIQUE: Multiplanar, multiecho pulse sequences of the brain and surrounding structures were obtained without and with intravenous contrast. CONTRAST:  63m MULTIHANCE GADOBENATE DIMEGLUMINE 529 MG/ML IV SOLN COMPARISON:  MRI 04/09/2017, 08/11/2016 FINDINGS: Brain: Marked progression of enhancing tumor in the left medial temporal lobe. Lobular masslike area of enhancement now measures approximately 5.7 x 3.8 x 4.3 cm. This projects into the left lateral ventricle. This component of the mass shows progressive susceptibility suggesting hemorrhage which has developed since the prior study. Significant progression of white matter hyperintensity throughout the left temporal lobe with extension into the left occipital parietal lobe. Nodular masslike enhancement in the perimesencephalic cistern anteriorly and on the right has progressed compatible with CSF spread of tumor.  Small amount of enhancement in the floor of the fourth ventricle may also represent tumor. Progressive hydrocephalus. Progressive midline shift now approximately 7 mm. Progressive periventricular white matter hyperintensity. Some of this is likely due to transependymal resorption of CSF due to hydrocephalus. Negative for acute infarct. Vascular: Normal arterial flow void Skull and upper cervical spine: Negative Sinuses/Orbits: Negative Other: None IMPRESSION: Rapid progression of tumor since the prior study. The main component of the tumor in the left temporal lobe shows significant progression in size in interval hemorrhage. Extensive white matter hyperintensity throughout the left temporal lobe extending into the left occipital parietal lobes also has progressed. Increased mass-effect and 7 mm midline shift to the right. 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 Progressive hydrocephalus These results will be called to the ordering clinician or representative by the Radiologist Assistant, and communication documented in the PACS or zVision Dashboard. Electronically Signed   By: CFranchot GalloM.D.   On: 06/01/2017 17:23   Dg Chest Port 1 View  Result Date: 06/25/2017 CLINICAL DATA:  Shortness of breath and hypoxia. EXAM: PORTABLE CHEST 1 VIEW COMPARISON:  06/21/17. FINDINGS: The heart size and mediastinal contours are within normal limits. No pleural effusion. Left base opacity is new compared with previous exam. Mild subsegmental atelectasis noted in the lung bases. IMPRESSION: 1. Left base opacity suspicious for pneumonia. 2. Bibasilar atelectasis. Electronically Signed   By: TKerby MoorsM.D.   On: 06/25/2017 17:52    Microbiology: Recent Results (from the past 240 hour(s))  Blood culture (routine x 2)     Status: None (Preliminary result)  Collection Time: 06/25/17  6:04 PM  Result Value Ref Range Status   Specimen Description   Final     BLOOD LEFT HAND Performed at [facility] [address] [place], [place] [address]    Special Requests   Final    BOTTLES DRAWN AEROBIC AND ANAEROBIC Blood Culture adequate volume Performed at [facility] [address] [place], [place] [address]    Culture   Final    NO GROWTH 2 DAYS Performed at [facility] [address] [place], [place] [address]    Report Status PENDING  Incomplete  Blood culture (routine x 2)     Status: None (Preliminary result)   Collection Time: 06/25/17  6:43 PM  Result Value Ref Range Status   Specimen Description   Final    BLOOD RIGHT WRIST Performed at [facility] [address] [place], [place] [address]    Special Requests   Final    BOTTLES DRAWN AEROBIC AND ANAEROBIC Blood Culture adequate volume Performed at [facility] [address] [place], [place] [address]    Culture   Final    NO GROWTH 2 DAYS Performed at [facility] [address], [place], [place] [address]    Report Status PENDING  Incomplete  MRSA PCR Screening     Status: None   Collection Time: 06/26/17  4:35 AM  Result Value Ref Range Status   MRSA by PCR NEGATIVE NEGATIVE Final    Comment:        The GeneXpert MRSA Assay (FDA approved for NASAL specimens only), is one component of a comprehensive MRSA colonization surveillance program. It is not intended to diagnose MRSA infection nor to guide or monitor treatment for MRSA infections. Performed at [facility], [place] [address], [place], [place] [address]      Labs: Basic Metabolic Panel: Recent Labs  Lab 06/25/17 1807 06/26/17 0428  NA 142 145  K 3.5 3.5  CL 104 108  CO2 25 24  GLUCOSE 205* 181*  BUN 22* 19  CREATININE 0.55 0.42*  CALCIUM 9.4 9.2   Liver Function Tests: Recent Labs  Lab 06/25/17 1807 06/26/17 0428  AST 31 25  ALT 120* 100*  ALKPHOS 177* 157*  BILITOT 1.0 0.8    PROT 6.4* 6.4*  ALBUMIN 2.9* 3.0*   No results for input(s): LIPASE, AMYLASE in the last 168 hours. No results for input(s): AMMONIA in the last 168 hours. CBC: Recent Labs  Lab 06/25/17 1807 06/26/17 0428  WBC 19.2* 19.1*  NEUTROABS 18.2*  --   HGB 13.5 12.8  HCT 40.6 39.1  MCV 89.0 90.3  PLT 264 246   Cardiac Enzymes: No results for input(s): CKTOTAL, CKMB, CKMBINDEX, TROPONINI in the last 168 hours. BNP: BNP (last 3 results) Recent Labs    08/20/16 1545  BNP 39.9    ProBNP (last 3 results) No results for input(s): PROBNP in the last 8760 hours.  CBG: Recent Labs  Lab 06/26/17 2338 06/27/17 0338 06/27/17 0933 06/27/17 1135 06/27/17 1643  GLUCAP 149* 150* 180* 183* 86       Signed:  Kayleen Memos, MD [facility] 06/28/2017, 12:32 PM

## 2017-06-28 NOTE — Consult Note (Signed)
[facility])  Received request from [place] for family interest in [facility]. Chart reviewed and met with patient's mother to complete paper work for transfer today. Appreciate report from PMT NP [facility]. Dr. Tomasa Hosteller to assume end of life care per patient's mother.   Please send discharge summary to [phone].  RN please call report to [phone].  Thank you,  Erling Conte, LCSW [phone]

## 2017-06-28 NOTE — Progress Notes (Signed)
Daily Progress Note   Patient Name: Jaime [name]       Date: 06/28/2017 DOB: September 01, 1965  Age: 52 y.o. MRN#: [id number] Attending Physician: Kayleen Memos, DO Primary Care Physician: Katherine Roan, MD Admit Date: 06/25/2017  Reason for Consultation/Follow-up: Establishing goals of care  Subjective: Patient remains somnolent today with no arousal. Mother at bedside. Mother states she is appreciative of care and daughter looks very comfortable. No apparent signs of discomfort or distress. Shallow respirations. Waldon Reining with [facility] is on department and planning to meet with mother and review paperwork and criteria for hopeful transfer to [facility] today. Mother verbalized understanding and that all family is aware of current conditions. Offered support to mom.   Chart reviewed and report received from Bedside, RN. Elsie.   Length of Stay: 3  Current Medications: Scheduled Meds:  . dexamethasone  4 mg Intravenous Q6H    Continuous Infusions: . chlorproMAZINE (THORAZINE) IV Stopped (06/27/17 1803)  . dextrose 5 % and 0.45 % NaCl with KCl 20 mEq/L 10 mL/hr at 06/27/17 1658  . lacosamide (VIMPAT) IV Stopped (06/27/17 2257)  . levETIRAcetam 1,500 mg (06/28/17 0948)    PRN Meds: antiseptic oral rinse, chlorproMAZINE (THORAZINE) IV, glycopyrrolate **OR** glycopyrrolate, LORazepam, morphine injection, morphine CONCENTRATE, [DISCONTINUED] ondansetron **OR** ondansetron (ZOFRAN) IV, polyvinyl alcohol  Physical Exam     Constitutional: She appears ill.  Somnolent   Cardiovascular: Normal rate, regular rhythm and intact distal pulses.  Pulmonary/Chest: Effort normal. She has decreased breath sounds.  Neurological:  Somnolent   Psychiatric: She expresses inappropriate judgment.    Nursing note and vitals reviewed.   Vital Signs: BP 104/70 (BP Location: Right Arm)   Pulse 96   Temp 98.7 F (37.1 C) (Oral)   Resp 16   LMP 10/31/2005   SpO2 98%  SpO2: SpO2: 98 % O2 Device: O2 Device: Nasal Cannula O2 Flow Rate: O2 Flow Rate (L/min): 4 L/min  Intake/output summary:   Intake/Output Summary (Last 24 hours) at 06/28/2017 1016 Last data filed at 06/28/2017 0500 Gross per 24 hour  Intake 866 ml  Output 250 ml  Net 616 ml   LBM: Last BM Date: 06/25/17 Baseline Weight:   Most recent weight:         Palliative Assessment/Data:PPS 10%    Patient Active Problem List  Diagnosis Date Noted  . Acute respiratory failure with hypoxia ([place]) 06/25/2017  . HCAP (healthcare-associated pneumonia)   . Pressure injury of skin 06/15/2017  . DNR (do not resuscitate)   . Altered mental status   . Acute metabolic encephalopathy 40/98/1191  . Brain mass 06/10/2017  . Seizures ([place]) 06/10/2017  . Healthcare maintenance 02/26/2017  . Leg pain, anterior, left 01/18/2017  . Protein-calorie malnutrition, severe 08/21/2016  . Symptomatic anemia 08/20/2016  . Dehydration 08/20/2016  . Acute prerenal azotemia 08/20/2016  . SIRS (systemic inflammatory response syndrome) ([place]) 08/20/2016  . Glioblastoma multiforme of temporal lobe ([place]) 07/11/2016  . Furuncle of labia majora 06/29/2016  . 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 Palliative care by specialist   . HLD (hyperlipidemia) 06/18/2016  . GERD (gastroesophageal reflux disease) 06/18/2016  . Brain tumor ([place])   . Nonproliferative diabetic retinopathy ([place]) 12/17/2015  . Onychomycosis of left great toe 01/13/2014  . DNR (do not resuscitate) discussion 09/23/2013  . Essential hypertension 06/11/2013  . Diabetes mellitus type 2 with retinopathy ([place]) 11/30/2009  . Obesity 09/28/2009  . SICKLE-CELL TRAIT 09/28/2009    Palliative Care Assessment & Plan   Patient Profile: 52 y.o. female  admitted on  06/25/2017 from [facility] with altered mental status and hypoxia. She has a past medical history significant for left temporal glioblastoma multiforme,seizures, diabetes mellitus, GERD, and hydradenitis.  She was recently admitted for encephalopathy and patient's seizure medication was increased and Vimpat was added. She was then discharged to skilled nursing facility.  During her ED course mother was at the bedside. She reports patient has decreased response over the last 3 days and has hardly eaten anything. CT chest shows features concerning for aspiration pneumonia. Patient was started on empiric antibiotics. On exam patient is hardly responsive and patient's mother states this is how she has been for last 3 days prior to admission. Since admission she has received IV antibiotics and medications for seizures.  Palliative medicine team consulted for goals of care discussion.   Recommendations/Plan:  DNR/DNI at family's request.  FULL COMFORT MEASURES at family's request.  Mom at bedside, expressing some emotions. She is grateful daughter appears comfortable and will be able to have a peaceful transition without major suffering.   Spoke with Waldon Reining with [facility] and she is preparing to complete all necessary paperwork and arrange for patient to transfer to [facility] as requested by mother.   We will continue with Decadron, Keppra, and Vimpat for seizure management and comfort, while hospitalized.  Thorazine as needed for hiccups.  Robinul as needed for secretions  Ativan as needed for anxiety and seizures  Morphine as needed for pain, dyspnea, shortness of breath  Zofran as needed for nausea  Liquifilm Tears as needed for dry eyes.  Goals of Care and Additional Recommendations:  Limitations on Scope of Treatment: Full Comfort Care  Code Status:    Code Status Orders  (From admission, onward)        Start     Ordered   06/27/17 1504  Do not attempt resuscitation (DNR)   Continuous    Question Answer Comment  In the event of cardiac or respiratory ARREST Do not call a "code blue"   In the event of cardiac or respiratory ARREST Do not perform Intubation, CPR, defibrillation or ACLS   In the event of cardiac or respiratory ARREST Use medication by any route, position, wound care, and other measures to relive pain and suffering. May use oxygen,  suction and manual treatment of airway obstruction as needed for comfort.      06/27/17 1508    Code Status History    Date Active Date Inactive Code Status Order ID Comments User Context   06/25/2017 2330 06/27/2017 1508 DNR [id number]  Rise Patience, MD ED   06/25/2017 2100 06/25/2017 2330 DNR [id number]  Drenda Freeze, MD ED   06/10/2017 2243 06/23/2017 1555 DNR [id number]  Rise Patience, MD ED   08/20/2016 1532 08/26/2016 1632 DNR [id number]  Benito Mccreedy, MD Inpatient   06/18/2016 0543 06/22/2016 2134 Full Code [id number]  Omar Person, NP ED    Advance Directive Documentation     Most Recent Value  Type of Advance Directive  Healthcare Power of Attorney, Out of facility DNR (pink MOST or yellow form)  Pre-existing out of facility DNR order (yellow form or pink MOST form)  Yellow form placed in chart (order not valid for inpatient use)  "MOST" Form in Place?  -       Prognosis:   < 2 weeks in the setting of glioblastoma progression, diabetes, malnutrition, seizures, metabolic encephalopathy, hypoxia, poor p.o. intake, immobility, hypertension, to mental status, pressure sore, and deconditioning.  Discharge Planning:  Hospice facility  Care plan was discussed with patien't mother, [place]on [place], [place] for [facility], Dr. Nevada Crane, and bedside RN.   Thank you for allowing the Palliative Medicine Team to assist in the care of this patient.   Total Time 25 min. Prolonged Time Billed  NO       Greater than 50%  of this time was spent counseling and coordinating care related to the above assessment and  plan.  Alda Lea, NP  Please contact Palliative Medicine Team phone at [phone] for questions and concerns.

## 2017-06-28 NOTE — Progress Notes (Signed)
Pt discharged with plan to admit to [facility] for hospice care report [phone]   Arranged [facility] transportation.  Mother completed admission paperwork with [facility].  Sharren Bridge, MSW, LCSW Clinical Social Work 06/28/2017 [phone]

## 2017-06-30 LAB — CULTURE, BLOOD (ROUTINE X 2)
CULTURE: NO GROWTH
Culture: NO GROWTH
SPECIAL REQUESTS: ADEQUATE
SPECIAL REQUESTS: ADEQUATE

## 2017-07-02 ENCOUNTER — Encounter: Payer: Medicaid Other | Admitting: Internal Medicine

## 2017-07-05 ENCOUNTER — Other Ambulatory Visit: Payer: Medicaid Other

## 2017-07-05 ENCOUNTER — Ambulatory Visit: Payer: Medicaid Other | Admitting: Internal Medicine

## 2017-07-05 ENCOUNTER — Ambulatory Visit: Payer: Medicaid Other

## 2017-07-19 ENCOUNTER — Ambulatory Visit: Payer: Medicaid Other | Admitting: Internal Medicine

## 2017-07-19 ENCOUNTER — Other Ambulatory Visit: Payer: Medicaid Other

## 2017-07-19 ENCOUNTER — Ambulatory Visit: Payer: Medicaid Other

## 2017-07-30 DEATH — deceased

## 2017-08-24 ENCOUNTER — Ambulatory Visit: Payer: Self-pay | Admitting: Podiatry

## 2018-11-30 IMAGING — CT CT CHEST W/ CM
2 of 5 series · 14 of 46 positions shown, 16 images · IV contrast (iopamidol)
Comparison: None.

CLINICAL DATA: New diagnosis of left temporal lobe intra-axial
cerebral mass, suspected to represent a high-grade glioma on MRI.

EXAM:
CT CHEST, ABDOMEN, AND PELVIS WITH CONTRAST
TECHNIQUE: Multidetector CT imaging of the chest, abdomen and pelvis was
performed following the standard protocol during bolus
administration of intravenous contrast.
CONTRAST:  100mL [id number] IOPAMIDOL ([id number]) INJECTION 61%

[Series 3: abdroutine 5.0 i31s 1 · axial · 0.72mm/px · z∈[-628,-38]mm · 11 of 134 slices shown, 13 images]
[im 8/134  soft-tissue]
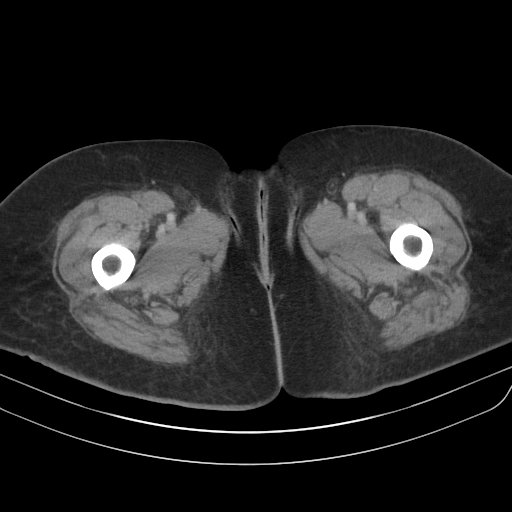
[im 8/134  bone]
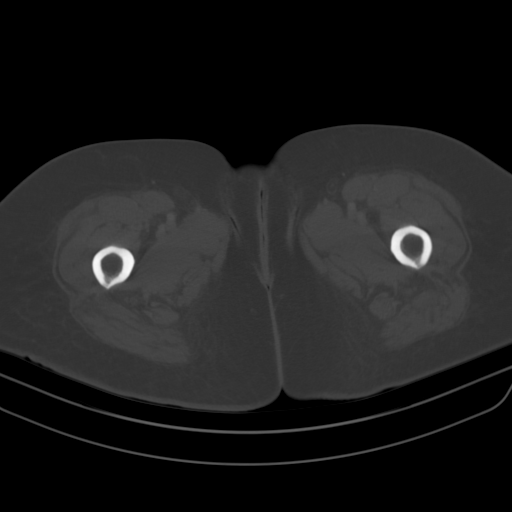
[im 24/134  soft-tissue]
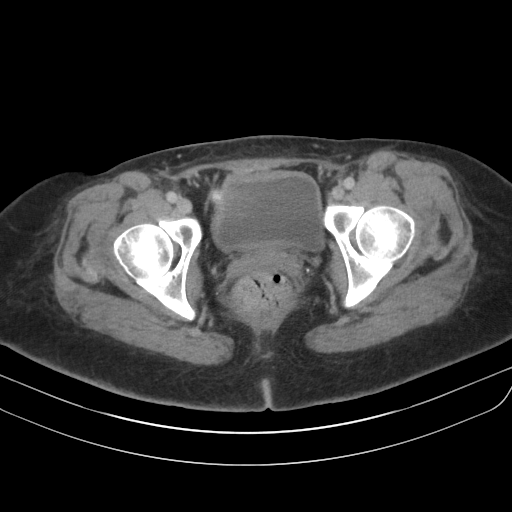
[im 32/134  soft-tissue]
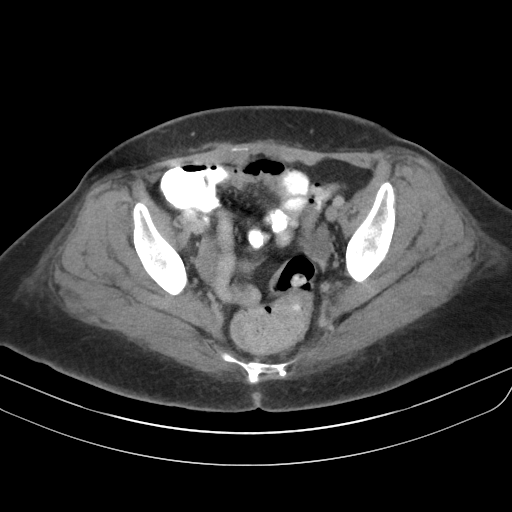
[im 47/134  soft-tissue]
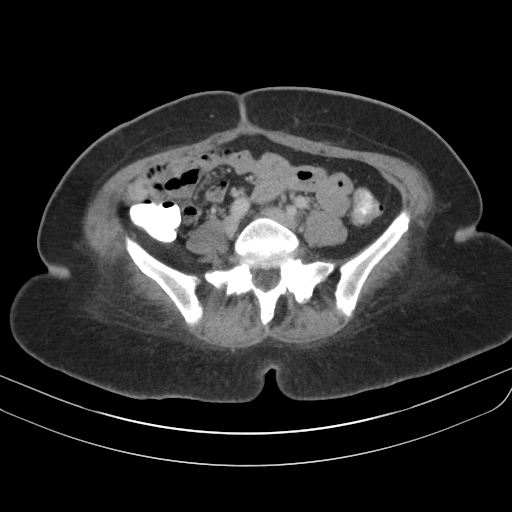
[im 55/134  soft-tissue]
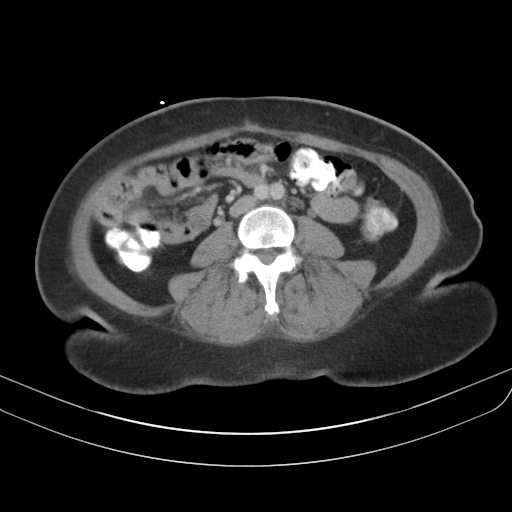
[im 71/134  soft-tissue]
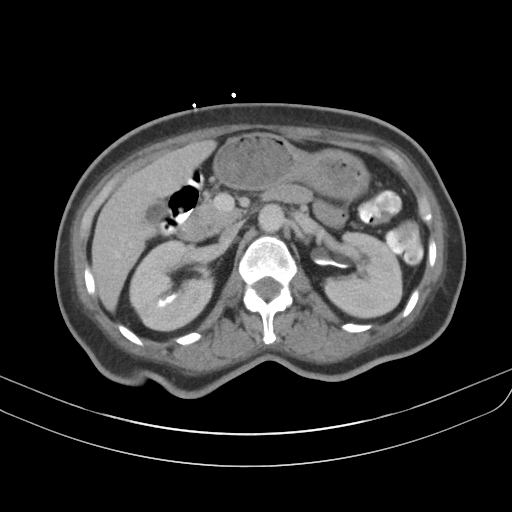
[im 79/134  soft-tissue]
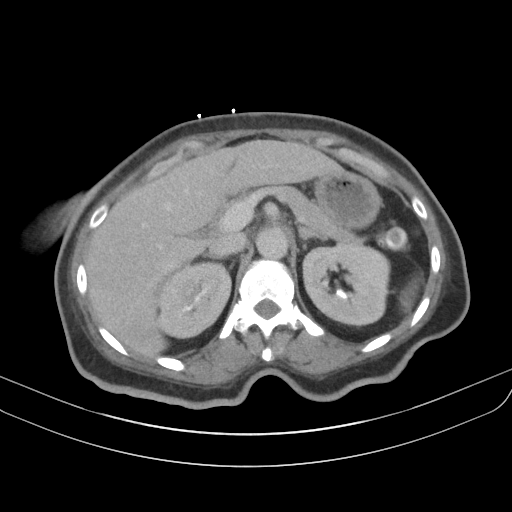
[im 87/134  soft-tissue]
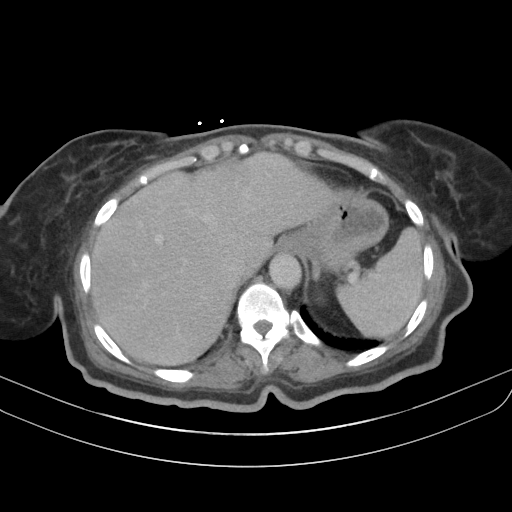
[im 102/134  soft-tissue]
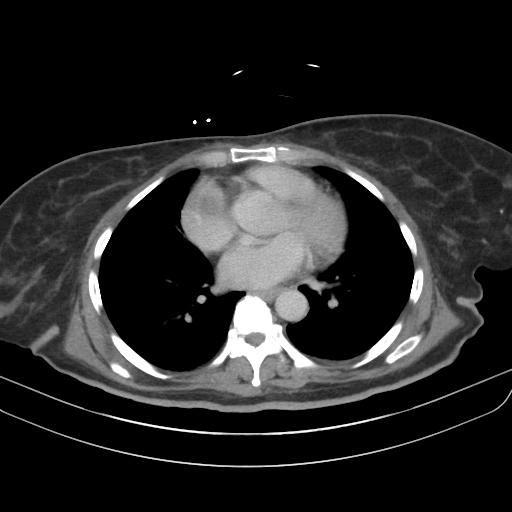
[im 102/134  bone]
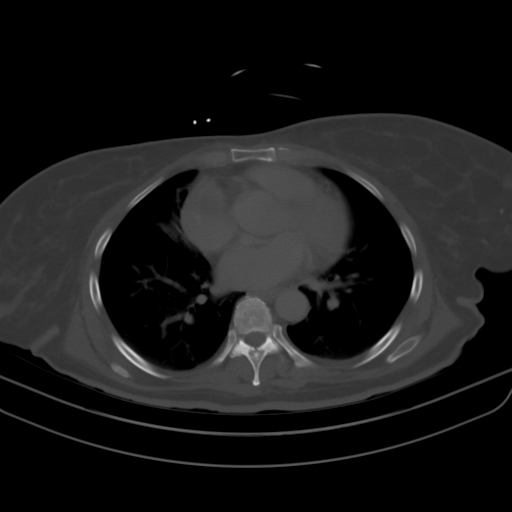
[im 110/134  soft-tissue]
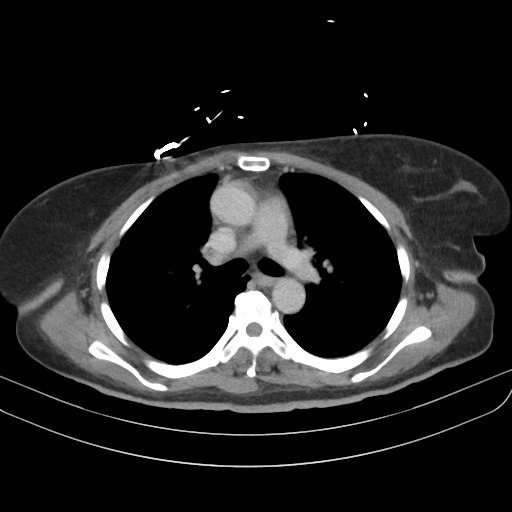
[im 126/134  soft-tissue]
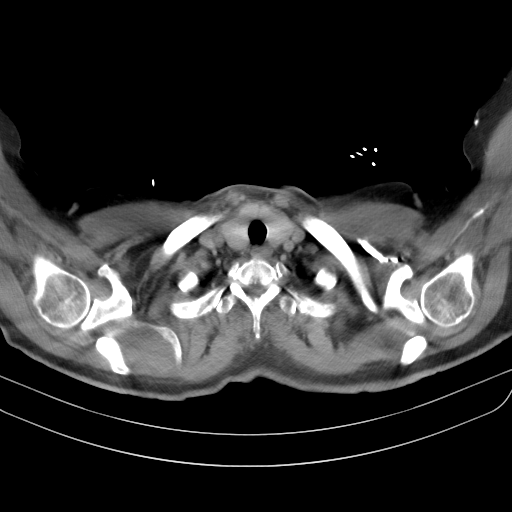

[Series 6: abdroutine 2.0 mpr cor · coronal · 0.75mm/px · 3 of 100 slices shown]
[im 34/100  soft-tissue]
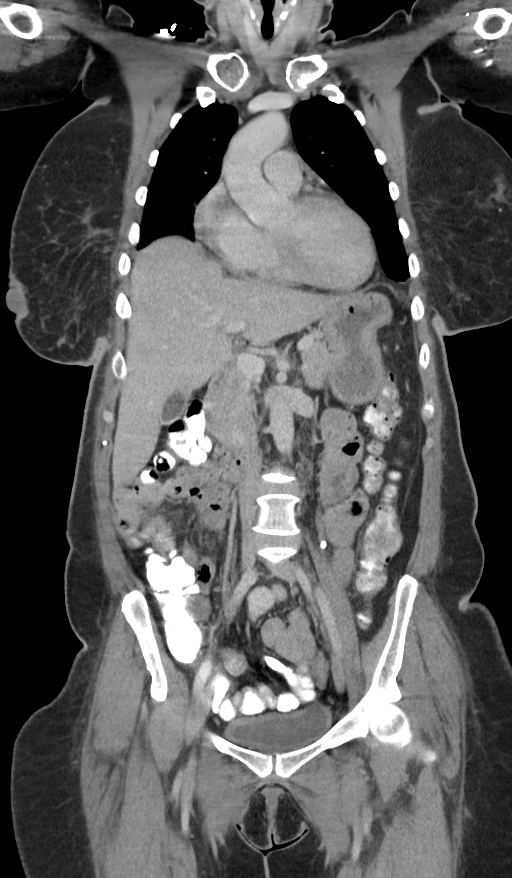
[im 45/100  soft-tissue]
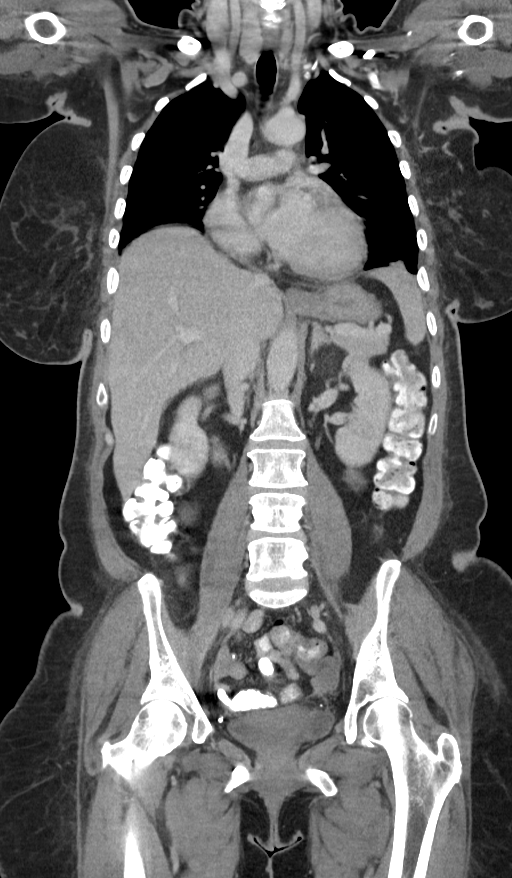
[im 56/100  soft-tissue]
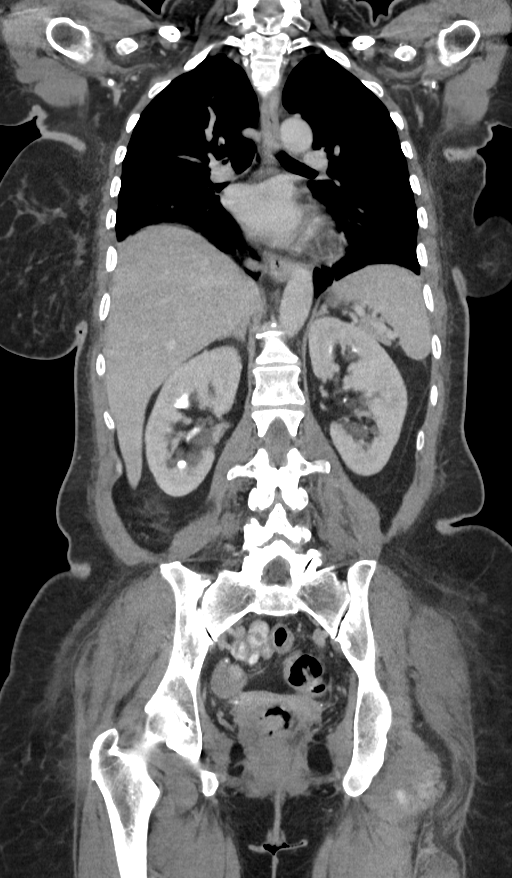

[14 of 46 positions shown; findings below may reference images not displayed]

FINDINGS: CT CHEST FINDINGS

Cardiovascular: Normal heart size. No significant pericardial
fluid/thickening. Great vessels are normal in course and caliber. No
central pulmonary emboli.

Mediastinum/Nodes: No discrete thyroid nodules. Unremarkable
esophagus. No pathologically enlarged axillary, mediastinal or hilar
lymph nodes.

Lungs/Pleura: No pneumothorax. No pleural effusion. No acute
consolidative airspace disease, lung masses or significant pulmonary
nodules. Minimal scarring versus atelectasis in the right middle
lobe and lingula.

Musculoskeletal: No aggressive appearing focal osseous lesions. Mild
thoracic spondylosis.

CT ABDOMEN PELVIS FINDINGS

Hepatobiliary: Normal liver with no liver mass. Normal gallbladder
with no radiopaque cholelithiasis. No biliary ductal dilatation.

Pancreas: Normal, with no mass or duct dilation.

Spleen: Normal size. No mass.

Adrenals/Urinary Tract: Normal adrenals. Small simple parapelvic
renal cysts in both kidneys. No hydronephrosis. No suspicious renal
masses. Normal bladder.

Stomach/Bowel: Grossly normal stomach. Normal caliber small bowel
with no small bowel wall thickening. Normal appendix. Normal large
bowel with no diverticulosis, large bowel wall thickening or
pericolonic fat stranding.

Vascular/Lymphatic: Normal caliber abdominal aorta. Patent portal,
splenic, hepatic and renal veins. No pathologically enlarged lymph
nodes in the abdomen or pelvis.

Reproductive: Status post hysterectomy, with no abnormal findings at
the vaginal cuff. No adnexal mass.

Other: No pneumoperitoneum, ascites or focal fluid collection.

Musculoskeletal: No aggressive appearing focal osseous lesions.
Moderate facet arthropathy in the lower lumbar spine bilaterally.
IMPRESSION: 1. No evidence of a primary malignancy or metastatic disease in the
chest, abdomen or pelvis.
2. No acute abnormality.
